# Patient Record
Sex: Female | Born: 1985 | Race: Black or African American | Hispanic: No | Marital: Single | Smoking: Never smoker
Health system: Southern US, Community
[De-identification: ages and names within clinical notes are randomized; demographics above are authoritative.]

## PROBLEM LIST (undated history)

## (undated) DIAGNOSIS — I10 Essential (primary) hypertension: Secondary | ICD-10-CM

---

## 2011-08-14 ENCOUNTER — Emergency Department (HOSPITAL_COMMUNITY): Payer: No Typology Code available for payment source

## 2011-08-14 ENCOUNTER — Other Ambulatory Visit: Payer: Self-pay

## 2011-08-14 ENCOUNTER — Encounter (HOSPITAL_COMMUNITY): Payer: Self-pay | Admitting: Emergency Medicine

## 2011-08-14 ENCOUNTER — Emergency Department (HOSPITAL_COMMUNITY)
Admission: EM | Admit: 2011-08-14 | Discharge: 2011-08-15 | Disposition: A | Discharge status: Home / Self Care | Payer: No Typology Code available for payment source | Attending: Emergency Medicine | Admitting: Emergency Medicine

## 2011-08-14 DIAGNOSIS — R4701 Aphasia: Secondary | ICD-10-CM | POA: Insufficient documentation

## 2011-08-14 DIAGNOSIS — G43109 Migraine with aura, not intractable, without status migrainosus: Secondary | ICD-10-CM | POA: Insufficient documentation

## 2011-08-14 DIAGNOSIS — R29898 Other symptoms and signs involving the musculoskeletal system: Secondary | ICD-10-CM | POA: Insufficient documentation

## 2011-08-14 MED ORDER — ONDANSETRON HCL 4 MG/2ML IJ SOLN
4.0000 mg | Freq: Once | INTRAMUSCULAR | Status: AC
  Administered 2011-08-14: 4 mg via INTRAVENOUS
  Filled 2011-08-14: qty 2

## 2011-08-14 MED ORDER — SODIUM CHLORIDE 0.9 % IV BOLUS (SEPSIS)
1000.0000 mL | INTRAVENOUS | Status: AC
  Administered 2011-08-14: 1000 mL via INTRAVENOUS

## 2011-08-14 MED ORDER — HYDROMORPHONE HCL PF 1 MG/ML IJ SOLN
1.0000 mg | Freq: Once | INTRAMUSCULAR | Status: AC
  Administered 2011-08-14: 1 mg via INTRAVENOUS
  Filled 2011-08-14: qty 1

## 2011-08-14 MED ORDER — SODIUM CHLORIDE 0.9 % IV SOLN: Freq: Once | INTRAVENOUS | Status: DC

## 2011-08-14 NOTE — ED Notes (Signed)
Patient complaining of slurred speech x 2 days. Also complaining of right lower extremity weakness x 2 days.

## 2011-08-14 NOTE — ED Provider Notes (Signed)
History  This chart was scribed for [employer]. Colon Branch, MD by Bennett Scrape. This patient was seen in room APA04/APA04 and the patient's care was started at 11:11PM.  CSN: [id number]  Arrival date & time 08/14/11  2253   First MD Initiated Contact with Patient 08/14/11 2311      Chief Complaint  Patient presents with  . Aphasia  . Extremity Weakness    The history is provided by the patient. No language interpreter was used.    Amanda Reyes is a 26 y.o. female with no h/o chronic medical conditions who presents to the Emergency Department complaining of sudden onset, gradually improving, intermittent body spasms in her right hand and right leg with slurred speech that occurred 2 days ago for approximately 3 hours. Pt states that she could feel her muscles pulling up into a bowed position. She states that she still feels weak. She c/o HA and states that the HA started right after the spasms begin on the left side with associated nausea and chills. She denies nausea and chills currently. She states that she doesn't get HAs on a regular basis and denies similar symptoms with prior HAs. She rates her HA currently a 5 out of 10 but states that the HA has become less severe since the onset. She reports taking ibuprofen. She states that her vision was blurred today. She denies vision change and slurred speech currently. She states that she came in because she is still feeling the pulling sensation and still feels like she is having occasional slurred speech.  She denies fever, emesis and diarrhea as associated symptoms. She denies smoking and alcohol use.  No PCP.  History reviewed. No pertinent past medical history.  History reviewed. No pertinent past surgical history.  History reviewed. No pertinent family history.  History  Substance Use Topics  . Smoking status: Never Smoker   . Smokeless tobacco: Not on file  . Alcohol Use: No     Review of Systems  A complete 10 system review of  systems was obtained and all systems are negative except as noted in the HPI and PMH.   Allergies  Penicillins  Home Medications  No current outpatient prescriptions on file.  Triage Vitals: BP 150/94  Pulse 94  Temp 98.2 F (36.8 C)  Resp 18  Ht 5\' 8"  (1.727 m)  Wt 320 lb (145.151 kg)  BMI 48.66 kg/m2  SpO2 100%  LMP 07/29/2011  Physical Exam  Nursing note and vitals reviewed. Constitutional: She is oriented to person, place, and time. She appears well-developed and well-nourished. No distress.  HENT:  Head: Normocephalic and atraumatic.  Eyes: Conjunctivae and EOM are normal. Pupils are equal, round, and reactive to light.  Neck: Normal range of motion. Neck supple. No tracheal deviation present.  Cardiovascular: Normal rate and regular rhythm.  Exam reveals no gallop and no friction rub.   No murmur heard. Pulmonary/Chest: Effort normal and breath sounds normal. No respiratory distress. She has no wheezes. She has no rales.  Abdominal: Soft. She exhibits no distension.  Musculoskeletal: Normal range of motion.       Strength is normal, slightly greater in the right than the left  Neurological: She is alert and oriented to person, place, and time. No cranial nerve deficit.       No facial asymmetry, no sensory loss, no slurred speech, no memory loss  Skin: Skin is warm and dry.  Psychiatric: She has a normal mood and affect. Her behavior is  normal.    ED Course  Procedures (including critical care time)  DIAGNOSTIC STUDIES: Oxygen Saturation is 100% on room air, normal by my interpretation.    COORDINATION OF CARE: 11:26PM-Discussed treatment plan which involves urinalysis with pt and pt agreed to plan. Results for orders placed during the hospital encounter of 08/14/11  URINALYSIS, ROUTINE W REFLEX MICROSCOPIC      Component Value Range   Color, Urine YELLOW  YELLOW    APPearance HAZY (*) CLEAR    Specific Gravity, Urine >1.030 (*) 1.005 - 1.030    pH 6.0  5.0 -  8.0    Glucose, UA NEGATIVE  NEGATIVE (mg/dL)   Hgb urine dipstick NEGATIVE  NEGATIVE    Bilirubin Urine NEGATIVE  NEGATIVE    Ketones, ur NEGATIVE  NEGATIVE (mg/dL)   Protein, ur NEGATIVE  NEGATIVE (mg/dL)   Urobilinogen, UA 0.2  0.0 - 1.0 (mg/dL)   Nitrite NEGATIVE  NEGATIVE    Leukocytes, UA NEGATIVE  NEGATIVE   PREGNANCY, URINE      Component Value Range   Preg Test, Ur NEGATIVE  NEGATIVE   CBC      Component Value Range   WBC 8.4  4.0 - 10.5 (K/uL)   RBC 4.49  3.87 - 5.11 (MIL/uL)   Hemoglobin 11.2 (*) 12.0 - 15.0 (g/dL)   HCT 16.1 (*) 09.6 - 46.0 (%)   MCV 78.0  78.0 - 100.0 (fL)   MCH 24.9 (*) 26.0 - 34.0 (pg)   MCHC 32.0  30.0 - 36.0 (g/dL)   RDW 04.5  40.9 - 81.1 (%)   Platelets 314  150 - 400 (K/uL)  DIFFERENTIAL      Component Value Range   Neutrophils Relative 57  43 - 77 (%)   Neutro Abs 4.8  1.7 - 7.7 (K/uL)   Lymphocytes Relative 38  12 - 46 (%)   Lymphs Abs 3.2  0.7 - 4.0 (K/uL)   Monocytes Relative 4  3 - 12 (%)   Monocytes Absolute 0.4  0.1 - 1.0 (K/uL)   Eosinophils Relative 1  0 - 5 (%)   Eosinophils Absolute 0.1  0.0 - 0.7 (K/uL)   Basophils Relative 0  0 - 1 (%)   Basophils Absolute 0.0  0.0 - 0.1 (K/uL)  BASIC METABOLIC PANEL      Component Value Range   Sodium 138  135 - 145 (mEq/L)   Potassium 3.7  3.5 - 5.1 (mEq/L)   Chloride 102  96 - 112 (mEq/L)   CO2 26  19 - 32 (mEq/L)   Glucose, Bld 103 (*) 70 - 99 (mg/dL)   BUN 12  6 - 23 (mg/dL)   Creatinine, Ser 9.14  0.50 - 1.10 (mg/dL)   Calcium 9.1  8.4 - 78.2 (mg/dL)   GFR calc non Af Amer >90  >90 (mL/min)   GFR calc Af Amer >90  >90 (mL/min)    Date: 08/15/2011   2316  Rate: 84  Rhythm: normal sinus rhythm  QRS Axis: normal  Intervals: normal  ST/T Wave abnormalities: normal  Conduction Disutrbances: none  Narrative Interpretation: unremarkable  Ct Head Wo Contrast  08/15/2011  *RADIOLOGY REPORT*  Clinical Data: Aphasia.  Extremity weakness.  CT HEAD WITHOUT CONTRAST  Technique:   Contiguous axial images were obtained from the base of the skull through the vertex without contrast.  Comparison: No priors.  Findings: No acute intracranial abnormalities.  Specifically, no acute intracranial hemorrhage, no definite acute/subacute cerebral ischemia, no focal  mass, mass effect, hydrocephalus or abnormal intra or extra-axial fluid collections.  No acute displaced skull fractures are identified.  Visualized paranasal sinuses and mastoids are well pneumatized.  IMPRESSION: 1.  No acute intracranial abnormalities.  The appearance of the brain is normal.  Original Report Authenticated By: Florencia Reasons, M.D.        MDM  Patient with headache and associated neurological symptoms c/w complicated migraine. CT negative for acute process. Labs unremarkable. Dx testing d/w pt and family.  Questions answered.  Verb understanding, agreeable to d/c home with outpt f/u. Pt feels improved after observation and/or treatment in ED.Pt stable in ED with no significant deterioration in condition.The patient appears reasonably screened and/or stabilized for discharge and I doubt any other medical condition or other [facility] requiring further screening, evaluation, or treatment in the ED at this time prior to discharge.  I personally performed the services described in this documentation, which was scribed in my presence. The recorded information has been reviewed and considered.   MDM Reviewed: nursing note and vitals Interpretation: CT scan and labs           [employer]. Colon Branch, MD 08/15/11 1610

## 2011-08-15 LAB — DIFFERENTIAL
Basophils Absolute: 0 10*3/uL (ref 0.0–0.1)
Basophils Relative: 0 % (ref 0–1)
Lymphocytes Relative: 38 % (ref 12–46)
Monocytes Absolute: 0.4 10*3/uL (ref 0.1–1.0)
Monocytes Relative: 4 % (ref 3–12)
Neutro Abs: 4.8 10*3/uL (ref 1.7–7.7)
Neutrophils Relative %: 57 % (ref 43–77)

## 2011-08-15 LAB — PREGNANCY, URINE: Preg Test, Ur: NEGATIVE

## 2011-08-15 LAB — CBC
HCT: 35 % — ABNORMAL LOW (ref 36.0–46.0)
Hemoglobin: 11.2 g/dL — ABNORMAL LOW (ref 12.0–15.0)
WBC: 8.4 10*3/uL (ref 4.0–10.5)

## 2011-08-15 LAB — URINALYSIS, ROUTINE W REFLEX MICROSCOPIC
Glucose, UA: NEGATIVE mg/dL
Hgb urine dipstick: NEGATIVE
Leukocytes, UA: NEGATIVE
Specific Gravity, Urine: >1.03 — ABNORMAL HIGH (ref 1.005–1.030)
pH: 6 (ref 5.0–8.0)

## 2011-08-15 LAB — BASIC METABOLIC PANEL
BUN: 12 mg/dL (ref 6–23)
CO2: 26 mEq/L (ref 19–32)
Chloride: 102 mEq/L (ref 96–112)
Creatinine, Ser: 0.85 mg/dL (ref 0.50–1.10)
GFR calc Af Amer: >90 mL/min (ref >90)
Potassium: 3.7 mEq/L (ref 3.5–5.1)

## 2011-08-15 MED ORDER — PROMETHAZINE HCL 25 MG PO TABS: 12.5000 mg | ORAL_TABLET | Freq: Four times a day (QID) | ORAL | Status: DC | PRN

## 2011-08-15 MED ORDER — HYDROCODONE-ACETAMINOPHEN 5-325 MG PO TABS: 1.0000 | ORAL_TABLET | ORAL | Status: AC | PRN

## 2011-08-15 NOTE — Discharge Instructions (Signed)
Drink lots of fluids. Use the pain  and nausea medicine as needed.   Migraine Headache A migraine headache is an intense, throbbing pain on one or both sides of your head. The exact cause of a migraine headache is not always known. A migraine may be caused when nerves in the brain become irritated and release chemicals that cause swelling within blood vessels, causing pain. Many migraine sufferers have a family history of migraines. Before you get a migraine you may or may not get an aura. An aura is a group of symptoms that can predict the beginning of a migraine. An aura may include:  Visual changes such as:   Flashing lights.   Bright spots or zig-zag lines.   Tunnel vision.   Feelings of numbness.   Trouble talking.   Muscle weakness.  SYMPTOMS  Pain on one or both sides of your head.   Pain that is pulsating or throbbing in nature.   Pain that is severe enough to prevent daily activities.   Pain that is aggravated by any daily physical activity.   Nausea (feeling sick to your stomach), vomiting, or both.   Pain with exposure to bright lights, loud noises, or activity.   General sensitivity to bright lights or loud noises.  MIGRAINE TRIGGERS Examples of triggers of migraine headaches include:   Alcohol.   Smoking.   Stress.   It may be related to menses (female menstruation).   Aged cheeses.   Foods or drinks that contain nitrates, glutamate, aspartame, or tyramine.   Lack of sleep.   Chocolate.   Caffeine.   Hunger.   Medications such as nitroglycerine (used to treat chest pain), birth control pills, estrogen, and some blood pressure medications.  DIAGNOSIS  A migraine headache is often diagnosed based on:  Symptoms.   Physical examination.   A computerized X-ray scan (computed tomography, CT) of your head.  TREATMENT  Medications can help prevent migraines if they are recurrent or should they become recurrent. Your caregiver can help you with  a medication or treatment program that will be helpful to you.   Lying down in a dark, quiet room may be helpful.   Keeping a headache diary may help you find a trend as to what may be triggering your headaches.  SEEK IMMEDIATE MEDICAL CARE IF:   You have confusion, personality changes or seizures.   You have headaches that wake you from sleep.   You have an increased frequency in your headaches.   You have a stiff neck.   You have a loss of vision.   You have muscle weakness.   You start losing your balance or have trouble walking.   You feel faint or pass out.  MAKE SURE YOU:   Understand these instructions.   Will watch your condition.   Will get help right away if you are not doing well or get worse.  Document Released: 02/26/2005 Document Revised: 02/15/2011 Document Reviewed: 10/12/2008 [facility] Patient Information 2012 [place], [place].

## 2012-08-01 ENCOUNTER — Inpatient Hospital Stay (HOSPITAL_COMMUNITY)
Admission: EM | Admit: 2012-08-01 | Discharge: 2012-08-03 | DRG: 445 | Disposition: A | Discharge status: Home / Self Care | Payer: No Typology Code available for payment source | Attending: Internal Medicine | Admitting: Internal Medicine

## 2012-08-01 ENCOUNTER — Emergency Department (HOSPITAL_COMMUNITY): Payer: No Typology Code available for payment source

## 2012-08-01 ENCOUNTER — Encounter (HOSPITAL_COMMUNITY): Payer: Self-pay | Admitting: *Deleted

## 2012-08-01 DIAGNOSIS — Z880 Allergy status to penicillin: Secondary | ICD-10-CM

## 2012-08-01 DIAGNOSIS — Z6841 Body Mass Index (BMI) 40.0 and over, adult: Secondary | ICD-10-CM

## 2012-08-01 DIAGNOSIS — E669 Obesity, unspecified: Secondary | ICD-10-CM

## 2012-08-01 DIAGNOSIS — K8020 Calculus of gallbladder without cholecystitis without obstruction: Principal | ICD-10-CM | POA: Diagnosis present

## 2012-08-01 DIAGNOSIS — K8050 Calculus of bile duct without cholangitis or cholecystitis without obstruction: Secondary | ICD-10-CM

## 2012-08-01 DIAGNOSIS — R7989 Other specified abnormal findings of blood chemistry: Secondary | ICD-10-CM | POA: Diagnosis present

## 2012-08-01 LAB — URINALYSIS, ROUTINE W REFLEX MICROSCOPIC
Nitrite: NEGATIVE
Protein, ur: 30 mg/dL — AB
Urobilinogen, UA: 4 mg/dL — ABNORMAL HIGH (ref 0.0–1.0)

## 2012-08-01 LAB — CBC
HCT: 36.6 % (ref 36.0–46.0)
Hemoglobin: 11.9 g/dL — ABNORMAL LOW (ref 12.0–15.0)
MCV: 78.9 fL (ref 78.0–100.0)
RDW: 15.5 % (ref 11.5–15.5)
WBC: 4.7 10*3/uL (ref 4.0–10.5)

## 2012-08-01 LAB — COMPREHENSIVE METABOLIC PANEL
Alkaline Phosphatase: 377 U/L — ABNORMAL HIGH (ref 39–117)
BUN: 9 mg/dL (ref 6–23)
Chloride: 100 mEq/L (ref 96–112)
Creatinine, Ser: 0.84 mg/dL (ref 0.50–1.10)
GFR calc Af Amer: >90 mL/min (ref >90)
GFR calc non Af Amer: >90 mL/min (ref >90)
Glucose, Bld: 100 mg/dL — ABNORMAL HIGH (ref 70–99)
Potassium: 3.9 mEq/L (ref 3.5–5.1)
Total Bilirubin: 2.8 mg/dL — ABNORMAL HIGH (ref 0.3–1.2)

## 2012-08-01 LAB — TROPONIN I: Troponin I: <0.3 ng/mL (ref <0.30)

## 2012-08-01 LAB — LIPASE, BLOOD: Lipase: 35 U/L (ref 11–59)

## 2012-08-01 LAB — URINE MICROSCOPIC-ADD ON

## 2012-08-01 MED ORDER — ONDANSETRON HCL 4 MG PO TABS
4.0000 mg | ORAL_TABLET | Freq: Four times a day (QID) | ORAL | Status: DC | PRN
  Filled 2012-08-01: qty 1

## 2012-08-01 MED ORDER — ONDANSETRON HCL 4 MG/2ML IJ SOLN
4.0000 mg | Freq: Four times a day (QID) | INTRAMUSCULAR | Status: DC | PRN
  Administered 2012-08-02 (×2): 4 mg via INTRAVENOUS
  Filled 2012-08-01 (×2): qty 2

## 2012-08-01 MED ORDER — SODIUM CHLORIDE 0.9 % IV BOLUS (SEPSIS)
1000.0000 mL | Freq: Once | INTRAVENOUS | Status: AC
  Administered 2012-08-01: 1000 mL via INTRAVENOUS

## 2012-08-01 MED ORDER — ONDANSETRON HCL 4 MG/2ML IJ SOLN
4.0000 mg | Freq: Once | INTRAMUSCULAR | Status: AC
  Administered 2012-08-01: 4 mg via INTRAVENOUS
  Filled 2012-08-01 (×2): qty 2

## 2012-08-01 MED ORDER — SODIUM CHLORIDE 0.9 % IV SOLN
INTRAVENOUS | Status: DC
  Administered 2012-08-02 (×3): via INTRAVENOUS

## 2012-08-01 MED ORDER — HYDROMORPHONE HCL PF 1 MG/ML IJ SOLN
1.0000 mg | Freq: Once | INTRAMUSCULAR | Status: AC
  Administered 2012-08-01: 1 mg via INTRAVENOUS
  Filled 2012-08-01 (×2): qty 1

## 2012-08-01 MED ORDER — MORPHINE SULFATE 2 MG/ML IJ SOLN
2.0000 mg | INTRAMUSCULAR | Status: DC | PRN
  Administered 2012-08-02 – 2012-08-03 (×3): 2 mg via INTRAVENOUS
  Filled 2012-08-01 (×3): qty 1

## 2012-08-01 MED ORDER — DEXTROSE 5 % IV SOLN
1.0000 g | INTRAVENOUS | Status: DC
  Administered 2012-08-01 – 2012-08-02 (×2): 1 g via INTRAVENOUS
  Filled 2012-08-01 (×3): qty 10

## 2012-08-01 MED ORDER — HEPARIN SODIUM (PORCINE) 5000 UNIT/ML IJ SOLN
5000.0000 [IU] | Freq: Three times a day (TID) | INTRAMUSCULAR | Status: DC
  Administered 2012-08-01: 5000 [IU] via SUBCUTANEOUS
  Filled 2012-08-01 (×2): qty 1

## 2012-08-01 NOTE — Consult Note (Signed)
Referring Provider: No ref. provider found Primary Care Physician:  No PCP Per Patient Primary Gastroenterologist:  Dr. Karilyn Cota  Reason for Consultation:  Possible choledocholithiasis.  HPI: Patient is 27 year old female who was admitted to Dr. Karilyn Cota service via emergency room where she presented earlier today with five-day history of subcostal pain radiating to the infrascapular region on the right side. Patient first experienced this pain in December 2013 when she was evaluated in emergency room at [facility] in [place]. Her LFTs are elevated. She says her ultrasound was negative for cholelithiasis. She did not followup with her primary care physician. She was fine until 07/27/2012 when she noted right supposed pain laterally radiating to right and for scapular region. Initially she thought it was gas. Since then pain has been vaccinated Kathrynn Running that she had severe pain last night. Yesterday she also noted her urine to be orange. She did not experience fever chills nausea vomiting or epigastric pain. She also denies melena or rectal bleeding or hematuria. On evaluation in emergency room she was noted to have elevated bilirubin and LFTs and ultrasound showed cholelithiasis mildly dilated bile duct measuring 9 mm. Dr. Lovell Sheehan of surgical service was consulted who requested GI consultation for ERCP prior to cholecystectomy. Patient does not take any medications on regular basis. She denies chronic heartburn or dysphagia. She is trying to lose weight. She says her peak weight last year was 350 pounds and now she's down to 330.     Prior to Admission medications   Not on File  Past medical history; Negative other than obesity.  Current Facility-Administered Medications  Medication Dose Route Frequency Provider Last Rate Last Dose  . 0.9 %  sodium chloride infusion   Intravenous Continuous Nimish C Gosrani, MD      . cefTRIAXone (ROCEPHIN) 1 g in dextrose 5 % 50 mL IVPB   1 g Intravenous Q24H Suzi Roots, MD 100 mL/hr at 08/01/12 1924 1 g at 08/01/12 1924  . morphine 2 MG/ML injection 2 mg  2 mg Intravenous Q4H PRN Nimish C Gosrani, MD      . ondansetron (ZOFRAN) tablet 4 mg  4 mg Oral Q6H PRN Nimish C Gosrani, MD       Or  . ondansetron (ZOFRAN) injection 4 mg  4 mg Intravenous Q6H PRN Wilson Singer, MD        Allergies as of 08/01/2012 - Review Complete 08/01/2012  Allergen Reaction Noted  . Penicillins Hives 08/14/2011   family history; Both parents are in good health and so is one brother.    History   Social History  . Marital Status: Single    Spouse Name: N/A    Number of Children: N/A  . Years of Education: N/A   Occupational History  . Not on file.   Social History Main Topics  . Smoking status: Never Smoker   . Smokeless tobacco: Not on file  . Alcohol Use: No  . Drug Use: No  . Sexually Active: No   Other Topics Concern  . Not on file   Social History Narrative  . No narrative on file    Review of Systems: See HPI, otherwise normal ROS  Physical Exam: Temp:  [97.4 F (36.3 C)-97.9 F (36.6 C)] 97.9 F (36.6 C) (05/23 2024) Pulse Rate:  [72-80] 72 (05/23 2024) Resp:  [16-20] 20 (05/23 2024) BP: (136-154)/(84-109) 136/84 mmHg (05/23 2024) SpO2:  [97 %-100 %] 97 % (05/23 2024) Weight:  [230 lb (104.327  kg)-349 lb 13.9 oz (158.7 kg)] 349 lb 13.9 oz (158.7 kg) (05/23 2024)   Pleasant well-developed obese African American female who is in no acute distress. Conjunctiva is pink sclerae does not appear to be icteric. Oropharyngeal mucosa is normal. No neck masses or thyromegaly are noted. Cardiac exam with regular rhythm normal S1 and S2. No murmur or gallop noted. Lungs are clear to auscultation. Abdomen is obese but soft and nontender without organomegaly or masses. Peripheral edema or clubbing noted.    Lab Results:  Recent Labs  08/01/12 1400  WBC 4.7  HGB 11.9*  HCT 36.6  PLT 298   BMET  Recent  Labs  08/01/12 1400  NA 138  K 3.9  CL 100  CO2 28  GLUCOSE 100*  BUN 9  CREATININE 0.84  CALCIUM 9.1   LFT  Recent Labs  08/01/12 1400  PROT 7.5  ALBUMIN 3.6  AST 395*  ALT 657*  ALKPHOS 377*  BILITOT 2.8*   PT/INR No results found for this basename: LABPROT, INR,  in the last 72 hours Hepatitis Panel No results found for this basename: HEPBSAG, HCVAB, HEPAIGM, HEPBIGM,  in the last 72 hours  Studies/Results: Dg Chest 2 View  08/01/2012   *RADIOLOGY REPORT*  Clinical Data: Chest pain through to her back for 2-day  CHEST - 2 VIEW  Comparison: It is none  Findings: Enlargement of cardiac silhouette. Mediastinal contours and pulmonary vascularity normal. Minimal subsegmental atelectasis at right base. Lungs otherwise clear. No pleural effusion or pneumothorax. Bones unremarkable.  IMPRESSION: Enlargement of cardiac silhouette. Minimal right basilar atelectasis.   Original Report Authenticated By: Ulyses Southward, M.D.   US Abdomen Limited Ruq  08/01/2012   *RADIOLOGY REPORT*  Clinical Data:  Gallstones, right upper quadrant pain  LIMITED ABDOMINAL ULTRASOUND - RIGHT UPPER QUADRANT  Comparison:  None.  Findings:  Gallbladder:  Multiple echogenic foci with posterior acoustic shadowing are noted within the gallbladder consistent with cholelithiasis.  No gallbladder wall thickening or pericholecystic fluid.  Per the sonographer the sonographic Murphy's sign was negative.  Common bile duct:  The common bile duct is mildly dilated at 9 mm in caliber.  Liver:  Unremarkable sonographic appearance of the liver.  IMPRESSION:  1.  Cholelithiasis without sonographic findings to suggest acute cholecystitis.  2.  Borderline enlarged common bile duct at 9 mm in diameter.  No choledocholithiasis seen within the imaged portions.  The distal common bile duct is not well seen secondary to obscuring bowel gas.                    Original Report Authenticated By: Malachy Moan, M.D.     Assessment; Patient is 27 year old female who presents with subcostal and infrascapular pain of 5 days' duration. She has elevated bilirubin and transaminases. Ultrasound reveals cholelithiasis and dilated bile duct measuring 9 mm. Patient's acute symptoms appear to be secondary to choledocholithiasis. She will need ERCP to clear her duct of stones prior to laparoscopic cholecystectomy.  Recommendations; ERCP with biliary sphincterotomy and stone extraction to be performed in a.m. Pancreatic stenting only if CBD cannulation is difficult. Will need to hold heparin and use mechanical VTE prophylaxis. She will not be able to go back on heparin or aspirin for 72 hours after procedure. I reviewed procedure and risks with the patient in detail and she is agreeable. We'll also obtain baseline INR and repeat lab in a.m.   LOS: 0 days   Kailene Steinhart U  08/01/2012, 9:56 PM

## 2012-08-01 NOTE — H&P (Signed)
[facility] History and Physical  Amanda Reyes WUJ:[id number] DOB: May 24, 1985 DOA: 08/01/2012  Referring physician: Dr Denton Lank PCP: No PCP Per Patient    Chief Complaint: Right-sided abdominal pain.  HPI: Amanda Reyes is a 27 y.o. female complains of right-sided abdominal pain which started approximately 4 days ago. The pain has been fairly intermittent, radiates to the right back. She has not had any significant nausea with it. There's been no fever. When she presented to the emergency room, she had elevated liver function tests and ultrasound showed the presence of gallstones with dilated common bile duct. She is now being referred for remission.   Review of Systems:  Apart from history of present illness, other systems negative.  History reviewed. No pertinent past medical history. History reviewed. No pertinent past surgical history. Social History:  She lives alone, she is single. She works at a [employer]. She does not smoke cigarettes. She does not drink alcohol. She is fully independent.  Allergies  Allergen Reactions  . Penicillins Hives    No family history on file. there is no family history of gallbladder disease.  Prior to Admission medications   Not on File   Physical Exam: Filed Vitals:   08/01/12 1233  BP: 154/109  Pulse: 80  Temp: 97.4 F (36.3 C)  TempSrc: Oral  Resp: 16  Height: 5\' 7"  (1.702 m)  Weight: 104.327 kg (230 lb)  SpO2: 100%     General:  She looks systemically well. She is not toxic or septic. She is obese.  Eyes: No pallor. No jaundice.  ENT: No abnormalities.  Neck: No lymphadenopathy.  Cardiovascular: Heart sounds are present and normal without murmurs or added sounds.  Respiratory: Lung fields are clear.  Abdomen: Soft, nontender, in particular she is nontender in the right upper quadrant.  Skin: No rash.  Musculoskeletal: No acute joint abnormalities.  Psychiatric: Appropriate affect.  Neurologic: Alert and  orientated without any focal neurological signs.  Labs on Admission:  Basic Metabolic Panel:  Recent Labs Lab 08/01/12 1400  NA 138  K 3.9  CL 100  CO2 28  GLUCOSE 100*  BUN 9  CREATININE 0.84  CALCIUM 9.1   Liver Function Tests:  Recent Labs Lab 08/01/12 1400  AST 395*  ALT 657*  ALKPHOS 377*  BILITOT 2.8*  PROT 7.5  ALBUMIN 3.6    Recent Labs Lab 08/01/12 1400  LIPASE 35    CBC:  Recent Labs Lab 08/01/12 1400  WBC 4.7  HGB 11.9*  HCT 36.6  MCV 78.9  PLT 298   Cardiac Enzymes:  Recent Labs Lab 08/01/12 1400  TROPONINI <0.30     Radiological Exams on Admission: Dg Chest 2 View  08/01/2012   *RADIOLOGY REPORT*  Clinical Data: Chest pain through to her back for 2-day  CHEST - 2 VIEW  Comparison: It is none  Findings: Enlargement of cardiac silhouette. Mediastinal contours and pulmonary vascularity normal. Minimal subsegmental atelectasis at right base. Lungs otherwise clear. No pleural effusion or pneumothorax. Bones unremarkable.  IMPRESSION: Enlargement of cardiac silhouette. Minimal right basilar atelectasis.   Original Report Authenticated By: Ulyses Southward, M.D.   US Abdomen Limited Ruq  08/01/2012   *RADIOLOGY REPORT*  Clinical Data:  Gallstones, right upper quadrant pain  LIMITED ABDOMINAL ULTRASOUND - RIGHT UPPER QUADRANT  Comparison:  None.  Findings:  Gallbladder:  Multiple echogenic foci with posterior acoustic shadowing are noted within the gallbladder consistent with cholelithiasis.  No gallbladder wall thickening or pericholecystic fluid.  Per  the sonographer the sonographic Murphy's sign was negative.  Common bile duct:  The common bile duct is mildly dilated at 9 mm in caliber.  Liver:  Unremarkable sonographic appearance of the liver.  IMPRESSION:  1.  Cholelithiasis without sonographic findings to suggest acute cholecystitis.  2.  Borderline enlarged common bile duct at 9 mm in diameter.  No choledocholithiasis seen within the imaged portions.   The distal common bile duct is not well seen secondary to obscuring bowel gas.                    Original Report Authenticated By: Malachy Moan, M.D.      Assessment/Plan   1. Cholelithiasis, acute attack. No clinical or radiological evidence of cholecystitis. 2. Obesity.  Plan: 1. Admit to medical floor. 2. Analgesia as required. 3. Clear liquid diet. Intravenous fluids. 4. Surgical and gastroenterological consultations. Further recommendations will depend on patient's hospital progress.  Code Status: Full code.   Family Communication: Discussed plan with patient at the bedside.   Disposition Plan: Home when medically stable.   Time spent: 30 minutes.  Wilson Singer [facility] Pager [phone].  If 7PM-7AM, please contact night-coverage [email or web address] Password [facility] 08/01/2012, 4:34 PM     \

## 2012-08-01 NOTE — ED Provider Notes (Addendum)
History     CSN: [id number]  Arrival date & time 08/01/12  1215   First MD Initiated Contact with Patient 08/01/12 1318      Chief Complaint  Patient presents with  . Flank Pain  . Chest Pain    (Consider location/radiation/quality/duration/timing/severity/associated sxs/prior treatment) HPI Pt c/oruq/costal margin area pain for the past 3-4 days. Constant. Dull. Occasionally radiates to back/mid back. Not pleuritic. At rest. No specific exacerbating or alleviating factors. No associate sob, unusual doe, fatigue, nv, or diaphoresis. No cough or uri c/o. No fever or chills.  Denies hx same pain. No hx gallstones, pud or pancreatitis. No recent immobility, trauma, surgery, travel, leg pain/swelling, cough or hemoptysis, sob, ocp use, tobacco, or hx dvt or pe. Pt denies trauma to area.     History reviewed. No pertinent past medical history.  History reviewed. No pertinent past surgical history.  No family history on file.  History  Substance Use Topics  . Smoking status: Never Smoker   . Smokeless tobacco: Not on file  . Alcohol Use: No    OB History   Grav Para Term Preterm Abortions TAB SAB Ect Mult Living                  Review of Systems  Constitutional: Negative for fever and chills.  HENT: Negative for neck pain.   Eyes: Negative for redness.  Respiratory: Negative for cough and shortness of breath.   Cardiovascular: Negative for chest pain and leg swelling.  Gastrointestinal: Positive for abdominal pain. Negative for vomiting, diarrhea and constipation.  Genitourinary: Negative for dysuria, hematuria and flank pain.  Musculoskeletal: Negative for back pain.  Skin: Negative for rash.  Neurological: Negative for headaches.  Hematological: Does not bruise/bleed easily.  Psychiatric/Behavioral: Negative for confusion.    Allergies  Penicillins  Home Medications  No current outpatient prescriptions on file.  BP 154/109  Pulse 80  Temp(Src) 97.4 F (36.3  C) (Oral)  Resp 16  Ht 5\' 7"  (1.702 m)  Wt 230 lb (104.327 kg)  BMI 36.01 kg/m2  SpO2 100%  LMP 08/01/2012  Physical Exam  Nursing note and vitals reviewed. Constitutional: She is oriented to person, place, and time. She appears well-developed and well-nourished. No distress.  HENT:  Mouth/Throat: Oropharynx is clear and moist.  Eyes: Conjunctivae are normal. No scleral icterus.  Neck: Neck supple. No tracheal deviation present.  Cardiovascular: Normal rate, regular rhythm, normal heart sounds and intact distal pulses.   Pulmonary/Chest: Effort normal and breath sounds normal. No respiratory distress. She exhibits tenderness.  Abdominal: Soft. Normal appearance and bowel sounds are normal. She exhibits no distension and no mass. There is no rebound and no guarding.  Morbidly obese. Mild ruq tenderness.   Genitourinary:  No cva tenderness  Musculoskeletal: She exhibits no edema and no tenderness.  Neurological: She is alert and oriented to person, place, and time.  Skin: Skin is warm and dry. No rash noted. She is not diaphoretic.  Psychiatric: She has a normal mood and affect.    ED Course  Procedures (including critical care time)   Results for orders placed during the hospital encounter of 08/01/12  CBC      Result Value Range   WBC 4.7  4.0 - 10.5 K/uL   RBC 4.64  3.87 - 5.11 MIL/uL   Hemoglobin 11.9 (*) 12.0 - 15.0 g/dL   HCT 40.9  81.1 - 91.4 %   MCV 78.9  78.0 - 100.0 fL  MCH 25.6 (*) 26.0 - 34.0 pg   MCHC 32.5  30.0 - 36.0 g/dL   RDW 16.1  09.6 - 04.5 %   Platelets 298  150 - 400 K/uL  COMPREHENSIVE METABOLIC PANEL      Result Value Range   Sodium 138  135 - 145 mEq/L   Potassium 3.9  3.5 - 5.1 mEq/L   Chloride 100  96 - 112 mEq/L   CO2 28  19 - 32 mEq/L   Glucose, Bld 100 (*) 70 - 99 mg/dL   BUN 9  6 - 23 mg/dL   Creatinine, Ser 4.09  0.50 - 1.10 mg/dL   Calcium 9.1  8.4 - 81.1 mg/dL   Total Protein 7.5  6.0 - 8.3 g/dL   Albumin 3.6  3.5 - 5.2 g/dL    AST 914 (*) 0 - 37 U/L   ALT 657 (*) 0 - 35 U/L   Alkaline Phosphatase 377 (*) 39 - 117 U/L   Total Bilirubin 2.8 (*) 0.3 - 1.2 mg/dL   GFR calc non Af Amer >90  >90 mL/min   GFR calc Af Amer >90  >90 mL/min  LIPASE, BLOOD      Result Value Range   Lipase 35  11 - 59 U/L  TROPONIN I      Result Value Range   Troponin I <0.30  <0.30 ng/mL  URINALYSIS, ROUTINE W REFLEX MICROSCOPIC      Result Value Range   Color, Urine ORANGE (*) YELLOW   APPearance CLEAR  CLEAR   Specific Gravity, Urine 1.020  1.005 - 1.030   pH 7.5  5.0 - 8.0   Glucose, UA 100 (*) NEGATIVE mg/dL   Hgb urine dipstick LARGE (*) NEGATIVE   Bilirubin Urine LARGE (*) NEGATIVE   Ketones, ur TRACE (*) NEGATIVE mg/dL   Protein, ur 30 (*) NEGATIVE mg/dL   Urobilinogen, UA 4.0 (*) 0.0 - 1.0 mg/dL   Nitrite NEGATIVE  NEGATIVE   Leukocytes, UA NEGATIVE  NEGATIVE  PREGNANCY, URINE      Result Value Range   Preg Test, Ur NEGATIVE  NEGATIVE  URINE MICROSCOPIC-ADD ON      Result Value Range   Squamous Epithelial / LPF RARE  RARE   RBC / HPF TOO NUMEROUS TO COUNT  <3 RBC/hpf   Dg Chest 2 View  08/01/2012   *RADIOLOGY REPORT*  Clinical Data: Chest pain through to her back for 2-day  CHEST - 2 VIEW  Comparison: It is none  Findings: Enlargement of cardiac silhouette. Mediastinal contours and pulmonary vascularity normal. Minimal subsegmental atelectasis at right base. Lungs otherwise clear. No pleural effusion or pneumothorax. Bones unremarkable.  IMPRESSION: Enlargement of cardiac silhouette. Minimal right basilar atelectasis.   Original Report Authenticated By: Ulyses Southward, M.D.   US Abdomen Limited Ruq  08/01/2012   *RADIOLOGY REPORT*  Clinical Data:  Gallstones, right upper quadrant pain  LIMITED ABDOMINAL ULTRASOUND - RIGHT UPPER QUADRANT  Comparison:  None.  Findings:  Gallbladder:  Multiple echogenic foci with posterior acoustic shadowing are noted within the gallbladder consistent with cholelithiasis.  No gallbladder wall  thickening or pericholecystic fluid.  Per the sonographer the sonographic Murphy's sign was negative.  Common bile duct:  The common bile duct is mildly dilated at 9 mm in caliber.  Liver:  Unremarkable sonographic appearance of the liver.  IMPRESSION:  1.  Cholelithiasis without sonographic findings to suggest acute cholecystitis.  2.  Borderline enlarged common bile duct at 9 mm in  diameter.  No choledocholithiasis seen within the imaged portions.  The distal common bile duct is not well seen secondary to obscuring bowel gas.                    Original Report Authenticated By: Malachy Moan, M.D.      MDM  Labs.   Date: 08/01/2012  Rate: 74  Rhythm: normal sinus rhythm  QRS Axis: normal  Intervals: normal  ST/T Wave abnormalities: normal  Conduction Disutrbances:none  Narrative Interpretation:   Old EKG Reviewed: unchanged  Pt drove self to ed, narcotic pain med held.    [place] w gallstones and dil duct, lfts elev c/w bil obstruction.  Discussed w gen surgery, Dr Lovell Sheehan, he will consult, but requests gi be called re possible ercp.  discused w GI, Dr Karilyn Cota, he will see, requests pt be started on ceph abx, tentatively plans to do ercp tomorrow morning.   Discussed w hospitalist, Dr Karilyn Cota, he will admit.   Dilaudid 1 mg iv.   Recheck pain improved. Mild ruq tenderness.    Suzi Roots, MD 08/01/12 1723  Suzi Roots, MD 08/01/12 [phone]

## 2012-08-01 NOTE — ED Notes (Signed)
Pt states right flank pain began Sunday. States chest tightness began yesterday. States when flank begins to hurt, tightness of chest occurs also. NAD.

## 2012-08-01 NOTE — ED Notes (Signed)
GI MD at bedside. Unable to obtain IV access. Hospitalist paged to inform him and was instructed to call on-call surgeon for possible central line placement.

## 2012-08-01 NOTE — ED Notes (Signed)
Was told to call ICU nurses to try. They stated they were unable due to only being two of them. Currently calling OR to see if PICC line nurse is still in the building.

## 2012-08-01 NOTE — ED Notes (Signed)
EMD currently at bedside attempting IV with [place]. Report given to Ashely, RN

## 2012-08-02 ENCOUNTER — Encounter (HOSPITAL_COMMUNITY)
Admission: EM | Disposition: A | Discharge status: Home / Self Care | Payer: Self-pay | Source: Home / Self Care | Attending: Internal Medicine

## 2012-08-02 ENCOUNTER — Inpatient Hospital Stay (HOSPITAL_COMMUNITY): Payer: No Typology Code available for payment source

## 2012-08-02 ENCOUNTER — Encounter (HOSPITAL_COMMUNITY): Payer: Self-pay | Admitting: Anesthesiology

## 2012-08-02 ENCOUNTER — Inpatient Hospital Stay (HOSPITAL_COMMUNITY): Payer: No Typology Code available for payment source | Admitting: Anesthesiology

## 2012-08-02 HISTORY — PX: STONE EXTRACTION WITH BASKET: SHX5318

## 2012-08-02 HISTORY — PX: ERCP: SHX5425

## 2012-08-02 HISTORY — PX: PANCREATIC STENT PLACEMENT: SHX5539

## 2012-08-02 HISTORY — PX: SPHINCTEROTOMY: SHX5544

## 2012-08-02 LAB — PROTIME-INR: INR: 1 (ref 0.00–1.49)

## 2012-08-02 LAB — COMPREHENSIVE METABOLIC PANEL
ALT: 530 U/L — ABNORMAL HIGH (ref 0–35)
Calcium: 8.8 mg/dL (ref 8.4–10.5)
Creatinine, Ser: 0.79 mg/dL (ref 0.50–1.10)
GFR calc Af Amer: >90 mL/min (ref >90)
Glucose, Bld: 96 mg/dL (ref 70–99)
Sodium: 137 mEq/L (ref 135–145)
Total Protein: 6.4 g/dL (ref 6.0–8.3)

## 2012-08-02 LAB — CBC
HCT: 33.8 % — ABNORMAL LOW (ref 36.0–46.0)
Hemoglobin: 11 g/dL — ABNORMAL LOW (ref 12.0–15.0)
MCHC: 32.5 g/dL (ref 30.0–36.0)
RBC: 4.28 MIL/uL (ref 3.87–5.11)
WBC: 4.9 10*3/uL (ref 4.0–10.5)

## 2012-08-02 LAB — HEMOGLOBIN A1C: Mean Plasma Glucose: 103 mg/dL (ref <117)

## 2012-08-02 SURGERY — ERCP, WITH INTERVENTION IF INDICATED
Anesthesia: General

## 2012-08-02 SURGERY — CANCELLED PROCEDURE

## 2012-08-02 MED ORDER — ACETAMINOPHEN 325 MG PO TABS: 650.0000 mg | ORAL_TABLET | Freq: Four times a day (QID) | ORAL | Status: DC | PRN

## 2012-08-02 MED ORDER — ROCURONIUM BROMIDE 100 MG/10ML IV SOLN
INTRAVENOUS | Status: DC | PRN
  Administered 2012-08-02: 25 mg via INTRAVENOUS
  Administered 2012-08-02: 5 mg via INTRAVENOUS

## 2012-08-02 MED ORDER — LACTATED RINGERS IV SOLN
INTRAVENOUS | Status: DC | PRN
  Administered 2012-08-02: 09:00:00 via INTRAVENOUS

## 2012-08-02 MED ORDER — SODIUM CHLORIDE 0.9 % IV SOLN
INTRAVENOUS | Status: DC | PRN
  Administered 2012-08-02: 10:00:00

## 2012-08-02 MED ORDER — GLYCOPYRROLATE 0.2 MG/ML IJ SOLN
INTRAMUSCULAR | Status: AC
  Filled 2012-08-02: qty 1

## 2012-08-02 MED ORDER — INDOMETHACIN 50 MG RE SUPP
100.0000 mg | Freq: Once | RECTAL | Status: AC
  Administered 2012-08-02: 100 mg via RECTAL

## 2012-08-02 MED ORDER — GLYCOPYRROLATE 0.2 MG/ML IJ SOLN
INTRAMUSCULAR | Status: AC
  Filled 2012-08-02: qty 3

## 2012-08-02 MED ORDER — PROPOFOL 10 MG/ML IV BOLUS
INTRAVENOUS | Status: DC | PRN
  Administered 2012-08-02: 200 mg via INTRAVENOUS

## 2012-08-02 MED ORDER — ROCURONIUM BROMIDE 50 MG/5ML IV SOLN
INTRAVENOUS | Status: AC
  Filled 2012-08-02: qty 1

## 2012-08-02 MED ORDER — LIDOCAINE HCL (CARDIAC) 20 MG/ML IV SOLN
INTRAVENOUS | Status: DC | PRN
  Administered 2012-08-02: 40 mg via INTRAVENOUS

## 2012-08-02 MED ORDER — ONDANSETRON HCL 4 MG/2ML IJ SOLN
INTRAMUSCULAR | Status: DC | PRN
  Administered 2012-08-02: 4 mg via INTRAVENOUS

## 2012-08-02 MED ORDER — LIDOCAINE HCL (PF) 1 % IJ SOLN
INTRAMUSCULAR | Status: AC
  Filled 2012-08-02: qty 5

## 2012-08-02 MED ORDER — ONDANSETRON HCL 4 MG/2ML IJ SOLN
INTRAMUSCULAR | Status: AC
  Filled 2012-08-02: qty 2

## 2012-08-02 MED ORDER — INDOMETHACIN 50 MG RE SUPP
RECTAL | Status: AC
  Filled 2012-08-02: qty 1

## 2012-08-02 MED ORDER — GLYCOPYRROLATE 0.2 MG/ML IJ SOLN
INTRAMUSCULAR | Status: DC | PRN
  Administered 2012-08-02: 0.2 mg via INTRAVENOUS
  Administered 2012-08-02: .4 mg via INTRAVENOUS

## 2012-08-02 MED ORDER — GLUCAGON HCL (RDNA) 1 MG IJ SOLR
INTRAMUSCULAR | Status: DC | PRN
  Administered 2012-08-02: 0.25 mg via INTRAVENOUS

## 2012-08-02 MED ORDER — FENTANYL CITRATE 0.05 MG/ML IJ SOLN
INTRAMUSCULAR | Status: AC
  Filled 2012-08-02: qty 5

## 2012-08-02 MED ORDER — STERILE WATER FOR IRRIGATION IR SOLN
Status: DC | PRN
  Administered 2012-08-02: 10:00:00

## 2012-08-02 MED ORDER — MIDAZOLAM HCL 2 MG/2ML IJ SOLN
INTRAMUSCULAR | Status: AC
  Filled 2012-08-02: qty 2

## 2012-08-02 MED ORDER — SUCCINYLCHOLINE CHLORIDE 20 MG/ML IJ SOLN
INTRAMUSCULAR | Status: DC | PRN
Start: 2012-08-02 — End: 2012-08-02
  Administered 2012-08-02: 150 mg via INTRAVENOUS

## 2012-08-02 MED ORDER — SUCCINYLCHOLINE CHLORIDE 20 MG/ML IJ SOLN
INTRAMUSCULAR | Status: AC
  Filled 2012-08-02: qty 1

## 2012-08-02 MED ORDER — PROPOFOL 10 MG/ML IV EMUL
INTRAVENOUS | Status: AC
  Filled 2012-08-02: qty 20

## 2012-08-02 MED ORDER — PHENOL 1.4 % MT LIQD
1.0000 | OROMUCOSAL | Status: DC | PRN
  Administered 2012-08-02: 1 via OROMUCOSAL
  Filled 2012-08-02: qty 177

## 2012-08-02 MED ORDER — NEOSTIGMINE METHYLSULFATE 1 MG/ML IJ SOLN
INTRAMUSCULAR | Status: DC | PRN
  Administered 2012-08-02: 2 mg via INTRAVENOUS

## 2012-08-02 MED ORDER — FENTANYL CITRATE 0.05 MG/ML IJ SOLN
INTRAMUSCULAR | Status: DC | PRN
  Administered 2012-08-02 (×2): 50 ug via INTRAVENOUS

## 2012-08-02 MED ORDER — MIDAZOLAM HCL 5 MG/5ML IJ SOLN
INTRAMUSCULAR | Status: DC | PRN
  Administered 2012-08-02: 2 mg via INTRAVENOUS

## 2012-08-02 SURGICAL SUPPLY — 25 items
BAG HAMPER (MISCELLANEOUS) ×2 IMPLANT
BALLN RETRIEVAL 12X15 (BALLOONS) ×2 IMPLANT
BASKET TRAPEZOID 3X6 (MISCELLANEOUS) IMPLANT
BASKET TRAPEZOID LITHO 2.5X5 (MISCELLANEOUS) ×2 IMPLANT
DEVICE INFLATION ENCORE 26 (MISCELLANEOUS) IMPLANT
DEVICE LOCKING W-BIOPSY CAP (MISCELLANEOUS) ×2 IMPLANT
GUIDEWIRE HYDRA JAGWIRE .35 (WIRE) ×2 IMPLANT
GUIDEWIRE JAG HINI 025X260CM (WIRE) IMPLANT
KIT ROOM TURNOVER APOR (KITS) ×2 IMPLANT
LUBRICANT JELLY 4.5OZ STERILE (MISCELLANEOUS) IMPLANT
NEEDLE HYPO 18GX1.5 BLUNT FILL (NEEDLE) IMPLANT
PAD ARMBOARD 7.5X6 YLW CONV (MISCELLANEOUS) ×2 IMPLANT
PATHFINDER 450CM 0.18 (STENTS) IMPLANT
POSITIONER HEAD 8X9X4 ADT (SOFTGOODS) IMPLANT
SNARE ROTATE MED OVAL 20MM (MISCELLANEOUS) IMPLANT
SPHINCTEROTOME AUTOTOME .25 (MISCELLANEOUS) ×2 IMPLANT
SPHINCTEROTOME HYDRATOME 44 (MISCELLANEOUS) ×2 IMPLANT
SPONGE GAUZE 4X4 12PLY (GAUZE/BANDAGES/DRESSINGS) ×2 IMPLANT
SYR 3ML LL SCALE MARK (SYRINGE) IMPLANT
SYR 50ML LL SCALE MARK (SYRINGE) ×4 IMPLANT
SYSTEM CONTINUOUS INJECTION (MISCELLANEOUS) ×2 IMPLANT
TUBING ENDO SMARTCAP PENTAX (MISCELLANEOUS) ×2 IMPLANT
WALLSTENT METAL COVERED 10X60 (STENTS) IMPLANT
WALLSTENT METAL COVERED 10X80 (STENTS) IMPLANT
WATER STERILE IRR 1000ML POUR (IV SOLUTION) ×4 IMPLANT

## 2012-08-02 SURGICAL SUPPLY — 24 items
BAG HAMPER (MISCELLANEOUS) IMPLANT
BALLN RETRIEVAL 12X15 (BALLOONS) IMPLANT
BASKET TRAPEZOID 3X6 (MISCELLANEOUS) IMPLANT
DEVICE INFLATION ENCORE 26 (MISCELLANEOUS) IMPLANT
DEVICE LOCKING W-BIOPSY CAP (MISCELLANEOUS) IMPLANT
GUIDEWIRE HYDRA JAGWIRE .35 (WIRE) IMPLANT
GUIDEWIRE JAG HINI 025X260CM (WIRE) IMPLANT
KIT ROOM TURNOVER APOR (KITS) IMPLANT
LUBRICANT JELLY 4.5OZ STERILE (MISCELLANEOUS) IMPLANT
NEEDLE HYPO 18GX1.5 BLUNT FILL (NEEDLE) IMPLANT
PAD ARMBOARD 7.5X6 YLW CONV (MISCELLANEOUS) IMPLANT
PATHFINDER 450CM 0.18 (STENTS) IMPLANT
POSITIONER HEAD 8X9X4 ADT (SOFTGOODS) IMPLANT
SNARE ROTATE MED OVAL 20MM (MISCELLANEOUS) IMPLANT
SPHINCTEROTOME AUTOTOME .25 (MISCELLANEOUS) IMPLANT
SPHINCTEROTOME HYDRATOME 44 (MISCELLANEOUS) IMPLANT
SPONGE GAUZE 4X4 12PLY (GAUZE/BANDAGES/DRESSINGS) IMPLANT
SYR 3ML LL SCALE MARK (SYRINGE) IMPLANT
SYR 50ML LL SCALE MARK (SYRINGE) IMPLANT
SYSTEM CONTINUOUS INJECTION (MISCELLANEOUS) IMPLANT
TUBING ENDO SMARTCAP PENTAX (MISCELLANEOUS) IMPLANT
WALLSTENT METAL COVERED 10X60 (STENTS) IMPLANT
WALLSTENT METAL COVERED 10X80 (STENTS) IMPLANT
WATER STERILE IRR 1000ML POUR (IV SOLUTION) IMPLANT

## 2012-08-02 NOTE — Anesthesia Preprocedure Evaluation (Addendum)
Anesthesia Evaluation  Patient identified by MRN, date of birth, ID band Patient awake    Reviewed: Allergy & Precautions, H&P , NPO status , Patient's Chart, lab work & pertinent test results  History of Anesthesia Complications Negative for: history of anesthetic complications  Airway       Dental   Pulmonary          Cardiovascular negative cardio ROS      Neuro/Psych negative neurological ROS     GI/Hepatic Nausea, but no vomiting with present problem   Endo/Other  Morbid obesity  Renal/GU negative Renal ROS     Musculoskeletal   Abdominal   Peds  Hematology   Anesthesia Other Findings   Reproductive/Obstetrics                          Anesthesia Physical Anesthesia Plan  ASA: II and emergent  Anesthesia Plan: General   Post-op Pain Management:    Induction: Intravenous, Rapid sequence and Cricoid pressure planned  Airway Management Planned: Oral ETT  Additional Equipment:   Intra-op Plan:   Post-operative Plan: Extubation in OR  Informed Consent: I have reviewed the patients History and Physical, chart, labs and discussed the procedure including the risks, benefits and alternatives for the proposed anesthesia with the patient or authorized representative who has indicated his/her understanding and acceptance.   Dental advisory given  Plan Discussed with: Anesthesiologist  Anesthesia Plan Comments: (Telephone consult with Dr. Jayme Cloud agreeable with plan.)        Anesthesia Quick Evaluation

## 2012-08-02 NOTE — Progress Notes (Signed)
Patient has no complaints. She does not have abdominal pain at this time. Sclera is mildly icteric. Bilirubin is up to 3.7, AB 361, AST 228 and ALT 530 INR is 1 Will proceed with ERCP. Patient is agreeable.

## 2012-08-02 NOTE — Progress Notes (Signed)
     Subjective: The patient's pain is somewhat improved today. She has been seen by gastroenterology and is due for ERCP today. She has also been seen by surgery and  will have her cholecystectomy in the next few days .           Physical Exam: Blood pressure 139/95, pulse 79, temperature 98 F (36.7 C), temperature source Oral, resp. rate 20, height 5\' 7"  (1.702 m), weight 158.7 kg (349 lb 13.9 oz), last menstrual period 08/01/2012, SpO2 98.00%. She looks systemically well. She is not toxic or septic. Abdomen is soft and nontender. There is no respiratory distress.   Investigations:  No results found for this or any previous visit (from the past 240 hour(s)).   Basic Metabolic Panel:  Recent Labs  40/98/11 1400 08/02/12 0639  NA 138 137  K 3.9 3.8  CL 100 101  CO2 28 26  GLUCOSE 100* 96  BUN 9 11  CREATININE 0.84 0.79  CALCIUM 9.1 8.8   Liver Function Tests:  Recent Labs  08/01/12 1400 08/02/12 0639  AST 395* 228*  ALT 657* 530*  ALKPHOS 377* 361*  BILITOT 2.8* 3.7*  PROT 7.5 6.4  ALBUMIN 3.6 3.0*     CBC:  Recent Labs  08/01/12 1400 08/02/12 0639  WBC 4.7 4.9  HGB 11.9* 11.0*  HCT 36.6 33.8*  MCV 78.9 79.0  PLT 298 290    Dg Chest 2 View  08/01/2012   *RADIOLOGY REPORT*  Clinical Data: Chest pain through to her back for 2-day  CHEST - 2 VIEW  Comparison: It is none  Findings: Enlargement of cardiac silhouette. Mediastinal contours and pulmonary vascularity normal. Minimal subsegmental atelectasis at right base. Lungs otherwise clear. No pleural effusion or pneumothorax. Bones unremarkable.  IMPRESSION: Enlargement of cardiac silhouette. Minimal right basilar atelectasis.   Original Report Authenticated By: Ulyses Southward, M.D.   US Abdomen Limited Ruq  08/01/2012   *RADIOLOGY REPORT*  Clinical Data:  Gallstones, right upper quadrant pain  LIMITED ABDOMINAL ULTRASOUND - RIGHT UPPER QUADRANT  Comparison:  None.  Findings:  Gallbladder:  Multiple  echogenic foci with posterior acoustic shadowing are noted within the gallbladder consistent with cholelithiasis.  No gallbladder wall thickening or pericholecystic fluid.  Per the sonographer the sonographic Murphy's sign was negative.  Common bile duct:  The common bile duct is mildly dilated at 9 mm in caliber.  Liver:  Unremarkable sonographic appearance of the liver.  IMPRESSION:  1.  Cholelithiasis without sonographic findings to suggest acute cholecystitis.  2.  Borderline enlarged common bile duct at 9 mm in diameter.  No choledocholithiasis seen within the imaged portions.  The distal common bile duct is not well seen secondary to obscuring bowel gas.                    Original Report Authenticated By: Malachy Moan, M.D.      Medications: I have reviewed the patient's current medications.  Impression: 1. Symptomatic cholelithiasis. 2. Obesity.     Plan: 1. Await ERCP today.     LOS: 1 day   Wilson Singer Pager [phone]  08/02/2012, 8:05 AM

## 2012-08-02 NOTE — Brief Op Note (Signed)
08/01/2012 - 08/02/2012  10:31 AM  PATIENT:  Amanda Reyes  27 y.o. female  PRE-OPERATIVE DIAGNOSIS:  CBD Stones  POST-OPERATIVE DIAGNOSIS:  * No post-op diagnosis entered *  PROCEDURE:  Procedure(s): ENDOSCOPIC RETROGRADE CHOLANGIOPANCREATOGRAPHY (ERCP) (N/A) SPHINCTEROTOMY (N/A) STONE EXTRACTION WITH BASKET (N/A) PANCREATIC STENT PLACEMENT (N/A)  SURGEON:  Surgeon(s) and Role:    * Malissa Hippo, MD - Primary  ERCP findings; food debris noted in the stomach. Patent pylorus. Normal ampulla of Vater. Pancreatic duct initially cannulated with  035 guidewire. Selective cannulation of CBD somewhat difficult. Therefore single pigtail 5 French 5 cm long pancreatic stent left in place. CBD and CHD 7-8 mm. Patent cystic duct. Biliary sphincterotomy performed with escape of 2 stones. Patient tolerated procedure well

## 2012-08-02 NOTE — Op Note (Signed)
NAMESHI, Amanda Reyes               ACCOUNT NO.:  [id number]  MEDICAL RECORD NO.:  [id number]  LOCATION:  [facility]                          FACILITY:  [facility]  PHYSICIAN:  Lionel December, M.D.    DATE OF BIRTH:  05-Jun-1985  DATE OF PROCEDURE:  08/02/2012 DATE OF DISCHARGE:                              OPERATIVE REPORT   PROCEDURE:  ERCP with biliary sphincterotomy and stone extraction and pancreatic stent placement.  INDICATION:  The patient is a 27 year old female who presents with subcostal pain, elevated bilirubin, transaminases, cholelithiasis, and mildly dilated bile duct.  She is suspected to have choledocholithiasis. Procedures were reviewed with the patient.  Informed consent was obtained.  MEDICATIONS FOR SEDATION/ANESTHESIA:  Please see anesthesia records for details.  FINDINGS:  Procedure was performed in the OR.  Once the patient was placed under general endotracheal anesthesia, she was turned into semiprone position.  Therapeutic Pentax video duodenoscope was passed through oropharynx without any difficulty into esophagus and stomach where some food debris was noted.  Pyloric channel was patent.  Scope was passed.  Second part of duodenum and the ampulla appeared to be normal.  Cannulation attempted with RX 44 Autotome and 0.035 Hydra Jagwire.  Pancreatic duct was initially cannulated with guidewire, but no contrast was injected.  Mucosa at the ampulla was somewhat friable, could not be easily cannulate the bile duct.  Therefore guidewire was left in the pancreatic duct to facilitate selective entry of the bile duct which was then accomplished.  CBD was 7 to 8 mm.  As sphincterotomy was performed, two stones came out spontaneously and subsequently passed Tonia basket and balloon, and no other stones or filling defects identified.  Single pigtail 5-French 5 cm long pancreatic stent was also placed for prophylactic purposes.  Endoscope was withdrawn.  The patient  tolerated the procedure well.  FINAL DIAGNOSES: 1. Choledocholithiasis. 2. Two stones removed following biliary sphincterotomy. 3. Pancreatic stent placed for prophylactic purposes.  RECOMMENDATIONS: 1. Indomethacin 100 mg suppository rectum x1 2. Clear liquids today.  We will advance diet in a.m. 3. We will repeat a CBC, comprehensive chemistry panel, serum amylase     in a.m. 4. She does well and cholecystectomy is not planned until later she     should be able to go home tomorrow.          ______________________________ Lionel December, M.D.     NR/MEDQ  D:  08/02/2012  T:  08/02/2012  Job:  [id number]

## 2012-08-02 NOTE — Transfer of Care (Signed)
Immediate Anesthesia Transfer of Care Note  Patient: Amanda Reyes  Procedure(s) Performed: Procedure(s): ENDOSCOPIC RETROGRADE CHOLANGIOPANCREATOGRAPHY (ERCP) with sphincterotomy and stone extraction; Possible pancreatic stenting.   (N/A)  Patient Location: PACU  Anesthesia Type:General  Level of Consciousness: awake, alert  and oriented  Airway & Oxygen Therapy: Patient Spontanous Breathing and Patient connected to face mask oxygen  Post-op Assessment: Report given to PACU RN  Post vital signs: Reviewed and stable  Complications: No apparent anesthesia complications

## 2012-08-02 NOTE — Consult Note (Signed)
Reason for Consult: Cholelithiasis, probable choledocholithiasis Referring Physician: [facility], Dr. Retta Diones is an 27 y.o. female.  HPI: Patient is a 27 year old morbidly obese black female who presented emergency room with worsening right upper quadrant abdominal pain and nausea. Ultrasound of gallbladder revealed cholelithiasis with a dilated common bile duct. She was also noted to have elevated liver enzyme test. She was bit by the hospitalist. She did undergo an ERCP by Dr. Karilyn Cota.  History reviewed. No pertinent past medical history.  History reviewed. No pertinent past surgical history.  No family history on file.  Social History:  reports that she has never smoked. She does not have any smokeless tobacco history on file. She reports that she does not drink alcohol or use illicit drugs.  Allergies:  Allergies  Allergen Reactions  . Penicillins Hives    Medications: I have reviewed the patient's current medications.  Results for orders placed during the hospital encounter of 08/01/12 (from the past 48 hour(s))  CBC     Status: Abnormal   Collection Time    08/01/12  2:00 PM      Result Value Range   WBC 4.7  4.0 - 10.5 K/uL   RBC 4.64  3.87 - 5.11 MIL/uL   Hemoglobin 11.9 (*) 12.0 - 15.0 g/dL   HCT 13.0  86.5 - 78.4 %   MCV 78.9  78.0 - 100.0 fL   MCH 25.6 (*) 26.0 - 34.0 pg   MCHC 32.5  30.0 - 36.0 g/dL   RDW 69.6  29.5 - 28.4 %   Platelets 298  150 - 400 K/uL  COMPREHENSIVE METABOLIC PANEL     Status: Abnormal   Collection Time    08/01/12  2:00 PM      Result Value Range   Sodium 138  135 - 145 mEq/L   Potassium 3.9  3.5 - 5.1 mEq/L   Chloride 100  96 - 112 mEq/L   CO2 28  19 - 32 mEq/L   Glucose, Bld 100 (*) 70 - 99 mg/dL   BUN 9  6 - 23 mg/dL   Creatinine, Ser 1.32  0.50 - 1.10 mg/dL   Calcium 9.1  8.4 - 44.0 mg/dL   Total Protein 7.5  6.0 - 8.3 g/dL   Albumin 3.6  3.5 - 5.2 g/dL   AST 102 (*) 0 - 37 U/L   ALT 657 (*) 0 - 35 U/L    Alkaline Phosphatase 377 (*) 39 - 117 U/L   Total Bilirubin 2.8 (*) 0.3 - 1.2 mg/dL   GFR calc non Af Amer >90  >90 mL/min   GFR calc Af Amer >90  >90 mL/min   Comment:            The eGFR has been calculated     using the CKD EPI equation.     This calculation has not been     validated in all clinical     situations.     eGFR's persistently     <90 mL/min signify     possible Chronic Kidney Disease.  LIPASE, BLOOD     Status: None   Collection Time    08/01/12  2:00 PM      Result Value Range   Lipase 35  11 - 59 U/L  TROPONIN I     Status: None   Collection Time    08/01/12  2:00 PM      Result Value Range   Troponin I <  0.30  <0.30 ng/mL   Comment:            Due to the release kinetics of cTnI,     a negative result within the first hours     of the onset of symptoms does not rule out     myocardial infarction with certainty.     If myocardial infarction is still suspected,     repeat the test at appropriate intervals.  TSH     Status: None   Collection Time    08/01/12  2:00 PM      Result Value Range   TSH 1.948  0.350 - 4.500 uIU/mL  URINALYSIS, ROUTINE W REFLEX MICROSCOPIC     Status: Abnormal   Collection Time    08/01/12  2:01 PM      Result Value Range   Color, Urine ORANGE (*) YELLOW   Comment: BIOCHEMICALS MAY BE AFFECTED BY COLOR   APPearance CLEAR  CLEAR   Specific Gravity, Urine 1.020  1.005 - 1.030   pH 7.5  5.0 - 8.0   Glucose, UA 100 (*) NEGATIVE mg/dL   Hgb urine dipstick LARGE (*) NEGATIVE   Bilirubin Urine LARGE (*) NEGATIVE   Ketones, ur TRACE (*) NEGATIVE mg/dL   Protein, ur 30 (*) NEGATIVE mg/dL   Urobilinogen, UA 4.0 (*) 0.0 - 1.0 mg/dL   Nitrite NEGATIVE  NEGATIVE   Leukocytes, UA NEGATIVE  NEGATIVE  PREGNANCY, URINE     Status: None   Collection Time    08/01/12  2:01 PM      Result Value Range   Preg Test, Ur NEGATIVE  NEGATIVE   Comment:            THE SENSITIVITY OF THIS     METHODOLOGY IS >20 mIU/mL.  URINE MICROSCOPIC-ADD ON      Status: None   Collection Time    08/01/12  2:01 PM      Result Value Range   Squamous Epithelial / LPF RARE  RARE   RBC / HPF TOO NUMEROUS TO COUNT  <3 RBC/hpf  PROTIME-INR     Status: None   Collection Time    08/02/12  6:32 AM      Result Value Range   Prothrombin Time 13.1  11.6 - 15.2 seconds   INR 1.00  0.00 - 1.49  COMPREHENSIVE METABOLIC PANEL     Status: Abnormal   Collection Time    08/02/12  6:39 AM      Result Value Range   Sodium 137  135 - 145 mEq/L   Potassium 3.8  3.5 - 5.1 mEq/L   Chloride 101  96 - 112 mEq/L   CO2 26  19 - 32 mEq/L   Glucose, Bld 96  70 - 99 mg/dL   BUN 11  6 - 23 mg/dL   Creatinine, Ser 1.61  0.50 - 1.10 mg/dL   Calcium 8.8  8.4 - 09.6 mg/dL   Total Protein 6.4  6.0 - 8.3 g/dL   Albumin 3.0 (*) 3.5 - 5.2 g/dL   AST 045 (*) 0 - 37 U/L   ALT 530 (*) 0 - 35 U/L   Alkaline Phosphatase 361 (*) 39 - 117 U/L   Total Bilirubin 3.7 (*) 0.3 - 1.2 mg/dL   GFR calc non Af Amer >90  >90 mL/min   GFR calc Af Amer >90  >90 mL/min   Comment:            The  eGFR has been calculated     using the CKD EPI equation.     This calculation has not been     validated in all clinical     situations.     eGFR's persistently     <90 mL/min signify     possible Chronic Kidney Disease.  CBC     Status: Abnormal   Collection Time    08/02/12  6:39 AM      Result Value Range   WBC 4.9  4.0 - 10.5 K/uL   RBC 4.28  3.87 - 5.11 MIL/uL   Hemoglobin 11.0 (*) 12.0 - 15.0 g/dL   HCT 21.3 (*) 08.6 - 57.8 %   MCV 79.0  78.0 - 100.0 fL   MCH 25.7 (*) 26.0 - 34.0 pg   MCHC 32.5  30.0 - 36.0 g/dL   RDW 46.9 (*) 62.9 - 52.8 %   Platelets 290  150 - 400 K/uL    Dg Chest 2 View  08/01/2012   *RADIOLOGY REPORT*  Clinical Data: Chest pain through to her back for 2-day  CHEST - 2 VIEW  Comparison: It is none  Findings: Enlargement of cardiac silhouette. Mediastinal contours and pulmonary vascularity normal. Minimal subsegmental atelectasis at right base. Lungs  otherwise clear. No pleural effusion or pneumothorax. Bones unremarkable.  IMPRESSION: Enlargement of cardiac silhouette. Minimal right basilar atelectasis.   Original Report Authenticated By: Ulyses Southward, M.D.   US Abdomen Limited Ruq  08/01/2012   *RADIOLOGY REPORT*  Clinical Data:  Gallstones, right upper quadrant pain  LIMITED ABDOMINAL ULTRASOUND - RIGHT UPPER QUADRANT  Comparison:  None.  Findings:  Gallbladder:  Multiple echogenic foci with posterior acoustic shadowing are noted within the gallbladder consistent with cholelithiasis.  No gallbladder wall thickening or pericholecystic fluid.  Per the sonographer the sonographic Murphy's sign was negative.  Common bile duct:  The common bile duct is mildly dilated at 9 mm in caliber.  Liver:  Unremarkable sonographic appearance of the liver.  IMPRESSION:  1.  Cholelithiasis without sonographic findings to suggest acute cholecystitis.  2.  Borderline enlarged common bile duct at 9 mm in diameter.  No choledocholithiasis seen within the imaged portions.  The distal common bile duct is not well seen secondary to obscuring bowel gas.                    Original Report Authenticated By: Malachy Moan, M.D.    ROS: See chart Blood pressure 139/95, pulse 79, temperature 98 F (36.7 C), temperature source Oral, resp. rate 20, height 5\' 7"  (1.702 m), weight 158.7 kg (349 lb 13.9 oz), last menstrual period 08/01/2012, SpO2 98.00%. Physical Exam: Pleasant black female in no acute distress. Abdomen soft with mild tenderness in right upper quadrant to deep palpation. No hepatosplenomegaly, masses, or hernias identified.  Assessment/Plan: Impression: Cholelithiasis, choledocholithiasis probable Plan: Agree with proceeding with ERCP. She will need a laparoscopic cholecystectomy once her liver enzyme tests have improved. Will follow with you.  Kenyetta Wimbish A 08/02/2012, 7:57 AM

## 2012-08-02 NOTE — Anesthesia Postprocedure Evaluation (Addendum)
  Anesthesia Post-op Note  Patient: Amanda Reyes  Procedure(s) Performed: Procedure(s): ENDOSCOPIC RETROGRADE CHOLANGIOPANCREATOGRAPHY (ERCP) with sphincterotomy and stone extraction; Possible pancreatic stenting.   (N/A)  Patient Location: PACU  Anesthesia Type:General  Level of Consciousness: awake, alert  and oriented  Airway and Oxygen Therapy: Patient Spontanous Breathing and Patient connected to face mask oxygen  Post-op Pain: none  Post-op Assessment: Post-op Vital signs reviewed, Patient's Cardiovascular Status Stable, Respiratory Function Stable, Patent Airway and No signs of Nausea or vomiting  Post-op Vital Signs: Reviewed and stable  Complications: No apparent anesthesia complications 08/02/12, 1900  VSS, patient going home tom. Surgical consult scheduled.  No aparent anesthesia complications.

## 2012-08-02 NOTE — Anesthesia Procedure Notes (Signed)
Procedure Name: Intubation Date/Time: 08/02/2012 9:24 AM Performed by: Glynn Octave E Pre-anesthesia Checklist: Patient identified, Patient being monitored, Timeout performed, Emergency Drugs available and Suction available Patient Re-evaluated:Patient Re-evaluated prior to inductionOxygen Delivery Method: Circle System Utilized Preoxygenation: Pre-oxygenation with 100% oxygen Intubation Type: IV induction Ventilation: Mask ventilation without difficulty Laryngoscope Size: Mac and 3 Grade View: Grade II Tube type: Oral Tube size: 7.0 mm Number of attempts: 1 Airway Equipment and Method: stylet Placement Confirmation: ETT inserted through vocal cords under direct vision,  positive ETCO2 and breath sounds checked- equal and bilateral Secured at: 21 cm Tube secured with: Tape Dental Injury: Teeth and Oropharynx as per pre-operative assessment

## 2012-08-03 LAB — COMPREHENSIVE METABOLIC PANEL
BUN: 7 mg/dL (ref 6–23)
CO2: 27 mEq/L (ref 19–32)
Calcium: 7.9 mg/dL — ABNORMAL LOW (ref 8.4–10.5)
Chloride: 108 mEq/L (ref 96–112)
Creatinine, Ser: 0.78 mg/dL (ref 0.50–1.10)
GFR calc Af Amer: >90 mL/min (ref >90)
GFR calc non Af Amer: >90 mL/min (ref >90)
Glucose, Bld: 99 mg/dL (ref 70–99)
Total Bilirubin: 0.9 mg/dL (ref 0.3–1.2)

## 2012-08-03 LAB — CBC
HCT: 32.7 % — ABNORMAL LOW (ref 36.0–46.0)
Hemoglobin: 10.5 g/dL — ABNORMAL LOW (ref 12.0–15.0)
MCH: 25.4 pg — ABNORMAL LOW (ref 26.0–34.0)
MCV: 79.2 fL (ref 78.0–100.0)
RBC: 4.13 MIL/uL (ref 3.87–5.11)
WBC: 8 10*3/uL (ref 4.0–10.5)

## 2012-08-03 LAB — AMYLASE: Amylase: 275 U/L — ABNORMAL HIGH (ref 0–105)

## 2012-08-03 MED ORDER — ONDANSETRON HCL 4 MG PO TABS: 4.0000 mg | ORAL_TABLET | Freq: Four times a day (QID) | ORAL | Status: DC | PRN

## 2012-08-03 MED ORDER — OXYCODONE HCL 5 MG PO TABS: 5.0000 mg | ORAL_TABLET | ORAL | Status: DC | PRN

## 2012-08-03 NOTE — Discharge Summary (Signed)
Physician Discharge Summary  Alex Mcmanigal XBJ:[id number] DOB: 1985/08/03 DOA: 08/01/2012  PCP: No PCP Per Patient  Admit date: 08/01/2012 Discharge date: 08/03/2012  Time spent: Greater than 30 minutes  Recommendations for Outpatient Follow-up:  1. Follow with Dr. Lovell Sheehan, surgery for cholecystectomy.   Discharge Diagnoses:  1. Acute cholelithiasis, status post ERCP and removal of 2 stones. 2. Obesity.   Discharge Condition: Stable and improved.  Diet recommendation: Low-fat diet.  Filed Weights   08/01/12 1233 08/01/12 2024  Weight: 104.327 kg (230 lb) 158.7 kg (349 lb 13.9 oz)    History of present illness:  This 27 year old lady presents to the hospital with symptoms of right-sided abdominal pain. Please see initial history as outlined below: HPI: Carlos Quackenbush is a 27 y.o. female complains of right-sided abdominal pain which started approximately 4 days ago. The pain has been fairly intermittent, radiates to the right back. She has not had any significant nausea with it. There's been no fever. When she presented to the emergency room, she had elevated liver function tests and ultrasound showed the presence of gallstones with dilated common bile duct. She is now being referred for admission.    ,   Hospital Course:  Patient was admitted and given intravenous fluids and analgesia as required. She was seen by gastroenterology and surgery. Dr Karilyn Cota, gastroenterology performed ERCP on her yesterday and was able to retrieve 2 stones. Her symptoms are resolved. She was also seen by Dr. Lovell Sheehan, surgery, who will arrange for her to have surgery in the next few days to remove her gallbladder. Today she is well, has tolerated a diet and is without pain. She is stable for discharge.  Procedures:  ERCP by Dr. Karilyn Cota, gastroenterology.   Consultations:  Dr Karilyn Cota, gastroenterology.  Dr. Lovell Sheehan, surgery.  Discharge Exam: Filed Vitals:   08/02/12 1206 08/02/12 1425 08/02/12 2106  08/03/12 0429  BP: 136/90 146/85 141/90 124/84  Pulse: 67 73 78 84  Temp: 97.3 F (36.3 C) 97.8 F (36.6 C) 98.4 F (36.9 C) 98.5 F (36.9 C)  TempSrc: Oral Oral Oral Oral  Resp: 18 19 20 20   Height:      Weight:      SpO2: 94% 93% 99% 97%    General: She looks systemically well. She is obese. She does not appear to be in pain now. Cardiovascular: Heart sounds are present and normal without murmurs or added sounds. Respiratory: Lung fields are clear. Abdomen is soft and nontender now. She is alert and orientated  Discharge Instructions  Discharge Orders   Future Orders Complete By Expires     Diet - low sodium heart healthy  As directed     Increase activity slowly  As directed         Medication List    TAKE these medications       ondansetron 4 MG tablet  Commonly known as:  ZOFRAN  Take 1 tablet (4 mg total) by mouth every 6 (six) hours as needed for nausea.     oxyCODONE 5 MG immediate release tablet  Commonly known as:  ROXICODONE  Take 1 tablet (5 mg total) by mouth every 4 (four) hours as needed for pain.       Allergies  Allergen Reactions  . Penicillins Hives       Follow-up Information   Follow up with Dalia Heading, MD. Call in 2 days.   Contact information:   [id number] [place] [address] [phone]  The results of significant diagnostics from this hospitalization (including imaging, microbiology, ancillary and laboratory) are listed below for reference.    Significant Diagnostic Studies: Dg Chest 2 View  08/01/2012   *RADIOLOGY REPORT*  Clinical Data: Chest pain through to her back for 2-day  CHEST - 2 VIEW  Comparison: It is none  Findings: Enlargement of cardiac silhouette. Mediastinal contours and pulmonary vascularity normal. Minimal subsegmental atelectasis at right base. Lungs otherwise clear. No pleural effusion or pneumothorax. Bones unremarkable.  IMPRESSION: Enlargement of cardiac silhouette. Minimal right  basilar atelectasis.   Original Report Authenticated By: Ulyses Southward, M.D.   Dg Ercp With Sphincterotomy  08/02/2012   *RADIOLOGY REPORT*  Clinical Data: Bile duct stones, basket removal of several stones, sphincterotomy, pancreatic duct stent placement.  Obesity.  ERCP  Comparison: Abdominal ultrasound - 08/01/2012  Fluoroscopy time:  3 minutes, 6 seconds  Findings:  Seven spot intraoperative fluoroscopic images of the right upper quadrant during ERCP are provided for review. Examination is degraded secondary to coned field of view and under penetration, presumably secondary to reported history of patient obesity.  Images demonstrate an ERCP probe overlying the right upper abdominal quadrant.  There is selective cannulation of the common bile duct as well as presumably the pancreatic duct (image labeled #8).  There is contrast opacification of the common bile duct which appears nondilated.  There is an apparent ill-defined filling defect within the distal aspect of the common bile duct.  Subsequent images demonstrate insufflation of a sphincterotomy balloon with the distal aspect of the common bile duct (image labeled #14).  IMPRESSION: ERCP with sphincterotomy as above.   Original Report Authenticated By: Tacey Ruiz, MD   US Abdomen Limited Ruq  08/01/2012   *RADIOLOGY REPORT*  Clinical Data:  Gallstones, right upper quadrant pain  LIMITED ABDOMINAL ULTRASOUND - RIGHT UPPER QUADRANT  Comparison:  None.  Findings:  Gallbladder:  Multiple echogenic foci with posterior acoustic shadowing are noted within the gallbladder consistent with cholelithiasis.  No gallbladder wall thickening or pericholecystic fluid.  Per the sonographer the sonographic Murphy's sign was negative.  Common bile duct:  The common bile duct is mildly dilated at 9 mm in caliber.  Liver:  Unremarkable sonographic appearance of the liver.  IMPRESSION:  1.  Cholelithiasis without sonographic findings to suggest acute cholecystitis.  2.   Borderline enlarged common bile duct at 9 mm in diameter.  No choledocholithiasis seen within the imaged portions.  The distal common bile duct is not well seen secondary to obscuring bowel gas.                    Original Report Authenticated By: Malachy Moan, M.D.        Labs: Basic Metabolic Panel:  Recent Labs Lab 08/01/12 1400 08/02/12 0639 08/03/12 0627  NA 138 137 140  K 3.9 3.8 3.4*  CL 100 101 108  CO2 28 26 27   GLUCOSE 100* 96 99  BUN 9 11 7   CREATININE 0.84 0.79 0.78  CALCIUM 9.1 8.8 7.9*   Liver Function Tests:  Recent Labs Lab 08/01/12 1400 08/02/12 0639 08/03/12 0627  AST 395* 228* 79*  ALT 657* 530* 327*  ALKPHOS 377* 361* 280*  BILITOT 2.8* 3.7* 0.9  PROT 7.5 6.4 6.2  ALBUMIN 3.6 3.0* 2.6*    Recent Labs Lab 08/01/12 1400 08/03/12 0627  LIPASE 35  --   AMYLASE  --  275*    CBC:  Recent Labs Lab 08/01/12 1400  08/02/12 0639 08/03/12 0627  WBC 4.7 4.9 8.0  HGB 11.9* 11.0* 10.5*  HCT 36.6 33.8* 32.7*  MCV 78.9 79.0 79.2  PLT 298 290 275   Cardiac Enzymes:  Recent Labs Lab 08/01/12 1400  TROPONINI <0.30        Signed:  Ozias Dicenzo C  [facility] 08/03/2012, 10:28 AM

## 2012-08-03 NOTE — Progress Notes (Signed)
Subjective; Patient feels much better. She has noted mild intermittent cramping and epigastric region. She had no nausea or pain with meal. Objective; BP 124/84  Pulse 84  Temp(Src) 98.5 F (36.9 C) (Oral)  Resp 20  Ht 5\' 7"  (1.702 m)  Wt 349 lb 13.9 oz (158.7 kg)  BMI 54.78 kg/m2  SpO2 97%  LMP 08/01/2012 Abdomen is obese but soft and nontender without organomegaly or masses. Lab data; WBC 8.0, H&H 10.5 and 32.7 platelet count 275K Bilirubin 0.9, AP 280, AST 79, ALT 327. Serum amylase 275. Assessment; Patient is doing well following ERCP with sphincterotomy and stone extraction. Bilirubin and transaminases are down. Mild elevation of serum amylase to procedure but no other features suggest pancreatitis. Patient has mild anemia and she will need to be on iron and she is recovered from her acute illness. Dr. Lovell Sheehan is planning cholecystectomy next week. Will do a KUB done to document that she has passed pancreatic stent. Agree with discharge planning.

## 2012-08-03 NOTE — H&P (Signed)
Amanda Reyes is an 28 y.o. female.   Chief Complaint: Cholecystitis, cholelithiasis HPI: Patient is a 27 year old morbidly obese black female who presented to [facility] with right upper quadrant abdominal pain and nausea. She was done an ultrasound the gallbladder to have cholelithiasis and possible choledocholithiasis. Her liver enzyme tests were elevated as well as her total bilirubin. Dr. Karilyn Cota of gastroenterology was consulted and she underwent an ERCP with multiple stone extraction from the common bile duct on 08/02/2012. She tolerated the procedure well. Her postoperative course had been unremarkable. She is now presenting tape and hospital for laparoscopic cholecystectomy.  History reviewed. No pertinent past medical history.  History reviewed. No pertinent past surgical history.  No family history on file. Social History:  reports that she has never smoked. She does not have any smokeless tobacco history on file. She reports that she does not drink alcohol or use illicit drugs.  Allergies:  Allergies  Allergen Reactions  . Penicillins Hives    No prescriptions prior to admission    Results for orders placed during the hospital encounter of 08/01/12 (from the past 48 hour(s))  CBC     Status: Abnormal   Collection Time    08/01/12  2:00 PM      Result Value Range   WBC 4.7  4.0 - 10.5 K/uL   RBC 4.64  3.87 - 5.11 MIL/uL   Hemoglobin 11.9 (*) 12.0 - 15.0 g/dL   HCT 62.1  30.8 - 65.7 %   MCV 78.9  78.0 - 100.0 fL   MCH 25.6 (*) 26.0 - 34.0 pg   MCHC 32.5  30.0 - 36.0 g/dL   RDW 84.6  96.2 - 95.2 %   Platelets 298  150 - 400 K/uL  COMPREHENSIVE METABOLIC PANEL     Status: Abnormal   Collection Time    08/01/12  2:00 PM      Result Value Range   Sodium 138  135 - 145 mEq/L   Potassium 3.9  3.5 - 5.1 mEq/L   Chloride 100  96 - 112 mEq/L   CO2 28  19 - 32 mEq/L   Glucose, Bld 100 (*) 70 - 99 mg/dL   BUN 9  6 - 23 mg/dL   Creatinine, Ser 8.41  0.50 - 1.10 mg/dL    Calcium 9.1  8.4 - 32.4 mg/dL   Total Protein 7.5  6.0 - 8.3 g/dL   Albumin 3.6  3.5 - 5.2 g/dL   AST 401 (*) 0 - 37 U/L   ALT 657 (*) 0 - 35 U/L   Alkaline Phosphatase 377 (*) 39 - 117 U/L   Total Bilirubin 2.8 (*) 0.3 - 1.2 mg/dL   GFR calc non Af Amer >90  >90 mL/min   GFR calc Af Amer >90  >90 mL/min   Comment:            The eGFR has been calculated     using the CKD EPI equation.     This calculation has not been     validated in all clinical     situations.     eGFR's persistently     <90 mL/min signify     possible Chronic Kidney Disease.  LIPASE, BLOOD     Status: None   Collection Time    08/01/12  2:00 PM      Result Value Range   Lipase 35  11 - 59 U/L  TROPONIN I     Status:  None   Collection Time    08/01/12  2:00 PM      Result Value Range   Troponin I <0.30  <0.30 ng/mL   Comment:            Due to the release kinetics of cTnI,     a negative result within the first hours     of the onset of symptoms does not rule out     myocardial infarction with certainty.     If myocardial infarction is still suspected,     repeat the test at appropriate intervals.  TSH     Status: None   Collection Time    08/01/12  2:00 PM      Result Value Range   TSH 1.948  0.350 - 4.500 uIU/mL  HEMOGLOBIN A1C     Status: None   Collection Time    08/01/12  2:00 PM      Result Value Range   Hemoglobin A1C 5.2  <5.7 %   Comment: (NOTE)                                                                               According to the ADA Clinical Practice Recommendations for 2011, when     HbA1c is used as a screening test:      >=6.5%   Diagnostic of Diabetes Mellitus               (if abnormal result is confirmed)     5.7-6.4%   Increased risk of developing Diabetes Mellitus     References:Diagnosis and Classification of Diabetes Mellitus,Diabetes     Care,2011,34(Suppl 1):S62-S69 and Standards of Medical Care in             Diabetes - 2011,Diabetes Care,2011,34 (Suppl  1):S11-S61.     CORRECTED ON 05/24 AT 2136: PREVIOUSLY REPORTED AS 5.2 Reference range: <5.7   Mean Plasma Glucose 103  <117 mg/dL  URINALYSIS, ROUTINE W REFLEX MICROSCOPIC     Status: Abnormal   Collection Time    08/01/12  2:01 PM      Result Value Range   Color, Urine ORANGE (*) YELLOW   Comment: BIOCHEMICALS MAY BE AFFECTED BY COLOR   APPearance CLEAR  CLEAR   Specific Gravity, Urine 1.020  1.005 - 1.030   pH 7.5  5.0 - 8.0   Glucose, UA 100 (*) NEGATIVE mg/dL   Hgb urine dipstick LARGE (*) NEGATIVE   Bilirubin Urine LARGE (*) NEGATIVE   Ketones, ur TRACE (*) NEGATIVE mg/dL   Protein, ur 30 (*) NEGATIVE mg/dL   Urobilinogen, UA 4.0 (*) 0.0 - 1.0 mg/dL   Nitrite NEGATIVE  NEGATIVE   Leukocytes, UA NEGATIVE  NEGATIVE  PREGNANCY, URINE     Status: None   Collection Time    08/01/12  2:01 PM      Result Value Range   Preg Test, Ur NEGATIVE  NEGATIVE   Comment:            THE SENSITIVITY OF THIS     METHODOLOGY IS >20 mIU/mL.  URINE MICROSCOPIC-ADD ON     Status: None   Collection Time    08/01/12  2:01 PM      Result Value Range   Squamous Epithelial / LPF RARE  RARE   RBC / HPF TOO NUMEROUS TO COUNT  <3 RBC/hpf  PROTIME-INR     Status: None   Collection Time    08/02/12  6:32 AM      Result Value Range   Prothrombin Time 13.1  11.6 - 15.2 seconds   INR 1.00  0.00 - 1.49  COMPREHENSIVE METABOLIC PANEL     Status: Abnormal   Collection Time    08/02/12  6:39 AM      Result Value Range   Sodium 137  135 - 145 mEq/L   Potassium 3.8  3.5 - 5.1 mEq/L   Chloride 101  96 - 112 mEq/L   CO2 26  19 - 32 mEq/L   Glucose, Bld 96  70 - 99 mg/dL   BUN 11  6 - 23 mg/dL   Creatinine, Ser 1.19  0.50 - 1.10 mg/dL   Calcium 8.8  8.4 - 14.7 mg/dL   Total Protein 6.4  6.0 - 8.3 g/dL   Albumin 3.0 (*) 3.5 - 5.2 g/dL   AST 829 (*) 0 - 37 U/L   ALT 530 (*) 0 - 35 U/L   Alkaline Phosphatase 361 (*) 39 - 117 U/L   Total Bilirubin 3.7 (*) 0.3 - 1.2 mg/dL   GFR calc non Af Amer >90  >90  mL/min   GFR calc Af Amer >90  >90 mL/min   Comment:            The eGFR has been calculated     using the CKD EPI equation.     This calculation has not been     validated in all clinical     situations.     eGFR's persistently     <90 mL/min signify     possible Chronic Kidney Disease.  CBC     Status: Abnormal   Collection Time    08/02/12  6:39 AM      Result Value Range   WBC 4.9  4.0 - 10.5 K/uL   RBC 4.28  3.87 - 5.11 MIL/uL   Hemoglobin 11.0 (*) 12.0 - 15.0 g/dL   HCT 56.2 (*) 13.0 - 86.5 %   MCV 79.0  78.0 - 100.0 fL   MCH 25.7 (*) 26.0 - 34.0 pg   MCHC 32.5  30.0 - 36.0 g/dL   RDW 78.4 (*) 69.6 - 29.5 %   Platelets 290  150 - 400 K/uL  CBC     Status: Abnormal   Collection Time    08/03/12  6:27 AM      Result Value Range   WBC 8.0  4.0 - 10.5 K/uL   RBC 4.13  3.87 - 5.11 MIL/uL   Hemoglobin 10.5 (*) 12.0 - 15.0 g/dL   HCT 28.4 (*) 13.2 - 44.0 %   MCV 79.2  78.0 - 100.0 fL   MCH 25.4 (*) 26.0 - 34.0 pg   MCHC 32.1  30.0 - 36.0 g/dL   RDW 10.2 (*) 72.5 - 36.6 %   Platelets 275  150 - 400 K/uL  COMPREHENSIVE METABOLIC PANEL     Status: Abnormal   Collection Time    08/03/12  6:27 AM      Result Value Range   Sodium 140  135 - 145 mEq/L   Potassium 3.4 (*) 3.5 - 5.1 mEq/L   Chloride 108  96 - 112  mEq/L   CO2 27  19 - 32 mEq/L   Glucose, Bld 99  70 - 99 mg/dL   BUN 7  6 - 23 mg/dL   Creatinine, Ser 1.61  0.50 - 1.10 mg/dL   Calcium 7.9 (*) 8.4 - 10.5 mg/dL   Total Protein 6.2  6.0 - 8.3 g/dL   Albumin 2.6 (*) 3.5 - 5.2 g/dL   AST 79 (*) 0 - 37 U/L   ALT 327 (*) 0 - 35 U/L   Alkaline Phosphatase 280 (*) 39 - 117 U/L   Total Bilirubin 0.9  0.3 - 1.2 mg/dL   GFR calc non Af Amer >90  >90 mL/min   GFR calc Af Amer >90  >90 mL/min   Comment:            The eGFR has been calculated     using the CKD EPI equation.     This calculation has not been     validated in all clinical     situations.     eGFR's persistently     <90 mL/min signify     possible  Chronic Kidney Disease.   Dg Chest 2 View  08/01/2012   *RADIOLOGY REPORT*  Clinical Data: Chest pain through to her back for 2-day  CHEST - 2 VIEW  Comparison: It is none  Findings: Enlargement of cardiac silhouette. Mediastinal contours and pulmonary vascularity normal. Minimal subsegmental atelectasis at right base. Lungs otherwise clear. No pleural effusion or pneumothorax. Bones unremarkable.  IMPRESSION: Enlargement of cardiac silhouette. Minimal right basilar atelectasis.   Original Report Authenticated By: Ulyses Southward, M.D.   Dg Ercp With Sphincterotomy  08/02/2012   *RADIOLOGY REPORT*  Clinical Data: Bile duct stones, basket removal of several stones, sphincterotomy, pancreatic duct stent placement.  Obesity.  ERCP  Comparison: Abdominal ultrasound - 08/01/2012  Fluoroscopy time:  3 minutes, 6 seconds  Findings:  Seven spot intraoperative fluoroscopic images of the right upper quadrant during ERCP are provided for review. Examination is degraded secondary to coned field of view and under penetration, presumably secondary to reported history of patient obesity.  Images demonstrate an ERCP probe overlying the right upper abdominal quadrant.  There is selective cannulation of the common bile duct as well as presumably the pancreatic duct (image labeled #8).  There is contrast opacification of the common bile duct which appears nondilated.  There is an apparent ill-defined filling defect within the distal aspect of the common bile duct.  Subsequent images demonstrate insufflation of a sphincterotomy balloon with the distal aspect of the common bile duct (image labeled #14).  IMPRESSION: ERCP with sphincterotomy as above.   Original Report Authenticated By: Tacey Ruiz, MD   US Abdomen Limited Ruq  08/01/2012   *RADIOLOGY REPORT*  Clinical Data:  Gallstones, right upper quadrant pain  LIMITED ABDOMINAL ULTRASOUND - RIGHT UPPER QUADRANT  Comparison:  None.  Findings:  Gallbladder:  Multiple echogenic  foci with posterior acoustic shadowing are noted within the gallbladder consistent with cholelithiasis.  No gallbladder wall thickening or pericholecystic fluid.  Per the sonographer the sonographic Murphy's sign was negative.  Common bile duct:  The common bile duct is mildly dilated at 9 mm in caliber.  Liver:  Unremarkable sonographic appearance of the liver.  IMPRESSION:  1.  Cholelithiasis without sonographic findings to suggest acute cholecystitis.  2.  Borderline enlarged common bile duct at 9 mm in diameter.  No choledocholithiasis seen within the imaged portions.  The distal common bile  duct is not well seen secondary to obscuring bowel gas.                    Original Report Authenticated By: Malachy Moan, M.D.    Review of Systems  Constitutional: Positive for malaise/fatigue.  HENT: Negative.   Eyes: Negative.   Respiratory: Negative.   Cardiovascular: Negative.   Gastrointestinal: Positive for abdominal pain.  Genitourinary: Negative.   Musculoskeletal: Negative.   Skin: Negative.   Neurological: Negative.   Endo/Heme/Allergies: Negative.   Psychiatric/Behavioral: Negative.     Blood pressure 124/84, pulse 84, temperature 98.5 F (36.9 C), temperature source Oral, resp. rate 20, height 5\' 7"  (1.702 m), weight 158.7 kg (349 lb 13.9 oz), last menstrual period 08/01/2012, SpO2 97.00%. Physical Exam  Constitutional: She is oriented to person, place, and time. She appears well-developed and well-nourished.  Morbidly obese.  HENT:  Head: Normocephalic and atraumatic.  Neck: Normal range of motion. Neck supple.  Cardiovascular: Normal rate, regular rhythm and normal heart sounds.   Respiratory: Effort normal and breath sounds normal.  GI: Soft. Bowel sounds are normal. She exhibits no distension. There is no rebound.  Mild tenderness in right upper quadrant to deep palpation.  Neurological: She is alert and oriented to person, place, and time.  Skin: Skin is warm and dry.      Assessment/Plan Impression: Cholecystitis, cholelithiasis Plan: Patient scheduled for laparoscopic cholecystectomy on 08/08/2012. The risks and benefits of the procedure including bleeding, infection, hepatobiliary injury, the possibly of an open procedure were fully explained to the patient, who gave informed consent.  Kateri Balch A 08/03/2012, 7:54 AM

## 2012-08-03 NOTE — Progress Notes (Signed)
[facility] SURGICAL UNIT [id number] [place] [address] Phone: [phone] Fax: [phone]  Aug 03, 2012  Patient: Amanda Reyes  Date of Birth: Jun 26, 1985  Date of Visit: 08/01/2012    To Whom It May Concern:  Cortne Amara was seen and treated in our hospital on 08/01/2012 and discharged on 08/03/2012. Germaine Ripp  can return to work on 08/05/2012 but will require repeat hospitalization towards the end of May for further surgery..  Sincerely,

## 2012-08-03 NOTE — Progress Notes (Signed)
08/03/12 1358 Patient discharged home this afternoon. Reviewed discharge instructions with patient. Given copy of instructions, medication list, prescriptions, f/u information. Reviewed education sheets regarding gallstones and laparascopic cholecystectomy she will have as outpatient per patient request for information. Notified surgical services to contact her regarding pre-op plans for surgery as scheduled per Dr Lovell Sheehan. Pt verbalized understanding of instructions and when to call MD. IV site d/c'd and within normal limits. No c/o pain or discomfort prior to discharge. Note for work provided per Dr Karilyn Cota. Pt left floor in stable condition via w/c accompanied by nurse tech this afternoon. Earnstine Regal, RN

## 2012-08-05 ENCOUNTER — Encounter (HOSPITAL_COMMUNITY): Payer: Self-pay | Admitting: Pharmacy Technician

## 2012-08-06 ENCOUNTER — Encounter (HOSPITAL_COMMUNITY): Payer: Self-pay | Admitting: Internal Medicine

## 2012-08-06 ENCOUNTER — Encounter (HOSPITAL_COMMUNITY)
Admission: RE | Admit: 2012-08-06 | Discharge: 2012-08-06 | Disposition: A | Discharge status: Home / Self Care | Payer: No Typology Code available for payment source | Source: Ambulatory Visit | Attending: General Surgery | Admitting: General Surgery

## 2012-08-08 ENCOUNTER — Encounter (HOSPITAL_COMMUNITY)
Admission: RE | Disposition: A | Discharge status: Home / Self Care | Payer: Self-pay | Source: Ambulatory Visit | Attending: General Surgery

## 2012-08-08 ENCOUNTER — Ambulatory Visit (HOSPITAL_COMMUNITY): Payer: No Typology Code available for payment source | Admitting: Anesthesiology

## 2012-08-08 ENCOUNTER — Encounter (HOSPITAL_COMMUNITY): Payer: Self-pay | Admitting: Anesthesiology

## 2012-08-08 ENCOUNTER — Ambulatory Visit (HOSPITAL_COMMUNITY)
Admission: RE | Admit: 2012-08-08 | Discharge: 2012-08-08 | Disposition: A | Discharge status: Home / Self Care | Payer: No Typology Code available for payment source | Source: Ambulatory Visit | Attending: General Surgery | Admitting: General Surgery

## 2012-08-08 ENCOUNTER — Encounter (HOSPITAL_COMMUNITY): Payer: Self-pay | Admitting: *Deleted

## 2012-08-08 ENCOUNTER — Ambulatory Visit (HOSPITAL_COMMUNITY): Payer: No Typology Code available for payment source

## 2012-08-08 DIAGNOSIS — K8010 Calculus of gallbladder with chronic cholecystitis without obstruction: Secondary | ICD-10-CM | POA: Insufficient documentation

## 2012-08-08 HISTORY — PX: CHOLECYSTECTOMY: SHX55

## 2012-08-08 SURGERY — LAPAROSCOPIC CHOLECYSTECTOMY
Anesthesia: General | Site: Abdomen | Wound class: Clean Contaminated

## 2012-08-08 MED ORDER — LIDOCAINE HCL (PF) 1 % IJ SOLN
INTRAMUSCULAR | Status: AC
  Filled 2012-08-08: qty 5

## 2012-08-08 MED ORDER — NEOSTIGMINE METHYLSULFATE 1 MG/ML IJ SOLN
INTRAMUSCULAR | Status: AC
  Filled 2012-08-08: qty 1

## 2012-08-08 MED ORDER — FENTANYL CITRATE 0.05 MG/ML IJ SOLN
INTRAMUSCULAR | Status: AC
  Filled 2012-08-08: qty 2

## 2012-08-08 MED ORDER — ONDANSETRON HCL 4 MG/2ML IJ SOLN
4.0000 mg | Freq: Once | INTRAMUSCULAR | Status: AC
  Administered 2012-08-08: 4 mg via INTRAVENOUS

## 2012-08-08 MED ORDER — MIDAZOLAM HCL 2 MG/2ML IJ SOLN
INTRAMUSCULAR | Status: AC
  Filled 2012-08-08: qty 2

## 2012-08-08 MED ORDER — SODIUM CHLORIDE 0.9 % IR SOLN
Status: DC | PRN
  Administered 2012-08-08: 1000 mL

## 2012-08-08 MED ORDER — FENTANYL CITRATE 0.05 MG/ML IJ SOLN
INTRAMUSCULAR | Status: DC | PRN
  Administered 2012-08-08 (×5): 50 ug via INTRAVENOUS
  Administered 2012-08-08: 150 ug via INTRAVENOUS
  Administered 2012-08-08: 50 ug via INTRAVENOUS

## 2012-08-08 MED ORDER — LIDOCAINE HCL (CARDIAC) 20 MG/ML IV SOLN
INTRAVENOUS | Status: DC | PRN
  Administered 2012-08-08: 50 mg via INTRAVENOUS

## 2012-08-08 MED ORDER — SUCCINYLCHOLINE CHLORIDE 20 MG/ML IJ SOLN
INTRAMUSCULAR | Status: DC | PRN
  Administered 2012-08-08: 120 mg via INTRAVENOUS

## 2012-08-08 MED ORDER — FENTANYL CITRATE 0.05 MG/ML IJ SOLN
25.0000 ug | INTRAMUSCULAR | Status: DC | PRN
  Administered 2012-08-08 (×3): 50 ug via INTRAVENOUS

## 2012-08-08 MED ORDER — GLYCOPYRROLATE 0.2 MG/ML IJ SOLN
INTRAMUSCULAR | Status: DC | PRN
  Administered 2012-08-08: 0.6 mg via INTRAVENOUS

## 2012-08-08 MED ORDER — ENOXAPARIN SODIUM 40 MG/0.4ML ~~LOC~~ SOLN
40.0000 mg | Freq: Once | SUBCUTANEOUS | Status: AC
  Administered 2012-08-08: 40 mg via SUBCUTANEOUS

## 2012-08-08 MED ORDER — SUCCINYLCHOLINE CHLORIDE 20 MG/ML IJ SOLN
INTRAMUSCULAR | Status: AC
  Filled 2012-08-08: qty 1

## 2012-08-08 MED ORDER — GLYCOPYRROLATE 0.2 MG/ML IJ SOLN
INTRAMUSCULAR | Status: AC
  Filled 2012-08-08: qty 1

## 2012-08-08 MED ORDER — HEMOSTATIC AGENTS (NO CHARGE) OPTIME
TOPICAL | Status: DC | PRN
  Administered 2012-08-08: 1 via TOPICAL

## 2012-08-08 MED ORDER — PROPOFOL 10 MG/ML IV BOLUS
INTRAVENOUS | Status: DC | PRN
  Administered 2012-08-08: 150 mg via INTRAVENOUS

## 2012-08-08 MED ORDER — GLYCOPYRROLATE 0.2 MG/ML IJ SOLN
0.2000 mg | Freq: Once | INTRAMUSCULAR | Status: AC
  Administered 2012-08-08: 0.2 mg via INTRAVENOUS

## 2012-08-08 MED ORDER — MUPIROCIN 2 % EX OINT
TOPICAL_OINTMENT | CUTANEOUS | Status: AC
  Filled 2012-08-08: qty 22

## 2012-08-08 MED ORDER — NEOSTIGMINE METHYLSULFATE 1 MG/ML IJ SOLN
INTRAMUSCULAR | Status: DC | PRN
  Administered 2012-08-08: 4 mg via INTRAVENOUS

## 2012-08-08 MED ORDER — CLINDAMYCIN PHOSPHATE 900 MG/50ML IV SOLN
900.0000 mg | Freq: Once | INTRAVENOUS | Status: AC
  Administered 2012-08-08: 900 mg via INTRAVENOUS

## 2012-08-08 MED ORDER — ONDANSETRON HCL 4 MG/2ML IJ SOLN
INTRAMUSCULAR | Status: AC
  Filled 2012-08-08: qty 2

## 2012-08-08 MED ORDER — CLINDAMYCIN PHOSPHATE 900 MG/50ML IV SOLN
INTRAVENOUS | Status: AC
  Filled 2012-08-08: qty 50

## 2012-08-08 MED ORDER — ENOXAPARIN SODIUM 40 MG/0.4ML ~~LOC~~ SOLN
SUBCUTANEOUS | Status: AC
  Filled 2012-08-08: qty 0.4

## 2012-08-08 MED ORDER — ROCURONIUM BROMIDE 50 MG/5ML IV SOLN
INTRAVENOUS | Status: AC
  Filled 2012-08-08: qty 1

## 2012-08-08 MED ORDER — MIDAZOLAM HCL 2 MG/2ML IJ SOLN
1.0000 mg | INTRAMUSCULAR | Status: DC | PRN
  Administered 2012-08-08: 2 mg via INTRAVENOUS

## 2012-08-08 MED ORDER — PROPOFOL 10 MG/ML IV EMUL
INTRAVENOUS | Status: AC
  Filled 2012-08-08: qty 20

## 2012-08-08 MED ORDER — GLYCOPYRROLATE 0.2 MG/ML IJ SOLN
INTRAMUSCULAR | Status: AC
  Filled 2012-08-08: qty 3

## 2012-08-08 MED ORDER — ROCURONIUM BROMIDE 100 MG/10ML IV SOLN
INTRAVENOUS | Status: DC | PRN
  Administered 2012-08-08: 30 mg via INTRAVENOUS

## 2012-08-08 MED ORDER — LACTATED RINGERS IV SOLN
INTRAVENOUS | Status: DC | PRN
  Administered 2012-08-08 (×2): via INTRAVENOUS

## 2012-08-08 MED ORDER — MUPIROCIN 2 % EX OINT
TOPICAL_OINTMENT | Freq: Two times a day (BID) | CUTANEOUS | Status: DC
  Administered 2012-08-08: 1 via NASAL

## 2012-08-08 MED ORDER — FENTANYL CITRATE 0.05 MG/ML IJ SOLN
INTRAMUSCULAR | Status: AC
  Filled 2012-08-08: qty 5

## 2012-08-08 MED ORDER — BUPIVACAINE HCL (PF) 0.5 % IJ SOLN
INTRAMUSCULAR | Status: AC
  Filled 2012-08-08: qty 30

## 2012-08-08 MED ORDER — KETOROLAC TROMETHAMINE 30 MG/ML IJ SOLN
30.0000 mg | Freq: Once | INTRAMUSCULAR | Status: AC
  Administered 2012-08-08: 30 mg via INTRAVENOUS

## 2012-08-08 MED ORDER — OXYCODONE-ACETAMINOPHEN 7.5-325 MG PO TABS: 1.0000 | ORAL_TABLET | ORAL | Status: AC | PRN

## 2012-08-08 MED ORDER — ONDANSETRON HCL 4 MG/2ML IJ SOLN: 4.0000 mg | Freq: Once | INTRAMUSCULAR | Status: DC | PRN

## 2012-08-08 MED ORDER — LACTATED RINGERS IV SOLN
INTRAVENOUS | Status: DC
  Administered 2012-08-08: 09:00:00 via INTRAVENOUS

## 2012-08-08 MED ORDER — BUPIVACAINE HCL (PF) 0.5 % IJ SOLN
INTRAMUSCULAR | Status: DC | PRN
  Administered 2012-08-08: 10 mL

## 2012-08-08 MED ORDER — KETOROLAC TROMETHAMINE 30 MG/ML IJ SOLN
INTRAMUSCULAR | Status: AC
  Filled 2012-08-08: qty 1

## 2012-08-08 SURGICAL SUPPLY — 29 items
APPLIER CLIP LAPSCP 10X32 DD (CLIP) ×2 IMPLANT
BAG HAMPER (MISCELLANEOUS) ×2 IMPLANT
CLOTH BEACON ORANGE TIMEOUT ST (SAFETY) ×2 IMPLANT
COVER LIGHT HANDLE STERIS (MISCELLANEOUS) ×4 IMPLANT
DECANTER SPIKE VIAL GLASS SM (MISCELLANEOUS) ×2 IMPLANT
DURAPREP 26ML APPLICATOR (WOUND CARE) ×2 IMPLANT
ELECT REM PT RETURN 9FT ADLT (ELECTROSURGICAL) ×2
ELECTRODE REM PT RTRN 9FT ADLT (ELECTROSURGICAL) ×1 IMPLANT
FILTER SMOKE EVAC LAPAROSHD (FILTER) ×2 IMPLANT
FORMALIN 10 PREFIL 120ML (MISCELLANEOUS) ×2 IMPLANT
GLOVE BIO SURGEON STRL SZ7.5 (GLOVE) ×2 IMPLANT
GOWN STRL REIN XL XLG (GOWN DISPOSABLE) ×6 IMPLANT
HEMOSTAT SNOW SURGICEL 2X4 (HEMOSTASIS) ×2 IMPLANT
INST SET LAPROSCOPIC AP (KITS) ×2 IMPLANT
IV NS IRRIG 3000ML ARTHROMATIC (IV SOLUTION) IMPLANT
KIT ROOM TURNOVER APOR (KITS) ×2 IMPLANT
KIT TROCAR LAP CHOLE (TROCAR) ×2 IMPLANT
MANIFOLD NEPTUNE II (INSTRUMENTS) ×2 IMPLANT
NS IRRIG 1000ML POUR BTL (IV SOLUTION) ×2 IMPLANT
PACK LAP CHOLE LZT030E (CUSTOM PROCEDURE TRAY) ×2 IMPLANT
PAD ARMBOARD 7.5X6 YLW CONV (MISCELLANEOUS) ×2 IMPLANT
POUCH SPECIMEN RETRIEVAL 10MM (ENDOMECHANICALS) ×2 IMPLANT
SET BASIN LINEN APH (SET/KITS/TRAYS/PACK) ×2 IMPLANT
SET TUBE IRRIG SUCTION NO TIP (IRRIGATION / IRRIGATOR) IMPLANT
SPONGE GAUZE 2X2 8PLY STRL LF (GAUZE/BANDAGES/DRESSINGS) ×8 IMPLANT
STAPLER VISISTAT (STAPLE) ×2 IMPLANT
SUT VICRYL 0 UR6 27IN ABS (SUTURE) ×2 IMPLANT
WARMER LAPAROSCOPE (MISCELLANEOUS) ×2 IMPLANT
YANKAUER SUCT 12FT TUBE ARGYLE (SUCTIONS) ×2 IMPLANT

## 2012-08-08 NOTE — Anesthesia Procedure Notes (Signed)
Procedure Name: Intubation Date/Time: 08/08/2012 9:31 AM Performed by: Carolyne Littles, Crickett Abbett L Pre-anesthesia Checklist: Patient identified, Patient being monitored, Timeout performed, Emergency Drugs available and Suction available Patient Re-evaluated:Patient Re-evaluated prior to inductionOxygen Delivery Method: Circle System Utilized Preoxygenation: Pre-oxygenation with 100% oxygen Intubation Type: IV induction, Rapid sequence and Cricoid Pressure applied Laryngoscope Size: 3 and Miller Grade View: Grade I Tube type: Oral Tube size: 7.0 mm Number of attempts: 1 Airway Equipment and Method: stylet Placement Confirmation: ETT inserted through vocal cords under direct vision,  positive ETCO2 and breath sounds checked- equal and bilateral Secured at: 21 cm Tube secured with: Tape Dental Injury: Teeth and Oropharynx as per pre-operative assessment

## 2012-08-08 NOTE — Anesthesia Postprocedure Evaluation (Signed)
  Anesthesia Post-op Note  Patient: Amanda Reyes  Procedure(s) Performed: Procedure(s): LAPAROSCOPIC CHOLECYSTECTOMY (N/A)  Patient Location: PACU  Anesthesia Type:General  Level of Consciousness: sedated and patient cooperative  Airway and Oxygen Therapy: Patient Spontanous Breathing and Patient connected to face mask oxygen  Post-op Pain: none  Post-op Assessment: Post-op Vital signs reviewed, Patient's Cardiovascular Status Stable, Respiratory Function Stable, Patent Airway and No signs of Nausea or vomiting  Post-op Vital Signs: Reviewed and stable  Complications: No apparent anesthesia complications

## 2012-08-08 NOTE — Transfer of Care (Signed)
Immediate Anesthesia Transfer of Care Note  Patient: Amanda Reyes  Procedure(s) Performed: Procedure(s): LAPAROSCOPIC CHOLECYSTECTOMY (N/A)  Patient Location: PACU  Anesthesia Type:General  Level of Consciousness: sedated and patient cooperative  Airway & Oxygen Therapy: Patient Spontanous Breathing and Patient connected to face mask oxygen  Post-op Assessment: Report given to PACU RN and Post -op Vital signs reviewed and stable  Post vital signs: Reviewed and stable  Complications: No apparent anesthesia complications

## 2012-08-08 NOTE — Preoperative (Signed)
Beta Blockers   Reason not to administer Beta Blockers:Not Applicable 

## 2012-08-08 NOTE — Anesthesia Preprocedure Evaluation (Signed)
Anesthesia Evaluation  Patient identified by MRN, date of birth, ID band Patient awake    Reviewed: Allergy & Precautions, H&P , NPO status , Patient's Chart, lab work & pertinent test results  History of Anesthesia Complications Negative for: history of anesthetic complications  Airway Mallampati: I TM Distance: >3 FB Neck ROM: Full  Mouth opening: Limited Mouth Opening  Dental  (+) Teeth Intact   Pulmonary  breath sounds clear to auscultation        Cardiovascular negative cardio ROS  Rhythm:Regular Rate:Normal     Neuro/Psych negative neurological ROS     GI/Hepatic Nausea, but no vomiting with present problem   Endo/Other  Morbid obesity  Renal/GU negative Renal ROS     Musculoskeletal   Abdominal (+) + obese,   Peds  Hematology   Anesthesia Other Findings   Reproductive/Obstetrics                           Anesthesia Physical Anesthesia Plan  ASA: II  Anesthesia Plan: General   Post-op Pain Management:    Induction: Intravenous, Rapid sequence and Cricoid pressure planned  Airway Management Planned: Oral ETT  Additional Equipment:   Intra-op Plan:   Post-operative Plan: Extubation in OR  Informed Consent: I have reviewed the patients History and Physical, chart, labs and discussed the procedure including the risks, benefits and alternatives for the proposed anesthesia with the patient or authorized representative who has indicated his/her understanding and acceptance.     Plan Discussed with:   Anesthesia Plan Comments:         Anesthesia Quick Evaluation

## 2012-08-08 NOTE — Op Note (Signed)
Patient:  Amanda Reyes  DOB:  03-22-1985  MRN:  [id number]   Preop Diagnosis:  Cholecystitis, cholelithiasis  Postop Diagnosis:  Same  Procedure:  Laparoscopic cholecystectomy  Surgeon:  Franky Macho, M.D.  Anes:  General endotracheal  Indications:  Patient is a 27 year old black female who presented recently with cholecystitis, cholelithiasis, and choledocholithiasis. She underwent ERCP by Dr. Karilyn Cota and now presents for laparoscopic cholecystectomy. Risks and benefits of the procedure including bleeding, infection, hepatobiliary, the possibility of an open procedure were fully explained to the patient, who gave informed consent.  Procedure note:  The patient was placed in the supine position. After induction of general endotracheal anesthesia, the abdomen was prepped and draped using usual sterile technique with DuraPrep. Surgical site confirmation was performed.  An infraumbilical incision was made down to the fascia. A Veress needle was introduced into the abdominal cavity and confirmation of placement was done using the saline drop test. The abdomen was then insufflated to 16 mm mercury pressure. An 11 mm trocar was introduced into the abdominal cavity under direct visualization without difficulty. The patient was placed in reverse Trendelenburg position and additional 11 mm trocar was placed the epigastric region and 5 mm trochars were placed in the right upper quadrant and right flank regions. Liver was inspected and noted to be within normal limits. The gallbladder was retracted in a dynamic fashion in order to expose the triangle of Calot.  Dissection was begun around the infundibulum of the gallbladder. The cystic duct was first identified. Its juncture to the infundibulum was fully identified. Endoclips were placed proximally and distally on the cystic duct, and the cystic duct was divided. This was likewise done to the cystic artery. The gallbladder was then freed away from the  gallbladder fossa using Bovie electrocautery. The gallbladder was delivered through the epigastric trocar site using an Endo Catch bag. The gallbladder fossa was inspected and no abnormal bleeding or bile leakage was noted. Surgicel was placed in the gallbladder fossa. All fluid and air were then evacuated from the abdominal cavity prior to removal of the trochars.  All wounds were irrigated with normal saline. All wounds were injected with 0.5% Sensorcaine. The infraumbilical fashion as well as epigastric fascia were reapproximated using 0 Vicryl interrupted sutures. All skin incisions were closed using staples. Betadine ointment and dressed a dressings were applied.  All tape and needle counts were correct at the end of the procedure. Patient was extubated in the operating room and transferred to PACU in stable condition.  Complications:  None  EBL:  Minimal  Specimen:  Gallbladder

## 2012-08-08 NOTE — Interval H&P Note (Signed)
History and Physical Interval Note:  08/08/2012 8:46 AM  Amanda Reyes  has presented today for surgery, with the diagnosis of Cholecystitis, cholelithiasis  The various methods of treatment have been discussed with the patient and family. After consideration of risks, benefits and other options for treatment, the patient has consented to  Procedure(s): LAPAROSCOPIC CHOLECYSTECTOMY (N/A) as a surgical intervention .  The patient's history has been reviewed, patient examined, no change in status, stable for surgery.  I have reviewed the patient's chart and labs.  Questions were answered to the patient's satisfaction.     Franky Macho A

## 2012-08-08 NOTE — H&P (View-Only) (Signed)
Amanda Reyes is an 27 y.o. female.   Chief Complaint: Cholecystitis, cholelithiasis HPI: Patient is a 27-year-old morbidly obese black female who presented to [facility] with right upper quadrant abdominal pain and nausea. She was done an ultrasound the gallbladder to have cholelithiasis and possible choledocholithiasis. Her liver enzyme tests were elevated as well as her total bilirubin. Dr. Rehman of gastroenterology was consulted and she underwent an ERCP with multiple stone extraction from the common bile duct on 08/02/2012. She tolerated the procedure well. Her postoperative course had been unremarkable. She is now presenting tape and hospital for laparoscopic cholecystectomy.  History reviewed. No pertinent past medical history.  History reviewed. No pertinent past surgical history.  No family history on file. Social History:  reports that she has never smoked. She does not have any smokeless tobacco history on file. She reports that she does not drink alcohol or use illicit drugs.  Allergies:  Allergies  Allergen Reactions  . Penicillins Hives    No prescriptions prior to admission    Results for orders placed during the hospital encounter of 08/01/12 (from the past 48 hour(s))  CBC     Status: Abnormal   Collection Time    08/01/12  2:00 PM      Result Value Range   WBC 4.7  4.0 - 10.5 K/uL   RBC 4.64  3.87 - 5.11 MIL/uL   Hemoglobin 11.9 (*) 12.0 - 15.0 g/dL   HCT 36.6  36.0 - 46.0 %   MCV 78.9  78.0 - 100.0 fL   MCH 25.6 (*) 26.0 - 34.0 pg   MCHC 32.5  30.0 - 36.0 g/dL   RDW 15.5  11.5 - 15.5 %   Platelets 298  150 - 400 K/uL  COMPREHENSIVE METABOLIC PANEL     Status: Abnormal   Collection Time    08/01/12  2:00 PM      Result Value Range   Sodium 138  135 - 145 mEq/L   Potassium 3.9  3.5 - 5.1 mEq/L   Chloride 100  96 - 112 mEq/L   CO2 28  19 - 32 mEq/L   Glucose, Bld 100 (*) 70 - 99 mg/dL   BUN 9  6 - 23 mg/dL   Creatinine, Ser 0.84  0.50 - 1.10 mg/dL    Calcium 9.1  8.4 - 10.5 mg/dL   Total Protein 7.5  6.0 - 8.3 g/dL   Albumin 3.6  3.5 - 5.2 g/dL   AST 395 (*) 0 - 37 U/L   ALT 657 (*) 0 - 35 U/L   Alkaline Phosphatase 377 (*) 39 - 117 U/L   Total Bilirubin 2.8 (*) 0.3 - 1.2 mg/dL   GFR calc non Af Amer >90  >90 mL/min   GFR calc Af Amer >90  >90 mL/min   Comment:            The eGFR has been calculated     using the CKD EPI equation.     This calculation has not been     validated in all clinical     situations.     eGFR's persistently     <90 mL/min signify     possible Chronic Kidney Disease.  LIPASE, BLOOD     Status: None   Collection Time    08/01/12  2:00 PM      Result Value Range   Lipase 35  11 - 59 U/L  TROPONIN I     Status:   None   Collection Time    08/01/12  2:00 PM      Result Value Range   Troponin I <0.30  <0.30 ng/mL   Comment:            Due to the release kinetics of cTnI,     a negative result within the first hours     of the onset of symptoms does not rule out     myocardial infarction with certainty.     If myocardial infarction is still suspected,     repeat the test at appropriate intervals.  TSH     Status: None   Collection Time    08/01/12  2:00 PM      Result Value Range   TSH 1.948  0.350 - 4.500 uIU/mL  HEMOGLOBIN A1C     Status: None   Collection Time    08/01/12  2:00 PM      Result Value Range   Hemoglobin A1C 5.2  <5.7 %   Comment: (NOTE)                                                                               According to the ADA Clinical Practice Recommendations for 2011, when     HbA1c is used as a screening test:      >=6.5%   Diagnostic of Diabetes Mellitus               (if abnormal result is confirmed)     5.7-6.4%   Increased risk of developing Diabetes Mellitus     References:Diagnosis and Classification of Diabetes Mellitus,Diabetes     Care,2011,34(Suppl 1):S62-S69 and Standards of Medical Care in             Diabetes - 2011,Diabetes Care,2011,34 (Suppl  1):S11-S61.     CORRECTED ON 05/24 AT 2136: PREVIOUSLY REPORTED AS 5.2 Reference range: <5.7   Mean Plasma Glucose 103  <117 mg/dL  URINALYSIS, ROUTINE W REFLEX MICROSCOPIC     Status: Abnormal   Collection Time    08/01/12  2:01 PM      Result Value Range   Color, Urine ORANGE (*) YELLOW   Comment: BIOCHEMICALS MAY BE AFFECTED BY COLOR   APPearance CLEAR  CLEAR   Specific Gravity, Urine 1.020  1.005 - 1.030   pH 7.5  5.0 - 8.0   Glucose, UA 100 (*) NEGATIVE mg/dL   Hgb urine dipstick LARGE (*) NEGATIVE   Bilirubin Urine LARGE (*) NEGATIVE   Ketones, ur TRACE (*) NEGATIVE mg/dL   Protein, ur 30 (*) NEGATIVE mg/dL   Urobilinogen, UA 4.0 (*) 0.0 - 1.0 mg/dL   Nitrite NEGATIVE  NEGATIVE   Leukocytes, UA NEGATIVE  NEGATIVE  PREGNANCY, URINE     Status: None   Collection Time    08/01/12  2:01 PM      Result Value Range   Preg Test, Ur NEGATIVE  NEGATIVE   Comment:            THE SENSITIVITY OF THIS     METHODOLOGY IS >20 mIU/mL.  URINE MICROSCOPIC-ADD ON     Status: None   Collection Time    08/01/12    2:01 PM      Result Value Range   Squamous Epithelial / LPF RARE  RARE   RBC / HPF TOO NUMEROUS TO COUNT  <3 RBC/hpf  PROTIME-INR     Status: None   Collection Time    08/02/12  6:32 AM      Result Value Range   Prothrombin Time 13.1  11.6 - 15.2 seconds   INR 1.00  0.00 - 1.49  COMPREHENSIVE METABOLIC PANEL     Status: Abnormal   Collection Time    08/02/12  6:39 AM      Result Value Range   Sodium 137  135 - 145 mEq/L   Potassium 3.8  3.5 - 5.1 mEq/L   Chloride 101  96 - 112 mEq/L   CO2 26  19 - 32 mEq/L   Glucose, Bld 96  70 - 99 mg/dL   BUN 11  6 - 23 mg/dL   Creatinine, Ser 0.79  0.50 - 1.10 mg/dL   Calcium 8.8  8.4 - 10.5 mg/dL   Total Protein 6.4  6.0 - 8.3 g/dL   Albumin 3.0 (*) 3.5 - 5.2 g/dL   AST 228 (*) 0 - 37 U/L   ALT 530 (*) 0 - 35 U/L   Alkaline Phosphatase 361 (*) 39 - 117 U/L   Total Bilirubin 3.7 (*) 0.3 - 1.2 mg/dL   GFR calc non Af Amer >90  >90  mL/min   GFR calc Af Amer >90  >90 mL/min   Comment:            The eGFR has been calculated     using the CKD EPI equation.     This calculation has not been     validated in all clinical     situations.     eGFR's persistently     <90 mL/min signify     possible Chronic Kidney Disease.  CBC     Status: Abnormal   Collection Time    08/02/12  6:39 AM      Result Value Range   WBC 4.9  4.0 - 10.5 K/uL   RBC 4.28  3.87 - 5.11 MIL/uL   Hemoglobin 11.0 (*) 12.0 - 15.0 g/dL   HCT 33.8 (*) 36.0 - 46.0 %   MCV 79.0  78.0 - 100.0 fL   MCH 25.7 (*) 26.0 - 34.0 pg   MCHC 32.5  30.0 - 36.0 g/dL   RDW 15.6 (*) 11.5 - 15.5 %   Platelets 290  150 - 400 K/uL  CBC     Status: Abnormal   Collection Time    08/03/12  6:27 AM      Result Value Range   WBC 8.0  4.0 - 10.5 K/uL   RBC 4.13  3.87 - 5.11 MIL/uL   Hemoglobin 10.5 (*) 12.0 - 15.0 g/dL   HCT 32.7 (*) 36.0 - 46.0 %   MCV 79.2  78.0 - 100.0 fL   MCH 25.4 (*) 26.0 - 34.0 pg   MCHC 32.1  30.0 - 36.0 g/dL   RDW 15.8 (*) 11.5 - 15.5 %   Platelets 275  150 - 400 K/uL  COMPREHENSIVE METABOLIC PANEL     Status: Abnormal   Collection Time    08/03/12  6:27 AM      Result Value Range   Sodium 140  135 - 145 mEq/L   Potassium 3.4 (*) 3.5 - 5.1 mEq/L   Chloride 108  96 - 112   mEq/L   CO2 27  19 - 32 mEq/L   Glucose, Bld 99  70 - 99 mg/dL   BUN 7  6 - 23 mg/dL   Creatinine, Ser 0.78  0.50 - 1.10 mg/dL   Calcium 7.9 (*) 8.4 - 10.5 mg/dL   Total Protein 6.2  6.0 - 8.3 g/dL   Albumin 2.6 (*) 3.5 - 5.2 g/dL   AST 79 (*) 0 - 37 U/L   ALT 327 (*) 0 - 35 U/L   Alkaline Phosphatase 280 (*) 39 - 117 U/L   Total Bilirubin 0.9  0.3 - 1.2 mg/dL   GFR calc non Af Amer >90  >90 mL/min   GFR calc Af Amer >90  >90 mL/min   Comment:            The eGFR has been calculated     using the CKD EPI equation.     This calculation has not been     validated in all clinical     situations.     eGFR's persistently     <90 mL/min signify     possible  Chronic Kidney Disease.   Dg Chest 2 View  08/01/2012   *RADIOLOGY REPORT*  Clinical Data: Chest pain through to her back for 2-day  CHEST - 2 VIEW  Comparison: It is none  Findings: Enlargement of cardiac silhouette. Mediastinal contours and pulmonary vascularity normal. Minimal subsegmental atelectasis at right base. Lungs otherwise clear. No pleural effusion or pneumothorax. Bones unremarkable.  IMPRESSION: Enlargement of cardiac silhouette. Minimal right basilar atelectasis.   Original Report Authenticated By: Henny Strauch Boles, M.D.   Dg Ercp With Sphincterotomy  08/02/2012   *RADIOLOGY REPORT*  Clinical Data: Bile duct stones, basket removal of several stones, sphincterotomy, pancreatic duct stent placement.  Obesity.  ERCP  Comparison: Abdominal ultrasound - 08/01/2012  Fluoroscopy time:  3 minutes, 6 seconds  Findings:  Seven spot intraoperative fluoroscopic images of the right upper quadrant during ERCP are provided for review. Examination is degraded secondary to coned field of view and under penetration, presumably secondary to reported history of patient obesity.  Images demonstrate an ERCP probe overlying the right upper abdominal quadrant.  There is selective cannulation of the common bile duct as well as presumably the pancreatic duct (image labeled #8).  There is contrast opacification of the common bile duct which appears nondilated.  There is an apparent ill-defined filling defect within the distal aspect of the common bile duct.  Subsequent images demonstrate insufflation of a sphincterotomy balloon with the distal aspect of the common bile duct (image labeled #14).  IMPRESSION: ERCP with sphincterotomy as above.   Original Report Authenticated By: John Watts V, MD   Us Abdomen Limited Ruq  08/01/2012   *RADIOLOGY REPORT*  Clinical Data:  Gallstones, right upper quadrant pain  LIMITED ABDOMINAL ULTRASOUND - RIGHT UPPER QUADRANT  Comparison:  None.  Findings:  Gallbladder:  Multiple echogenic  foci with posterior acoustic shadowing are noted within the gallbladder consistent with cholelithiasis.  No gallbladder wall thickening or pericholecystic fluid.  Per the sonographer the sonographic Murphy's sign was negative.  Common bile duct:  The common bile duct is mildly dilated at 9 mm in caliber.  Liver:  Unremarkable sonographic appearance of the liver.  IMPRESSION:  1.  Cholelithiasis without sonographic findings to suggest acute cholecystitis.  2.  Borderline enlarged common bile duct at 9 mm in diameter.  No choledocholithiasis seen within the imaged portions.  The distal common bile   duct is not well seen secondary to obscuring bowel gas.                    Original Report Authenticated By: Heath McCullough, M.D.    Review of Systems  Constitutional: Positive for malaise/fatigue.  HENT: Negative.   Eyes: Negative.   Respiratory: Negative.   Cardiovascular: Negative.   Gastrointestinal: Positive for abdominal pain.  Genitourinary: Negative.   Musculoskeletal: Negative.   Skin: Negative.   Neurological: Negative.   Endo/Heme/Allergies: Negative.   Psychiatric/Behavioral: Negative.     Blood pressure 124/84, pulse 84, temperature 98.5 F (36.9 C), temperature source Oral, resp. rate 20, height 5' 7" (1.702 m), weight 158.7 kg (349 lb 13.9 oz), last menstrual period 08/01/2012, SpO2 97.00%. Physical Exam  Constitutional: She is oriented to person, place, and time. She appears well-developed and well-nourished.  Morbidly obese.  HENT:  Head: Normocephalic and atraumatic.  Neck: Normal range of motion. Neck supple.  Cardiovascular: Normal rate, regular rhythm and normal heart sounds.   Respiratory: Effort normal and breath sounds normal.  GI: Soft. Bowel sounds are normal. She exhibits no distension. There is no rebound.  Mild tenderness in right upper quadrant to deep palpation.  Neurological: She is alert and oriented to person, place, and time.  Skin: Skin is warm and dry.      Assessment/Plan Impression: Cholecystitis, cholelithiasis Plan: Patient scheduled for laparoscopic cholecystectomy on 08/08/2012. The risks and benefits of the procedure including bleeding, infection, hepatobiliary injury, the possibly of an open procedure were fully explained to the patient, who gave informed consent.  Yanin Muhlestein A 08/03/2012, 7:54 AM    

## 2012-08-11 ENCOUNTER — Encounter (HOSPITAL_COMMUNITY): Payer: Self-pay | Admitting: General Surgery

## 2012-10-04 ENCOUNTER — Emergency Department (HOSPITAL_COMMUNITY)
Admission: EM | Admit: 2012-10-04 | Discharge: 2012-10-04 | Disposition: A | Discharge status: Home / Self Care | Payer: MEDICAID | Attending: Emergency Medicine | Admitting: Emergency Medicine

## 2012-10-04 ENCOUNTER — Encounter (HOSPITAL_COMMUNITY): Payer: Self-pay | Admitting: *Deleted

## 2012-10-04 DIAGNOSIS — G5711 Meralgia paresthetica, right lower limb: Secondary | ICD-10-CM

## 2012-10-04 DIAGNOSIS — G5710 Meralgia paresthetica, unspecified lower limb: Secondary | ICD-10-CM | POA: Insufficient documentation

## 2012-10-04 DIAGNOSIS — E669 Obesity, unspecified: Secondary | ICD-10-CM | POA: Insufficient documentation

## 2012-10-04 DIAGNOSIS — M79609 Pain in unspecified limb: Secondary | ICD-10-CM | POA: Insufficient documentation

## 2012-10-04 DIAGNOSIS — Z880 Allergy status to penicillin: Secondary | ICD-10-CM | POA: Insufficient documentation

## 2012-10-04 NOTE — ED Notes (Addendum)
Pt c/o numbness to right upper thigh area that pt began to notice after she had gallbladder surgery on 08/08/2012. Pt states that at first the area was "hot" with numbness now she has different patches of numbness. Has spoken with pcp about the numbness, thought that it was related to the medication she was taking after surgery but has stopped the medicine still continues to have problems with the numbness, pt ambulatory to tx room with NAD noted.

## 2012-10-04 NOTE — ED Provider Notes (Signed)
Medical screening examination/treatment/procedure(s) were performed by non-physician practitioner and as supervising physician I was immediately available for consultation/collaboration.   Carleene Cooper III, MD 10/04/12 [phone]

## 2012-10-04 NOTE — ED Provider Notes (Signed)
CSN: [id number]     Arrival date & time 10/04/12  4098 History     First MD Initiated Contact with Patient 10/04/12 1102     Chief Complaint  Patient presents with  . Numbness   (Consider location/radiation/quality/duration/timing/severity/associated sxs/prior Treatment) Patient is a 27 y.o. female presenting with leg pain. The history is provided by the patient.  Leg Pain Location:  Leg Time since incident:  2 months Injury: no   Leg location:  R leg (lateral aspect upper) Pain details:    Quality:  Burning and sharp   Radiates to:  Does not radiate   Pain severity now: 6/10.   Onset quality:  Gradual   Duration:  2 months   Timing:  Constant   Progression:  Unchanged Prior injury to area:  No Relieved by:  Nothing Associated symptoms: numbness   Associated symptoms: no back pain, no decreased ROM, no fever and no neck pain   Risk factors: obesity    Amanda Reyes is a 27 y.o. female who presents to the ED with shooting pain in her right upper leg that started after her gall bladder surgery 2 months ago. When she went back to Dr. Rexene Edison after her surgery she mentioned it to him and he said could have been some of the mediation. She has continued to have the pain. The pain has not changed, she just decided to come in today to have it checked. She does not have a PCP.    History reviewed. No pertinent past medical history. Past Surgical History  Procedure Laterality Date  . Ercp N/A 08/02/2012    Procedure: ENDOSCOPIC RETROGRADE CHOLANGIOPANCREATOGRAPHY (ERCP);  Surgeon: Malissa Hippo, MD;  Location: [facility];  Service: Gastroenterology;  Laterality: N/A;  . Sphincterotomy N/A 08/02/2012    Procedure: SPHINCTEROTOMY;  Surgeon: Malissa Hippo, MD;  Location: [facility];  Service: Gastroenterology;  Laterality: N/A;  . Stone extraction with basket N/A 08/02/2012    Procedure: STONE EXTRACTION WITH BASKET;  Surgeon: Malissa Hippo, MD;  Location: [facility];  Service:  Gastroenterology;  Laterality: N/A;  . Pancreatic stent placement N/A 08/02/2012    Procedure: PANCREATIC STENT PLACEMENT;  Surgeon: Malissa Hippo, MD;  Location: [facility];  Service: Gastroenterology;  Laterality: N/A;  . Cholecystectomy N/A 08/08/2012    Procedure: LAPAROSCOPIC CHOLECYSTECTOMY;  Surgeon: Dalia Heading, MD;  Location: [facility];  Service: General;  Laterality: N/A;   No family history on file. History  Substance Use Topics  . Smoking status: Never Smoker   . Smokeless tobacco: Not on file  . Alcohol Use: No   OB History   Grav Para Term Preterm Abortions TAB SAB Ect Mult Living                 Review of Systems  Constitutional: Negative for fever.  HENT: Negative for neck pain.   Musculoskeletal: Negative for back pain.    Allergies  Penicillins  Home Medications   Current Outpatient Rx  Name  Route  Sig  Dispense  Refill  . ondansetron (ZOFRAN) 4 MG tablet   Oral   Take 1 tablet (4 mg total) by mouth every 6 (six) hours as needed for nausea.   20 tablet   0   . oxyCODONE-acetaminophen (PERCOCET) 7.5-325 MG per tablet   Oral   Take 1-2 tablets by mouth every 4 (four) hours as needed for pain.   50 tablet   0    BP 143/92  Pulse  82  Temp(Src) 98.4 F (36.9 C)  Resp 20  Ht 5\' 7"  (1.702 m)  Wt 343 lb (155.584 kg)  BMI 53.71 kg/m2  SpO2 100%  LMP 09/04/2012 Physical Exam  Nursing note and vitals reviewed. Constitutional: She is oriented to person, place, and time. No distress.  Obese   HENT:  Head: Normocephalic.  Eyes: EOM are normal.  Neck: Neck supple.  Cardiovascular: Normal rate and regular rhythm.   Pulmonary/Chest: Effort normal and breath sounds normal.  Abdominal: Soft. There is no tenderness.  Musculoskeletal:       Right upper leg: She exhibits no tenderness, no swelling, no deformity and no laceration.       Legs: Patient complains of decreased sensation right lateral upper leg. Unable to reproduce the burning pain she has.    Neurological: She is alert and oriented to person, place, and time. She has normal strength and normal reflexes. No cranial nerve deficit. Gait normal.  Pedal pulses present and equal.  Skin: Skin is warm and dry.  Psychiatric: She has a normal mood and affect. Her behavior is normal.    ED Course   Procedures  MDM  27 y.o. morbidly obese female with meralgia paresthetica  Discussed with the patient verbally and in witting diagnosis and plan of care. Patient voices understanding.    [place], [place] 10/04/12 [phone]

## 2013-09-04 ENCOUNTER — Emergency Department: Payer: Self-pay | Admitting: Internal Medicine

## 2013-09-10 ENCOUNTER — Emergency Department: Payer: Self-pay | Admitting: Emergency Medicine

## 2014-02-19 IMAGING — CR DG ABDOMEN 1V
2 series · 2 of 2 positions shown · non-contrast
Comparison: No priors.

CLINICAL DATA: Preoperative evaluation.

ABDOMEN - 1 VIEW

[view not recorded (1 of 2)]
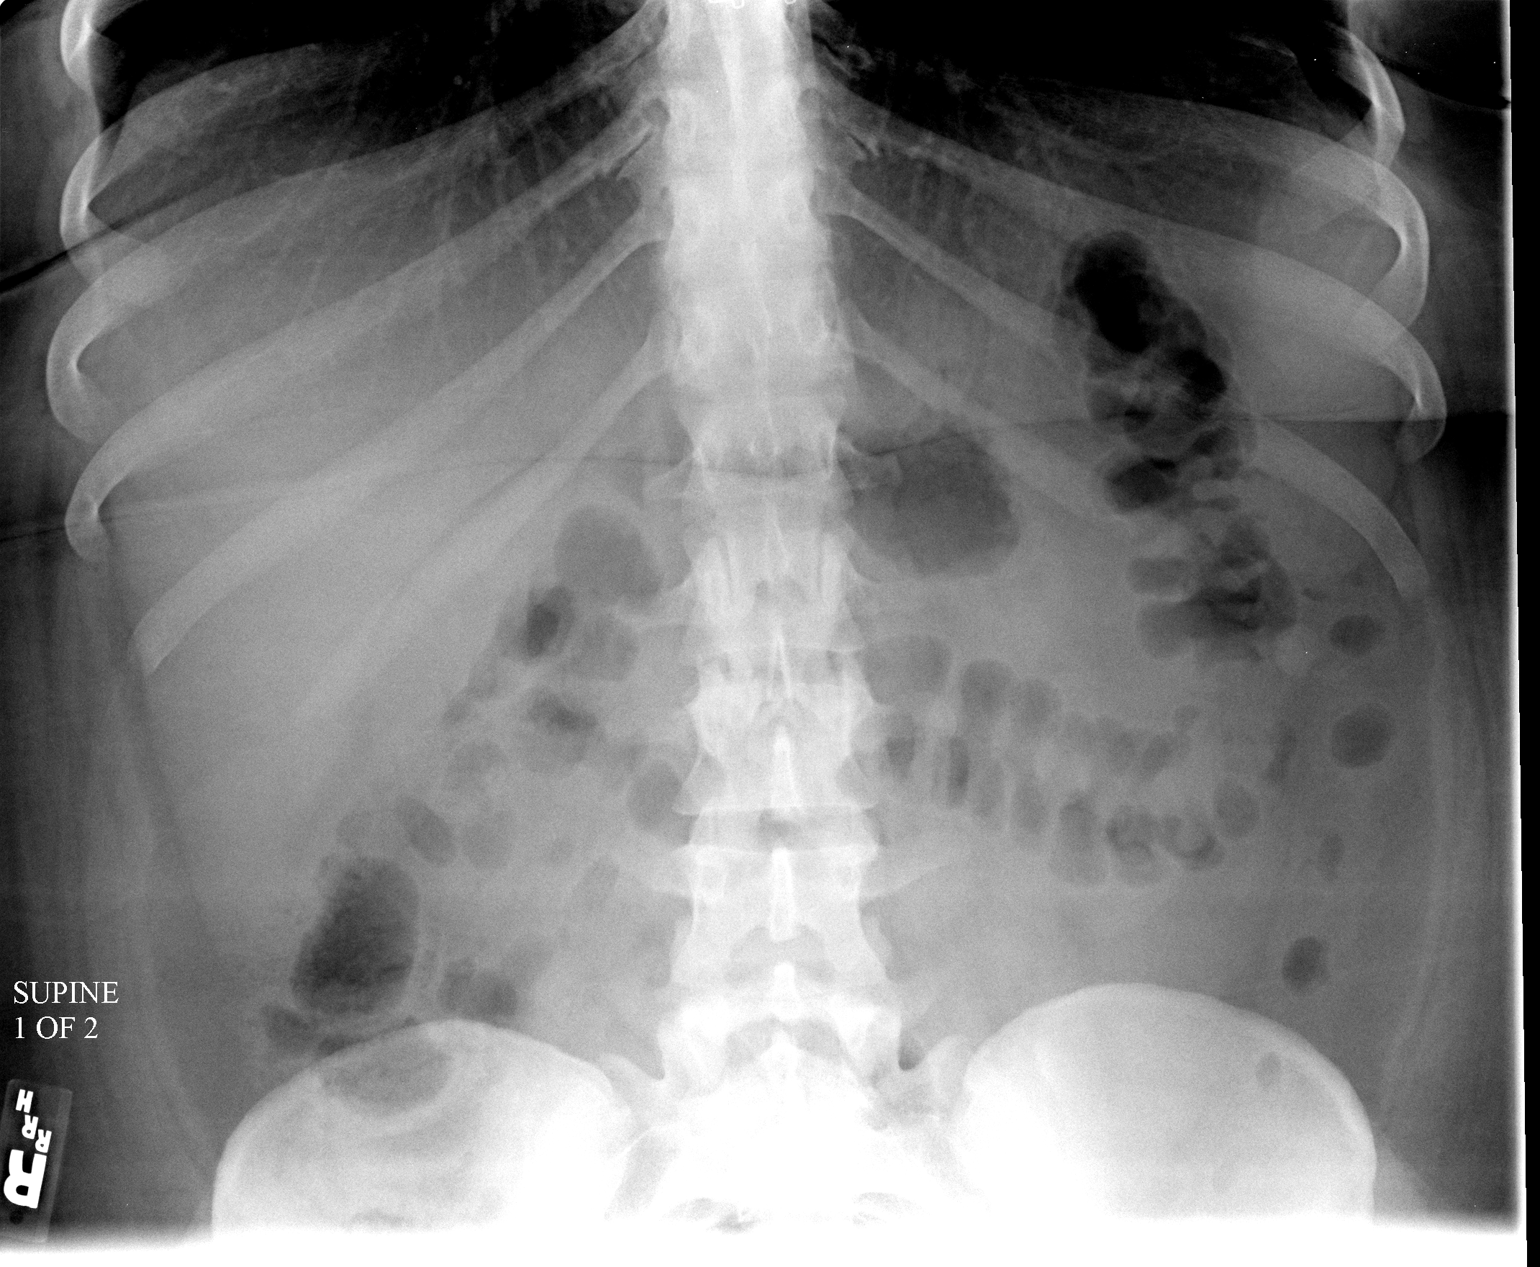

[view not recorded (2 of 2)]
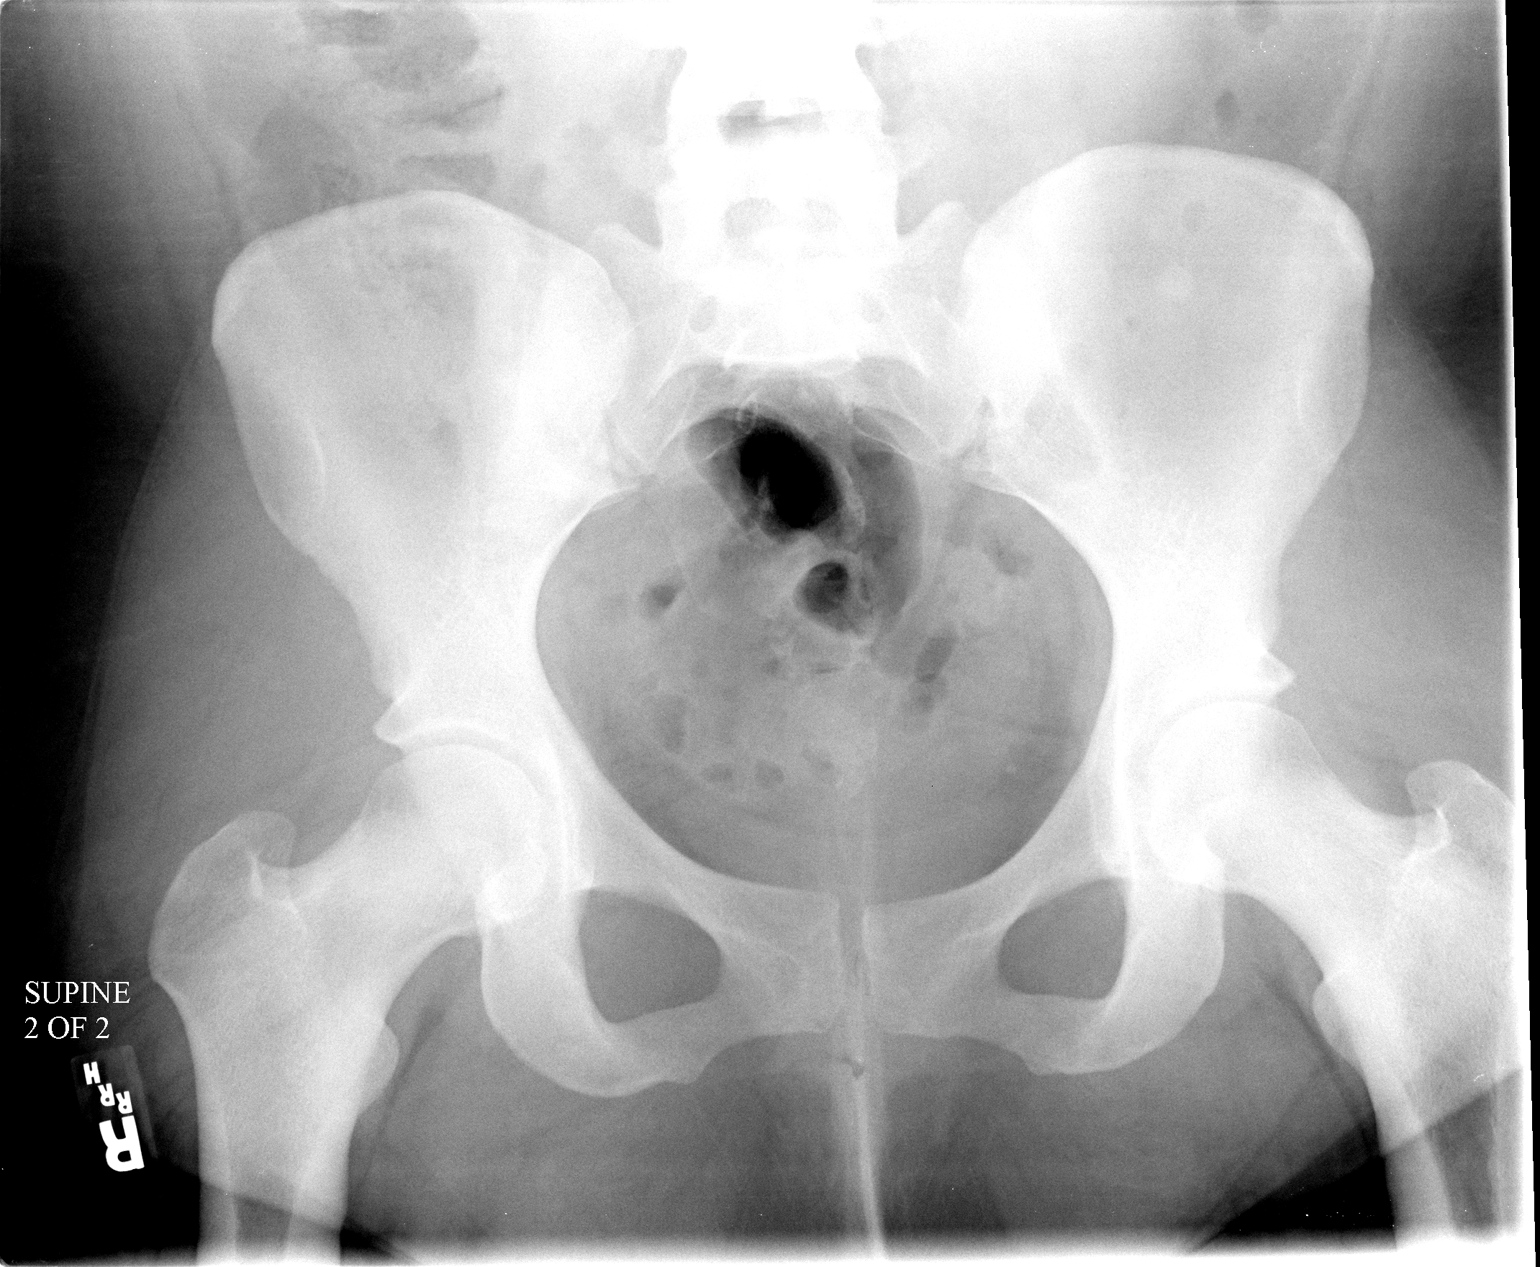

[2 of 2 positions shown; findings below may reference images not displayed]

FINDINGS: Gas and stool are seen scattered throughout the colon
extending to the level of the distal rectum.  No pathologic
distension of small bowel is noted.  No gross evidence of
pneumoperitoneum.
IMPRESSION: 1.  Nonobstructive bowel gas pattern.
2.  No pneumoperitoneum.

## 2014-03-25 ENCOUNTER — Encounter (HOSPITAL_COMMUNITY): Payer: Self-pay | Admitting: Internal Medicine

## 2017-10-28 ENCOUNTER — Other Ambulatory Visit: Payer: Self-pay

## 2017-10-28 ENCOUNTER — Emergency Department
Admission: EM | Admit: 2017-10-28 | Discharge: 2017-10-29 | Disposition: A | Discharge status: Home / Self Care | Payer: Self-pay | Attending: Emergency Medicine | Admitting: Emergency Medicine

## 2017-10-28 ENCOUNTER — Encounter: Payer: Self-pay | Admitting: Emergency Medicine

## 2017-10-28 DIAGNOSIS — I10 Essential (primary) hypertension: Secondary | ICD-10-CM

## 2017-10-28 DIAGNOSIS — R519 Headache, unspecified: Secondary | ICD-10-CM

## 2017-10-28 DIAGNOSIS — I160 Hypertensive urgency: Secondary | ICD-10-CM | POA: Insufficient documentation

## 2017-10-28 DIAGNOSIS — R51 Headache: Secondary | ICD-10-CM | POA: Insufficient documentation

## 2017-10-28 DIAGNOSIS — R42 Dizziness and giddiness: Secondary | ICD-10-CM | POA: Insufficient documentation

## 2017-10-28 HISTORY — DX: Essential (primary) hypertension: I10

## 2017-10-28 LAB — COMPREHENSIVE METABOLIC PANEL
ALT: 20 U/L (ref 0–44)
AST: 17 U/L (ref 15–41)
Albumin: 3.5 g/dL (ref 3.5–5.0)
Alkaline Phosphatase: 73 U/L (ref 38–126)
Anion gap: 4 — ABNORMAL LOW (ref 5–15)
BILIRUBIN TOTAL: 0.6 mg/dL (ref 0.3–1.2)
BUN: 14 mg/dL (ref 6–20)
CO2: 28 mmol/L (ref 22–32)
CREATININE: 0.77 mg/dL (ref 0.44–1.00)
Calcium: 8.6 mg/dL — ABNORMAL LOW (ref 8.9–10.3)
Chloride: 105 mmol/L (ref 98–111)
GFR calc Af Amer: >60 mL/min (ref >60)
GFR calc non Af Amer: >60 mL/min (ref >60)
GLUCOSE: 120 mg/dL — AB (ref 70–99)
Potassium: 3.7 mmol/L (ref 3.5–5.1)
Sodium: 137 mmol/L (ref 135–145)
TOTAL PROTEIN: 7.3 g/dL (ref 6.5–8.1)

## 2017-10-28 LAB — CBC
HEMATOCRIT: 37.4 % (ref 35.0–47.0)
HEMOGLOBIN: 12.3 g/dL (ref 12.0–16.0)
MCH: 24.9 pg — ABNORMAL LOW (ref 26.0–34.0)
MCHC: 32.8 g/dL (ref 32.0–36.0)
MCV: 75.9 fL — ABNORMAL LOW (ref 80.0–100.0)
Platelets: 358 10*3/uL (ref 150–440)
RBC: 4.93 MIL/uL (ref 3.80–5.20)
RDW: 16.8 % — ABNORMAL HIGH (ref 11.5–14.5)
WBC: 7.2 10*3/uL (ref 3.6–11.0)

## 2017-10-28 LAB — POCT PREGNANCY, URINE: PREG TEST UR: NEGATIVE

## 2017-10-28 NOTE — ED Triage Notes (Signed)
Pt arrived to the ED accompanied by family for complaints of high blood pressure and right sided tingling. Pt reports that she has bee having problems with her blood pressure and was prescribed medication that she is not taking. Today the Pt states that she" feels dizzy and is experiencing right sided tingling due to her high blood pressure." Pt is AOx4 in no apparent distress with no neurological deficiencies.

## 2017-10-29 ENCOUNTER — Emergency Department: Payer: Self-pay

## 2017-10-29 ENCOUNTER — Encounter: Payer: Self-pay | Admitting: Emergency Medicine

## 2017-10-29 LAB — URINALYSIS, COMPLETE (UACMP) WITH MICROSCOPIC
Bilirubin Urine: NEGATIVE
Glucose, UA: NEGATIVE mg/dL
HGB URINE DIPSTICK: NEGATIVE
Ketones, ur: NEGATIVE mg/dL
Nitrite: NEGATIVE
Protein, ur: NEGATIVE mg/dL
SPECIFIC GRAVITY, URINE: 1.021 (ref 1.005–1.030)
pH: 6 (ref 5.0–8.0)

## 2017-10-29 LAB — TROPONIN I

## 2017-10-29 MED ORDER — CLONIDINE HCL 0.1 MG/24HR TD PTWK
0.1000 mg | MEDICATED_PATCH | TRANSDERMAL | Status: DC
  Administered 2017-10-29: 0.1 mg via TRANSDERMAL
  Filled 2017-10-29: qty 1

## 2017-10-29 MED ORDER — CLONIDINE HCL 0.1 MG PO TABS
0.1000 mg | ORAL_TABLET | Freq: Once | ORAL | Status: AC
  Administered 2017-10-29: 0.1 mg via ORAL
  Filled 2017-10-29: qty 1

## 2017-10-29 MED ORDER — DIPHENHYDRAMINE HCL 50 MG/ML IJ SOLN
25.0000 mg | Freq: Once | INTRAMUSCULAR | Status: AC
  Administered 2017-10-29: 25 mg via INTRAVENOUS
  Filled 2017-10-29: qty 1

## 2017-10-29 MED ORDER — SODIUM CHLORIDE 0.9 % IV BOLUS
1000.0000 mL | Freq: Once | INTRAVENOUS | Status: AC
  Administered 2017-10-29: 1000 mL via INTRAVENOUS

## 2017-10-29 MED ORDER — METOCLOPRAMIDE HCL 5 MG/ML IJ SOLN
10.0000 mg | Freq: Once | INTRAMUSCULAR | Status: AC
  Administered 2017-10-29: 10 mg via INTRAVENOUS
  Filled 2017-10-29: qty 2

## 2017-10-29 MED ORDER — KETOROLAC TROMETHAMINE 30 MG/ML IJ SOLN
30.0000 mg | Freq: Once | INTRAMUSCULAR | Status: AC
  Administered 2017-10-29: 30 mg via INTRAVENOUS
  Filled 2017-10-29: qty 1

## 2017-10-29 NOTE — ED Provider Notes (Signed)
[facility] Emergency Department Provider Note   ____________________________________________   First MD Initiated Contact with Patient 10/29/17 [phone]     (approximate)  I have reviewed the triage vital signs and the nursing notes.   HISTORY  Chief Complaint Hypertension and Tingling    HPI Amanda Reyes is a 32 y.o. female who comes into the hospital today with some dizziness and lightheadedness as well as some chest tightness and numbness.  The patient states that she knows she has some blood pressure issues so she checked her pressure and it was 194/120.  The patient was laying in bed when it started.  It went on all day today as it started on Sunday.  The patient was concerned since it was persistent so she decided to come into the hospital.  The patient does not take her blood pressure medicines and has not taken it for months.  She states that it has been making her sick.  She tried to take some vinegar pills but reports that it did not help with the blood pressure and she still was feeling dizzy.  The patient has had a headache as well which she rates as an 8 out of 10 in intensity.  Her chest tightness is improved.  The patient states that she moved from [place] so she has not establish care with a primary care physician.  The patient's medicines include amlodipine and losartan/HCTZ.  She is here for evaluation.   Past Medical History:  Diagnosis Date  . Hypertension     Patient Active Problem List   Diagnosis Date Noted  . Cholelithiasis 08/01/2012  . Obesity 08/01/2012    Past Surgical History:  Procedure Laterality Date  . CHOLECYSTECTOMY N/A 08/08/2012   Procedure: LAPAROSCOPIC CHOLECYSTECTOMY;  Surgeon: Dalia HeadingMark A Jenkins, MD;  Location: [facility];  Service: General;  Laterality: N/A;  . ERCP N/A 08/02/2012   Procedure: ENDOSCOPIC RETROGRADE CHOLANGIOPANCREATOGRAPHY (ERCP);  Surgeon: Malissa HippoNajeeb U Rehman, MD;  Location: [facility];  Service:  Gastroenterology;  Laterality: N/A;  . PANCREATIC STENT PLACEMENT N/A 08/02/2012   Procedure: PANCREATIC STENT PLACEMENT;  Surgeon: Malissa HippoNajeeb U Rehman, MD;  Location: [facility];  Service: Gastroenterology;  Laterality: N/A;  . SPHINCTEROTOMY N/A 08/02/2012   Procedure: SPHINCTEROTOMY;  Surgeon: Malissa HippoNajeeb U Rehman, MD;  Location: [facility];  Service: Gastroenterology;  Laterality: N/A;  . STONE EXTRACTION WITH BASKET N/A 08/02/2012   Procedure: STONE EXTRACTION WITH BASKET;  Surgeon: Malissa HippoNajeeb U Rehman, MD;  Location: [facility];  Service: Gastroenterology;  Laterality: N/A;    Prior to Admission medications   Medication Sig Start Date End Date Taking? Authorizing Provider  ondansetron (ZOFRAN) 4 MG tablet Take 1 tablet (4 mg total) by mouth every 6 (six) hours as needed for nausea. 08/03/12   Wilson SingerGosrani, Nimish C, MD    Allergies Penicillins and Tomato  History reviewed. No pertinent family history.  Social History Social History   Tobacco Use  . Smoking status: Never Smoker  . Smokeless tobacco: Never Used  Substance Use Topics  . Alcohol use: No  . Drug use: No    Review of Systems  Constitutional: No fever/chills Eyes: No visual changes. ENT: No sore throat. Cardiovascular: Chest tightness. Respiratory: Denies shortness of breath. Gastrointestinal: No abdominal pain.  No nausea, no vomiting.   Genitourinary: Negative for dysuria. Musculoskeletal: Negative for back pain. Skin: Negative for rash. Neurological: Dizziness, lightheadedness and numbness   ____________________________________________   PHYSICAL EXAM:  VITAL SIGNS: ED Triage Vitals [10/28/17  2243]  Enc Vitals Group     BP (!) 169/120     Pulse Rate 94     Resp 16     Temp 98.8 F (37.1 C)     Temp Source Oral     SpO2 98 %     Weight (!) 346 lb (156.9 kg)     Height 5\' 8"  (1.727 m)     Head Circumference      Peak Flow      Pain Score 0     Pain Loc      Pain Edu?      Excl. in GC?     Constitutional: Alert and  oriented. Well appearing and in moderate distress. Eyes: Conjunctivae are normal. PERRL. EOMI. Head: Atraumatic. Nose: No congestion/rhinnorhea. Mouth/Throat: Mucous membranes are moist.  Oropharynx non-erythematous. Cardiovascular: Normal rate, regular rhythm. Grossly normal heart sounds.  Good peripheral circulation. Respiratory: Normal respiratory effort.  No retractions. Lungs CTAB. Gastrointestinal: Soft and nontender. No distention.  Positive bowel sounds Musculoskeletal: No lower extremity tenderness nor edema.   Neurologic:  Normal speech and language.  Skin:  Skin is warm, dry and intact.  Psychiatric: Mood and affect are normal.   ____________________________________________   LABS (all labs ordered are listed, but only abnormal results are displayed)  Labs Reviewed  CBC - Abnormal; Notable for the following components:      Result Value   MCV 75.9 (*)    MCH 24.9 (*)    RDW 16.8 (*)    All other components within normal limits  COMPREHENSIVE METABOLIC PANEL - Abnormal; Notable for the following components:   Glucose, Bld 120 (*)    Calcium 8.6 (*)    Anion gap 4 (*)    All other components within normal limits  URINALYSIS, COMPLETE (UACMP) WITH MICROSCOPIC - Abnormal; Notable for the following components:   Color, Urine YELLOW (*)    APPearance CLOUDY (*)    Leukocytes, UA TRACE (*)    Bacteria, UA RARE (*)    All other components within normal limits  TROPONIN I  POC URINE PREG, ED  POCT PREGNANCY, URINE   ____________________________________________  EKG  ED ECG REPORT I, Rebecka ApleyWebster,  Allison P, the attending physician, personally viewed and interpreted this ECG.   Date: 10/28/2017  EKG Time: 2252  Rate: 94  Rhythm: normal sinus rhythm  Axis: normal  Intervals:none  ST&T Change: flipped t waves in lead III, avF  ____________________________________________  RADIOLOGY  ED MD interpretation:  CT head: No CT evidence for acute intracranial  abnormality, mild cortical volume loss, somewhat prominent for age.  Official radiology report(s): Ct Head Wo Contrast  Result Date: 10/29/2017 CLINICAL DATA:  Headache EXAM: CT HEAD WITHOUT CONTRAST TECHNIQUE: Contiguous axial images were obtained from the base of the skull through the vertex without intravenous contrast. COMPARISON:  CT brain 08/15/2011 FINDINGS: Brain: No acute territorial infarction, hemorrhage, or intracranial mass is visualized. Mild cortical volume loss, prominent for age. Stable slightly prominent ventricles. Vascular: No hyperdense vessels.  No unexpected calcification Skull: Normal. Negative for fracture or focal lesion. Sinuses/Orbits: No acute finding. Other: None IMPRESSION: 1. No CT evidence for acute intracranial abnormality. 2. Mild cortical volume loss, somewhat prominent for age. Electronically Signed   By: Jasmine PangKim  Fujinaga M.D.   On: 10/29/2017 02:48    ____________________________________________   PROCEDURES  Procedure(s) performed: None  Procedures  Critical Care performed: No  ____________________________________________   INITIAL IMPRESSION / ASSESSMENT AND PLAN / ED COURSE  As part of my medical decision making, I reviewed the following data within the electronic MEDICAL RECORD NUMBER Notes from prior ED visits and [place] Controlled Substance Database   This is a 32 year old female who comes into the hospital today with some hypertension dizziness and lightheadedness.  The patient has not been taking her blood pressure medicines.  We did check some blood work to include a CBC, CMP and a troponin which were all negative.  I will give the patient a liter of normal saline some Reglan Benadryl and Toradol as well as a dose of clonidine.  Her blood pressure is 169/120 currently, but it seems like she may be having some hypertensive urgency.  The patient had no neurologic deficit or symptoms at this time.  She will be reassessed.  I will also do a CT scan of the  patient's head given her hypertension and symptoms to ensure that she does not have any hemorrhage or any other concern.  The patient CT scan is unremarkable.  The patient's symptoms are improved with some fluid and medication.  The patient will be discharged home.  I gave the patient a dose of clonidine and her blood pressure improved to the 120s over 90s.  I will place a clonidine patch on the patient and she will be discharged to follow-up with the acute care clinic.      ____________________________________________   FINAL CLINICAL IMPRESSION(S) / ED DIAGNOSES  Final diagnoses:  Hypertension, unspecified type  Dizziness  Hypertensive urgency  Acute nonintractable headache, unspecified headache type     ED Discharge Orders    None       Note:  This document was prepared using Dragon voice recognition software and may include unintentional dictation errors.    Rebecka Apley, MD 10/29/17 [id number]

## 2017-10-29 NOTE — Discharge Instructions (Signed)
Please follow-up with your primary care physician for further evaluation of your headache.  Please return with any worsening conditions or any other concerns.

## 2017-12-06 ENCOUNTER — Other Ambulatory Visit: Payer: Self-pay

## 2017-12-06 ENCOUNTER — Emergency Department
Admission: EM | Admit: 2017-12-06 | Discharge: 2017-12-06 | Disposition: A | Discharge status: Home / Self Care | Payer: Self-pay | Attending: Emergency Medicine | Admitting: Emergency Medicine

## 2017-12-06 ENCOUNTER — Encounter: Payer: Self-pay | Admitting: Emergency Medicine

## 2017-12-06 DIAGNOSIS — J020 Streptococcal pharyngitis: Secondary | ICD-10-CM | POA: Insufficient documentation

## 2017-12-06 DIAGNOSIS — I10 Essential (primary) hypertension: Secondary | ICD-10-CM | POA: Insufficient documentation

## 2017-12-06 LAB — GROUP A STREP BY PCR: GROUP A STREP BY PCR: DETECTED — AB

## 2017-12-06 MED ORDER — AZITHROMYCIN 250 MG PO TABS: ORAL_TABLET | ORAL | 0 refills | Status: DC

## 2017-12-06 NOTE — ED Triage Notes (Signed)
Patient complaining of right sided sore throat X 3 days.  Has used OTC meds without relief.  Denies known exposure to anyone with similar sx.  Alert and oriented, speech is clear.

## 2017-12-06 NOTE — ED Notes (Signed)
BP - unable to take with large cuff, patients upper arm is large.  Took on lower arm but unable to verify if correct.  169/122 right forearm.  Patient states she does have hypertension.

## 2017-12-06 NOTE — ED Notes (Signed)
Pt c/o sore throat, headache, generalized body pain x3 days - reports dry cough x2 days

## 2017-12-06 NOTE — Discharge Instructions (Signed)
Follow-up with your primary care provider or [facility] acute care if any continued problems.  Begin taking Zithromax for the next 6 days as directed.  Finish the entire pack.  You are contagious for 24 hours.  Take Tylenol or ibuprofen as needed for throat pain, fever, body aches. Increase fluids.

## 2017-12-06 NOTE — ED Provider Notes (Signed)
[facility] Emergency Department Provider Note   ____________________________________________   First MD Initiated Contact with Patient 12/06/17 0845     (approximate)  I have reviewed the triage vital signs and the nursing notes.   HISTORY  Chief Complaint Sore Throat   HPI Amanda Reyes is a 32 y.o. female presents to the ED with complaint of headache, sore throat, and general body ache for the last 3 days.  Patient states that 2 days ago she developed a dry cough.  Patient denies any known exposure to flu or strep throat.  She has not taken any over-the-counter medication.  She continues to eat and drink as normal.   Past Medical History:  Diagnosis Date  . Hypertension     Patient Active Problem List   Diagnosis Date Noted  . Cholelithiasis 08/01/2012  . Obesity 08/01/2012    Past Surgical History:  Procedure Laterality Date  . CHOLECYSTECTOMY N/A 08/08/2012   Procedure: LAPAROSCOPIC CHOLECYSTECTOMY;  Surgeon: Dalia Heading, MD;  Location: [facility];  Service: General;  Laterality: N/A;  . ERCP N/A 08/02/2012   Procedure: ENDOSCOPIC RETROGRADE CHOLANGIOPANCREATOGRAPHY (ERCP);  Surgeon: Malissa Hippo, MD;  Location: [facility];  Service: Gastroenterology;  Laterality: N/A;  . PANCREATIC STENT PLACEMENT N/A 08/02/2012   Procedure: PANCREATIC STENT PLACEMENT;  Surgeon: Malissa Hippo, MD;  Location: [facility];  Service: Gastroenterology;  Laterality: N/A;  . SPHINCTEROTOMY N/A 08/02/2012   Procedure: SPHINCTEROTOMY;  Surgeon: Malissa Hippo, MD;  Location: [facility];  Service: Gastroenterology;  Laterality: N/A;  . STONE EXTRACTION WITH BASKET N/A 08/02/2012   Procedure: STONE EXTRACTION WITH BASKET;  Surgeon: Malissa Hippo, MD;  Location: [facility];  Service: Gastroenterology;  Laterality: N/A;    Prior to Admission medications   Medication Sig Start Date End Date Taking? Authorizing Provider  azithromycin (ZITHROMAX Z-PAK) 250 MG tablet Take 2 tablets  (500 mg) on  Day 1,  followed by 1 tablet (250 mg) once daily on Days 2 through 5. 12/06/17   Tommi Rumps, PA-C    Allergies Penicillins and Tomato  No family history on file.  Social History Social History   Tobacco Use  . Smoking status: Never Smoker  . Smokeless tobacco: Never Used  Substance Use Topics  . Alcohol use: No  . Drug use: No    Review of Systems Constitutional: Subjective fever/chills Eyes: No visual changes. ENT: Positive sore throat.  Negative for ear pain. Cardiovascular: Denies chest pain. Respiratory: Denies shortness of breath. Gastrointestinal: No abdominal pain.  No nausea, no vomiting.  No diarrhea. Genitourinary: Negative for dysuria. Musculoskeletal: Positive for general body aches. Skin: Negative for rash. Neurological: Positive for headaches, negative for focal weakness or numbness. ___________________________________________   PHYSICAL EXAM:  VITAL SIGNS: ED Triage Vitals  Enc Vitals Group     BP --      Pulse Rate 12/06/17 0834 85     Resp 12/06/17 0834 20     Temp 12/06/17 0834 98.8 F (37.1 C)     Temp Source 12/06/17 0834 Oral     SpO2 12/06/17 0834 100 %     Weight 12/06/17 0837 (!) 340 lb (154.2 kg)     Height 12/06/17 0837 5\' 8"  (1.727 m)     Head Circumference --      Peak Flow --      Pain Score --      Pain Loc --      Pain Edu? --  Excl. in GC? --    Constitutional: Alert and oriented. Well appearing and in no acute distress.  Obese. Eyes: Conjunctivae are normal.  Head: Atraumatic. Nose: No congestion/rhinnorhea.  EACs and TMs are clear bilaterally. Mouth/Throat: Mucous membranes are moist.  Oropharynx erythematous with mild exudate noted.  Uvula is midline.  Patient is able to maintain saliva. Neck: No stridor.   Hematological/Lymphatic/Immunilogical: No cervical lymphadenopathy. Cardiovascular: Normal rate, regular rhythm. Grossly normal heart sounds.  Good peripheral circulation. Respiratory: Normal  respiratory effort.  No retractions. Lungs CTAB. Gastrointestinal: Soft and nontender. No distention.  Musculoskeletal: Moves upper and lower extremities without any difficulty.  Normal gait was noted. Neurologic:  Normal speech and language. No gross focal neurologic deficits are appreciated.  Skin:  Skin is warm, dry and intact. No rash noted. Psychiatric: Mood and affect are normal. Speech and behavior are normal.  ____________________________________________   LABS (all labs ordered are listed, but only abnormal results are displayed)  Labs Reviewed  GROUP A STREP BY PCR - Abnormal; Notable for the following components:      Result Value   Group A Strep by PCR DETECTED (*)    All other components within normal limits    PROCEDURES  Procedure(s) performed: None  Procedures  Critical Care performed: No  ____________________________________________   INITIAL IMPRESSION / ASSESSMENT AND PLAN / ED COURSE  As part of my medical decision making, I reviewed the following data within the electronic MEDICAL RECORD NUMBER   Patient presents to the ED with complaint of sore throat, headache, body aches for the last 3 days.  She is uncertain as to whether or not she has had a fever.  Physical exam is concerning for strep pharyngitis.  Tests confirms and is positive.  Patient states she is allergic to penicillin and was placed on Zithromax.  She is to follow-up with her PCP or [facility] acute care if any continued problems.  ____________________________________________   FINAL CLINICAL IMPRESSION(S) / ED DIAGNOSES  Final diagnoses:  Strep pharyngitis     ED Discharge Orders         Ordered    azithromycin (ZITHROMAX Z-PAK) 250 MG tablet     12/06/17 0948           Note:  This document was prepared using Dragon voice recognition software and may include unintentional dictation errors.    Tommi Rumps, PA-C 12/06/17 1209    Jene Every, MD 12/06/17  1224

## 2018-05-29 ENCOUNTER — Other Ambulatory Visit: Payer: Self-pay

## 2018-05-29 ENCOUNTER — Emergency Department: Payer: BLUE CROSS/BLUE SHIELD

## 2018-05-29 ENCOUNTER — Encounter: Payer: Self-pay | Admitting: Emergency Medicine

## 2018-05-29 ENCOUNTER — Emergency Department
Admission: EM | Admit: 2018-05-29 | Discharge: 2018-05-29 | Disposition: A | Discharge status: Home / Self Care | Payer: BLUE CROSS/BLUE SHIELD | Attending: Emergency Medicine | Admitting: Emergency Medicine

## 2018-05-29 DIAGNOSIS — Z9049 Acquired absence of other specified parts of digestive tract: Secondary | ICD-10-CM | POA: Diagnosis not present

## 2018-05-29 DIAGNOSIS — I10 Essential (primary) hypertension: Secondary | ICD-10-CM | POA: Insufficient documentation

## 2018-05-29 LAB — BASIC METABOLIC PANEL
Anion gap: 9 (ref 5–15)
BUN: 13 mg/dL (ref 6–20)
CO2: 26 mmol/L (ref 22–32)
Calcium: 8.4 mg/dL — ABNORMAL LOW (ref 8.9–10.3)
Chloride: 104 mmol/L (ref 98–111)
Creatinine, Ser: 1 mg/dL (ref 0.44–1.00)
GFR calc Af Amer: >60 mL/min (ref >60)
GLUCOSE: 121 mg/dL — AB (ref 70–99)
Potassium: 3.7 mmol/L (ref 3.5–5.1)
Sodium: 139 mmol/L (ref 135–145)

## 2018-05-29 LAB — CBC
HCT: 39.6 % (ref 36.0–46.0)
HEMOGLOBIN: 12.3 g/dL (ref 12.0–15.0)
MCH: 24.6 pg — ABNORMAL LOW (ref 26.0–34.0)
MCHC: 31.1 g/dL (ref 30.0–36.0)
MCV: 79 fL — ABNORMAL LOW (ref 80.0–100.0)
Platelets: 337 10*3/uL (ref 150–400)
RBC: 5.01 MIL/uL (ref 3.87–5.11)
RDW: 15.4 % (ref 11.5–15.5)
WBC: 6.4 10*3/uL (ref 4.0–10.5)
nRBC: 0 % (ref 0.0–0.2)

## 2018-05-29 LAB — TROPONIN I: Troponin I: <0.03 ng/mL (ref <0.03)

## 2018-05-29 MED ORDER — SODIUM CHLORIDE 0.9% FLUSH: 3.0000 mL | Freq: Once | INTRAVENOUS | Status: DC

## 2018-05-29 MED ORDER — LOSARTAN POTASSIUM-HCTZ 100-25 MG PO TABS: 1.0000 | ORAL_TABLET | Freq: Every day | ORAL | 2 refills | Status: DC

## 2018-05-29 MED ORDER — LOSARTAN POTASSIUM 50 MG PO TABS
100.0000 mg | ORAL_TABLET | Freq: Once | ORAL | Status: AC
  Administered 2018-05-29: 100 mg via ORAL
  Filled 2018-05-29: qty 2

## 2018-05-29 MED ORDER — HYDROCHLOROTHIAZIDE 25 MG PO TABS
25.0000 mg | ORAL_TABLET | Freq: Every day | ORAL | Status: DC
  Administered 2018-05-29: 25 mg via ORAL
  Filled 2018-05-29: qty 1

## 2018-05-29 NOTE — ED Notes (Signed)
MD at bedside. 

## 2018-05-29 NOTE — ED Provider Notes (Signed)
[facility] Emergency Department Provider Note  Time seen: 9:44 PM  I have reviewed the triage vital signs and the nursing notes.   HISTORY  Chief Complaint Hypertension   HPI Amanda Reyes is a 33 y.o. female with a past medical history of hypertension presents to the emergency department for hypertension, lightheadedness.  According to the patient for the past 1 year she has been off of her blood pressure medications.  States she has been trying to control blood pressure at home by eating better.  Patient states today while showering she became somewhat lightheaded so she went to urgent care.  At urgent care the patient had a blood pressure of 160/130 and was referred to the emergency department for further evaluation.  Patient did mention she was having some chest tightness however states this is been intermittent times several years when her blood pressure goes up.  Denies any chest discomfort or shortness of breath currently.  Largely negative review of systems at this time.  Blood pressure currently 144/111.   Past Medical History:  Diagnosis Date  . Hypertension     Patient Active Problem List   Diagnosis Date Noted  . Cholelithiasis 08/01/2012  . Obesity 08/01/2012    Past Surgical History:  Procedure Laterality Date  . CHOLECYSTECTOMY N/A 08/08/2012   Procedure: LAPAROSCOPIC CHOLECYSTECTOMY;  Surgeon: Dalia Heading, MD;  Location: [facility];  Service: General;  Laterality: N/A;  . ERCP N/A 08/02/2012   Procedure: ENDOSCOPIC RETROGRADE CHOLANGIOPANCREATOGRAPHY (ERCP);  Surgeon: Malissa Hippo, MD;  Location: [facility];  Service: Gastroenterology;  Laterality: N/A;  . PANCREATIC STENT PLACEMENT N/A 08/02/2012   Procedure: PANCREATIC STENT PLACEMENT;  Surgeon: Malissa Hippo, MD;  Location: [facility];  Service: Gastroenterology;  Laterality: N/A;  . SPHINCTEROTOMY N/A 08/02/2012   Procedure: SPHINCTEROTOMY;  Surgeon: Malissa Hippo, MD;  Location: [facility];   Service: Gastroenterology;  Laterality: N/A;  . STONE EXTRACTION WITH BASKET N/A 08/02/2012   Procedure: STONE EXTRACTION WITH BASKET;  Surgeon: Malissa Hippo, MD;  Location: [facility];  Service: Gastroenterology;  Laterality: N/A;    Prior to Admission medications   Medication Sig Start Date End Date Taking? Authorizing Provider  azithromycin (ZITHROMAX Z-PAK) 250 MG tablet Take 2 tablets (500 mg) on  Day 1,  followed by 1 tablet (250 mg) once daily on Days 2 through 5. 12/06/17   Tommi Rumps, PA-C    Allergies  Allergen Reactions  . Penicillins Hives  . Tomato Rash    No family history on file.  Social History Social History   Tobacco Use  . Smoking status: Never Smoker  . Smokeless tobacco: Never Used  Substance Use Topics  . Alcohol use: No  . Drug use: No    Review of Systems Constitutional: Negative for fever Cardiovascular: Negative for chest pain. Respiratory: Negative for shortness of breath. Gastrointestinal: Negative for abdominal pain, vomiting Musculoskeletal: Negative for musculoskeletal complaints Skin: Negative for skin complaints  Neurological: Negative for headache All other ROS negative  ____________________________________________   PHYSICAL EXAM:  VITAL SIGNS: ED Triage Vitals  Enc Vitals Group     BP 05/29/18 1750 (!) 164/107     Pulse Rate 05/29/18 1750 (!) 106     Resp 05/29/18 1750 16     Temp 05/29/18 1750 97.9 F (36.6 C)     Temp Source 05/29/18 1750 Oral     SpO2 05/29/18 2136 100 %     Weight --  Height --      Head Circumference --      Peak Flow --      Pain Score 05/29/18 1751 8     Pain Loc --      Pain Edu? --      Excl. in GC? --    Constitutional: Alert and oriented. Well appearing and in no distress. Eyes: Normal exam ENT   Head: Normocephalic and atraumatic.   Mouth/Throat: Mucous membranes are moist. Cardiovascular: Normal rate, regular rhythm. No murmur Respiratory: Normal respiratory effort  without tachypnea nor retractions. Breath sounds are clear Gastrointestinal: Soft and nontender. No distention. Musculoskeletal: Nontender with normal range of motion in all extremities.  Neurologic:  Normal speech and language. No gross focal neurologic deficits Skin:  Skin is warm, dry and intact.  Psychiatric: Mood and affect are normal.  ____________________________________________    EKG  EKG viewed and interpreted by myself shows sinus tachycardia 101 bpm with a narrow QRS, normal axis, normal intervals, nonspecific ST changes.  ____________________________________________    RADIOLOGY  Chest x-ray negative  ____________________________________________   INITIAL IMPRESSION / ASSESSMENT AND PLAN / ED COURSE  Pertinent labs & imaging results that were available during my care of the patient were reviewed by me and considered in my medical decision making (see chart for details).  Patient presents to the emergency department for hypertension found to have a blood pressure 160/130 at urgent care after being evaluated for lightheadedness.  I discussed this with the patient she has been off of her medications x1 year.  Patient states she was taking losartan 100 mg/hydrochlorothiazide 25 mg, amlodipine, hydralazine.  Patient's blood pressures currently 144/111.  We will check labs, EKG and chest x-ray given her intermittent chest tightness symptoms.  EKG is reassuring, chest x-ray is negative.  Labs are largely within normal limits including a negative troponin.  We will restart the patient's losartan/hydrochlorothiazide tablet.  Patient has a follow-up appointment next week with her PCP, we will defer further treatment and medications to them.  I discussed return precautions.  Patient agreeable to plan of care.  ____________________________________________   FINAL CLINICAL IMPRESSION(S) / ED DIAGNOSES  Hypertension   Minna Antis, MD 05/29/18 2147

## 2018-05-29 NOTE — ED Triage Notes (Addendum)
PT sent from UC for HTN of 161/138. PT states hx of HTN but hasn't taken meds x81yr. NAD noted. PT also states chest tightness today with dizziness and headache

## 2018-05-29 NOTE — ED Notes (Signed)
Patient transported to X-ray 

## 2018-05-29 NOTE — ED Notes (Signed)
Pt stated that she had not had her BP medication in a year and started feeling heart palpitations and dizzy today while in the shower. Pt states that she does not have any CP at this time.

## 2019-03-25 ENCOUNTER — Emergency Department: Payer: BC Managed Care – PPO

## 2019-03-25 ENCOUNTER — Other Ambulatory Visit: Payer: Self-pay

## 2019-03-25 DIAGNOSIS — I10 Essential (primary) hypertension: Secondary | ICD-10-CM | POA: Diagnosis present

## 2019-03-25 DIAGNOSIS — Z79899 Other long term (current) drug therapy: Secondary | ICD-10-CM | POA: Diagnosis not present

## 2019-03-25 LAB — BASIC METABOLIC PANEL
Anion gap: 9 (ref 5–15)
BUN: 12 mg/dL (ref 6–20)
CO2: 24 mmol/L (ref 22–32)
Calcium: 8.9 mg/dL (ref 8.9–10.3)
Chloride: 105 mmol/L (ref 98–111)
Creatinine, Ser: 1.07 mg/dL — ABNORMAL HIGH (ref 0.44–1.00)
GFR calc Af Amer: >60 mL/min (ref >60)
GFR calc non Af Amer: >60 mL/min (ref >60)
Glucose, Bld: 106 mg/dL — ABNORMAL HIGH (ref 70–99)
Potassium: 4.1 mmol/L (ref 3.5–5.1)
Sodium: 138 mmol/L (ref 135–145)

## 2019-03-25 LAB — TROPONIN I (HIGH SENSITIVITY): Troponin I (High Sensitivity): 6 ng/L (ref <18)

## 2019-03-25 NOTE — ED Triage Notes (Signed)
Pt presents via POV c/o HTN. Reports hx however incompliant with meds. Reports BP 180/119 PTA.

## 2019-03-26 ENCOUNTER — Emergency Department
Admission: EM | Admit: 2019-03-26 | Discharge: 2019-03-26 | Disposition: A | Discharge status: Home / Self Care | Payer: BC Managed Care – PPO | Attending: Student in an Organized Health Care Education/Training Program | Admitting: Student in an Organized Health Care Education/Training Program

## 2019-03-26 DIAGNOSIS — I10 Essential (primary) hypertension: Secondary | ICD-10-CM

## 2019-03-26 LAB — CBC
HCT: 40.8 % (ref 36.0–46.0)
Hemoglobin: 12.6 g/dL (ref 12.0–15.0)
MCH: 23.7 pg — ABNORMAL LOW (ref 26.0–34.0)
MCHC: 30.9 g/dL (ref 30.0–36.0)
MCV: 76.8 fL — ABNORMAL LOW (ref 80.0–100.0)
Platelets: 389 10*3/uL (ref 150–400)
RBC: 5.31 MIL/uL — ABNORMAL HIGH (ref 3.87–5.11)
RDW: 16 % — ABNORMAL HIGH (ref 11.5–15.5)
WBC: 7.3 10*3/uL (ref 4.0–10.5)
nRBC: 0 % (ref 0.0–0.2)

## 2019-03-26 LAB — TROPONIN I (HIGH SENSITIVITY): Troponin I (High Sensitivity): <2 ng/L (ref <18)

## 2019-03-26 MED ORDER — ONDANSETRON HCL 4 MG PO TABS: 4.0000 mg | ORAL_TABLET | Freq: Every day | ORAL | 0 refills | Status: AC | PRN

## 2019-03-26 MED ORDER — LOSARTAN POTASSIUM 50 MG PO TABS
50.0000 mg | ORAL_TABLET | Freq: Every day | ORAL | Status: DC
  Administered 2019-03-26: 50 mg via ORAL
  Filled 2019-03-26: qty 1

## 2019-03-26 MED ORDER — HYDROCHLOROTHIAZIDE 25 MG PO TABS
25.0000 mg | ORAL_TABLET | Freq: Every day | ORAL | Status: DC
  Administered 2019-03-26: 25 mg via ORAL
  Filled 2019-03-26: qty 1

## 2019-03-26 MED ORDER — LOSARTAN POTASSIUM-HCTZ 100-25 MG PO TABS: 1.0000 | ORAL_TABLET | Freq: Every day | ORAL | 2 refills | Status: AC

## 2019-03-26 NOTE — ED Notes (Signed)
BP was 197/117 at home. States she felt slight headache at home and nausea. Patient states these symptoms have subsided. Patient reports history of BP therapy at home but has no doctor at this time and has not taken BP meds in "a while".

## 2019-03-26 NOTE — ED Provider Notes (Signed)
[facility] Emergency Department Provider Note    First MD Initiated Contact with Patient 03/26/19 [phone]     (approximate)  I have reviewed the triage vital signs and the nursing notes.   HISTORY  Chief Complaint Hypertension    HPI Amanda Reyes is a 34 y.o. female is the ER for concern over elevated blood pressure.  Patient states that she has a long history of this but has run out of her antihypertensive medication.  Just recently moved to the area and is not been able to establish with PCP.  Said that she been having intermittent chest discomfort as well as lightheadedness and nausea which are symptoms consistent with her elevated blood pressure in the past.  Denies any active chest pain or pressure at this time.  No recent fevers.    Past Medical History:  Diagnosis Date  . Hypertension    History reviewed. No pertinent family history. Past Surgical History:  Procedure Laterality Date  . CHOLECYSTECTOMY N/A 08/08/2012   Procedure: LAPAROSCOPIC CHOLECYSTECTOMY;  Surgeon: Jamesetta So, MD;  Location: [facility];  Service: General;  Laterality: N/A;  . ERCP N/A 08/02/2012   Procedure: ENDOSCOPIC RETROGRADE CHOLANGIOPANCREATOGRAPHY (ERCP);  Surgeon: Rogene Houston, MD;  Location: [facility];  Service: Gastroenterology;  Laterality: N/A;  . PANCREATIC STENT PLACEMENT N/A 08/02/2012   Procedure: PANCREATIC STENT PLACEMENT;  Surgeon: Rogene Houston, MD;  Location: [facility];  Service: Gastroenterology;  Laterality: N/A;  . SPHINCTEROTOMY N/A 08/02/2012   Procedure: SPHINCTEROTOMY;  Surgeon: Rogene Houston, MD;  Location: [facility];  Service: Gastroenterology;  Laterality: N/A;  . STONE EXTRACTION WITH BASKET N/A 08/02/2012   Procedure: STONE EXTRACTION WITH BASKET;  Surgeon: Rogene Houston, MD;  Location: [facility];  Service: Gastroenterology;  Laterality: N/A;   Patient Active Problem List   Diagnosis Date Noted  . Cholelithiasis 08/01/2012  . Obesity 08/01/2012       Prior to Admission medications   Medication Sig Start Date End Date Taking? Authorizing Provider  azithromycin (ZITHROMAX Z-PAK) 250 MG tablet Take 2 tablets (500 mg) on  Day 1,  followed by 1 tablet (250 mg) once daily on Days 2 through 5. 12/06/17   Johnn Hai, PA-C  losartan-hydrochlorothiazide (HYZAAR) 100-25 MG tablet Take 1 tablet by mouth daily. 03/26/19 03/25/20  Merlyn Lot, MD    Allergies Penicillins and Tomato    Social History Social History   Tobacco Use  . Smoking status: Never Smoker  . Smokeless tobacco: Never Used  Substance Use Topics  . Alcohol use: No  . Drug use: No    Review of Systems Patient denies headaches, rhinorrhea, blurry vision, numbness, shortness of breath, chest pain, edema, cough, abdominal pain, nausea, vomiting, diarrhea, dysuria, fevers, rashes or hallucinations unless otherwise stated above in HPI. ____________________________________________   PHYSICAL EXAM:  VITAL SIGNS: Vitals:   03/25/19 2055 03/26/19 0213  BP: (!) 168/117 (!) 146/107  Pulse: 98 88  Resp: 14 20  Temp: 99.1 F (37.3 C) 98.8 F (37.1 C)  SpO2: 98% 97%    Constitutional: Alert and oriented.  Eyes: Conjunctivae are normal.  Head: Atraumatic. Nose: No congestion/rhinnorhea. Mouth/Throat: Mucous membranes are moist.   Neck: No stridor. Painless ROM.  Cardiovascular: Normal rate, regular rhythm. Grossly normal heart sounds.  Good peripheral circulation. Respiratory: Normal respiratory effort.  No retractions. Lungs CTAB. Gastrointestinal: Soft and nontender. No distention. No abdominal bruits. No CVA tenderness. Genitourinary:  Musculoskeletal: No lower extremity tenderness nor  edema.  No joint effusions. Neurologic:  Normal speech and language. No gross focal neurologic deficits are appreciated. No facial droop Skin:  Skin is warm, dry and intact. No rash noted. Psychiatric: Mood and affect are normal. Speech and behavior are  normal.  ____________________________________________   LABS (all labs ordered are listed, but only abnormal results are displayed)  Results for orders placed or performed during the hospital encounter of 03/26/19 (from the past 24 hour(s))  Basic metabolic panel     Status: Abnormal   Collection Time: 03/25/19  9:57 PM  Result Value Ref Range   Sodium 138 135 - 145 mmol/L   Potassium 4.1 3.5 - 5.1 mmol/L   Chloride 105 98 - 111 mmol/L   CO2 24 22 - 32 mmol/L   Glucose, Bld 106 (H) 70 - 99 mg/dL   BUN 12 6 - 20 mg/dL   Creatinine, Ser 9.48 (H) 0.44 - 1.00 mg/dL   Calcium 8.9 8.9 - 54.6 mg/dL   GFR calc non Af Amer >60 >60 mL/min   GFR calc Af Amer >60 >60 mL/min   Anion gap 9 5 - 15  Troponin I (High Sensitivity)     Status: None   Collection Time: 03/25/19  9:57 PM  Result Value Ref Range   Troponin I (High Sensitivity) 6 <18 ng/L  Troponin I (High Sensitivity)     Status: None   Collection Time: 03/26/19 12:39 AM  Result Value Ref Range   Troponin I (High Sensitivity) <2 <18 ng/L  CBC     Status: Abnormal   Collection Time: 03/26/19 12:39 AM  Result Value Ref Range   WBC 7.3 4.0 - 10.5 K/uL   RBC 5.31 (H) 3.87 - 5.11 MIL/uL   Hemoglobin 12.6 12.0 - 15.0 g/dL   HCT 27.0 35.0 - 09.3 %   MCV 76.8 (L) 80.0 - 100.0 fL   MCH 23.7 (L) 26.0 - 34.0 pg   MCHC 30.9 30.0 - 36.0 g/dL   RDW 81.8 (H) 29.9 - 37.1 %   Platelets 389 150 - 400 K/uL   nRBC 0.0 0.0 - 0.2 %   ____________________________________________  EKG My review and personal interpretation at Time: 21:33   Indication: htn  Rate: 90  Rhythm: sinus Axis: normal Other: normal intervals, no stemi ____________________________________________  RADIOLOGY  I personally reviewed all radiographic images ordered to evaluate for the above acute complaints and reviewed radiology reports and findings.  These findings were personally discussed with the patient.  Please see medical record for radiology  report.  ____________________________________________   PROCEDURES  Procedure(s) performed:  Procedures    Critical Care performed: no ____________________________________________   INITIAL IMPRESSION / ASSESSMENT AND PLAN / ED COURSE  Pertinent labs & imaging results that were available during my care of the patient were reviewed by me and considered in my medical decision making (see chart for details).   DDX: htnive urgency, essential htn, chf, aki, noncompliance  Shelitha L Hershberger is a 34 y.o. who presents to the ED with concern for elevated BP. Patient is AF,VSS with HTN in ED. Exam as above. Given current presentation have considered the above differential. no report of illicit drug use that could elevate BP.  Extensive evaluation of possible end organ damage pursued in ED. mp evidence of acute renal dysfunction. Neuro exam without focal deficits. EKG without evidence of ischemia. Trop neg. Renal function normal  . Not consistent with CHF, malignant htn, adrenergic crisis or hypertensive emergency.  Will write for referral for hyzaar and follow up with PCP for BP recheck. Discussed strict return parameters.      The patient was evaluated in Emergency Department today for the symptoms described in the history of present illness. He/she was evaluated in the context of the global COVID-19 pandemic, which necessitated consideration that the patient might be at risk for infection with the SARS-CoV-2 virus that causes COVID-19. Institutional protocols and algorithms that pertain to the evaluation of patients at risk for COVID-19 are in a state of rapid change based on information released by regulatory bodies including the CDC and federal and state organizations. These policies and algorithms were followed during the patient's care in the ED.  As part of my medical decision making, I reviewed the following data within the electronic MEDICAL RECORD NUMBER Nursing notes reviewed and  incorporated, Labs reviewed, notes from prior ED visits and [place] Controlled Substance Database   ____________________________________________   FINAL CLINICAL IMPRESSION(S) / ED DIAGNOSES  Final diagnoses:  Hypertension, unspecified type      NEW MEDICATIONS STARTED DURING THIS VISIT:  Current Discharge Medication List       Note:  This document was prepared using Dragon voice recognition software and may include unintentional dictation errors.    Willy Eddy, MD 03/26/19 [phone]

## 2019-11-09 ENCOUNTER — Other Ambulatory Visit: Payer: Self-pay

## 2019-11-09 ENCOUNTER — Emergency Department: Payer: BC Managed Care – PPO

## 2019-11-09 ENCOUNTER — Encounter: Payer: Self-pay | Admitting: Emergency Medicine

## 2019-11-09 ENCOUNTER — Inpatient Hospital Stay
Admission: EM | Admit: 2019-11-09 | Discharge: 2019-12-23 | DRG: 853 | Disposition: A | Discharge status: Home / Self Care | Payer: BC Managed Care – PPO | Attending: Surgery | Admitting: Surgery

## 2019-11-09 DIAGNOSIS — J9601 Acute respiratory failure with hypoxia: Secondary | ICD-10-CM | POA: Diagnosis not present

## 2019-11-09 DIAGNOSIS — R1084 Generalized abdominal pain: Secondary | ICD-10-CM | POA: Diagnosis present

## 2019-11-09 DIAGNOSIS — R6521 Severe sepsis with septic shock: Secondary | ICD-10-CM | POA: Diagnosis present

## 2019-11-09 DIAGNOSIS — D72829 Elevated white blood cell count, unspecified: Secondary | ICD-10-CM

## 2019-11-09 DIAGNOSIS — K5780 Diverticulitis of intestine, part unspecified, with perforation and abscess without bleeding: Secondary | ICD-10-CM

## 2019-11-09 DIAGNOSIS — K560 Paralytic ileus: Secondary | ICD-10-CM | POA: Diagnosis not present

## 2019-11-09 DIAGNOSIS — Z6841 Body Mass Index (BMI) 40.0 and over, adult: Secondary | ICD-10-CM | POA: Diagnosis not present

## 2019-11-09 DIAGNOSIS — R6520 Severe sepsis without septic shock: Secondary | ICD-10-CM

## 2019-11-09 DIAGNOSIS — E872 Acidosis, unspecified: Secondary | ICD-10-CM

## 2019-11-09 DIAGNOSIS — K9189 Other postprocedural complications and disorders of digestive system: Secondary | ICD-10-CM | POA: Diagnosis not present

## 2019-11-09 DIAGNOSIS — K651 Peritoneal abscess: Secondary | ICD-10-CM | POA: Diagnosis not present

## 2019-11-09 DIAGNOSIS — Z20822 Contact with and (suspected) exposure to covid-19: Secondary | ICD-10-CM | POA: Diagnosis present

## 2019-11-09 DIAGNOSIS — J9602 Acute respiratory failure with hypercapnia: Secondary | ICD-10-CM | POA: Diagnosis not present

## 2019-11-09 DIAGNOSIS — Z5329 Procedure and treatment not carried out because of patient's decision for other reasons: Secondary | ICD-10-CM | POA: Diagnosis not present

## 2019-11-09 DIAGNOSIS — I10 Essential (primary) hypertension: Secondary | ICD-10-CM | POA: Diagnosis not present

## 2019-11-09 DIAGNOSIS — E662 Morbid (severe) obesity with alveolar hypoventilation: Secondary | ICD-10-CM | POA: Diagnosis not present

## 2019-11-09 DIAGNOSIS — A419 Sepsis, unspecified organism: Secondary | ICD-10-CM | POA: Diagnosis present

## 2019-11-09 DIAGNOSIS — K750 Abscess of liver: Secondary | ICD-10-CM | POA: Diagnosis present

## 2019-11-09 DIAGNOSIS — R1110 Vomiting, unspecified: Secondary | ICD-10-CM | POA: Diagnosis not present

## 2019-11-09 DIAGNOSIS — R000 Tachycardia, unspecified: Secondary | ICD-10-CM | POA: Diagnosis not present

## 2019-11-09 DIAGNOSIS — E1165 Type 2 diabetes mellitus with hyperglycemia: Secondary | ICD-10-CM | POA: Diagnosis present

## 2019-11-09 DIAGNOSIS — N179 Acute kidney failure, unspecified: Secondary | ICD-10-CM | POA: Diagnosis not present

## 2019-11-09 DIAGNOSIS — R7303 Prediabetes: Secondary | ICD-10-CM | POA: Diagnosis not present

## 2019-11-09 DIAGNOSIS — K632 Fistula of intestine: Secondary | ICD-10-CM | POA: Diagnosis not present

## 2019-11-09 DIAGNOSIS — K660 Peritoneal adhesions (postprocedural) (postinfection): Secondary | ICD-10-CM | POA: Diagnosis present

## 2019-11-09 DIAGNOSIS — Z4659 Encounter for fitting and adjustment of other gastrointestinal appliance and device: Secondary | ICD-10-CM

## 2019-11-09 DIAGNOSIS — J95821 Acute postprocedural respiratory failure: Secondary | ICD-10-CM | POA: Diagnosis not present

## 2019-11-09 DIAGNOSIS — R601 Generalized edema: Secondary | ICD-10-CM | POA: Diagnosis present

## 2019-11-09 DIAGNOSIS — E871 Hypo-osmolality and hyponatremia: Secondary | ICD-10-CM | POA: Diagnosis not present

## 2019-11-09 DIAGNOSIS — K5720 Diverticulitis of large intestine with perforation and abscess without bleeding: Secondary | ICD-10-CM

## 2019-11-09 DIAGNOSIS — D649 Anemia, unspecified: Secondary | ICD-10-CM | POA: Diagnosis not present

## 2019-11-09 DIAGNOSIS — Z9049 Acquired absence of other specified parts of digestive tract: Secondary | ICD-10-CM

## 2019-11-09 DIAGNOSIS — J9600 Acute respiratory failure, unspecified whether with hypoxia or hypercapnia: Secondary | ICD-10-CM

## 2019-11-09 DIAGNOSIS — Z79899 Other long term (current) drug therapy: Secondary | ICD-10-CM

## 2019-11-09 DIAGNOSIS — R079 Chest pain, unspecified: Secondary | ICD-10-CM

## 2019-11-09 DIAGNOSIS — K5792 Diverticulitis of intestine, part unspecified, without perforation or abscess without bleeding: Secondary | ICD-10-CM | POA: Diagnosis not present

## 2019-11-09 DIAGNOSIS — R188 Other ascites: Secondary | ICD-10-CM

## 2019-11-09 DIAGNOSIS — E46 Unspecified protein-calorie malnutrition: Secondary | ICD-10-CM | POA: Diagnosis not present

## 2019-11-09 DIAGNOSIS — L8990 Pressure ulcer of unspecified site, unspecified stage: Secondary | ICD-10-CM | POA: Insufficient documentation

## 2019-11-09 DIAGNOSIS — R0682 Tachypnea, not elsewhere classified: Secondary | ICD-10-CM | POA: Diagnosis not present

## 2019-11-09 DIAGNOSIS — L89102 Pressure ulcer of unspecified part of back, stage 2: Secondary | ICD-10-CM | POA: Diagnosis not present

## 2019-11-09 DIAGNOSIS — Z880 Allergy status to penicillin: Secondary | ICD-10-CM

## 2019-11-09 DIAGNOSIS — Z91018 Allergy to other foods: Secondary | ICD-10-CM

## 2019-11-09 LAB — CBC
HCT: 39 % (ref 36.0–46.0)
Hemoglobin: 12 g/dL (ref 12.0–15.0)
MCH: 24.1 pg — ABNORMAL LOW (ref 26.0–34.0)
MCHC: 30.8 g/dL (ref 30.0–36.0)
MCV: 78.5 fL — ABNORMAL LOW (ref 80.0–100.0)
Platelets: 489 10*3/uL — ABNORMAL HIGH (ref 150–400)
RBC: 4.97 MIL/uL (ref 3.87–5.11)
RDW: 16.6 % — ABNORMAL HIGH (ref 11.5–15.5)
WBC: 13.5 10*3/uL — ABNORMAL HIGH (ref 4.0–10.5)
nRBC: 0 % (ref 0.0–0.2)

## 2019-11-09 LAB — PROTIME-INR
INR: 1.1 (ref 0.8–1.2)
Prothrombin Time: 13.8 seconds (ref 11.4–15.2)

## 2019-11-09 LAB — URINALYSIS, COMPLETE (UACMP) WITH MICROSCOPIC
Bacteria, UA: NONE SEEN
Bilirubin Urine: NEGATIVE
Glucose, UA: NEGATIVE mg/dL
Hgb urine dipstick: NEGATIVE
Ketones, ur: NEGATIVE mg/dL
Leukocytes,Ua: NEGATIVE
Nitrite: NEGATIVE
Protein, ur: 100 mg/dL — AB
Specific Gravity, Urine: 1.025 (ref 1.005–1.030)
pH: 5 (ref 5.0–8.0)

## 2019-11-09 LAB — COMPREHENSIVE METABOLIC PANEL
ALT: 30 U/L (ref 0–44)
AST: 21 U/L (ref 15–41)
Albumin: 3.5 g/dL (ref 3.5–5.0)
Alkaline Phosphatase: 105 U/L (ref 38–126)
Anion gap: 16 — ABNORMAL HIGH (ref 5–15)
BUN: 13 mg/dL (ref 6–20)
CO2: 25 mmol/L (ref 22–32)
Calcium: 8.6 mg/dL — ABNORMAL LOW (ref 8.9–10.3)
Chloride: 98 mmol/L (ref 98–111)
Creatinine, Ser: 1.75 mg/dL — ABNORMAL HIGH (ref 0.44–1.00)
GFR calc Af Amer: 44 mL/min — ABNORMAL LOW (ref >60)
GFR calc non Af Amer: 38 mL/min — ABNORMAL LOW (ref >60)
Glucose, Bld: 180 mg/dL — ABNORMAL HIGH (ref 70–99)
Potassium: 3.5 mmol/L (ref 3.5–5.1)
Sodium: 139 mmol/L (ref 135–145)
Total Bilirubin: 1 mg/dL (ref 0.3–1.2)
Total Protein: 8.6 g/dL — ABNORMAL HIGH (ref 6.5–8.1)

## 2019-11-09 LAB — SARS CORONAVIRUS 2 BY RT PCR (HOSPITAL ORDER, PERFORMED IN ~~LOC~~ HOSPITAL LAB): SARS Coronavirus 2: NEGATIVE

## 2019-11-09 LAB — GLUCOSE, CAPILLARY
Glucose-Capillary: 117 mg/dL — ABNORMAL HIGH (ref 70–99)
Glucose-Capillary: 156 mg/dL — ABNORMAL HIGH (ref 70–99)

## 2019-11-09 LAB — LACTIC ACID, PLASMA
Lactic Acid, Venous: 3.1 mmol/L (ref 0.5–1.9)
Lactic Acid, Venous: 2.7 mmol/L (ref 0.5–1.9)

## 2019-11-09 LAB — APTT: aPTT: 30 seconds (ref 24–36)

## 2019-11-09 LAB — LIPASE, BLOOD: Lipase: 22 U/L (ref 11–51)

## 2019-11-09 MED ORDER — METRONIDAZOLE IN NACL 5-0.79 MG/ML-% IV SOLN
500.0000 mg | Freq: Three times a day (TID) | INTRAVENOUS | Status: DC
  Administered 2019-11-10 – 2019-11-19 (×29): 500 mg via INTRAVENOUS
  Filled 2019-11-09 (×32): qty 100

## 2019-11-09 MED ORDER — HYDROMORPHONE HCL 1 MG/ML IJ SOLN
0.5000 mg | INTRAMUSCULAR | Status: DC | PRN
  Administered 2019-11-09 – 2019-11-10 (×6): 0.5 mg via INTRAVENOUS
  Filled 2019-11-09 (×2): qty 1
  Filled 2019-11-09: qty 0.5
  Filled 2019-11-09 (×2): qty 1
  Filled 2019-11-09: qty 0.5

## 2019-11-09 MED ORDER — SODIUM CHLORIDE 0.9 % IV SOLN: INTRAVENOUS | Status: AC

## 2019-11-09 MED ORDER — CIPROFLOXACIN IN D5W 400 MG/200ML IV SOLN
400.0000 mg | Freq: Two times a day (BID) | INTRAVENOUS | Status: DC
  Administered 2019-11-09 – 2019-11-19 (×20): 400 mg via INTRAVENOUS
  Filled 2019-11-09 (×22): qty 200

## 2019-11-09 MED ORDER — ONDANSETRON 4 MG PO TBDP
4.0000 mg | ORAL_TABLET | Freq: Four times a day (QID) | ORAL | Status: DC | PRN
  Administered 2019-11-15: 4 mg via ORAL
  Filled 2019-11-09 (×3): qty 1

## 2019-11-09 MED ORDER — METRONIDAZOLE IN NACL 5-0.79 MG/ML-% IV SOLN
500.0000 mg | Freq: Once | INTRAVENOUS | Status: AC
  Administered 2019-11-09: 500 mg via INTRAVENOUS
  Filled 2019-11-09: qty 100

## 2019-11-09 MED ORDER — HEPARIN SODIUM (PORCINE) 5000 UNIT/ML IJ SOLN
5000.0000 [IU] | Freq: Three times a day (TID) | INTRAMUSCULAR | Status: DC
  Administered 2019-11-09 – 2019-11-12 (×10): 5000 [IU] via SUBCUTANEOUS
  Filled 2019-11-09 (×10): qty 1

## 2019-11-09 MED ORDER — INSULIN ASPART 100 UNIT/ML ~~LOC~~ SOLN
0.0000 [IU] | SUBCUTANEOUS | Status: DC
  Administered 2019-11-09: 3 [IU] via SUBCUTANEOUS
  Administered 2019-11-10 – 2019-11-20 (×8): 2 [IU] via SUBCUTANEOUS
  Administered 2019-11-20 (×2): 3 [IU] via SUBCUTANEOUS
  Administered 2019-11-20: 2 [IU] via SUBCUTANEOUS
  Administered 2019-11-21 (×2): 3 [IU] via SUBCUTANEOUS
  Administered 2019-11-21: 5 [IU] via SUBCUTANEOUS
  Administered 2019-11-21 (×2): 2 [IU] via SUBCUTANEOUS
  Administered 2019-11-21 – 2019-11-22 (×3): 3 [IU] via SUBCUTANEOUS
  Administered 2019-11-22 (×3): 5 [IU] via SUBCUTANEOUS
  Administered 2019-11-23: 3 [IU] via SUBCUTANEOUS
  Administered 2019-11-23 (×3): 5 [IU] via SUBCUTANEOUS
  Administered 2019-11-23: 8 [IU] via SUBCUTANEOUS
  Administered 2019-11-23 – 2019-11-24 (×5): 5 [IU] via SUBCUTANEOUS
  Filled 2019-11-09 (×33): qty 1

## 2019-11-09 MED ORDER — CIPROFLOXACIN IN D5W 400 MG/200ML IV SOLN
400.0000 mg | Freq: Once | INTRAVENOUS | Status: AC
  Administered 2019-11-09: 400 mg via INTRAVENOUS
  Filled 2019-11-09: qty 200

## 2019-11-09 MED ORDER — IOHEXOL 300 MG/ML  SOLN
100.0000 mL | Freq: Once | INTRAMUSCULAR | Status: AC | PRN
  Administered 2019-11-09: 100 mL via INTRAVENOUS
  Filled 2019-11-09: qty 100

## 2019-11-09 MED ORDER — METHOCARBAMOL 500 MG PO TABS
500.0000 mg | ORAL_TABLET | Freq: Four times a day (QID) | ORAL | Status: DC | PRN
  Administered 2019-11-10 – 2019-12-15 (×15): 500 mg via ORAL
  Filled 2019-11-09 (×16): qty 1

## 2019-11-09 MED ORDER — SODIUM CHLORIDE 0.9 % IV BOLUS
1000.0000 mL | Freq: Once | INTRAVENOUS | Status: AC
  Administered 2019-11-09: 1000 mL via INTRAVENOUS

## 2019-11-09 MED ORDER — LACTATED RINGERS IV BOLUS
1000.0000 mL | Freq: Once | INTRAVENOUS | Status: AC
  Administered 2019-11-09: 1000 mL via INTRAVENOUS

## 2019-11-09 MED ORDER — ONDANSETRON HCL 4 MG/2ML IJ SOLN
4.0000 mg | Freq: Four times a day (QID) | INTRAMUSCULAR | Status: DC | PRN
  Administered 2019-11-09 – 2019-12-14 (×12): 4 mg via INTRAVENOUS
  Filled 2019-11-09 (×13): qty 2

## 2019-11-09 MED ORDER — SIMETHICONE 80 MG PO CHEW
40.0000 mg | CHEWABLE_TABLET | Freq: Four times a day (QID) | ORAL | Status: DC | PRN
  Filled 2019-11-09: qty 1

## 2019-11-09 MED ORDER — FENTANYL CITRATE (PF) 100 MCG/2ML IJ SOLN
50.0000 ug | INTRAMUSCULAR | Status: DC | PRN
  Administered 2019-11-09: 50 ug via INTRAVENOUS
  Filled 2019-11-09: qty 2

## 2019-11-09 NOTE — ED Triage Notes (Signed)
Pt in via EMS from home with c/o LLQ abd pain since 10:30 this am. Pt had gallbladder removed 5 years ago and no issues since. Pt reports pain is a stabbing pain.  Denies CP, SOB, HA or NV.FSBS 230, 138/75, 82 HR, 100% RA

## 2019-11-09 NOTE — ED Notes (Signed)
MD Katrinka Blazing to bedside to provide update to pt.

## 2019-11-09 NOTE — ED Triage Notes (Signed)
Arrived EMS for lower abdominal pain. Has had some urinary retention.  Appears in severe pain.  Unable to obtain urine even though feels urge to go.  No fever. No vomiting, + diarrhea.  Tachy in triage.

## 2019-11-09 NOTE — ED Provider Notes (Signed)
[facility] Emergency Department Provider Note  ____________________________________________   First MD Initiated Contact with Patient 11/09/19 1700     (approximate)  I have reviewed the triage vital signs and the nursing notes.   HISTORY  Chief Complaint Abdominal Pain   HPI Amanda Reyes is a 34 y.o. female with a past medical history of morbid obesity and hypertension who presents for assessment of 2 to 3 days of persistent worsening generalized abdominal pain that initially began 2 or 3 weeks ago but was initially only intermittent.  Patient also states she has not been able to urinate today.  She denies any headache, earache, sore throat, chest pain, cough, shortness of breath, fevers, vomiting, diarrhea, burning with urination, blood in her urine, rash, extremity pain, or other acute complaints.  No prior similar episodes.  Pain is exacerbated by any palpation or manipulation of her abdomen.  Denies EtOH or illicit drug use.  Does endorse prior cholecystectomy.         Past Medical History:  Diagnosis Date  . Hypertension     Patient Active Problem List   Diagnosis Date Noted  . Diverticulitis of colon with perforation 11/09/2019  . Cholelithiasis 08/01/2012  . Obesity 08/01/2012    Past Surgical History:  Procedure Laterality Date  . CHOLECYSTECTOMY N/A 08/08/2012   Procedure: LAPAROSCOPIC CHOLECYSTECTOMY;  Surgeon: Dalia Heading, MD;  Location: [facility];  Service: General;  Laterality: N/A;  . ERCP N/A 08/02/2012   Procedure: ENDOSCOPIC RETROGRADE CHOLANGIOPANCREATOGRAPHY (ERCP);  Surgeon: Malissa Hippo, MD;  Location: [facility];  Service: Gastroenterology;  Laterality: N/A;  . PANCREATIC STENT PLACEMENT N/A 08/02/2012   Procedure: PANCREATIC STENT PLACEMENT;  Surgeon: Malissa Hippo, MD;  Location: [facility];  Service: Gastroenterology;  Laterality: N/A;  . SPHINCTEROTOMY N/A 08/02/2012   Procedure: SPHINCTEROTOMY;  Surgeon: Malissa Hippo,  MD;  Location: [facility];  Service: Gastroenterology;  Laterality: N/A;  . STONE EXTRACTION WITH BASKET N/A 08/02/2012   Procedure: STONE EXTRACTION WITH BASKET;  Surgeon: Malissa Hippo, MD;  Location: [facility];  Service: Gastroenterology;  Laterality: N/A;    Prior to Admission medications   Medication Sig Start Date End Date Taking? Authorizing Provider  losartan-hydrochlorothiazide (HYZAAR) 100-25 MG tablet Take 1 tablet by mouth daily. 03/26/19 03/25/20 Yes Willy Eddy, MD  azithromycin (ZITHROMAX Z-PAK) 250 MG tablet Take 2 tablets (500 mg) on  Day 1,  followed by 1 tablet (250 mg) once daily on Days 2 through 5. Patient not taking: Reported on 11/09/2019 12/06/17   Bridget Hartshorn L, PA-C  ondansetron (ZOFRAN) 4 MG tablet Take 1 tablet (4 mg total) by mouth daily as needed. Patient not taking: Reported on 11/09/2019 03/26/19 03/25/20  Willy Eddy, MD    Allergies Penicillins and Tomato  History reviewed. No pertinent family history.  Social History Social History   Tobacco Use  . Smoking status: Never Smoker  . Smokeless tobacco: Never Used  Substance Use Topics  . Alcohol use: No  . Drug use: No    Review of Systems  Review of Systems  Constitutional: Negative for chills and fever.  HENT: Negative for sore throat.   Eyes: Negative for pain.  Respiratory: Negative for cough and stridor.   Cardiovascular: Negative for chest pain.  Gastrointestinal: Positive for abdominal pain. Negative for nausea and vomiting.  Genitourinary: Negative for dysuria.  Skin: Negative for rash.  Neurological: Negative for seizures, loss of consciousness and headaches.  Psychiatric/Behavioral: Negative for suicidal ideas.  All other systems reviewed and are negative.     ____________________________________________   PHYSICAL EXAM:  VITAL SIGNS: ED Triage Vitals  Enc Vitals Group     BP 11/09/19 1559 (!) 119/98     Pulse Rate 11/09/19 1558 (!) 130     Resp 11/09/19 1558 (!) 24      Temp 11/09/19 1558 98.6 F (37 C)     Temp Source 11/09/19 1558 Oral     SpO2 11/09/19 1558 99 %     Weight 11/09/19 1559 (!) 360 lb (163.3 kg)     Height 11/09/19 1559  (1.753 m)     Head Circumference --      Peak Flow --      Pain Score 11/09/19 1558 10     Pain Loc --      Pain Edu? --      Excl. in GC? --    Vitals:   11/09/19 1701 11/09/19 1830  BP: (!) 135/99 126/83  Pulse: (!) 132 (!) 139  Resp: (!) 24 (!) 22  Temp:    SpO2: 99% 100%   Physical Exam Vitals and nursing note reviewed.  Constitutional:      General: She is in acute distress.     Appearance: She is obese.  HENT:     Head: Normocephalic and atraumatic.     Right Ear: External ear normal.     Left Ear: External ear normal.     Nose: Nose normal.     Mouth/Throat:     Mouth: Mucous membranes are dry.  Eyes:     Conjunctiva/sclera: Conjunctivae normal.  Cardiovascular:     Rate and Rhythm: Normal rate and regular rhythm.     Heart sounds: No murmur heard.   Pulmonary:     Effort: Pulmonary effort is normal. No respiratory distress.     Breath sounds: Normal breath sounds.  Abdominal:     General: There is distension.     Palpations: Abdomen is soft.     Tenderness: There is generalized abdominal tenderness. There is guarding. There is no right CVA tenderness or left CVA tenderness.  Musculoskeletal:     Cervical back: Neck supple.  Skin:    General: Skin is warm and dry.     Capillary Refill: Capillary refill takes more than 3 seconds.  Neurological:     Mental Status: She is alert and oriented to person, place, and time.      ____________________________________________   LABS (all labs ordered are listed, but only abnormal results are displayed)  Labs Reviewed  COMPREHENSIVE METABOLIC PANEL - Abnormal; Notable for the following components:      Result Value   Glucose, Bld 180 (*)    Creatinine, Ser 1.75 (*)    Calcium 8.6 (*)    Total Protein 8.6 (*)    GFR calc non Af  Amer 38 (*)    GFR calc Af Amer 44 (*)    Anion gap 16 (*)    All other components within normal limits  CBC - Abnormal; Notable for the following components:   WBC 13.5 (*)    MCV 78.5 (*)    MCH 24.1 (*)    RDW 16.6 (*)    Platelets 489 (*)    All other components within normal limits  URINALYSIS, COMPLETE (UACMP) WITH MICROSCOPIC - Abnormal; Notable for the following components:   Color, Urine AMBER (*)    APPearance HAZY (*)    Protein, ur 100 (*)  All other components within normal limits  LACTIC ACID, PLASMA - Abnormal; Notable for the following components:   Lactic Acid, Venous 3.1 (*)    All other components within normal limits  GLUCOSE, CAPILLARY - Abnormal; Notable for the following components:   Glucose-Capillary 117 (*)    All other components within normal limits  SARS CORONAVIRUS 2 BY RT PCR (HOSPITAL ORDER, PERFORMED IN [facility])  CULTURE, BLOOD (ROUTINE X 2)  CULTURE, BLOOD (ROUTINE X 2)  URINE CULTURE  LIPASE, BLOOD  PROTIME-INR  APTT  LACTIC ACID, PLASMA  HIV ANTIBODY (ROUTINE TESTING W REFLEX)  CBC  BASIC METABOLIC PANEL  MAGNESIUM  PHOSPHORUS  HEMOGLOBIN A1C  POC URINE PREG, ED   ____________________________________________  EKG  ECG was sinus tachycardia with a ventricular rate of 135, normal axis, unremarkable intervals, no clear evidence of acute ischemia or other significant underlying arrhythmia. ____________________________________________  RADIOLOGY  ED MD interpretation: Chest x-ray shows no evidence of pneumonia, pneumothorax, edema, or other acute intrathoracic process.  CT with findings concerning for acute perforated diverticulitis.  Official radiology report(s): DG Chest 1 View  Result Date: 11/09/2019 CLINICAL DATA:  LEFT-sided pain. EXAM: CHEST  1 VIEW COMPARISON:  03/25/2019 chest radiograph FINDINGS: The cardiomediastinal silhouette is unremarkable. Mild peribronchial thickening again noted. There is no  evidence of focal airspace disease, pulmonary edema, suspicious pulmonary nodule/mass, pleural effusion, or pneumothorax. No acute bony abnormalities are identified. IMPRESSION: No active disease. Electronically Signed   By: Harmon PierJeffrey  Hu M.D.   On: 11/09/2019 18:18   CT ABDOMEN PELVIS W CONTRAST  Result Date: 11/09/2019 CLINICAL DATA:  Abdominal pain EXAM: CT ABDOMEN AND PELVIS WITH CONTRAST TECHNIQUE: Multidetector CT imaging of the abdomen and pelvis was performed using the standard protocol following bolus administration of intravenous contrast. CONTRAST:  100mL OMNIPAQUE IOHEXOL 300 MG/ML  SOLN COMPARISON:  None. FINDINGS: Lower chest: Lung bases demonstrate no acute consolidation or effusion. Normal cardiac size. Hepatobiliary: No focal liver abnormality is seen. Status post cholecystectomy. No biliary dilatation. Pancreas: Unremarkable. No pancreatic ductal dilatation or surrounding inflammatory changes. Spleen: Normal in size without focal abnormality. Adrenals/Urinary Tract: Adrenal glands are normal. Kidneys show no hydronephrosis. Small cyst in the upper pole of the right kidney. Foley catheter in the bladder which is decompressed. Stomach/Bowel: The stomach is nonenlarged. No dilated small bowel. Negative appendix. Prominent submucosal fat deposition at the colon consistent with chronic inflammatory process. Left colon diverticular disease. Focal wall thickening and inflammatory change at the sigmoid colon. More focal inflammatory process at the sigmoid colon with soft tissue stranding and small foci of gas, consistent with perforation. No well organized abscess at this time. Vascular/Lymphatic: Nonaneurysmal aorta.  No suspicious adenopathy. Reproductive: Uterus and bilateral adnexa are unremarkable. Other: Multiple foci of free air within the abdomen, for example series 2, image number 33, series 2, image number 37, series 2, image number 39/40, series 2, image number 50, and small focus of free air  near the umbilicus. Small amount of mildly complex fluid in the pelvis without strong rim enhancement to suggest organized abscess at this time. Musculoskeletal: No acute or significant osseous findings. IMPRESSION: 1. Focal wall thickening and inflammatory change at the sigmoid colon with extraluminal gas collection and considerable focal inflammatory change, consistent with acute perforated diverticulitis. There is a small focus of free air in the umbilical region. 2. Small pelvic fluid collection with slight increased density values though without strong rim enhancement at this time to suggest organized pelvic abscess. Critical  Value/emergent results were called by telephone at the time of interpretation on 11/09/2019 at 6:02 pm to provider [facility] , who verbally acknowledged these results. Electronically Signed   By: Jasmine Pang M.D.   On: 11/09/2019 18:02    ____________________________________________   PROCEDURES  Procedure(s) performed (including Critical Care):  .1-3 Lead EKG Interpretation Performed by: Gilles Chiquito, MD Authorized by: Gilles Chiquito, MD     Interpretation: normal     ECG rate assessment: tachycardic     Rhythm: sinus rhythm     Ectopy: none     Conduction: normal   .Critical Care Performed by: Gilles Chiquito, MD Authorized by: Gilles Chiquito, MD   Critical care provider statement:    Critical care time (minutes):  75   Critical care was necessary to treat or prevent imminent or life-threatening deterioration of the following conditions:  Sepsis   Critical care was time spent personally by me on the following activities:  Discussions with consultants, evaluation of patient's response to treatment, examination of patient, ordering and performing treatments and interventions, ordering and review of laboratory studies, ordering and review of radiographic studies, pulse oximetry, re-evaluation of patient's condition, obtaining history from patient or  surrogate and review of old charts     ____________________________________________   INITIAL IMPRESSION / ASSESSMENT AND PLAN / ED COURSE        Overall patient's history, exam, and ED work-up is concerning for sepsis secondary to perforated diverticulitis.  Patient is noted to be tachypneic, tachycardic with a distended tender abdomen and elevated white blood cell count associated with elevated lactic acid on arrival.  Code sepsis initiated with broad-spectrum antibiotics and IV fluid resuscitation.  General surgery service was immediately consulted after CT resulted.  CT abdomen pelvis does not show evidence of appendicitis, pyelonephritis, acute pancreatitis, or other acute intra-abdominal pathology.  No evidence on CT or CMP of acute cholestatic process.  Urine pregnancy test is negative.  UA is not consistent with cystitis.  We'll plan to admit to general surgery service for further evaluation management.  ____________________________________________   FINAL CLINICAL IMPRESSION(S) / ED DIAGNOSES  Final diagnoses:  Sepsis with acute organ dysfunction, due to unspecified organism, unspecified type, unspecified whether septic shock present (HCC)  Perforated diverticulum  Morbid obesity (HCC)  AKI (acute kidney injury) (HCC)  Lactic acid acidosis  Tachycardia  Leukocytosis, unspecified type    Medications  fentaNYL (SUBLIMAZE) injection 50 mcg (50 mcg Intravenous Given 11/09/19 1610)  metroNIDAZOLE (FLAGYL) IVPB 500 mg (500 mg Intravenous New Bag/Given 11/09/19 2008)  heparin injection 5,000 Units (has no administration in time range)  0.9 %  sodium chloride infusion (has no administration in time range)  ciprofloxacin (CIPRO) IVPB 400 mg (has no administration in time range)    And  metroNIDAZOLE (FLAGYL) IVPB 500 mg (has no administration in time range)  HYDROmorphone (DILAUDID) injection 0.5 mg (0.5 mg Intravenous Given 11/09/19 1837)  methocarbamol (ROBAXIN) tablet 500 mg  (has no administration in time range)  ondansetron (ZOFRAN-ODT) disintegrating tablet 4 mg ( Oral See Alternative 11/09/19 1837)    Or  ondansetron (ZOFRAN) injection 4 mg (4 mg Intravenous Given 11/09/19 1837)  simethicone (MYLICON) chewable tablet 40 mg (has no administration in time range)  insulin aspart (novoLOG) injection 0-15 Units (0 Units Subcutaneous Not Given 11/09/19 2004)  lactated ringers bolus 1,000 mL (0 mLs Intravenous Stopped 11/09/19 1946)  iohexol (OMNIPAQUE) 300 MG/ML solution 100 mL (100 mLs Intravenous Contrast Given 11/09/19 1717)  ciprofloxacin (CIPRO) IVPB 400 mg (0 mg Intravenous Stopped 11/09/19 1946)  lactated ringers bolus 1,000 mL (1,000 mLs Intravenous New Bag/Given 11/09/19 1947)  sodium chloride 0.9 % bolus 1,000 mL (0 mLs Intravenous Stopped 11/09/19 1954)     ED Discharge Orders    None       Note:  This document was prepared using Dragon voice recognition software and may include unintentional dictation errors.   Gilles Chiquito, MD 11/09/19 2017

## 2019-11-10 ENCOUNTER — Inpatient Hospital Stay: Payer: BC Managed Care – PPO

## 2019-11-10 LAB — BASIC METABOLIC PANEL
Anion gap: 9 (ref 5–15)
Anion gap: 15 (ref 5–15)
BUN: 21 mg/dL — ABNORMAL HIGH (ref 6–20)
BUN: 21 mg/dL — ABNORMAL HIGH (ref 6–20)
CO2: 23 mmol/L (ref 22–32)
CO2: 18 mmol/L — ABNORMAL LOW (ref 22–32)
Calcium: 7.6 mg/dL — ABNORMAL LOW (ref 8.9–10.3)
Calcium: 7.4 mg/dL — ABNORMAL LOW (ref 8.9–10.3)
Chloride: 105 mmol/L (ref 98–111)
Chloride: 104 mmol/L (ref 98–111)
Creatinine, Ser: 1.81 mg/dL — ABNORMAL HIGH (ref 0.44–1.00)
Creatinine, Ser: 1.65 mg/dL — ABNORMAL HIGH (ref 0.44–1.00)
GFR calc Af Amer: 42 mL/min — ABNORMAL LOW (ref >60)
GFR calc Af Amer: 47 mL/min — ABNORMAL LOW (ref >60)
GFR calc non Af Amer: 36 mL/min — ABNORMAL LOW (ref >60)
GFR calc non Af Amer: 40 mL/min — ABNORMAL LOW (ref >60)
Glucose, Bld: 129 mg/dL — ABNORMAL HIGH (ref 70–99)
Glucose, Bld: 118 mg/dL — ABNORMAL HIGH (ref 70–99)
Potassium: 3.8 mmol/L (ref 3.5–5.1)
Potassium: 4.6 mmol/L (ref 3.5–5.1)
Sodium: 137 mmol/L (ref 135–145)
Sodium: 137 mmol/L (ref 135–145)

## 2019-11-10 LAB — GLUCOSE, CAPILLARY
Glucose-Capillary: 107 mg/dL — ABNORMAL HIGH (ref 70–99)
Glucose-Capillary: 114 mg/dL — ABNORMAL HIGH (ref 70–99)
Glucose-Capillary: 120 mg/dL — ABNORMAL HIGH (ref 70–99)
Glucose-Capillary: 138 mg/dL — ABNORMAL HIGH (ref 70–99)
Glucose-Capillary: 84 mg/dL (ref 70–99)
Glucose-Capillary: 87 mg/dL (ref 70–99)

## 2019-11-10 LAB — MAGNESIUM: Magnesium: 1.8 mg/dL (ref 1.7–2.4)

## 2019-11-10 LAB — LACTIC ACID, PLASMA
Lactic Acid, Venous: 1.2 mmol/L (ref 0.5–1.9)
Lactic Acid, Venous: 1.5 mmol/L (ref 0.5–1.9)
Lactic Acid, Venous: 1.7 mmol/L (ref 0.5–1.9)

## 2019-11-10 LAB — CBC
HCT: 33.7 % — ABNORMAL LOW (ref 36.0–46.0)
Hemoglobin: 10.8 g/dL — ABNORMAL LOW (ref 12.0–15.0)
MCH: 24.3 pg — ABNORMAL LOW (ref 26.0–34.0)
MCHC: 32 g/dL (ref 30.0–36.0)
MCV: 75.9 fL — ABNORMAL LOW (ref 80.0–100.0)
Platelets: 389 10*3/uL (ref 150–400)
RBC: 4.44 MIL/uL (ref 3.87–5.11)
RDW: 16.9 % — ABNORMAL HIGH (ref 11.5–15.5)
WBC: 12.1 10*3/uL — ABNORMAL HIGH (ref 4.0–10.5)
nRBC: 0 % (ref 0.0–0.2)

## 2019-11-10 LAB — URINE CULTURE: Culture: NO GROWTH

## 2019-11-10 LAB — HIV ANTIBODY (ROUTINE TESTING W REFLEX): HIV Screen 4th Generation wRfx: NONREACTIVE

## 2019-11-10 LAB — HEMOGLOBIN A1C
Hgb A1c MFr Bld: 5.7 % — ABNORMAL HIGH (ref 4.8–5.6)
Mean Plasma Glucose: 116.89 mg/dL

## 2019-11-10 LAB — PHOSPHORUS: Phosphorus: 4.5 mg/dL (ref 2.5–4.6)

## 2019-11-10 MED ORDER — ACETAMINOPHEN 500 MG PO TABS
1000.0000 mg | ORAL_TABLET | Freq: Four times a day (QID) | ORAL | Status: DC | PRN
  Administered 2019-11-10 – 2019-11-19 (×9): 1000 mg via ORAL
  Filled 2019-11-10 (×11): qty 2

## 2019-11-10 MED ORDER — SODIUM CHLORIDE 0.9 % IV BOLUS
1000.0000 mL | Freq: Once | INTRAVENOUS | Status: AC
  Administered 2019-11-10: 1000 mL via INTRAVENOUS

## 2019-11-10 MED ORDER — LACTATED RINGERS IV BOLUS
1000.0000 mL | Freq: Once | INTRAVENOUS | Status: AC
  Administered 2019-11-10: 1000 mL via INTRAVENOUS

## 2019-11-10 MED ORDER — ACETAMINOPHEN 500 MG PO TABS
1000.0000 mg | ORAL_TABLET | Freq: Once | ORAL | Status: AC
  Administered 2019-11-10: 1000 mg via ORAL
  Filled 2019-11-10: qty 2

## 2019-11-10 MED ORDER — HYDROMORPHONE HCL 1 MG/ML IJ SOLN
1.0000 mg | Freq: Once | INTRAMUSCULAR | Status: AC
  Administered 2019-11-10: 1 mg via INTRAVENOUS
  Filled 2019-11-10: qty 1

## 2019-11-10 MED ORDER — CHLORHEXIDINE GLUCONATE CLOTH 2 % EX PADS
6.0000 | MEDICATED_PAD | Freq: Every day | CUTANEOUS | Status: DC
  Administered 2019-11-10 – 2019-11-18 (×9): 6 via TOPICAL

## 2019-11-10 MED ORDER — HYDROMORPHONE HCL 1 MG/ML IJ SOLN
0.5000 mg | INTRAMUSCULAR | Status: DC | PRN
  Administered 2019-11-10 – 2019-11-20 (×52): 1 mg via INTRAVENOUS
  Administered 2019-11-20: 0.5 mg via INTRAVENOUS
  Administered 2019-11-21 – 2019-11-23 (×22): 1 mg via INTRAVENOUS
  Filled 2019-11-10 (×77): qty 1

## 2019-11-10 NOTE — Progress Notes (Signed)
Mobility Specialist - Progress Note   11/10/19 1438  Mobility  Activity Refused mobility (Cancelled)  Mobility performed by Mobility specialist    Per chart review, pt is running abnormal vitals this date. Not appropriate for mobility at this time. Will attempt session at another date.    Filiberto Pinks Mobility Specialist 11/10/19, 2:39 PM

## 2019-11-10 NOTE — Progress Notes (Addendum)
   11/10/19 1801  Vitals  Temp 99.5 F (37.5 C)  BP (!) 95/54  MAP (mmHg) 68  BP Location Right Arm  BP Method Automatic  Pulse Rate (!) 131  Pulse Rate Source Monitor  Resp (!) 24  MEWS COLOR  MEWS Score Color Red  Oxygen Therapy  SpO2 93 %  O2 Device Room Air  MEWS Score  MEWS Temp 0  MEWS Systolic 1  MEWS Pulse 3  MEWS RR 1  MEWS LOC 0  MEWS Score 5  MD notified of above and telemetry report of SVT, new orders given. UOP 38ml last 4 hours,  LR Bolus initiated.  Labs to be drawn at 2100.  Will continue to monitor.

## 2019-11-10 NOTE — Progress Notes (Signed)
MEWS continue to fluctuate from yellow to red due to HR , RR, and temp.  Tylenol given for increasing temperature and pain. Dilaudid given for pain as needed.  Patient states pain better controlled except when moves. UOP for shift.  LR bolus infusing currently and continued IVF at 200. Family at bedside

## 2019-11-10 NOTE — Progress Notes (Signed)
Pt Mew score is 4. Temp, HR, and respirations elevated. Primary nurse paged and spoke to Dr. Lady Gary. Orders received for 1 gram Tylenol PO once; 1 Liter Bolus NS at 999 ml/hr; Dilaudid 1 mg once IV. Primary nurse to continue to monitor.

## 2019-11-10 NOTE — Progress Notes (Signed)
   11/10/19 1138  Assess: MEWS Score  Temp (!) 102.4 F (39.1 C)  BP 105/78  Pulse Rate (!) 122  Resp (!) 32  Level of Consciousness Alert  SpO2 99 %  O2 Device Room Air  Assess: MEWS Score  MEWS Temp 2  MEWS Systolic 0  MEWS Pulse 2  MEWS RR 2  MEWS LOC 0  MEWS Score 6  MEWS Score Color Red  Assess: if the MEWS score is Yellow or Red  Were vital signs taken at a resting state? Yes  Focused Assessment Change from prior assessment (see assessment flowsheet)  Early Detection of Sepsis Score *See Row Information* High  MEWS guidelines implemented *See Row Information* No, previously red, continue vital signs every 4 hours  Treat  Pain Scale 0-10  Pain Score 5  Pain Type Acute pain  Take Vital Signs  Increase Vital Sign Frequency  Red: Q 1hr X 4 then Q 4hr X 4, if remains red, continue Q 4hrs  Escalate  MEWS: Escalate Red: discuss with charge nurse/RN and provider, consider discussing with RRT  Notify: Charge Nurse/RN  Name of Charge Nurse/RN Notified Olivia  Date Charge Nurse/RN Notified 11/10/19  Time Charge Nurse/RN Notified 1140  Notify: Provider  Provider Name/Title Lincoln Brigham  Date Provider Notified 11/10/19  Time Provider Notified 1140  Notification Type Page  Notification Reason Change in status  Response Other (Comment) (coming to evaluate)

## 2019-11-10 NOTE — Progress Notes (Signed)
Pt Mew score still a 4. Primary nurse notified Dr. Lady Gary of pt vitals and output in foley. Orders received for LR Bolus 1000 ml, along with increase maintence fluids to 200 ml an hour. Primary nurse to continue to monitor.

## 2019-11-10 NOTE — Progress Notes (Signed)
   11/10/19 1530 11/10/19 1545  Vitals  Temp 98.9 F (37.2 C) 98.9 F (37.2 C)  Temp Source Oral  --   BP 102/65  --   MAP (mmHg) 78  --   BP Location Left Arm  --   BP Method Automatic  --   Patient Position (if appropriate) Lying  --   Pulse Rate (!) 131 (!) 126  Pulse Rate Source Monitor Monitor  Resp (!) 32 (!) 28  Level of Consciousness  Level of Consciousness Alert Alert  MEWS COLOR  MEWS Score Color Red Red  Oxygen Therapy  SpO2 95 %  --   O2 Device Room Air  --   Pain Assessment  Pain Scale 0-10  --   Pain Score 3  --   Pain Intervention(s) Refused;Repositioned  --   Complaints & Interventions  Patients response to intervention Decreased  --   MEWS Score  MEWS Temp 0 0  MEWS Systolic 0 0  MEWS Pulse 3 2  MEWS RR 2 2  MEWS LOC 0 0  MEWS Score 5 4  continued increased respirations and pulse.  MD aware and states continue to monitor. Patient resting in no distress and states feels better, pain better controlled per patient.

## 2019-11-10 NOTE — H&P (Addendum)
[facility] SURGICAL HISTORY & PHYSICAL  HISTORY OF PRESENT ILLNESS (HPI):  34 y.o. female presented to [facility] ED yesterday for abdominal pain. Patient reports she initially noticed some lower abdominal pain around 2-3 weeks ago however this was not severe and only intermittent in nature. In the last 2-3 days, her pain has become more severe and constant. Nothing seems to make this better and movement/palpation exacerbate her pain. She has been febrile here with T-Max 101.2 and nauseous. She denied any associated  chills, emesis, cough, congestion, SOB, CP, or bowel changes. She denied any history of similar presentations in the past. No previous colonoscopy. Previous abdominal surgeries include cholecystectomy for choledocholithiasis in 2014. Work up in the ED was concerning for leukocytosis to 13.5K, AKI with sCr - 1.75, lactic acidosis (3.1 --> 2.7), and CT Abdomen/Pelvis was concerning for sigmoid diverticulitis with foci of extraluminal air concerning for perforation.   General surgery is consulted by emergency medicine physician Dr Antoine Primas, MD for evaluation and management of diverticulitis.   PAST MEDICAL HISTORY (PMH):  Past Medical History:  Diagnosis Date   Hypertension     Reviewed. Otherwise negative.   PAST SURGICAL HISTORY (PSH):  Past Surgical History:  Procedure Laterality Date   CHOLECYSTECTOMY N/A 08/08/2012   Procedure: LAPAROSCOPIC CHOLECYSTECTOMY;  Surgeon: Dalia Heading, MD;  Location: [facility];  Service: General;  Laterality: N/A;   ERCP N/A 08/02/2012   Procedure: ENDOSCOPIC RETROGRADE CHOLANGIOPANCREATOGRAPHY (ERCP);  Surgeon: Malissa Hippo, MD;  Location: [facility];  Service: Gastroenterology;  Laterality: N/A;   PANCREATIC STENT PLACEMENT N/A 08/02/2012   Procedure: PANCREATIC STENT PLACEMENT;  Surgeon: Malissa Hippo, MD;  Location: [facility];  Service: Gastroenterology;  Laterality: N/A;   SPHINCTEROTOMY N/A 08/02/2012   Procedure: SPHINCTEROTOMY;   Surgeon: Malissa Hippo, MD;  Location: [facility];  Service: Gastroenterology;  Laterality: N/A;   STONE EXTRACTION WITH BASKET N/A 08/02/2012   Procedure: STONE EXTRACTION WITH BASKET;  Surgeon: Malissa Hippo, MD;  Location: [facility];  Service: Gastroenterology;  Laterality: N/A;    Reviewed. Otherwise negative.   MEDICATIONS:  Prior to Admission medications   Medication Sig Start Date End Date Taking? Authorizing Provider  losartan-hydrochlorothiazide (HYZAAR) 100-25 MG tablet Take 1 tablet by mouth daily. 03/26/19 03/25/20 Yes Willy Eddy, MD  azithromycin (ZITHROMAX Z-PAK) 250 MG tablet Take 2 tablets (500 mg) on  Day 1,  followed by 1 tablet (250 mg) once daily on Days 2 through 5. Patient not taking: Reported on 11/09/2019 12/06/17   Bridget Hartshorn L, PA-C  ondansetron (ZOFRAN) 4 MG tablet Take 1 tablet (4 mg total) by mouth daily as needed. Patient not taking: Reported on 11/09/2019 03/26/19 03/25/20  Willy Eddy, MD     ALLERGIES:  Allergies  Allergen Reactions   Penicillins Hives    Did it involve swelling of the face/tongue/throat, SOB, or low BP? No Did it involve sudden or severe rash/hives, skin peeling, or any reaction on the inside of your mouth or nose? Yes Did you need to seek medical attention at a hospital or doctor's office? No When did it last happen?       If all above answers are "NO", may proceed with cephalosporin use..3   Tomato Rash     SOCIAL HISTORY:  Social History   Socioeconomic History   Marital status: Single    Spouse name: Not on file   Number of children: Not on file   Years of education: Not on file  Highest education level: Not on file  Occupational History   Not on file  Tobacco Use   Smoking status: Never Smoker   Smokeless tobacco: Never Used  Substance and Sexual Activity   Alcohol use: No   Drug use: No   Sexual activity: Never    Birth control/protection: Abstinence  Other Topics Concern   Not on file  Social History  Narrative   Not on file   Social Determinants of Health   Financial Resource Strain:    Difficulty of Paying Living Expenses: Not on file  Food Insecurity:    Worried About Running Out of Food in the Last Year: Not on file   Ran Out of Food in the Last Year: Not on file  Transportation Needs:    Lack of Transportation (Medical): Not on file   Lack of Transportation (Non-Medical): Not on file  Physical Activity:    Days of Exercise per Week: Not on file   Minutes of Exercise per Session: Not on file  Stress:    Feeling of Stress : Not on file  Social Connections:    Frequency of Communication with Friends and Family: Not on file   Frequency of Social Gatherings with Friends and Family: Not on file   Attends Religious Services: Not on file   Active Member of Clubs or Organizations: Not on file   Attends Banker Meetings: Not on file   Marital Status: Not on file  Intimate Partner Violence:    Fear of Current or Ex-Partner: Not on file   Emotionally Abused: Not on file   Physically Abused: Not on file   Sexually Abused: Not on file     FAMILY HISTORY:  History reviewed. No pertinent family history.  Otherwise negative.   REVIEW OF SYSTEMS:  Review of Systems  Constitutional: Positive for fever. Negative for chills.  HENT: Negative for congestion and sore throat.   Respiratory: Negative for cough and shortness of breath.   Cardiovascular: Negative for chest pain and palpitations.  Gastrointestinal: Positive for abdominal pain and nausea. Negative for diarrhea and vomiting.  Genitourinary: Negative for dysuria and urgency.  Neurological: Negative for dizziness and headaches.  All other systems reviewed and are negative.   VITAL SIGNS:  Temp:  [98.6 F (37 C)-101.2 F (38.4 C)] 100 F (37.8 C) (08/31 0636) Pulse Rate:  [120-139] 129 (08/31 0636) Resp:  [20-33] 27 (08/31 0636) BP: (105-135)/(58-100) 113/62 (08/31 0636) SpO2:  [91 %-100 %] 93 % (08/31  0636) Weight:  [163.3 kg] 163.3 kg (08/30 1559)     Height: 5\' 9"  (175.3 cm) Weight: (!) 163.3 kg BMI (Calculated): 53.14   PHYSICAL EXAM:  Physical Exam Vitals and nursing note reviewed. Exam conducted with a chaperone present.  Constitutional:      General: She is not in acute distress.    Appearance: She is well-developed. She is obese. She is ill-appearing.     Comments: Appears very uncomfortable, no moving in bed, wincing with coughing  HENT:     Head: Normocephalic and atraumatic.  Eyes:     General: No scleral icterus.    Extraocular Movements: Extraocular movements intact.  Cardiovascular:     Rate and Rhythm: Regular rhythm. Tachycardia present.     Heart sounds: Normal heart sounds. No murmur heard.  No friction rub. No gallop.   Pulmonary:     Effort: Pulmonary effort is normal. No respiratory distress.     Breath sounds: Normal breath sounds.  Abdominal:  General: There is distension.     Palpations: Abdomen is soft.     Tenderness: There is generalized abdominal tenderness. There is guarding. There is no rebound.     Comments: Abdomen is morbidly obese, she appears tender diffusely but worse in lower abdomen, she does appear distended in upper abdomen but this is challenging given body habitus, guarded but no rebound  Genitourinary:    Comments: Deferred, foley in place Skin:    General: Skin is warm and dry.     Coloration: Skin is not jaundiced or pale.  Neurological:     General: No focal deficit present.     Mental Status: She is alert and oriented to person, place, and time.  Psychiatric:        Mood and Affect: Mood normal.        Behavior: Behavior normal.     INTAKE/OUTPUT:  This shift: No intake/output data recorded.  Last 2 shifts: @IOLAST2SHIFTS @  Labs:  CBC Latest Ref Rng & Units 11/09/2019 03/26/2019 05/29/2018  WBC 4.0 - 10.5 K/uL 13.5(H) 7.3 6.4  Hemoglobin 12.0 - 15.0 g/dL 05/31/2018 83.4 19.6  Hematocrit 36 - 46 % 39.0 40.8 39.6  Platelets  150 - 400 K/uL 489(H) 389 337   CMP Latest Ref Rng & Units 11/09/2019 03/25/2019 05/29/2018  Glucose 70 - 99 mg/dL 05/31/2018) 979(G) 921(J)  BUN 6 - 20 mg/dL 13 12 13   Creatinine 0.44 - 1.00 mg/dL 941(D) ) 4.08(X  Sodium 135 - 145 mmol/L 139 138 139  Potassium 3.5 - 5.1 mmol/L 3.5 4.1 3.7  Chloride 98 - 111 mmol/L 98 105 104  CO2 22 - 32 mmol/L 25 24 26   Calcium 8.9 - 10.3 mg/dL 4.48(J) 8.9 8.56)  Total Protein 6.5 - 8.1 g/dL ) - -  Total Bilirubin 0.3 - 1.2 mg/dL 1.0 - -  Alkaline Phos 38 - 126 U/L 105 - -  AST 15 - 41 U/L 21 - -  ALT 0 - 44 U/L 30 - -    Imaging studies:   CT Abdomen/Pelvis (11/09/2019) personally reviewed showing inflammatory changes surrounding the sigmoid colon with foci of extraluminal air in the mesentery concerning for diverticulitis with perforation, no gross abscess at this time, and radiologist report reviewed:  IMPRESSION: 1. Focal wall thickening and inflammatory change at the sigmoid colon with extraluminal gas collection and considerable focal inflammatory change, consistent with acute perforated diverticulitis. There is a small focus of free air in the umbilical region. 2. Small pelvic fluid collection with slight increased density values though without strong rim enhancement at this time to suggest organized pelvic abscess.    Assessment/Plan: (ICD-10's: K53.20) 34 y.o. female with leukocytosis, AKI, mild lactic acidosis, and abdominal pain concerning for complicated sigmoid diverticulitis with likely micro-perforation without abscess, complicated by pertinent comorbidities including morbid obesity.    - Admit to general surgery   - We will attempt to manage her condition conservatively if possible. She understands that if she were to clinically deteriorate or fail to improve then she would require more urgent intervention and temporizing colostomy. I am concerned regarding her abdominal examination this morning/    - NPO + IVF  resuscitation  - IV ABx (Cipro + Flagyl)  - Monitor abdominal examination; on-going bowel function  - She maybenefit from repeat imaging in ~72 hours, pending clinical condition, to reassess intra-abdominal process and identify any abscess that may form   - Pain control prn; antiemetics prn  - Monitor lactic acidosis, leukocytosis, renal function\  -  Continue foley for now for UP monitoring   - Home meds  - mobilize as tolerated  - DVT prophylaxis  All of the above findings and recommendations were discussed with the patient, and all of her questions were answered to her expressed satisfaction.  -- Lynden OxfordZachary Schulz, PA-C [facility] 11/10/2019, 7:18 AM [phone] M-F: 7am - 4pm  I saw and evaluated the patient.  At the time of my exam, lactic acidosis had resolved and pain was much better controlled.  I believe she was (and still is, to an extent), under-resuscitated. No emergent need for surgery at this time. Dr. Claudine Moutonodenberg will be following her starting Wednesday. I agree with the above documentation, exam, and plan, which I have edited where appropriate. Duanne GuessJennifer Bindu Docter  1:06 PM

## 2019-11-11 LAB — CBC
HCT: 33.1 % — ABNORMAL LOW (ref 36.0–46.0)
Hemoglobin: 10.8 g/dL — ABNORMAL LOW (ref 12.0–15.0)
MCH: 24.7 pg — ABNORMAL LOW (ref 26.0–34.0)
MCHC: 32.6 g/dL (ref 30.0–36.0)
MCV: 75.6 fL — ABNORMAL LOW (ref 80.0–100.0)
Platelets: 337 10*3/uL (ref 150–400)
RBC: 4.38 MIL/uL (ref 3.87–5.11)
RDW: 17.2 % — ABNORMAL HIGH (ref 11.5–15.5)
WBC: 16.2 10*3/uL — ABNORMAL HIGH (ref 4.0–10.5)
nRBC: 0 % (ref 0.0–0.2)

## 2019-11-11 LAB — BASIC METABOLIC PANEL
Anion gap: 10 (ref 5–15)
BUN: 26 mg/dL — ABNORMAL HIGH (ref 6–20)
CO2: 19 mmol/L — ABNORMAL LOW (ref 22–32)
Calcium: 7.1 mg/dL — ABNORMAL LOW (ref 8.9–10.3)
Chloride: 105 mmol/L (ref 98–111)
Creatinine, Ser: 1.81 mg/dL — ABNORMAL HIGH (ref 0.44–1.00)
GFR calc Af Amer: 42 mL/min — ABNORMAL LOW (ref >60)
GFR calc non Af Amer: 36 mL/min — ABNORMAL LOW (ref >60)
Glucose, Bld: 111 mg/dL — ABNORMAL HIGH (ref 70–99)
Potassium: 4 mmol/L (ref 3.5–5.1)
Sodium: 134 mmol/L — ABNORMAL LOW (ref 135–145)

## 2019-11-11 LAB — GLUCOSE, CAPILLARY
Glucose-Capillary: 94 mg/dL (ref 70–99)
Glucose-Capillary: 106 mg/dL — ABNORMAL HIGH (ref 70–99)
Glucose-Capillary: 92 mg/dL (ref 70–99)
Glucose-Capillary: 88 mg/dL (ref 70–99)
Glucose-Capillary: 80 mg/dL (ref 70–99)

## 2019-11-11 LAB — LACTIC ACID, PLASMA: Lactic Acid, Venous: 1.2 mmol/L (ref 0.5–1.9)

## 2019-11-11 LAB — MAGNESIUM: Magnesium: 1.4 mg/dL — ABNORMAL LOW (ref 1.7–2.4)

## 2019-11-11 LAB — PHOSPHORUS: Phosphorus: 2.8 mg/dL (ref 2.5–4.6)

## 2019-11-11 MED ORDER — IBUPROFEN 400 MG PO TABS
800.0000 mg | ORAL_TABLET | Freq: Three times a day (TID) | ORAL | Status: DC | PRN
  Administered 2019-11-11 – 2019-11-20 (×6): 800 mg via ORAL
  Filled 2019-11-11 (×6): qty 2

## 2019-11-11 MED ORDER — MAGNESIUM SULFATE 2 GM/50ML IV SOLN
2.0000 g | Freq: Once | INTRAVENOUS | Status: AC
  Administered 2019-11-11: 2 g via INTRAVENOUS
  Filled 2019-11-11: qty 50

## 2019-11-11 MED ORDER — SODIUM CHLORIDE 0.9 % IV SOLN
INTRAVENOUS | Status: DC | PRN
  Administered 2019-11-11 – 2019-11-18 (×2): 250 mL via INTRAVENOUS
  Administered 2019-11-25: 100 mL via INTRAVENOUS
  Administered 2019-11-27: 250 mL via INTRAVENOUS
  Administered 2019-12-01: 500 mL via INTRAVENOUS

## 2019-11-11 NOTE — Progress Notes (Signed)
Mobility Specialist - Progress Note   11/11/19 1516  Mobility  Activity Dangled on edge of bed (Bed exercises)  Level of Assistance Moderate assist, patient does 50-74% (max. assist sit to supine)  Assistive Device Other (Comment) (Bed rails)  Mobility Response Tolerated well  Mobility performed by Mobility specialist  $Mobility charge 1 Mobility    Pre-mobility: 120 HR, 95/67 BP, 94% SpO2 Post-mobility: 127 HR, 104/64 BP, 93% SpO2   Pt laying in bed w/ nurse and mom present in room upon arrival. Pt agreed to mobility. Pt performed bed exercises: ankle pumps x 5/leg, heel slides x 5/leg, slr x 5/leg, hip ab/ad x 5/leg. Pt needed mod.assist to help increase ROM. After talking to pt about how they were mobilizing prior to admission, pt motivated to sit up at EOB. Pt able to supine-sit EOB w/ mod.assist. Pt had c/o abdominal prior to session, rating it 5/10. Sitting EOB, pt states "the pain went down to 3/10". Pt requested to change into a different gown while sitting EOB. NT notified to bring in a new gown. Mobility specialist and NT assisted pt w/ changing into a different gown. Pt able to dangle EOB ~5 minutes w/o any complaints. However, pt needed max assist + 2 transitioning from sit to supine. Pt states abdominal pain limits her ability to mobilize back to supine. Overall, pt tolerated session well. Pt left laying in bed w/ call bell and phone placed in reach. Nurse was notified.     [place] Mobility Specialist  11/11/19, 3:30 PM

## 2019-11-11 NOTE — Progress Notes (Signed)
Patient's temp down to 101.4 after Ibuprofen and back to MEWS of 4. Administered next dose of Tylenol. Continue to monitor.

## 2019-11-11 NOTE — Progress Notes (Signed)
Spoke with Dr. Claudine Mouton re: patient's fever increased back to 103.1 after Tylenol administration. Received orders to administer Ibuprofen and for a lactic acid. Will Continue to monitor.

## 2019-11-11 NOTE — Progress Notes (Signed)
MEWS fluctuating from 4 to 6 due to HR and temp. Tylenol administered for fever and dilaudid for pain. IVF running at 200 ml/hr. BP, resps and O2 sat both WNL. Will continue to monitor and check VS q4h.

## 2019-11-11 NOTE — Progress Notes (Signed)
[facility] SURGICAL PROGRESS NOTE (cpt [phone])  Hospital Day(s): 2.   Interval History: Patient seen and examined. Overnight she continued to have intermittent fevers with a T-Max of 103.1 at 0400. This morning, patient reports she subjectively feels better but is still endorsing LLQ pain and feels like her abdomen is more firm and distended than baseline. She denied any chills, nausea, or emesis. Leukocytosis is slightly worse this morning to 16.2K. Renal function remains elevated despite aggressive IVF resuscitation with sCr - 1.81. UO - 635 in last 24 hours. She does have mild hypomagnesemia to 1.4, but otherwise no significant derangements in electrolytes. Lactic acid levels have remained in normal limits, most recently 1.2. She has remained NPO.   Review of Systems:  Constitutional: + fever, denied chills  HEENT: denies cough or congestion  Respiratory: denies any shortness of breath  Cardiovascular: denies chest pain or palpitations  Gastrointestinal: + abdominal pain, + subjective distension, denied N/V/D Genitourinary: denies burning with urination or urinary frequency Musculoskeletal: denies pain, decreased motor or sensation   Vital signs in last 24 hours: [min-max] current  Temp:  [98.9 F (37.2 C)-103.1 F (39.5 C)] 101.4 F (38.6 C) (09/01 0518) Pulse Rate:  [122-141] 138 (09/01 0417) Resp:  [20-32] 20 (09/01 0417) BP: (95-124)/(54-97) 111/72 (09/01 0417) SpO2:  [91 %-99 %] 92 % (09/01 0417)     Height: 5\' 9"  (175.3 cm) Weight: (!) 163.3 kg BMI (Calculated): 53.14   Intake/Output last 2 shifts:  08/31 0701 - 09/01 0700 In: 3924.1 [I.V.:2260.5; IV Piggyback:1663.6] Out: 635 [Urine:635]   Physical Exam:  Constitutional: alert, cooperative and no distress  HENT: normocephalic without obvious abnormality  Eyes: PERRL, EOM's grossly intact and symmetric  Respiratory: breathing non-labored at rest  Cardiovascular: Tachycardic and sinus rhythm   Gastrointestinal: Abdomen is obese which makes reliable examination challenging, she does appear most tender in LLQ, she appears distended but again this is difficult to assess, no rebound/guarding Genitourinary: Foley in place   Labs:  CBC Latest Ref Rng & Units 11/11/2019 11/10/2019 11/09/2019  WBC 4.0 - 10.5 K/uL 16.2(H) 12.1(H) 13.5(H)  Hemoglobin 12.0 - 15.0 g/dL 10.8(L) 10.8(L) 12.0  Hematocrit 36 - 46 % 33.1(L) 33.7(L) 39.0  Platelets 150 - 400 K/uL 337 389 489(H)   CMP Latest Ref Rng & Units 11/11/2019 11/10/2019 11/10/2019  Glucose 70 - 99 mg/dL 11/12/2019) 017(P) 102(H)  BUN 6 - 20 mg/dL 852(D) 78(E) 42(P)  Creatinine 0.44 - 1.00 mg/dL 53(I) 1.44(R) 1.54(M)  Sodium 135 - 145 mmol/L 134(L) 137 137  Potassium 3.5 - 5.1 mmol/L 4.0 4.6 3.8  Chloride 98 - 111 mmol/L 105 104 105  CO2 22 - 32 mmol/L 19(L) 18(L) 23  Calcium 8.9 - 10.3 mg/dL 7.1(L) 7.4(L) 7.6(L)  Total Protein 6.5 - 8.1 g/dL - - -  Total Bilirubin 0.3 - 1.2 mg/dL - - -  Alkaline Phos 38 - 126 U/L - - -  AST 15 - 41 U/L - - -  ALT 0 - 44 U/L - - -     Imaging studies: No new pertinent imaging studies   Assessment/Plan: (ICD-10's: K43.20) 34 y.o. female with continued intermittent fevers, worsening leukocytosis, and persistent AKI despite aggressive resuscitative efforts, admitted with complicated sigmoid diverticulitis with likely micro-perforation without abscess, complicated by pertinent comorbidities including morbid obesity   - It will remain in her best interest if we can try to manage this conservatively without operative intervention. She continues to have intermittent fevers and an increase in leukocytosis  which is concerning but her abdominal examination is improved some compared to my initial evaluation. I think we are just now starting to catch up with resuscitative measure. I do not think we are "out of the woods" yet, and we will continue to do serial examinations. She understands the potential need for  temporizing colostomy should we need to intervene.     - NPO + aggressive IVF resuscitation             - IV ABx (Cipro + Flagyl); could consider broadening to Meropenem should fevers not resolve.              - Monitor abdominal examination; on-going bowel function             - She will benefit from repeat imaging in ~72 hours, pending clinical condition, to reassess intra-abdominal process and identify any abscess that may form. This will be tomorrow at the earliest.   - Antipyretics prn  - Pain control prn; antiemetics prn             - Monitor lactic acidosis, leukocytosis, renal function\             - Continue foley for now for UP monitoring              - Home meds             - mobilize as tolerated             - DVT prophylaxis  All of the above findings and recommendations were discussed with the patient, and all of her questions were answered to her expressed satisfaction.   -- Lynden Oxford, PA-C [facility] 11/11/2019, 7:16 AM [phone] M-F: 7am - 4pm

## 2019-11-12 ENCOUNTER — Encounter: Payer: Self-pay | Admitting: General Surgery

## 2019-11-12 ENCOUNTER — Inpatient Hospital Stay: Payer: BC Managed Care – PPO

## 2019-11-12 LAB — GLUCOSE, CAPILLARY
Glucose-Capillary: 100 mg/dL — ABNORMAL HIGH (ref 70–99)
Glucose-Capillary: 107 mg/dL — ABNORMAL HIGH (ref 70–99)
Glucose-Capillary: 76 mg/dL (ref 70–99)
Glucose-Capillary: 80 mg/dL (ref 70–99)
Glucose-Capillary: 83 mg/dL (ref 70–99)
Glucose-Capillary: 84 mg/dL (ref 70–99)
Glucose-Capillary: 92 mg/dL (ref 70–99)

## 2019-11-12 LAB — MAGNESIUM: Magnesium: 1.9 mg/dL (ref 1.7–2.4)

## 2019-11-12 MED ORDER — IOHEXOL 300 MG/ML  SOLN
125.0000 mL | Freq: Once | INTRAMUSCULAR | Status: AC | PRN
  Administered 2019-11-12: 125 mL via INTRAVENOUS

## 2019-11-12 MED ORDER — OXYCODONE HCL 5 MG PO TABS
5.0000 mg | ORAL_TABLET | ORAL | Status: DC | PRN
  Administered 2019-11-12 – 2019-11-16 (×8): 10 mg via ORAL
  Administered 2019-11-20: 5 mg via ORAL
  Administered 2019-11-23 – 2019-12-06 (×12): 10 mg via ORAL
  Administered 2019-12-07: 5 mg via ORAL
  Administered 2019-12-07: 10 mg via ORAL
  Administered 2019-12-07: 5 mg via ORAL
  Administered 2019-12-08 – 2019-12-18 (×21): 10 mg via ORAL
  Filled 2019-11-12 (×6): qty 2
  Filled 2019-11-12: qty 1
  Filled 2019-11-12: qty 2
  Filled 2019-11-12: qty 1
  Filled 2019-11-12 (×35): qty 2
  Filled 2019-11-12: qty 1
  Filled 2019-11-12 (×3): qty 2

## 2019-11-12 MED ORDER — IOHEXOL 9 MG/ML PO SOLN
500.0000 mL | ORAL | Status: AC
  Administered 2019-11-12 (×2): 500 mL via ORAL

## 2019-11-12 NOTE — Progress Notes (Signed)
Mobility Specialist - Progress Note   11/12/19 1309  Mobility  Activity  (Bed Exercises)  Level of Assistance Standby assist, set-up cues, supervision of patient - no hands on  Assistive Device None  Mobility Response Tolerated well  Mobility performed by Mobility specialist  $Mobility charge 1 Mobility    Pre-mobility: 104 HR, 108/77 BP, 96% SpO2 Post-mobility: 108 HR, 116/80 BP, 100% SpO2   Pt was laying in bed upon arrival. Pt agreed to session. Pt able to complete bed exercises w/ SBA. Pt completed 10 reps per leg of each: ankle circles, ankle pumps, heel slides, slr, hip ad/ab, and glute sets. Pt tolerated session well. Pt encouraged to practice these exercises t/o their day. Pt left in bed w/ nurse, NT, and mom present in room.    Diannia Hogenson Mobility Specialist  11/12/19, 1:12 PM

## 2019-11-12 NOTE — Progress Notes (Signed)
[facility] SURGICAL PROGRESS NOTE (cpt [phone])  Hospital Day(s): 3.   Interval History: Patient seen and examined. This morning, patient reports she feels better but is still notes LLQ pain and feels like her abdomen is better, she reports an appetite, and would like to begin CLD. She denied any chills, nausea, or emesis. Leukocytosis last noted at 16.2K. Renal function improving with. UO - 850 in last 24 hours.   Review of Systems:  Constitutional: + fever, denied chills  HEENT: denies cough or congestion  Respiratory: denies any shortness of breath  Cardiovascular: denies chest pain or palpitations  Gastrointestinal: + abdominal pain, + subjective distension, denied N/V/D Genitourinary: denies burning with urination or urinary frequency Musculoskeletal: denies pain, decreased motor or sensation   Vital signs in last 24 hours: [min-max] current  Temp:  [98.8 F (37.1 C)-99.4 F (37.4 C)] 98.8 F (37.1 C) (09/02 0407) Pulse Rate:  [114-124] 114 (09/02 0407) Resp:  [20-21] 20 (09/02 0407) BP: (93-122)/(67-80) 111/80 (09/02 0407) SpO2:  [93 %-95 %] 93 % (09/02 0407)     Height: 5\' 9"  (175.3 cm) Weight: (!) 163.3 kg BMI (Calculated): 53.14   Intake/Output last 2 shifts:  09/01 0701 - 09/02 0700 In: 1913.8 [I.V.:1563.8; IV Piggyback:350] Out: 850 [Urine:850]   Physical Exam:  Constitutional: alert, cooperative and no distress  HENT: normocephalic without obvious abnormality  Eyes: PERRL, EOM's grossly intact and symmetric  Respiratory: breathing non-labored at rest  Cardiovascular: Tachycardic and sinus rhythm  Gastrointestinal: Abdomen is obese which makes reliable examination challenging, she does appear most tender in LLQ, she feels less firm and distended, no rebound/guarding Genitourinary: Foley in place   Labs:  CBC Latest Ref Rng & Units 11/11/2019 11/10/2019 11/09/2019  WBC 4.0 - 10.5 K/uL 16.2(H) 12.1(H) 13.5(H)  Hemoglobin 12.0 - 15.0 g/dL 10.8(L)  10.8(L) 12.0  Hematocrit 36 - 46 % 33.1(L) 33.7(L) 39.0  Platelets 150 - 400 K/uL 337 389 489(H)   CMP Latest Ref Rng & Units 11/11/2019 11/10/2019 11/10/2019  Glucose 70 - 99 mg/dL 11/12/2019) 809(X) 833(A)  BUN 6 - 20 mg/dL 250(N) 39(J) 67(H)  Creatinine 0.44 - 1.00 mg/dL 41(P) 3.79(K) 2.40(X)  Sodium 135 - 145 mmol/L 134(L) 137 137  Potassium 3.5 - 5.1 mmol/L 4.0 4.6 3.8  Chloride 98 - 111 mmol/L 105 104 105  CO2 22 - 32 mmol/L 19(L) 18(L) 23  Calcium 8.9 - 10.3 mg/dL 7.1(L) 7.4(L) 7.6(L)  Total Protein 6.5 - 8.1 g/dL - - -  Total Bilirubin 0.3 - 1.2 mg/dL - - -  Alkaline Phos 38 - 126 U/L - - -  AST 15 - 41 U/L - - -  ALT 0 - 44 U/L - - -     Imaging studies: No new pertinent imaging studies   Assessment/Plan: (ICD-10's: K39.20) 34 y.o. female with intermittent fevers, leukocytosis, and persistent AKI despite aggressive resuscitative efforts, admitted with complicated sigmoid diverticulitis with likely micro-perforation without abscess, complicated by pertinent comorbidities including morbid obesity   - It will remain in her best interest if we can try to manage this conservatively without operative intervention.  I do not think we are "out of the woods" yet, and we will continue to do serial examinations. She understands the potential need for temporizing colostomy should we need to intervene.     - Will initiate CLD, and see how she progresses.              - IV ABx (Cipro + Flagyl); could consider  broadening to Meropenem should fevers not resolve.              - Monitor abdominal examination; on-going bowel function        - CT imaging today.   - Antipyretics prn  - Pain control prn; antiemetics prn             - Monitor leukocytosis, renal function\             - Continue foley for now for monitoring              - Home meds             - mobilize as tolerated             - DVT prophylaxis  All of the above findings and recommendations were discussed with the patient, and  all of her questions were answered to her expressed satisfaction.   Campbell Lerner, MD [facility] 11/12/2019, 1:16 PM

## 2019-11-13 ENCOUNTER — Inpatient Hospital Stay: Payer: BC Managed Care – PPO

## 2019-11-13 ENCOUNTER — Encounter: Payer: Self-pay | Admitting: General Surgery

## 2019-11-13 LAB — CBC
HCT: 27.9 % — ABNORMAL LOW (ref 36.0–46.0)
Hemoglobin: 8.8 g/dL — ABNORMAL LOW (ref 12.0–15.0)
MCH: 24.2 pg — ABNORMAL LOW (ref 26.0–34.0)
MCHC: 31.5 g/dL (ref 30.0–36.0)
MCV: 76.6 fL — ABNORMAL LOW (ref 80.0–100.0)
Platelets: 328 10*3/uL (ref 150–400)
RBC: 3.64 MIL/uL — ABNORMAL LOW (ref 3.87–5.11)
RDW: 17.6 % — ABNORMAL HIGH (ref 11.5–15.5)
WBC: 16.1 10*3/uL — ABNORMAL HIGH (ref 4.0–10.5)
nRBC: 0.4 % — ABNORMAL HIGH (ref 0.0–0.2)

## 2019-11-13 LAB — BASIC METABOLIC PANEL
Anion gap: 8 (ref 5–15)
BUN: 17 mg/dL (ref 6–20)
CO2: 21 mmol/L — ABNORMAL LOW (ref 22–32)
Calcium: 7.2 mg/dL — ABNORMAL LOW (ref 8.9–10.3)
Chloride: 107 mmol/L (ref 98–111)
Creatinine, Ser: 0.99 mg/dL (ref 0.44–1.00)
GFR calc Af Amer: >60 mL/min (ref >60)
GFR calc non Af Amer: >60 mL/min (ref >60)
Glucose, Bld: 107 mg/dL — ABNORMAL HIGH (ref 70–99)
Potassium: 4 mmol/L (ref 3.5–5.1)
Sodium: 136 mmol/L (ref 135–145)

## 2019-11-13 LAB — GLUCOSE, CAPILLARY
Glucose-Capillary: 97 mg/dL (ref 70–99)
Glucose-Capillary: 86 mg/dL (ref 70–99)
Glucose-Capillary: 88 mg/dL (ref 70–99)
Glucose-Capillary: 73 mg/dL (ref 70–99)
Glucose-Capillary: 104 mg/dL — ABNORMAL HIGH (ref 70–99)
Glucose-Capillary: 98 mg/dL (ref 70–99)

## 2019-11-13 MED ORDER — FENTANYL CITRATE (PF) 100 MCG/2ML IJ SOLN
INTRAMUSCULAR | Status: AC | PRN
  Administered 2019-11-13 (×2): 50 ug via INTRAVENOUS

## 2019-11-13 MED ORDER — MAGNESIUM HYDROXIDE 400 MG/5ML PO SUSP
30.0000 mL | Freq: Every day | ORAL | Status: DC
  Administered 2019-11-14 – 2019-11-24 (×5): 30 mL via ORAL
  Filled 2019-11-13 (×7): qty 30

## 2019-11-13 MED ORDER — MIDAZOLAM HCL 2 MG/2ML IJ SOLN
INTRAMUSCULAR | Status: AC | PRN
  Administered 2019-11-13 (×2): 1 mg via INTRAVENOUS

## 2019-11-13 MED ORDER — SODIUM CHLORIDE 0.9% FLUSH
5.0000 mL | Freq: Three times a day (TID) | INTRAVENOUS | Status: DC
  Administered 2019-11-14 – 2019-12-03 (×51): 5 mL

## 2019-11-13 MED ORDER — PROMETHAZINE HCL 25 MG/ML IJ SOLN
12.5000 mg | Freq: Four times a day (QID) | INTRAMUSCULAR | Status: DC | PRN
  Administered 2019-11-15 – 2019-11-22 (×4): 12.5 mg via INTRAVENOUS
  Filled 2019-11-13 (×4): qty 1

## 2019-11-13 MED ORDER — POLYETHYLENE GLYCOL 3350 17 G PO PACK
17.0000 g | PACK | Freq: Every day | ORAL | Status: DC
  Administered 2019-11-14 – 2019-11-24 (×5): 17 g via ORAL
  Filled 2019-11-13 (×8): qty 1

## 2019-11-13 MED ORDER — SODIUM CHLORIDE 0.9% FLUSH: 10.0000 mL | INTRAVENOUS | Status: DC | PRN

## 2019-11-13 MED ORDER — FENTANYL CITRATE (PF) 100 MCG/2ML IJ SOLN
INTRAMUSCULAR | Status: AC
  Filled 2019-11-13: qty 4

## 2019-11-13 MED ORDER — MIDAZOLAM HCL 2 MG/2ML IJ SOLN
INTRAMUSCULAR | Status: AC
  Filled 2019-11-13: qty 4

## 2019-11-13 NOTE — Progress Notes (Addendum)
Mobility Specialist - Progress Note   11/13/19 1136  Mobility  Activity  (bed exercises)  Level of Assistance Modified independent, requires aide device or extra time  Mobility Response Tolerated well  Mobility performed by Mobility specialist  $Mobility charge 1 Mobility    Pre-mobility: 100 HR, 118/67 BP, 99% SpO2 Post-mobility: 102 HR, 114/80 BP, 99% SpO2   Pt laying in bed w/ mom present in room upon arrival. Pt agreed to session. Pt performed 20 reps of each bed exercise mod. Independent: ankle pumps, ankle circles, slr, heel slides, hip ad, and glute sets. Pt tolerated session well. Pt remained in bed w/ mom present in room. Call bell and phone placed in reach.    Kendan Cornforth Mobility Specialist  11/13/19, 11:39 AM

## 2019-11-13 NOTE — Progress Notes (Signed)
[facility] SURGICAL PROGRESS NOTE (cpt [phone])  Hospital Day(s): 4.   Interval History: Patient seen and examined. This morning, patient reports she feels about the same still notes LLQ pain, she reports an nausea/vomiting. She spiked fever 101.8. Leukocytosis last noted at 16.1K. Renal function improving with. UO - 1150 in last 24 hours.  CT scan reviewed showing abscesses.  Advised the patient regarding the necessity of getting this infection out via drain.  Review of Systems:  Constitutional: + fever, denied chills  HEENT: denies cough or congestion  Respiratory: denies any shortness of breath  Cardiovascular: denies chest pain or palpitations  Gastrointestinal: + abdominal pain, + subjective distension, denied N/V/D Genitourinary: denies burning with urination or urinary frequency Musculoskeletal: denies pain, decreased motor or sensation   Vital signs in last 24 hours: [min-max] current  Temp:  [98.8 F (37.1 C)-101.8 F (38.8 C)] 99.1 F (37.3 C) (09/03 1144) Pulse Rate:  [99-124] 101 (09/03 1620) Resp:  [20-24] 20 (09/03 1620) BP: (115-143)/(77-95) 131/88 (09/03 1620) SpO2:  [99 %] 99 % (09/03 1144)     Height: 5\' 9"  (175.3 cm) Weight: (!) 163.3 kg BMI (Calculated): 53.14   Intake/Output last 2 shifts:  09/02 0701 - 09/03 0700 In: 0  Out: 1150 [Urine:1150]   Physical Exam:  Constitutional: alert, cooperative and no distress  HENT: normocephalic without obvious abnormality  Eyes: PERRL, EOM's grossly intact and symmetric  Respiratory: breathing non-labored at rest  Cardiovascular: Tachycardic and sinus rhythm  Gastrointestinal: Abdomen is obese which makes reliable examination challenging, she does appear most tender in LLQ, she feels less firm and distended, no rebound/guarding Genitourinary: Foley in place   Labs:  CBC Latest Ref Rng & Units 11/13/2019 11/11/2019 11/10/2019  WBC 4.0 - 10.5 K/uL 16.1(H) 16.2(H) 12.1(H)  Hemoglobin 12.0 - 15.0 g/dL  11/12/2019) 10.8(L) 10.8(L)  Hematocrit 36 - 46 % 27.9(L) 33.1(L) 33.7(L)  Platelets 150 - 400 K/uL 328 337 389   CMP Latest Ref Rng & Units 11/13/2019 11/11/2019 11/10/2019  Glucose 70 - 99 mg/dL 11/12/2019) 938(H) 829(H)  BUN 6 - 20 mg/dL 17 371(I) 96(V)  Creatinine 0.44 - 1.00 mg/dL 89(F 8.10) 1.75(Z)  Sodium 135 - 145 mmol/L 136 134(L) 137  Potassium 3.5 - 5.1 mmol/L 4.0 4.0 4.6  Chloride 98 - 111 mmol/L 107 105 104  CO2 22 - 32 mmol/L 21(L) 19(L) 18(L)  Calcium 8.9 - 10.3 mg/dL 7.2(L) 7.1(L) 7.4(L)  Total Protein 6.5 - 8.1 g/dL - - -  Total Bilirubin 0.3 - 1.2 mg/dL - - -  Alkaline Phos 38 - 126 U/L - - -  AST 15 - 41 U/L - - -  ALT 0 - 44 U/L - - -     Imaging studies:  CLINICAL DATA:  Left lower quadrant abdominal pain, perforated diverticulitis  EXAM: CT ABDOMEN AND PELVIS WITH CONTRAST  TECHNIQUE: Multidetector CT imaging of the abdomen and pelvis was performed using the standard protocol following bolus administration of intravenous contrast.  CONTRAST:  0.25(E OMNIPAQUE IOHEXOL 300 MG/ML  SOLN  COMPARISON:  11/09/2019  FINDINGS: Lower chest: Small right pleural effusion has developed with minimal right basilar compressive atelectasis. Left lung bases clear. Visualized heart and pericardium are unremarkable.  Hepatobiliary: Cholecystectomy has been performed. Mild pneumobilia is present in keeping with probable prior sphincterotomy. Mild hepatomegaly is stable. No focal intrahepatic lesion identified.  Pancreas: Unremarkable  Spleen: Unremarkable  Adrenals/Urinary Tract: Stable probable cortical cyst within the right upper pole and punctate 2 mm nonobstructing  calculus within the right lower pole. The kidneys and adrenal glands are otherwise unremarkable. Foley catheter balloon is seen within a decompressed bladder lumen.  Stomach/Bowel: There is progressive distension of multiple proximal loops of small bowel with gradual transition to more normal  caliber small bowel within the area of inflammation within the pelvis suggesting changes of a a partial small bowel obstruction and/or developing ileus secondary to the inflammatory pelvic process. The mid and distal small bowel as well as the colon is decompressed. There is increasing mesenteric infiltration within the pelvis, peritoneal enhancement within the pelvis, and shotty mesenteric and retroperitoneal adenopathy all in keeping with progressive inflammatory change. A loculated pericolonic gas and fluid collection has developed within the cul-de-sac in keeping with a focal abscess measuring 6.8 x 8.7 cm at axial image # 83/2. There is increasing, more amorphous extraluminal gas and fluid within the left hemipelvis and anterior peritoneum within the hypogastric region best seen at axial image # 66/2 and 76/2, however, these do not demonstrate a well-defined brim at this time. A similar collection is noted within the deep pelvis within the leaves of the mesentery measuring roughly 3.5 cm at axial image # 72/2. The appendix is unremarkable.  Vascular/Lymphatic: No frankly pathologic adenopathy within the abdomen and pelvis. The abdominal vasculature is unremarkable.  Reproductive: Uterus and bilateral adnexa are unremarkable.  Other: Increasing subcutaneous edema is noted within pannus and flanks bilaterally.  Musculoskeletal: No acute or significant osseous findings.  IMPRESSION: 1. Interval development of a 6.8 x 8.7 cm loculated pericolonic gas and fluid collection within the cul-de-sac in keeping with a focal abscess. There is increasing, more amorphous extraluminal gas and fluid within the left hemipelvis, deep pelvis and anterior peritoneum within the hypogastric region, however, these do not demonstrate a well-defined brim at this time. 2. Increasing distension of multiple proximal loops of small bowel with gradual transition to more normal caliber small bowel  within the area of inflammation within the pelvis suggesting changes of a partial small bowel obstruction and/or developing ileus secondary to the inflammatory pelvic process. 3. Progressive inflammatory change within the pelvis. 4. Increasing subcutaneous edema within pannus and flanks bilaterally in keeping with developing anasarca   Electronically Signed   By: Helyn Numbers MD   On: 11/12/2019 23:02  Assessment/Plan: (ICD-10's: K13.20) 34 y.o. female with intermittent fevers, leukocytosis, and persistent AKI despite aggressive resuscitative efforts, admitted with complicated sigmoid diverticulitis with likely micro-perforation without abscess, complicated by pertinent comorbidities including morbid obesity   -Discussed with Dr. Fredia Sorrow are desired to have this abscess drained today, and reported it may be challenging secondary to the distance a needle would have to go.  Anticipating attempt today.  - It will remain in her best interest if we can try to manage this conservatively without operative intervention.  I do not think we are "out of the woods" yet, and we will continue to do serial examinations. She understands the potential need for temporizing colostomy should we need to intervene.              - IV ABx (Cipro + Flagyl); could switch to Meropenem pending potential abscess drainage, and ongoing fevers..              - Monitor abdominal examination; on-going bowel function        - CT IR drainage today.   - Antipyretics prn  - Pain control prn; antiemetics prn             -  Monitor leukocytosis, renal function             - Continue foley for now for monitoring, and for poor mobilization.             - Home meds             - mobilize as tolerated             - DVT prophylaxis, held for percutaneous abscess drainage today.  All of the above findings and recommendations were discussed with the patient, and all of her questions were answered to her expressed satisfaction.    Campbell Lerner, MD [facility] 11/13/2019, 5:13 PM

## 2019-11-13 NOTE — Consult Note (Signed)
Chief Complaint: Patient was seen in consultation today for intra-abdominal fluid collection  Referring Physician(s): Dr. Toula Moos  Supervising Physician: Irish Lack  Patient Status: [facility] - In-pt  History of Present Illness: Amanda Reyes is a 34 y.o. female with past medical history of ERCP s/p pancreatic stent placement cholecystectomy admitted with fevers, abdominal pain.  Patient found to have complicated sigmoid diverticulitis with possible early abscess formation.  She was placed on antibiotics for conservative management, however has had persistent fevers with leukocytosis, WBC now 16.1.  Repeat CT Abdomen Pelvis 9/2 showed: 1. Interval development of a 6.8 x 8.7 cm loculated pericolonic gas and fluid collection within the cul-de-sac in keeping with a focal abscess. There is increasing, more amorphous extraluminal gas and fluid within the left hemipelvis, deep pelvis and anterior peritoneum within the hypogastric region, however, these do not demonstrate a well-defined brim at this time. 2. Increasing distension of multiple proximal loops of small bowel with gradual transition to more normal caliber small bowel within the area of inflammation within the pelvis suggesting changes of a partial small bowel obstruction and/or developing ileus secondary to the inflammatory pelvic process. 3. Progressive inflammatory change within the pelvis. 4. Increasing subcutaneous edema within pannus and flanks bilaterally in keeping with developing anasarca  IR consulted for aspiration and drainage of intra-abdominal fluid collection.  Case reviewed and approved by Dr. Fredia Sorrow.   Patient assessed at bedside.  Surgeon, Dr. Toula Moos at bedside.  Amanda Reyes complains of LLQ/midline pain today. Febrile overnight.  She is agreeable to proceed.  Past Medical History:  Diagnosis Date  . Hypertension     Past Surgical History:  Procedure Laterality Date  . CHOLECYSTECTOMY N/A  08/08/2012   Procedure: LAPAROSCOPIC CHOLECYSTECTOMY;  Surgeon: Dalia Heading, MD;  Location: [facility];  Service: General;  Laterality: N/A;  . ERCP N/A 08/02/2012   Procedure: ENDOSCOPIC RETROGRADE CHOLANGIOPANCREATOGRAPHY (ERCP);  Surgeon: Malissa Hippo, MD;  Location: [facility];  Service: Gastroenterology;  Laterality: N/A;  . PANCREATIC STENT PLACEMENT N/A 08/02/2012   Procedure: PANCREATIC STENT PLACEMENT;  Surgeon: Malissa Hippo, MD;  Location: [facility];  Service: Gastroenterology;  Laterality: N/A;  . SPHINCTEROTOMY N/A 08/02/2012   Procedure: SPHINCTEROTOMY;  Surgeon: Malissa Hippo, MD;  Location: [facility];  Service: Gastroenterology;  Laterality: N/A;  . STONE EXTRACTION WITH BASKET N/A 08/02/2012   Procedure: STONE EXTRACTION WITH BASKET;  Surgeon: Malissa Hippo, MD;  Location: [facility];  Service: Gastroenterology;  Laterality: N/A;    Allergies: Penicillins and Tomato  Medications: Prior to Admission medications   Medication Sig Start Date End Date Taking? Authorizing Provider  losartan-hydrochlorothiazide (HYZAAR) 100-25 MG tablet Take 1 tablet by mouth daily. 03/26/19 03/25/20 Yes Willy Eddy, MD  azithromycin (ZITHROMAX Z-PAK) 250 MG tablet Take 2 tablets (500 mg) on  Day 1,  followed by 1 tablet (250 mg) once daily on Days 2 through 5. Patient not taking: Reported on 11/09/2019 12/06/17   Bridget Hartshorn L, PA-C  ondansetron (ZOFRAN) 4 MG tablet Take 1 tablet (4 mg total) by mouth daily as needed. Patient not taking: Reported on 11/09/2019 03/26/19 03/25/20  Willy Eddy, MD     History reviewed. No pertinent family history.  Social History   Socioeconomic History  . Marital status: Single    Spouse name: Not on file  . Number of children: Not on file  . Years of education: Not on file  . Highest education level: Not on file  Occupational History  .  Not on file  Tobacco Use  . Smoking status: Never Smoker  . Smokeless tobacco: Never Used  Substance and Sexual  Activity  . Alcohol use: No  . Drug use: No  . Sexual activity: Never    Birth control/protection: Abstinence  Other Topics Concern  . Not on file  Social History Narrative  . Not on file   Social Determinants of Health   Financial Resource Strain:   . Difficulty of Paying Living Expenses: Not on file  Food Insecurity:   . Worried About Programme researcher, broadcasting/film/video in the Last Year: Not on file  . Ran Out of Food in the Last Year: Not on file  Transportation Needs:   . Lack of Transportation (Medical): Not on file  . Lack of Transportation (Non-Medical): Not on file  Physical Activity:   . Days of Exercise per Week: Not on file  . Minutes of Exercise per Session: Not on file  Stress:   . Feeling of Stress : Not on file  Social Connections:   . Frequency of Communication with Friends and Family: Not on file  . Frequency of Social Gatherings with Friends and Family: Not on file  . Attends Religious Services: Not on file  . Active Member of Clubs or Organizations: Not on file  . Attends Banker Meetings: Not on file  . Marital Status: Not on file     Review of Systems: A 12 point ROS discussed and pertinent positives are indicated in the HPI above.  All other systems are negative.  Review of Systems  Constitutional: Negative for fatigue and fever.  Respiratory: Negative for cough and shortness of breath.   Cardiovascular: Negative for chest pain.  Gastrointestinal: Positive for abdominal pain, constipation and nausea.  Genitourinary: Negative for dysuria.  Musculoskeletal: Negative for back pain.  Psychiatric/Behavioral: Negative for behavioral problems and confusion.    Vital Signs: BP 115/77   Pulse 99   Temp 99.1 F (37.3 C)   Resp 20   Ht  (1.753 m)   Wt (!) 360 lb (163.3 kg)   LMP  (LMP Unknown)   SpO2 99%   BMI 53.16 kg/m   Physical Exam Vitals and nursing note reviewed.  Constitutional:      General: She is not in acute distress.     Appearance: She is well-developed. She is not ill-appearing.  HENT:     Mouth/Throat:     Mouth: Mucous membranes are moist.     Pharynx: Oropharynx is clear.  Cardiovascular:     Rate and Rhythm: Normal rate and regular rhythm.  Pulmonary:     Effort: Pulmonary effort is normal. No respiratory distress.     Breath sounds: Normal breath sounds.  Abdominal:     Tenderness: There is generalized abdominal tenderness and tenderness in the epigastric area.  Skin:    General: Skin is warm and dry.  Neurological:     General: No focal deficit present.     Mental Status: She is alert and oriented to person, place, and time.  Psychiatric:        Mood and Affect: Mood normal.        Behavior: Behavior normal.      MD Evaluation Airway: WNL Heart: WNL Abdomen: WNL Chest/ Lungs: WNL ASA  Classification: 3 Mallampati/Airway Score: Two   Imaging: DG Chest 1 View  Result Date: 11/09/2019 CLINICAL DATA:  LEFT-sided pain. EXAM: CHEST  1 VIEW COMPARISON:  03/25/2019 chest radiograph FINDINGS:  The cardiomediastinal silhouette is unremarkable. Mild peribronchial thickening again noted. There is no evidence of focal airspace disease, pulmonary edema, suspicious pulmonary nodule/mass, pleural effusion, or pneumothorax. No acute bony abnormalities are identified. IMPRESSION: No active disease. Electronically Signed   By: Harmon Pier M.D.   On: 11/09/2019 18:18   DG Abd 1 View  Result Date: 11/10/2019 CLINICAL DATA:  Left colonic diverticulitis with evidence for perforation on CT scan yesterday. EXAM: ABDOMEN - 1 VIEW COMPARISON:  CT scan from yesterday. FINDINGS: Right-sided cubitus views of the abdomen are limited by patient body habitus. No gross intraperitoneal free air evident. No gaseous bowel dilatation to suggest obstruction. IMPRESSION: Limited study without evidence of gross intraperitoneal free air under the non dependent abdominal wall. Patient was noted to have multiple sites of  extraluminal gas in the pelvis on yesterday's CT. Electronically Signed   By: Kennith Center M.D.   On: 11/10/2019 10:52   CT ABDOMEN PELVIS W CONTRAST  Result Date: 11/12/2019 CLINICAL DATA:  Left lower quadrant abdominal pain, perforated diverticulitis EXAM: CT ABDOMEN AND PELVIS WITH CONTRAST TECHNIQUE: Multidetector CT imaging of the abdomen and pelvis was performed using the standard protocol following bolus administration of intravenous contrast. CONTRAST:  OMNIPAQUE IOHEXOL 300 MG/ML  SOLN COMPARISON:  11/09/2019 FINDINGS: Lower chest: Small right pleural effusion has developed with minimal right basilar compressive atelectasis. Left lung bases clear. Visualized heart and pericardium are unremarkable. Hepatobiliary: Cholecystectomy has been performed. Mild pneumobilia is present in keeping with probable prior sphincterotomy. Mild hepatomegaly is stable. No focal intrahepatic lesion identified. Pancreas: Unremarkable Spleen: Unremarkable Adrenals/Urinary Tract: Stable probable cortical cyst within the right upper pole and punctate 2 mm nonobstructing calculus within the right lower pole. The kidneys and adrenal glands are otherwise unremarkable. Foley catheter balloon is seen within a decompressed bladder lumen. Stomach/Bowel: There is progressive distension of multiple proximal loops of small bowel with gradual transition to more normal caliber small bowel within the area of inflammation within the pelvis suggesting changes of a a partial small bowel obstruction and/or developing ileus secondary to the inflammatory pelvic process. The mid and distal small bowel as well as the colon is decompressed. There is increasing mesenteric infiltration within the pelvis, peritoneal enhancement within the pelvis, and shotty mesenteric and retroperitoneal adenopathy all in keeping with progressive inflammatory change. A loculated pericolonic gas and fluid collection has developed within the cul-de-sac in keeping  with a focal abscess measuring 6.8 x 8.7 cm at axial image # 83/2. There is increasing, more amorphous extraluminal gas and fluid within the left hemipelvis and anterior peritoneum within the hypogastric region best seen at axial image # 66/2 and 76/2, however, these do not demonstrate a well-defined brim at this time. A similar collection is noted within the deep pelvis within the leaves of the mesentery measuring roughly 3.5 cm at axial image # 72/2. The appendix is unremarkable. Vascular/Lymphatic: No frankly pathologic adenopathy within the abdomen and pelvis. The abdominal vasculature is unremarkable. Reproductive: Uterus and bilateral adnexa are unremarkable. Other: Increasing subcutaneous edema is noted within pannus and flanks bilaterally. Musculoskeletal: No acute or significant osseous findings. IMPRESSION: 1. Interval development of a 6.8 x 8.7 cm loculated pericolonic gas and fluid collection within the cul-de-sac in keeping with a focal abscess. There is increasing, more amorphous extraluminal gas and fluid within the left hemipelvis, deep pelvis and anterior peritoneum within the hypogastric region, however, these do not demonstrate a well-defined brim at this time. 2. Increasing distension of multiple proximal  loops of small bowel with gradual transition to more normal caliber small bowel within the area of inflammation within the pelvis suggesting changes of a partial small bowel obstruction and/or developing ileus secondary to the inflammatory pelvic process. 3. Progressive inflammatory change within the pelvis. 4. Increasing subcutaneous edema within pannus and flanks bilaterally in keeping with developing anasarca Electronically Signed   By: Helyn NumbersAshesh  Parikh MD   On: 11/12/2019 23:02   CT ABDOMEN PELVIS W CONTRAST  Result Date: 11/09/2019 CLINICAL DATA:  Abdominal pain EXAM: CT ABDOMEN AND PELVIS WITH CONTRAST TECHNIQUE: Multidetector CT imaging of the abdomen and pelvis was performed using the  standard protocol following bolus administration of intravenous contrast. CONTRAST:  100mL OMNIPAQUE IOHEXOL 300 MG/ML  SOLN COMPARISON:  None. FINDINGS: Lower chest: Lung bases demonstrate no acute consolidation or effusion. Normal cardiac size. Hepatobiliary: No focal liver abnormality is seen. Status post cholecystectomy. No biliary dilatation. Pancreas: Unremarkable. No pancreatic ductal dilatation or surrounding inflammatory changes. Spleen: Normal in size without focal abnormality. Adrenals/Urinary Tract: Adrenal glands are normal. Kidneys show no hydronephrosis. Small cyst in the upper pole of the right kidney. Foley catheter in the bladder which is decompressed. Stomach/Bowel: The stomach is nonenlarged. No dilated small bowel. Negative appendix. Prominent submucosal fat deposition at the colon consistent with chronic inflammatory process. Left colon diverticular disease. Focal wall thickening and inflammatory change at the sigmoid colon. More focal inflammatory process at the sigmoid colon with soft tissue stranding and small foci of gas, consistent with perforation. No well organized abscess at this time. Vascular/Lymphatic: Nonaneurysmal aorta.  No suspicious adenopathy. Reproductive: Uterus and bilateral adnexa are unremarkable. Other: Multiple foci of free air within the abdomen, for example series 2, image number 33, series 2, image number 37, series 2, image number 39/40, series 2, image number 50, and small focus of free air near the umbilicus. Small amount of mildly complex fluid in the pelvis without strong rim enhancement to suggest organized abscess at this time. Musculoskeletal: No acute or significant osseous findings. IMPRESSION: 1. Focal wall thickening and inflammatory change at the sigmoid colon with extraluminal gas collection and considerable focal inflammatory change, consistent with acute perforated diverticulitis. There is a small focus of free air in the umbilical region. 2. Small  pelvic fluid collection with slight increased density values though without strong rim enhancement at this time to suggest organized pelvic abscess. Critical Value/emergent results were called by telephone at the time of interpretation on 11/09/2019 at 6:02 pm to provider [facility] SMITH , who verbally acknowledged these results. Electronically Signed   By: Jasmine PangKim  Fujinaga M.D.   On: 11/09/2019 18:02    Labs:  CBC: Recent Labs    11/09/19 1601 11/10/19 0638 11/11/19 0456 11/13/19 0507  WBC 13.5* 12.1* 16.2* 16.1*  HGB 12.0 10.8* 10.8* 8.8*  HCT 39.0 33.7* 33.1* 27.9*  PLT 489* 389 337 328    COAGS: Recent Labs    11/09/19 1758  INR 1.1  APTT 30    BMP: Recent Labs    11/10/19 0638 11/10/19 1249 11/11/19 0456 11/13/19 0507  NA 137 137 134* 136  K 3.8 4.6 4.0 4.0  CL 105 104 105 107  CO2 23 18* 19* 21*  GLUCOSE 129* 118* 111* 107*  BUN 21* 21* 26* 17  CALCIUM 7.6* 7.4* 7.1* 7.2*  CREATININE 1.81* 1.65* 1.81* 0.99  GFRNONAA 36* 40* 36* >60  GFRAA 42* 47* 42* >60    LIVER FUNCTION TESTS: Recent Labs    11/09/19 1601  BILITOT 1.0  AST 21  ALT 30  ALKPHOS 105  PROT 8.6*  ALBUMIN 3.5    TUMOR MARKERS: No results for input(s): AFPTM, CEA, CA199, CHROMGRNA in the last 8760 hours.  Assessment and Plan: Complicated sigmoid diverticulitis Patient with persistent abdominal pain, elevated WBC, and fevers. Repeat imaging show interval growth and organization of abscess within the lower abdomen.  IR consulted for aspiration and drainage of fluid collection.  Imaging reviewed by Dr. Fredia Sorrow.  Although multiple collections are presents, only 1 is likely amenable to drainage.  Plan for aspiration/drainage via transgluteal approach.  This was discussed with the patient and her mother at bedside. They are agreeable to proceed. She understands only one collection is accessible at this time, future drains may be needed, and that it is possible she will keep these drains at  d/c unless surgery is ultimately needed.  She continues cipro and flagyl for now.   Risks and benefits discussed with the patient including bleeding, infection, damage to adjacent structures, bowel perforation/fistula connection, and sepsis.  All of the patient's questions were answered, patient is agreeable to proceed. Consent signed and in chart.    Thank you for this interesting consult.  I greatly enjoyed meeting Amanda Reyes and look forward to participating in their care.  A copy of this report was sent to the requesting provider on this date.  Electronically Signed: Hoyt Koch, PA 11/13/2019, 2:12 PM   I spent a total of 40 Minutes    in face to face in clinical consultation, greater than 50% of which was counseling/coordinating care for sigmoid diverticulitis.

## 2019-11-13 NOTE — Procedures (Signed)
Interventional Radiology Procedure Note  Procedure: CT Guided Drainage of diverticular abscess  Complications: None  Estimated Blood Loss: < 10 mL  Findings: 10 Fr drain placed in cul-de-sac abscess via right transgluteal approach with return of purulent fluid. Fluid sample sent for culture analysis. Drain attached to suction bulb drainage.  Will follow.  Jodi Marble. Fredia Sorrow, M.D Pager:  [phone]

## 2019-11-13 NOTE — Progress Notes (Signed)
Patient's MEWS score turned to Red at 1943 vital sign check.Patient in NAD.  She has been going back and forth from Red to Yellow. Her temp was up at this check which made her MEWS turn Red. Tylenol was given and her temp came down and her MEWS returned to yellow. Patient's HR and resp are increased which keeps her MEWS yellow. Will continue to monitor patient.

## 2019-11-14 DIAGNOSIS — K651 Peritoneal abscess: Secondary | ICD-10-CM

## 2019-11-14 LAB — GLUCOSE, CAPILLARY
Glucose-Capillary: 128 mg/dL — ABNORMAL HIGH (ref 70–99)
Glucose-Capillary: 97 mg/dL (ref 70–99)
Glucose-Capillary: 103 mg/dL — ABNORMAL HIGH (ref 70–99)
Glucose-Capillary: 102 mg/dL — ABNORMAL HIGH (ref 70–99)
Glucose-Capillary: 112 mg/dL — ABNORMAL HIGH (ref 70–99)

## 2019-11-14 LAB — CULTURE, BLOOD (ROUTINE X 2)
Culture: NO GROWTH
Culture: NO GROWTH
Special Requests: ADEQUATE

## 2019-11-14 NOTE — Progress Notes (Signed)
   11/14/19 2042  Assess: MEWS Score  Temp (!) 100.9 F (38.3 C)  BP (!) 150/90  Pulse Rate (!) 120  Resp 20  SpO2 100 %  O2 Device Room Air  Assess: MEWS Score  MEWS Temp 1  MEWS Systolic 0  MEWS Pulse 2  MEWS RR 0  MEWS LOC 0  MEWS Score 3  MEWS Score Color Yellow  Assess: if the MEWS score is Yellow or Red  Were vital signs taken at a resting state? Yes  Focused Assessment No change from prior assessment  Early Detection of Sepsis Score *See Row Information* Low  MEWS guidelines implemented *See Row Information* No, previously yellow, continue vital signs every 4 hours    Previously YELLOW. No additional interventions needed for now. Will give PRN antipyretic and analgesia. Will continue Q4 VS overnight.

## 2019-11-14 NOTE — Progress Notes (Signed)
[facility] SURGICAL PROGRESS NOTE (cpt [phone])  Hospital Day(s): 5.  Post percutaneous drainage of pelvic abscess, day 1.  Interval History: Patient seen and examined. This morning, patient reports she feels some better.  She denies any nausea/vomiting.  No new fevers post drainage. Leukocytosis last noted at 16.1K. Renal function improving with. UO -1700 in last 24 hours.  Her drain has put out 238 mL of purulent fluid. She appears to be tolerating the drain pain better today.  She appears to be cooperating well with physical therapy.  Review of Systems:  Constitutional: + fever, denied chills  HEENT: denies cough or congestion  Respiratory: denies any shortness of breath  Cardiovascular: denies chest pain or palpitations  Gastrointestinal: + abdominal pain, + subjective distension, denied N/V/D Genitourinary: denies burning with urination or urinary frequency Musculoskeletal: denies pain, decreased motor or sensation   Vital signs in last 24 hours: [min-max] current  Temp:  [98.4 F (36.9 C)-99.8 F (37.7 C)] 99.2 F (37.3 C) (09/04 1135) Pulse Rate:  [101-127] 108 (09/04 1136) Resp:  [12-38] 24 (09/04 1135) BP: (90-144)/(63-109) 141/93 (09/04 1136) SpO2:  [95 %-100 %] 100 % (09/04 1135)     Height: 5\' 9"  (175.3 cm) Weight: (!) 163.3 kg BMI (Calculated): 53.14   Intake/Output last 2 shifts:  09/03 0701 - 09/04 0700 In: 10  Out: 1938 [Urine:1700; Drains:238]   Physical Exam:  Constitutional: alert, cooperative, no appreciable distress. HENT: normocephalic without obvious abnormality  Eyes: PERRL, EOM's grossly intact and symmetric  Respiratory: breathing non-labored at rest  Cardiovascular: Tachycardic and sinus rhythm  Gastrointestinal: Abdomen is obese which makes reliable examination challenging, she does appear most tender in LLQ, she feels less firm and distended, no rebound/guarding.   Genitourinary: Foley in place.  Transgluteal pelvic drain in  place.   Labs:  CBC Latest Ref Rng & Units 11/13/2019 11/11/2019 11/10/2019  WBC 4.0 - 10.5 K/uL 16.1(H) 16.2(H) 12.1(H)  Hemoglobin 12.0 - 15.0 g/dL 11/12/2019) 10.8(L) 10.8(L)  Hematocrit 36 - 46 % 27.9(L) 33.1(L) 33.7(L)  Platelets 150 - 400 K/uL 328 337 389   CMP Latest Ref Rng & Units 11/13/2019 11/11/2019 11/10/2019  Glucose 70 - 99 mg/dL 11/12/2019) 144(Y) 185(U)  BUN 6 - 20 mg/dL 17 314(H) 70(Y)  Creatinine 0.44 - 1.00 mg/dL 63(Z 8.58) 8.50(Y)  Sodium 135 - 145 mmol/L 136 134(L) 137  Potassium 3.5 - 5.1 mmol/L 4.0 4.0 4.6  Chloride 98 - 111 mmol/L 107 105 104  CO2 22 - 32 mmol/L 21(L) 19(L) 18(L)  Calcium 8.9 - 10.3 mg/dL 7.2(L) 7.1(L) 7.4(L)  Total Protein 6.5 - 8.1 g/dL - - -  Total Bilirubin 0.3 - 1.2 mg/dL - - -  Alkaline Phos 38 - 126 U/L - - -  AST 15 - 41 U/L - - -  ALT 0 - 44 U/L - - -     Imaging studies:  CLINICAL DATA:  Left lower quadrant abdominal pain, perforated diverticulitis  EXAM: CT ABDOMEN AND PELVIS WITH CONTRAST  TECHNIQUE: Multidetector CT imaging of the abdomen and pelvis was performed using the standard protocol following bolus administration of intravenous contrast.  CONTRAST:  7.74(J OMNIPAQUE IOHEXOL 300 MG/ML  SOLN  COMPARISON:  11/09/2019  FINDINGS: Lower chest: Small right pleural effusion has developed with minimal right basilar compressive atelectasis. Left lung bases clear. Visualized heart and pericardium are unremarkable.  Hepatobiliary: Cholecystectomy has been performed. Mild pneumobilia is present in keeping with probable prior sphincterotomy. Mild hepatomegaly is stable. No focal intrahepatic  lesion identified.  Pancreas: Unremarkable  Spleen: Unremarkable  Adrenals/Urinary Tract: Stable probable cortical cyst within the right upper pole and punctate 2 mm nonobstructing calculus within the right lower pole. The kidneys and adrenal glands are otherwise unremarkable. Foley catheter balloon is seen within a  decompressed bladder lumen.  Stomach/Bowel: There is progressive distension of multiple proximal loops of small bowel with gradual transition to more normal caliber small bowel within the area of inflammation within the pelvis suggesting changes of a a partial small bowel obstruction and/or developing ileus secondary to the inflammatory pelvic process. The mid and distal small bowel as well as the colon is decompressed. There is increasing mesenteric infiltration within the pelvis, peritoneal enhancement within the pelvis, and shotty mesenteric and retroperitoneal adenopathy all in keeping with progressive inflammatory change. A loculated pericolonic gas and fluid collection has developed within the cul-de-sac in keeping with a focal abscess measuring 6.8 x 8.7 cm at axial image # 83/2. There is increasing, more amorphous extraluminal gas and fluid within the left hemipelvis and anterior peritoneum within the hypogastric region best seen at axial image # 66/2 and 76/2, however, these do not demonstrate a well-defined brim at this time. A similar collection is noted within the deep pelvis within the leaves of the mesentery measuring roughly 3.5 cm at axial image # 72/2. The appendix is unremarkable.  Vascular/Lymphatic: No frankly pathologic adenopathy within the abdomen and pelvis. The abdominal vasculature is unremarkable.  Reproductive: Uterus and bilateral adnexa are unremarkable.  Other: Increasing subcutaneous edema is noted within pannus and flanks bilaterally.  Musculoskeletal: No acute or significant osseous findings.  IMPRESSION: 1. Interval development of a 6.8 x 8.7 cm loculated pericolonic gas and fluid collection within the cul-de-sac in keeping with a focal abscess. There is increasing, more amorphous extraluminal gas and fluid within the left hemipelvis, deep pelvis and anterior peritoneum within the hypogastric region, however, these do not demonstrate a  well-defined brim at this time. 2. Increasing distension of multiple proximal loops of small bowel with gradual transition to more normal caliber small bowel within the area of inflammation within the pelvis suggesting changes of a partial small bowel obstruction and/or developing ileus secondary to the inflammatory pelvic process. 3. Progressive inflammatory change within the pelvis. 4. Increasing subcutaneous edema within pannus and flanks bilaterally in keeping with developing anasarca   Electronically Signed   By: Helyn Numbers MD   On: 11/12/2019 23:02    Assessment/Plan: (ICD-10's: K57.20) 34 y.o. female with intermittent fevers, leukocytosis, and persistent AKI despite aggressive resuscitative efforts, admitted with complicated sigmoid diverticulitis with likely micro-perforation without abscess, complicated by pertinent comorbidities including morbid obesity   -Appreciate Dr. Antonietta Jewel successful abscess drainage yesterday,  - It will remain in her best interest if we can continue to manage this conservatively without operative intervention.  I do not think we are "out of the woods" yet, and we will continue to do serial examinations.   She understands there may be need for further drain placement.  She understands the potential need for temporizing colostomy should we need to intervene.              - IV ABx (Cipro + Flagyl); could switch to Meropenem pending potential abscess drainage, and ongoing fevers..              - Monitor abdominal examination; on-going bowel function        -We will determine timing for repeat CT imaging while monitoring her response to  her current drain.   - Antipyretics prn  - Pain control prn; antiemetics prn             - Monitor leukocytosis, renal function             - Continue foley for now for monitoring, and for poor mobilization.             - Home meds             - mobilize as tolerated             - DVT prophylaxis, held for  percutaneous abscess drainage today.  All of the above findings and recommendations were discussed with the patient, and all of her questions were answered to her expressed satisfaction.   Campbell Lerner, MD [facility] 11/14/2019, 4:15 PM

## 2019-11-14 NOTE — Progress Notes (Signed)
Mobility Specialist - Progress Note   11/14/19 1406  Mobility  Activity  (Bed exercises)  Range of Motion/Exercises Right leg;Left leg  Level of Assistance Modified independent, requires aide device or extra time  Assistive Device None  Distance Ambulated (ft) 0 ft  Mobility Response Tolerated well  Mobility performed by Mobility specialist  $Mobility charge 1 Mobility    Pre-mobility: 117 HR, 162/97 BP, 97% SpO2 Post-mobility: 121 HR, 168/97 BP, 93% SpO2   Pt was lying in bed upon arrival. Pt agreed to session. Pt c/o nausea "7/10". Pt appeared very lethargic this session, but was able to acknowledge mobility tech and answer short yes/no questions. Pt performed bed exercises (ankle pumps, ankle circles, hip isometrics, quad sets). O2 desat to 93%, pt denied SOB. Overall, pt tolerated session well. Pt remains in bed with phone/call bell in reach. Nurse was notified of performance.    Filiberto Pinks Mobility Specialist 11/14/19, 2:12 PM

## 2019-11-15 LAB — CBC
HCT: 25.8 % — ABNORMAL LOW (ref 36.0–46.0)
Hemoglobin: 8.4 g/dL — ABNORMAL LOW (ref 12.0–15.0)
MCH: 24.1 pg — ABNORMAL LOW (ref 26.0–34.0)
MCHC: 32.6 g/dL (ref 30.0–36.0)
MCV: 74.1 fL — ABNORMAL LOW (ref 80.0–100.0)
Platelets: 320 10*3/uL (ref 150–400)
RBC: 3.48 MIL/uL — ABNORMAL LOW (ref 3.87–5.11)
RDW: 18 % — ABNORMAL HIGH (ref 11.5–15.5)
WBC: 16.4 10*3/uL — ABNORMAL HIGH (ref 4.0–10.5)
nRBC: 0.3 % — ABNORMAL HIGH (ref 0.0–0.2)

## 2019-11-15 LAB — BASIC METABOLIC PANEL
Anion gap: 5 (ref 5–15)
BUN: 12 mg/dL (ref 6–20)
CO2: 25 mmol/L (ref 22–32)
Calcium: 7.7 mg/dL — ABNORMAL LOW (ref 8.9–10.3)
Chloride: 110 mmol/L (ref 98–111)
Creatinine, Ser: 0.85 mg/dL (ref 0.44–1.00)
GFR calc Af Amer: >60 mL/min (ref >60)
GFR calc non Af Amer: >60 mL/min (ref >60)
Glucose, Bld: 107 mg/dL — ABNORMAL HIGH (ref 70–99)
Potassium: 3.9 mmol/L (ref 3.5–5.1)
Sodium: 140 mmol/L (ref 135–145)

## 2019-11-15 LAB — GLUCOSE, CAPILLARY
Glucose-Capillary: 100 mg/dL — ABNORMAL HIGH (ref 70–99)
Glucose-Capillary: 110 mg/dL — ABNORMAL HIGH (ref 70–99)
Glucose-Capillary: 114 mg/dL — ABNORMAL HIGH (ref 70–99)
Glucose-Capillary: 124 mg/dL — ABNORMAL HIGH (ref 70–99)
Glucose-Capillary: 143 mg/dL — ABNORMAL HIGH (ref 70–99)

## 2019-11-15 MED ORDER — ALUM & MAG HYDROXIDE-SIMETH 200-200-20 MG/5ML PO SUSP
30.0000 mL | ORAL | Status: DC | PRN
  Administered 2019-11-15: 30 mL via ORAL
  Filled 2019-11-15 (×2): qty 30

## 2019-11-15 MED ORDER — PANTOPRAZOLE SODIUM 40 MG IV SOLR
40.0000 mg | Freq: Two times a day (BID) | INTRAVENOUS | Status: DC
  Administered 2019-11-15 – 2019-12-23 (×77): 40 mg via INTRAVENOUS
  Filled 2019-11-15 (×78): qty 40

## 2019-11-15 NOTE — Progress Notes (Signed)
   11/15/19 2049  Assess: MEWS Score  Temp 99.5 F (37.5 C)  BP 138/84  Pulse Rate (!) 110  Resp (!) 22  SpO2 99 %  O2 Device Room Air  Assess: MEWS Score  MEWS Temp 0  MEWS Systolic 0  MEWS Pulse 1  MEWS RR 1  MEWS LOC 0  MEWS Score 2  MEWS Score Color Yellow  Assess: if the MEWS score is Yellow or Red  Were vital signs taken at a resting state? Yes  Focused Assessment No change from prior assessment  Early Detection of Sepsis Score *See Row Information* Low  MEWS guidelines implemented *See Row Information* No, previously yellow, continue vital signs every 4 hours  Previously YELLOW. No additional interventions needed. Will give pain meds. Will continue Q4 VS overnight.

## 2019-11-15 NOTE — Progress Notes (Signed)
[facility] SURGICAL PROGRESS NOTE (cpt [phone])  Hospital Day(s): 6.  Post percutaneous drainage of pelvic abscess, day 2.  Interval History: Patient seen and examined. This morning, patient reports she feels pressure, complaining of some reflux.  She denies any nausea/vomiting. Ate a small bit of her [place] toast and eggs this morning. Fevers to 100.89F post drainage. Leukocytosis unchanged at 16K. Renal function UO recorded diminished.  Her drain has put out 45 mL of purulent fluid.   Review of Systems:  Constitutional: + fever, denied chills  HEENT: denies cough or congestion  Respiratory: denies any shortness of breath  Cardiovascular: denies chest pain or palpitations  Gastrointestinal: + abdominal pain, + subjective distension, denied V/D, had a bowel movement response to laxatives Genitourinary: denies burning with urination or urinary frequency Musculoskeletal: denies pain, decreased motor or sensation   Vital signs in last 24 hours: [min-max] current  Temp:  [98 F (36.7 C)-100.9 F (38.3 C)] 99.5 F (37.5 C) (09/05 1209) Pulse Rate:  [103-120] 112 (09/05 1209) Resp:  [20-24] 21 (09/05 1224) BP: (133-159)/(70-95) 159/95 (09/05 1209) SpO2:  [95 %-100 %] 96 % (09/05 1209)     Height: 5\' 9"  (175.3 cm) Weight: (!) 163.3 kg BMI (Calculated): 53.14   Intake/Output last 2 shifts:  09/04 0701 - 09/05 0700 In: 380 [P.O.:360; I.V.:5] Out: 420 [Urine:375; Drains:45]   Physical Exam:  Constitutional: alert, cooperative, uncomfortable from active reflux/distress. HENT: normocephalic without obvious abnormality  Eyes: PERRL, EOM's grossly intact and symmetric  Respiratory: breathing non-labored at rest  Cardiovascular: Tachycardic and sinus rhythm  Gastrointestinal: Abdomen is obese which makes reliable examination challenging, she does appear most tender in LLQ, no rebound/guarding.   Genitourinary: Foley in place.  Transgluteal pelvic drain in place.   Labs:   CBC Latest Ref Rng & Units 11/15/2019 11/13/2019 11/11/2019  WBC 4.0 - 10.5 K/uL 16.4(H) 16.1(H) 16.2(H)  Hemoglobin 12.0 - 15.0 g/dL 01/11/2020) 5.0(P) 10.8(L)  Hematocrit 36 - 46 % 25.8(L) 27.9(L) 33.1(L)  Platelets 150 - 400 K/uL 320 328 337   CMP Latest Ref Rng & Units 11/15/2019 11/13/2019 11/11/2019  Glucose 70 - 99 mg/dL 01/11/2020) 568(L) 275(T)  BUN 6 - 20 mg/dL 12 17 700(F)  Creatinine 0.44 - 1.00 mg/dL 74(B 4.49 6.75)  Sodium 135 - 145 mmol/L 140 136 134(L)  Potassium 3.5 - 5.1 mmol/L 3.9 4.0 4.0  Chloride 98 - 111 mmol/L 110 107 105  CO2 22 - 32 mmol/L 25 21(L) 19(L)  Calcium 8.9 - 10.3 mg/dL 7.7(L) 7.2(L) 7.1(L)  Total Protein 6.5 - 8.1 g/dL - - -  Total Bilirubin 0.3 - 1.2 mg/dL - - -  Alkaline Phos 38 - 126 U/L - - -  AST 15 - 41 U/L - - -  ALT 0 - 44 U/L - - -     Imaging studies:  CLINICAL DATA:  Left lower quadrant abdominal pain, perforated diverticulitis  EXAM: CT ABDOMEN AND PELVIS WITH CONTRAST  TECHNIQUE: Multidetector CT imaging of the abdomen and pelvis was performed using the standard protocol following bolus administration of intravenous contrast.  CONTRAST:  9.16(B OMNIPAQUE IOHEXOL 300 MG/ML  SOLN  COMPARISON:  11/09/2019  FINDINGS: Lower chest: Small right pleural effusion has developed with minimal right basilar compressive atelectasis. Left lung bases clear. Visualized heart and pericardium are unremarkable.  Hepatobiliary: Cholecystectomy has been performed. Mild pneumobilia is present in keeping with probable prior sphincterotomy. Mild hepatomegaly is stable. No focal intrahepatic lesion identified.  Pancreas: Unremarkable  Spleen: Unremarkable  Adrenals/Urinary Tract: Stable probable cortical cyst within the right upper pole and punctate 2 mm nonobstructing calculus within the right lower pole. The kidneys and adrenal glands are otherwise unremarkable. Foley catheter balloon is seen within a decompressed bladder lumen.  Stomach/Bowel:  There is progressive distension of multiple proximal loops of small bowel with gradual transition to more normal caliber small bowel within the area of inflammation within the pelvis suggesting changes of a a partial small bowel obstruction and/or developing ileus secondary to the inflammatory pelvic process. The mid and distal small bowel as well as the colon is decompressed. There is increasing mesenteric infiltration within the pelvis, peritoneal enhancement within the pelvis, and shotty mesenteric and retroperitoneal adenopathy all in keeping with progressive inflammatory change. A loculated pericolonic gas and fluid collection has developed within the cul-de-sac in keeping with a focal abscess measuring 6.8 x 8.7 cm at axial image # 83/2. There is increasing, more amorphous extraluminal gas and fluid within the left hemipelvis and anterior peritoneum within the hypogastric region best seen at axial image # 66/2 and 76/2, however, these do not demonstrate a well-defined brim at this time. A similar collection is noted within the deep pelvis within the leaves of the mesentery measuring roughly 3.5 cm at axial image # 72/2. The appendix is unremarkable.  Vascular/Lymphatic: No frankly pathologic adenopathy within the abdomen and pelvis. The abdominal vasculature is unremarkable.  Reproductive: Uterus and bilateral adnexa are unremarkable.  Other: Increasing subcutaneous edema is noted within pannus and flanks bilaterally.  Musculoskeletal: No acute or significant osseous findings.  IMPRESSION: 1. Interval development of a 6.8 x 8.7 cm loculated pericolonic gas and fluid collection within the cul-de-sac in keeping with a focal abscess. There is increasing, more amorphous extraluminal gas and fluid within the left hemipelvis, deep pelvis and anterior peritoneum within the hypogastric region, however, these do not demonstrate a well-defined brim at this time. 2. Increasing  distension of multiple proximal loops of small bowel with gradual transition to more normal caliber small bowel within the area of inflammation within the pelvis suggesting changes of a partial small bowel obstruction and/or developing ileus secondary to the inflammatory pelvic process. 3. Progressive inflammatory change within the pelvis. 4. Increasing subcutaneous edema within pannus and flanks bilaterally in keeping with developing anasarca   Electronically Signed   By: Helyn Numbers MD   On: 11/12/2019 23:02    Assessment/Plan: (ICD-10's: K1.20) 34 y.o. female with intermittent fevers, leukocytosis, and persistent AKI despite aggressive resuscitative efforts, admitted with complicated sigmoid diverticulitis with likely micro-perforation without abscess, complicated by pertinent comorbidities including morbid obesity   -Appreciate Dr. Antonietta Jewel successful abscess drainage, more may be necessary.  - It will remain in her best interest if we can continue to manage this conservatively without operative intervention.  She understands there may be need for further drain placement.  She understands the potential need for temporizing colostomy should we need to intervene.              - IV ABx (Cipro + Flagyl); anticipating likely switch to Meropenem pending abscess culture, and ongoing fevers.             - Monitor abdominal examination; on-going bowel function, will start IV Protonix twice daily for now.        -We will determine timing for repeat CT imaging while monitoring her response to her current drain.   - Antipyretics prn  - Pain control prn; antiemetics prn             -  Monitor leukocytosis, renal function             - Continue foley for now for monitoring, and for poor mobilization.             - Home meds             - mobilize as tolerated             - DVT prophylaxis  All of the above findings and recommendations were discussed with the patient, and all of her  questions were answered to her expressed satisfaction.   Campbell Lerner, MD [facility] 11/15/2019, 12:40 PM

## 2019-11-15 NOTE — Progress Notes (Signed)
Mobility Specialist - Progress Note   11/15/19 1312  Mobility  Activity Refused mobility (Cancelled)  Mobility performed by Mobility specialist     Mobility attempted session. After checking vitals, pt experienced vomiting. Session concluded. Will attempt at a more appropriate time.    Filiberto Pinks Mobility Specialist 11/15/19, 1:19 PM

## 2019-11-16 LAB — CBC
HCT: 26.2 % — ABNORMAL LOW (ref 36.0–46.0)
Hemoglobin: 8.5 g/dL — ABNORMAL LOW (ref 12.0–15.0)
MCH: 24.4 pg — ABNORMAL LOW (ref 26.0–34.0)
MCHC: 32.4 g/dL (ref 30.0–36.0)
MCV: 75.1 fL — ABNORMAL LOW (ref 80.0–100.0)
Platelets: 338 10*3/uL (ref 150–400)
RBC: 3.49 MIL/uL — ABNORMAL LOW (ref 3.87–5.11)
RDW: 18.1 % — ABNORMAL HIGH (ref 11.5–15.5)
WBC: 13.8 10*3/uL — ABNORMAL HIGH (ref 4.0–10.5)
nRBC: 0.4 % — ABNORMAL HIGH (ref 0.0–0.2)

## 2019-11-16 LAB — COMPREHENSIVE METABOLIC PANEL
ALT: 12 U/L (ref 0–44)
AST: 13 U/L — ABNORMAL LOW (ref 15–41)
Albumin: 2 g/dL — ABNORMAL LOW (ref 3.5–5.0)
Alkaline Phosphatase: 71 U/L (ref 38–126)
Anion gap: 7 (ref 5–15)
BUN: 8 mg/dL (ref 6–20)
CO2: 24 mmol/L (ref 22–32)
Calcium: 7.5 mg/dL — ABNORMAL LOW (ref 8.9–10.3)
Chloride: 108 mmol/L (ref 98–111)
Creatinine, Ser: 0.76 mg/dL (ref 0.44–1.00)
GFR calc Af Amer: >60 mL/min (ref >60)
GFR calc non Af Amer: >60 mL/min (ref >60)
Glucose, Bld: 114 mg/dL — ABNORMAL HIGH (ref 70–99)
Potassium: 3.5 mmol/L (ref 3.5–5.1)
Sodium: 139 mmol/L (ref 135–145)
Total Bilirubin: 0.5 mg/dL (ref 0.3–1.2)
Total Protein: 5.9 g/dL — ABNORMAL LOW (ref 6.5–8.1)

## 2019-11-16 LAB — GLUCOSE, CAPILLARY
Glucose-Capillary: 104 mg/dL — ABNORMAL HIGH (ref 70–99)
Glucose-Capillary: 105 mg/dL — ABNORMAL HIGH (ref 70–99)
Glucose-Capillary: 106 mg/dL — ABNORMAL HIGH (ref 70–99)
Glucose-Capillary: 110 mg/dL — ABNORMAL HIGH (ref 70–99)
Glucose-Capillary: 116 mg/dL — ABNORMAL HIGH (ref 70–99)
Glucose-Capillary: 95 mg/dL (ref 70–99)

## 2019-11-16 NOTE — Progress Notes (Signed)
Mobility Specialist - Progress Note   11/16/19 1053  Mobility  Activity  (bed exercises)  Level of Assistance Modified independent, requires aide device or extra time  Assistive Device None  Mobility Response Tolerated well  Mobility performed by Mobility specialist  $Mobility charge 1 Mobility    Pre-mobility: 112 HR, 140/96 BP, 99% SpO2 During mobility: 122 HR, 98% SpO2 Post-mobility: 117 HR, 133/103 BP, 99% SpO2    Pt laying in bed upon arrival. Pt agreed to session. Pt willing to complete bed exercises instead of getting OOB at this time d/t feeling "tired". Pt performed 20 reps/leg for each exercise: ankle pumps, slr, heel slides, hip ad/ab, and glute sets. In conclusion of session, pt states she feels "worked out". Overall, pt tolerated session well. Pt remained in bed w/ call bell and phone placed in reach. Nurse was notified.    Tangy Drozdowski Mobility Specialist  11/16/19, 10:59 AM

## 2019-11-16 NOTE — Progress Notes (Signed)
SURGICAL PROGRESS NOTE   Hospital Day(s): 7.   Post op day(s):  Marland Kitchen   Interval History: Patient seen and examined, no acute events or new complaints overnight. Patient reports feeling okay.  She was found sitting down comfortably in the commode.  She denies any worsening pain.  She denies any nausea or vomiting.  There is no pain radiation.  There is no alleviating factor.  There is no aggravating factor.  There was no fever in the last 24 hours.  Vital signs in last 24 hours: [min-max] current  Temp:  [98.9 F (37.2 C)-99.8 F (37.7 C)] 99.8 F (37.7 C) (09/06 0759) Pulse Rate:  [110-122] 114 (09/06 0759) Resp:  [20-24] 20 (09/06 0759) BP: (133-159)/(84-97) 133/93 (09/06 0759) SpO2:  [96 %-99 %] 99 % (09/06 0759)     Height: 5\' 9"  (175.3 cm) Weight: (!) 163.3 kg BMI (Calculated): 53.14   Physical Exam:  Constitutional: alert, cooperative and no distress  Respiratory: breathing non-labored at rest  Cardiovascular: regular rate and sinus rhythm  Gastrointestinal: soft, non-tender, and non-distended.  Right-sided drain with more serous fluid.  Labs:  CBC Latest Ref Rng & Units 11/16/2019 11/15/2019 11/13/2019  WBC 4.0 - 10.5 K/uL 13.8(H) 16.4(H) 16.1(H)  Hemoglobin 12.0 - 15.0 g/dL 01/13/2020) 9.5(K) 9.3(O)  Hematocrit 36 - 46 % 26.2(L) 25.8(L) 27.9(L)  Platelets 150 - 400 K/uL 338 320 328   CMP Latest Ref Rng & Units 11/16/2019 11/15/2019 11/13/2019  Glucose 70 - 99 mg/dL 01/13/2020) 245(Y) 099(I)  BUN 6 - 20 mg/dL 8 12 17   Creatinine 0.44 - 1.00 mg/dL 338(S 5.05  Sodium 135 - 145 mmol/L 139 140 136  Potassium 3.5 - 5.1 mmol/L 3.5 3.9 4.0  Chloride 98 - 111 mmol/L 108 110 107  CO2 22 - 32 mmol/L 24 25 21(L)  Calcium 8.9 - 10.3 mg/dL 7.5(L) 7.7(L) 7.2(L)  Total Protein 6.5 - 8.1 g/dL 5.9(L) - -  Total Bilirubin 0.3 - 1.2 mg/dL 0.5 - -  Alkaline Phos 38 - 126 U/L 71 - -  AST 15 - 41 U/L 13(L) - -  ALT 0 - 44 U/L 12 - -    Imaging studies: No new pertinent imaging  studies   Assessment/Plan:  This is a 34 year old female with complicated diverticulitis with abscess s/p percutaneous drainage, complicated with morbid obesity.  Patient today feeling a little bit more comfortable.  No worsening physical exam or symptoms.  She was afebrile in last 24 hours.  The leukocytosis continuing decreasing trend.  The BUN and creatinine within normal limits.  Patient will continue with the plan of IV antibiotic therapy and percutaneous drainage with possible need of repeating CT scan for reevaluation of drainage abscess versus new abscess formation if she continue doing fevers and tachycardia.  Patient will continue with DVT prophylaxis.  I encouraged the patient to ambulate.  6.73, MD

## 2019-11-17 ENCOUNTER — Inpatient Hospital Stay: Payer: BC Managed Care – PPO

## 2019-11-17 ENCOUNTER — Inpatient Hospital Stay: Payer: Self-pay

## 2019-11-17 ENCOUNTER — Encounter: Payer: Self-pay | Admitting: General Surgery

## 2019-11-17 LAB — BASIC METABOLIC PANEL
Anion gap: 10 (ref 5–15)
BUN: 6 mg/dL (ref 6–20)
CO2: 22 mmol/L (ref 22–32)
Calcium: 7.3 mg/dL — ABNORMAL LOW (ref 8.9–10.3)
Chloride: 106 mmol/L (ref 98–111)
Creatinine, Ser: 0.8 mg/dL (ref 0.44–1.00)
GFR calc Af Amer: >60 mL/min (ref >60)
GFR calc non Af Amer: >60 mL/min (ref >60)
Glucose, Bld: 108 mg/dL — ABNORMAL HIGH (ref 70–99)
Potassium: 3.5 mmol/L (ref 3.5–5.1)
Sodium: 138 mmol/L (ref 135–145)

## 2019-11-17 LAB — GLUCOSE, CAPILLARY
Glucose-Capillary: 109 mg/dL — ABNORMAL HIGH (ref 70–99)
Glucose-Capillary: 113 mg/dL — ABNORMAL HIGH (ref 70–99)
Glucose-Capillary: 101 mg/dL — ABNORMAL HIGH (ref 70–99)
Glucose-Capillary: 102 mg/dL — ABNORMAL HIGH (ref 70–99)
Glucose-Capillary: 90 mg/dL (ref 70–99)
Glucose-Capillary: 84 mg/dL (ref 70–99)

## 2019-11-17 LAB — CBC
HCT: 26.8 % — ABNORMAL LOW (ref 36.0–46.0)
Hemoglobin: 8.4 g/dL — ABNORMAL LOW (ref 12.0–15.0)
MCH: 23.9 pg — ABNORMAL LOW (ref 26.0–34.0)
MCHC: 31.3 g/dL (ref 30.0–36.0)
MCV: 76.4 fL — ABNORMAL LOW (ref 80.0–100.0)
Platelets: 380 10*3/uL (ref 150–400)
RBC: 3.51 MIL/uL — ABNORMAL LOW (ref 3.87–5.11)
RDW: 18.2 % — ABNORMAL HIGH (ref 11.5–15.5)
WBC: 16.1 10*3/uL — ABNORMAL HIGH (ref 4.0–10.5)
nRBC: 0.6 % — ABNORMAL HIGH (ref 0.0–0.2)

## 2019-11-17 MED ORDER — CHLORHEXIDINE GLUCONATE CLOTH 2 % EX PADS
6.0000 | MEDICATED_PAD | Freq: Once | CUTANEOUS | Status: AC
  Administered 2019-11-17: 6 via TOPICAL

## 2019-11-17 MED ORDER — IOHEXOL 300 MG/ML  SOLN
125.0000 mL | Freq: Once | INTRAMUSCULAR | Status: AC | PRN
  Administered 2019-11-17: 125 mL via INTRAVENOUS

## 2019-11-17 MED ORDER — IOHEXOL 9 MG/ML PO SOLN
500.0000 mL | ORAL | Status: AC
  Administered 2019-11-17: 500 mL via ORAL

## 2019-11-17 MED ORDER — ENOXAPARIN SODIUM 40 MG/0.4ML ~~LOC~~ SOLN: 40.0000 mg | Freq: Two times a day (BID) | SUBCUTANEOUS | Status: DC

## 2019-11-17 NOTE — Progress Notes (Signed)
   11/17/19 1018  Assess: MEWS Score  Temp (!) 100.5 F (38.1 C)  BP 109/70  Pulse Rate (!) 120  Resp (!) 28  SpO2 98 %  O2 Device Room Air  Assess: MEWS Score  MEWS Temp 1  MEWS Systolic 0  MEWS Pulse 2  MEWS RR 2  MEWS LOC 0  MEWS Score 5  MEWS Score Color Red  Assess: if the MEWS score is Yellow or Red  Were vital signs taken at a resting state? Yes  Focused Assessment No change from prior assessment  Early Detection of Sepsis Score *See Row Information* High  MEWS guidelines implemented *See Row Information* Yes  Treat  MEWS Interventions Administered prn meds/treatments  Pain Scale 0-10  Pain Score 8  Pain Type Acute pain  Take Vital Signs  Increase Vital Sign Frequency  Red: Q 1hr X 4 then Q 4hr X 4, if remains red, continue Q 4hrs  Notify: Charge Nurse/RN  Name of Charge Nurse/RN Notified Anabella RN  Date Charge Nurse/RN Notified 11/17/19  Time Charge Nurse/RN Notified 1120  Notify: Provider  Provider Name/Title Lincoln Brigham  Date Provider Notified 11/17/19  Time Provider Notified 1128  Response See new orders   Patient going to OR tomorrow. Tylenol given for fever. Will continue to monitor

## 2019-11-17 NOTE — Progress Notes (Signed)
Mobility Specialist - Progress Note   11/17/19 1405  Mobility  Activity Refused mobility (Cancellation)  Mobility performed by Mobility specialist     Per discussion w/ nurse, pt not appropriate to see for mobility session this afternoon d/t pt having a fever, elevated HR (120 bpm) and elevated RR (28). Will re-attempt session at a later date/time when appropriate.      Deleon Passe Mobility Specialist  11/17/19, 2:07 PM

## 2019-11-17 NOTE — Progress Notes (Signed)
Initial Nutrition Assessment  DOCUMENTATION CODES:   Morbid obesity  INTERVENTION:  Plan is for initiation of TPN per pharmacy tomorrow.  Recommend measuring daily weights on TPN.  Monitor magnesium, potassium, and phosphorus daily for at least 3 days, MD to replete as needed, as pt is at risk for refeeding syndrome.  NUTRITION DIAGNOSIS:   Inadequate oral intake related to inability to eat as evidenced by NPO status.  GOAL:   Patient will meet greater than or equal to 90% of their needs  MONITOR:   Labs, Weight trends, I & O's  REASON FOR ASSESSMENT:   Consult New TPN/TNA  ASSESSMENT:   34 year old female with PMHx of HTN admitted with perforated diverticulitis with contained abscesses.   -Per surgery note today there is evidence of worsening diverticulitis with 3 separate collections. Plan is for robotic exploration and possibly Hartman's depending on operative findings. Patient is being made NPO today and PICC is being placed for TPN.  Met with patient and her mother at bedside. Patient reports she has not been able to eat well since 8/24. She began developing abdominal pain and emesis. She reports she has been trying to eat (noted diet was advanced to regular on 9/5) but has not been able to tolerate. She reports only trying small amounts (sips/bites). She had a large episode of emesis last night. Patient and her mother reports they were not aware plan was for NPO and PICC placement for TPN. They are requesting RD message surgery team and ask them to come back to room. RD discussed with RN and she has already let surgery team know and plan is for them to return to talk with patient and mother.  Patient denies any weight loss and reports she is weight-stable. Current weight in chart is 163.3 kg (360 lbs).  Medications reviewed and include: Novolog 0-15 units Q4hrs, Protonix, Cipro, Flagyl.  Labs reviewed: CBG 101-102.  I/O: 525 mL UOP yesterday + 2 occurrences  unmeasured UOP; 30 mL output from right JP drain yesterday  IV Access: order for PICC placement today  Discussed with RN and Pharmacy. Plan is for TPN to initiate tomorrow as it is too late in the day to start today.  NUTRITION - FOCUSED PHYSICAL EXAM:    Most Recent Value  Orbital Region No depletion  Upper Arm Region No depletion  Thoracic and Lumbar Region No depletion  Buccal Region No depletion  Temple Region No depletion  Clavicle Bone Region No depletion  Clavicle and Acromion Bone Region No depletion  Scapular Bone Region Unable to assess  Dorsal Hand No depletion  Patellar Region No depletion  Anterior Thigh Region No depletion  Posterior Calf Region No depletion  Edema (RD Assessment) --  [non-pitting]  Hair Reviewed  Eyes Reviewed  Mouth Reviewed  Skin Reviewed  Nails Reviewed     Diet Order:   Diet Order            Diet NPO time specified Except for: Sips with Meds  Diet effective now                EDUCATION NEEDS:   No education needs have been identified at this time  Skin:  Skin Assessment: Reviewed RN Assessment  Last BM:  11/16/2019 - medium type 7  Height:   Ht Readings from Last 1 Encounters:  11/09/19 '5\' 9"'  (1.753 m)   Weight:   Wt Readings from Last 1 Encounters:  11/09/19 (!) 163.3 kg   BMI:  Body mass index is 53.16 kg/m.  Estimated Nutritional Needs:   Kcal:  2600-2800  Protein:  130-140 grams  Fluid:  >/= 2 L/day  Jacklynn Barnacle, MS, RD, LDN Pager number available on Amion

## 2019-11-17 NOTE — Progress Notes (Signed)
PICC order received.  Spoke with [place], RN made aware that PICC will be placed 9-8.

## 2019-11-17 NOTE — Progress Notes (Signed)
Arrived to discuss PICC placement with patient. Patient in pain, not wanting to discuss at present. Primary RN Mia notified.

## 2019-11-17 NOTE — Progress Notes (Signed)
[facility] SURGICAL PROGRESS NOTE (cpt [phone])  Hospital Day(s): 8.   Interval History: Patient seen and examined, no acute events or new complaints overnight. Patient reports she continues to have intermittent sharp pain in her abdomen, primarily in the central region. She also had a large episode of emesis overnight but was unsure why. She denies fever, chills, nausea, urinary changes, bowel changes this morning. She did have worsening of her leukocytosis this morning with WBC of 16.1K. BMP is reassuring this morning, renal function remains normal with sCr - 0.80, good UO. Percutaneous drain with 80 ccs out in last 24 hours, serous. Cultures from drain placement being re-incubated for growth. She was on regular diet but apparently had large episode of emesis overnight. Plan for repeat imaging this morning.   Review of Systems:  Constitutional: denies fever, chills  HEENT: denies cough or congestion  Respiratory: denies any shortness of breath  Cardiovascular: denies chest pain or palpitations  Gastrointestinal: + abdominal pain, + Vomitting, denied nausea or diarrhea/and bowel function as per interval history Genitourinary: denies burning with urination or urinary frequency Musculoskeletal: denies pain, decreased motor or sensation  Vital signs in last 24 hours: [min-max] current  Temp:  [99.2 F (37.3 C)-100.3 F (37.9 C)] 99.3 F (37.4 C) (09/07 0421) Pulse Rate:  [110-136] 129 (09/07 0421) Resp:  [20] 20 (09/07 0421) BP: (133-175)/(88-117) 151/99 (09/07 0421) SpO2:  [97 %-99 %] 98 % (09/07 0421)     Height: 5\' 9"  (175.3 cm) Weight: (!) 163.3 kg BMI (Calculated): 53.14   Intake/Output last 2 shifts:  09/06 0701 - 09/07 0700 In: -  Out: 555 [Urine:525; Drains:30]   Physical Exam:  Constitutional: alert, cooperative and no distress  HENT: normocephalic without obvious abnormality  Eyes: PERRL, EOM's grossly intact and symmetric  Respiratory: breathing non-labored  at rest  Cardiovascular: Tachycardic and sinus rhythm  Gastrointestinal: Morbidly obese, Soft, she is tender centrally and in her suprapubic region, distension is difficult to assess secondary to habitus, I do not appreciate any rebound. Percutaneous drain in right back with more serous appearing output  Musculoskeletal: no edema or wounds, motor and sensation grossly intact, NT    Labs:  CBC Latest Ref Rng & Units 11/17/2019 11/16/2019 11/15/2019  WBC 4.0 - 10.5 K/uL 16.1(H) 13.8(H) 16.4(H)  Hemoglobin 12.0 - 15.0 g/dL 01/15/2020) 6.9(S) 8.5(I)  Hematocrit 36 - 46 % 26.8(L) 26.2(L) 25.8(L)  Platelets 150 - 400 K/uL 380 338 320   CMP Latest Ref Rng & Units 11/17/2019 11/16/2019 11/15/2019  Glucose 70 - 99 mg/dL 01/15/2020) 035(K) 093(G)  BUN 6 - 20 mg/dL 6 8 12   Creatinine 0.44 - 1.00 mg/dL 182(X 9.37  Sodium 135 - 145 mmol/L 138 139 140  Potassium 3.5 - 5.1 mmol/L 3.5 3.5 3.9  Chloride 98 - 111 mmol/L 106 108 110  CO2 22 - 32 mmol/L 22 24 25   Calcium 8.9 - 10.3 mg/dL 7.3(L) 7.5(L) 7.7(L)  Total Protein 6.5 - 8.1 g/dL - 5.9(L) -  Total Bilirubin 0.3 - 1.2 mg/dL - 0.5 -  Alkaline Phos 38 - 126 U/L - 71 -  AST 15 - 41 U/L - 13(L) -  ALT 0 - 44 U/L - 12 -     Imaging studies: No new pertinent imaging studies, CT Abdomen/Pelvis pending   Assessment/Plan: (ICD-10's: K86.20) 34 y.o. female with worsening leukocytosis and tachycardia this morning and persistent abdominal pain admitted with complicated sigmoid diverticulitis with likely micro-perforation and eventual development of abscess s/p percutaneous  drainage on 09/03, complicated by pertinent comorbidities includingmorbid obesity   - We will repeat CT Abdomen/Pelvis this morning given tachycardia and leukocytosis to ensure no worsening of her intra-abdominal process.       - NPO for imaging; re-address diet following completion   - IVF Resuscitation   - IV ABx (Cipro + Flagyl); Cx from drainage still pending final growth  - Monitor  abdominal examination; on-going bowel function             - Pain control prn; antiemetics prn - Monitor leukocytosis, renal function\ - Home medications - Mobilization encouraged as tolerated - DVT prophylaxis; ? Being held  All of the above findings and recommendations were discussed with the patient, and the medical team, and all of patient's questions were answered to her expressed satisfaction.  -- Lynden Oxford, PA-C [facility] 11/17/2019, 7:29 AM [phone] M-F: 7am - 4pm

## 2019-11-17 NOTE — Anesthesia Preprocedure Evaluation (Addendum)
Anesthesia Evaluation  Patient identified by MRN, date of birth, ID band Patient awake    Reviewed: Allergy & Precautions, H&P , NPO status , Patient's Chart, lab work & pertinent test results  History of Anesthesia Complications Negative for: history of anesthetic complications  Airway Mallampati: IV  TM Distance: >3 FB Neck ROM: Full    Dental no notable dental hx.    Pulmonary neg pulmonary ROS, neg sleep apnea, neg COPD,    breath sounds clear to auscultation- rhonchi (-) wheezing      Cardiovascular hypertension, Pt. on medications (-) angina(-) Past MI, (-) Cardiac Stents and (-) CABG (-) dysrhythmias  Rhythm:Regular Rate:Normal - Systolic murmurs and - Diastolic murmurs    Neuro/Psych neg Seizures negative neurological ROS  negative psych ROS   GI/Hepatic negative GI ROS, Neg liver ROS, Findings c/w hepatic steatosis on CT Diverticulitis with colonic perforation with development of multiple pelvic abscesses, s/p percutaneous drain placement 9/3. Failed medical management, has persistent fevers, tachycardia, worsening leukocytosis despite antibiotic therapy and perc drain.  Presenting for robotic colectomy   Endo/Other  neg diabetesMorbid obesity (super morbid obesity)  Renal/GU negative Renal ROS     Musculoskeletal negative musculoskeletal ROS (+)   Abdominal (+) + obese,   Peds  Hematology  (+) Blood dyscrasia, anemia , Hgb 8.4   Anesthesia Other Findings Past Medical History: No date: Hypertension   Reproductive/Obstetrics negative OB ROS                          Lab Results  Component Value Date   WBC 20.0 (H) 11/18/2019   HGB 8.4 (L) 11/18/2019   HCT 26.5 (L) 11/18/2019   MCV 74.4 (L) 11/18/2019   PLT 397 11/18/2019    Anesthesia Physical Anesthesia Plan  ASA: III  Anesthesia Plan: General ETT   Post-op Pain Management:    Induction: Intravenous  PONV Risk Score  and Plan: 2 and Ondansetron, Dexamethasone, Midazolam and Treatment may vary due to age or medical condition  Airway Management Planned: Oral ETT and Video Laryngoscope Planned  Additional Equipment: Arterial line  Intra-op Plan:   Post-operative Plan: Extubation in OR and Possible Post-op intubation/ventilation  Informed Consent: I have reviewed the patients History and Physical, chart, labs and discussed the procedure including the risks, benefits and alternatives for the proposed anesthesia with the patient or authorized representative who has indicated his/her understanding and acceptance.     Dental Advisory Given  Plan Discussed with: Anesthesiologist, CRNA and Surgeon  Anesthesia Plan Comments:       Anesthesia Quick Evaluation

## 2019-11-17 NOTE — Progress Notes (Signed)
Returned to discuss PICC placement. Patient requested waiting until 9-8 to discuss placement. Mia Primary RN notified.

## 2019-11-18 ENCOUNTER — Inpatient Hospital Stay: Payer: BC Managed Care – PPO | Admitting: Anesthesiology

## 2019-11-18 ENCOUNTER — Encounter
Admission: EM | Disposition: A | Discharge status: Home / Self Care | Payer: Self-pay | Source: Home / Self Care | Attending: Surgery

## 2019-11-18 ENCOUNTER — Inpatient Hospital Stay: Payer: BC Managed Care – PPO

## 2019-11-18 DIAGNOSIS — K5720 Diverticulitis of large intestine with perforation and abscess without bleeding: Secondary | ICD-10-CM

## 2019-11-18 DIAGNOSIS — J9601 Acute respiratory failure with hypoxia: Secondary | ICD-10-CM

## 2019-11-18 HISTORY — PX: IRRIGATION AND DEBRIDEMENT ABDOMEN: SHX6600

## 2019-11-18 HISTORY — PX: CYSTOSCOPY: SHX5120

## 2019-11-18 HISTORY — PX: ROBOTIC ASSISTED LAPAROSCOPIC LYSIS OF ADHESION: SHX6080

## 2019-11-18 LAB — ABO/RH: ABO/RH(D): A POS

## 2019-11-18 LAB — CBC
HCT: 26.5 % — ABNORMAL LOW (ref 36.0–46.0)
Hemoglobin: 8.4 g/dL — ABNORMAL LOW (ref 12.0–15.0)
MCH: 23.6 pg — ABNORMAL LOW (ref 26.0–34.0)
MCHC: 31.7 g/dL (ref 30.0–36.0)
MCV: 74.4 fL — ABNORMAL LOW (ref 80.0–100.0)
Platelets: 397 10*3/uL (ref 150–400)
RBC: 3.56 MIL/uL — ABNORMAL LOW (ref 3.87–5.11)
RDW: 18.6 % — ABNORMAL HIGH (ref 11.5–15.5)
WBC: 20 10*3/uL — ABNORMAL HIGH (ref 4.0–10.5)
nRBC: 0.5 % — ABNORMAL HIGH (ref 0.0–0.2)

## 2019-11-18 LAB — BLOOD GAS, ARTERIAL
Acid-base deficit: 5.9 mmol/L — ABNORMAL HIGH (ref 0.0–2.0)
Acid-base deficit: 9.1 mmol/L — ABNORMAL HIGH (ref 0.0–2.0)
Bicarbonate: 20.7 mmol/L (ref 20.0–28.0)
Bicarbonate: 19.3 mmol/L — ABNORMAL LOW (ref 20.0–28.0)
FIO2: 100
MECHVT: 500 mL
O2 Saturation: 93.9 %
O2 Saturation: 99.6 %
PEEP: 5 cmH2O
Patient temperature: 37
Patient temperature: 37
RATE: 14 resp/min
pCO2 arterial: 45 mmHg (ref 32.0–48.0)
pCO2 arterial: 53 mmHg — ABNORMAL HIGH (ref 32.0–48.0)
pH, Arterial: 7.27 — ABNORMAL LOW (ref 7.350–7.450)
pH, Arterial: 7.17 — CL (ref 7.350–7.450)
pO2, Arterial: 80 mmHg — ABNORMAL LOW (ref 83.0–108.0)
pO2, Arterial: 207 mmHg — ABNORMAL HIGH (ref 83.0–108.0)

## 2019-11-18 LAB — BASIC METABOLIC PANEL
Anion gap: 4 — ABNORMAL LOW (ref 5–15)
BUN: 5 mg/dL — ABNORMAL LOW (ref 6–20)
CO2: 24 mmol/L (ref 22–32)
Calcium: 7.4 mg/dL — ABNORMAL LOW (ref 8.9–10.3)
Chloride: 109 mmol/L (ref 98–111)
Creatinine, Ser: 0.93 mg/dL (ref 0.44–1.00)
GFR calc Af Amer: >60 mL/min (ref >60)
GFR calc non Af Amer: >60 mL/min (ref >60)
Glucose, Bld: 103 mg/dL — ABNORMAL HIGH (ref 70–99)
Potassium: 3.5 mmol/L (ref 3.5–5.1)
Sodium: 137 mmol/L (ref 135–145)

## 2019-11-18 LAB — GLUCOSE, CAPILLARY
Glucose-Capillary: 112 mg/dL — ABNORMAL HIGH (ref 70–99)
Glucose-Capillary: 112 mg/dL — ABNORMAL HIGH (ref 70–99)
Glucose-Capillary: 120 mg/dL — ABNORMAL HIGH (ref 70–99)
Glucose-Capillary: 92 mg/dL (ref 70–99)
Glucose-Capillary: 95 mg/dL (ref 70–99)

## 2019-11-18 LAB — TRIGLYCERIDES: Triglycerides: 69 mg/dL (ref <150)

## 2019-11-18 LAB — MAGNESIUM: Magnesium: 1.8 mg/dL (ref 1.7–2.4)

## 2019-11-18 LAB — PREGNANCY, URINE: Preg Test, Ur: NEGATIVE

## 2019-11-18 LAB — MRSA PCR SCREENING: MRSA by PCR: NEGATIVE

## 2019-11-18 LAB — PHOSPHORUS: Phosphorus: 2.8 mg/dL (ref 2.5–4.6)

## 2019-11-18 LAB — HEMOGLOBIN AND HEMATOCRIT, BLOOD
HCT: 26.5 % — ABNORMAL LOW (ref 36.0–46.0)
Hemoglobin: 8.7 g/dL — ABNORMAL LOW (ref 12.0–15.0)

## 2019-11-18 SURGERY — CYSTOSCOPY
Anesthesia: General | Site: Ureter

## 2019-11-18 MED ORDER — PHENYLEPHRINE HCL (PRESSORS) 10 MG/ML IV SOLN
INTRAVENOUS | Status: DC | PRN
  Administered 2019-11-18: 250 ug via INTRAVENOUS
  Administered 2019-11-18 (×2): 200 ug via INTRAVENOUS
  Administered 2019-11-18 (×2): 250 ug via INTRAVENOUS

## 2019-11-18 MED ORDER — ROCURONIUM BROMIDE 10 MG/ML (PF) SYRINGE
PREFILLED_SYRINGE | INTRAVENOUS | Status: AC
  Filled 2019-11-18: qty 10

## 2019-11-18 MED ORDER — ALBUMIN HUMAN 5 % IV SOLN: INTRAVENOUS | Status: DC | PRN

## 2019-11-18 MED ORDER — DEXAMETHASONE SODIUM PHOSPHATE 10 MG/ML IJ SOLN
INTRAMUSCULAR | Status: AC
  Filled 2019-11-18: qty 1

## 2019-11-18 MED ORDER — CHLORHEXIDINE GLUCONATE 0.12% ORAL RINSE (MEDLINE KIT)
15.0000 mL | Freq: Two times a day (BID) | OROMUCOSAL | Status: DC
  Administered 2019-11-18 – 2019-11-19 (×3): 15 mL via OROMUCOSAL

## 2019-11-18 MED ORDER — PROPOFOL 1000 MG/100ML IV EMUL
5.0000 ug/kg/min | INTRAVENOUS | Status: DC
  Administered 2019-11-18: 60 ug/kg/min via INTRAVENOUS
  Administered 2019-11-18: 40 ug/kg/min via INTRAVENOUS
  Administered 2019-11-19: 60 ug/kg/min via INTRAVENOUS
  Filled 2019-11-18 (×3): qty 100

## 2019-11-18 MED ORDER — LIDOCAINE HCL (PF) 2 % IJ SOLN
INTRAMUSCULAR | Status: AC
  Filled 2019-11-18: qty 5

## 2019-11-18 MED ORDER — FENTANYL CITRATE (PF) 100 MCG/2ML IJ SOLN
INTRAMUSCULAR | Status: AC
  Filled 2019-11-18: qty 2

## 2019-11-18 MED ORDER — PROPOFOL 500 MG/50ML IV EMUL
INTRAVENOUS | Status: AC
  Filled 2019-11-18: qty 50

## 2019-11-18 MED ORDER — TRAVASOL 10 % IV SOLN
INTRAVENOUS | Status: DC
  Filled 2019-11-18: qty 699.6

## 2019-11-18 MED ORDER — ONDANSETRON HCL 4 MG/2ML IJ SOLN
INTRAMUSCULAR | Status: AC
  Filled 2019-11-18: qty 2

## 2019-11-18 MED ORDER — MIDAZOLAM HCL 2 MG/2ML IJ SOLN
INTRAMUSCULAR | Status: AC
  Filled 2019-11-18: qty 4

## 2019-11-18 MED ORDER — VASOPRESSIN 20 UNIT/ML IV SOLN
INTRAVENOUS | Status: AC
  Filled 2019-11-18: qty 1

## 2019-11-18 MED ORDER — CHLORHEXIDINE GLUCONATE 0.12% ORAL RINSE (MEDLINE KIT)
15.0000 mL | Freq: Two times a day (BID) | OROMUCOSAL | Status: DC
  Administered 2019-11-18 – 2019-11-19 (×2): 15 mL via OROMUCOSAL

## 2019-11-18 MED ORDER — PHENYLEPHRINE HCL (PRESSORS) 10 MG/ML IV SOLN
INTRAVENOUS | Status: AC
  Filled 2019-11-18: qty 1

## 2019-11-18 MED ORDER — ARTIFICIAL TEARS OPHTHALMIC OINT
TOPICAL_OINTMENT | OPHTHALMIC | Status: AC
  Filled 2019-11-18: qty 3.5

## 2019-11-18 MED ORDER — PROPOFOL 10 MG/ML IV BOLUS
INTRAVENOUS | Status: DC | PRN
  Administered 2019-11-18: 200 mg via INTRAVENOUS

## 2019-11-18 MED ORDER — SUGAMMADEX SODIUM 500 MG/5ML IV SOLN
INTRAVENOUS | Status: AC
  Filled 2019-11-18: qty 5

## 2019-11-18 MED ORDER — FENTANYL CITRATE (PF) 100 MCG/2ML IJ SOLN
INTRAMUSCULAR | Status: DC | PRN
Start: 2019-11-18 — End: 2019-11-18
  Administered 2019-11-18: 100 ug via INTRAVENOUS

## 2019-11-18 MED ORDER — LIDOCAINE HCL (CARDIAC) PF 100 MG/5ML IV SOSY
PREFILLED_SYRINGE | INTRAVENOUS | Status: DC | PRN
  Administered 2019-11-18: 100 mg via INTRAVENOUS

## 2019-11-18 MED ORDER — LACTATED RINGERS IV SOLN: INTRAVENOUS | Status: DC | PRN

## 2019-11-18 MED ORDER — SUCCINYLCHOLINE CHLORIDE 20 MG/ML IJ SOLN
INTRAMUSCULAR | Status: DC | PRN
  Administered 2019-11-18: 200 mg via INTRAVENOUS

## 2019-11-18 MED ORDER — MIDAZOLAM HCL 2 MG/2ML IJ SOLN
INTRAMUSCULAR | Status: AC
  Filled 2019-11-18: qty 2

## 2019-11-18 MED ORDER — FENTANYL 2500MCG IN NS 250ML (10MCG/ML) PREMIX INFUSION
0.0000 ug/h | INTRAVENOUS | Status: DC
  Administered 2019-11-18: 5 ug/h via INTRAVENOUS
  Administered 2019-11-19: 150 ug/h via INTRAVENOUS
  Administered 2019-11-19 (×2): 400 ug/h via INTRAVENOUS
  Administered 2019-11-20: 300 ug/h via INTRAVENOUS
  Filled 2019-11-18 (×4): qty 250

## 2019-11-18 MED ORDER — SODIUM CHLORIDE 0.9 % IV SOLN: INTRAVENOUS | Status: AC

## 2019-11-18 MED ORDER — SODIUM CHLORIDE 0.9 % IV BOLUS
1000.0000 mL | Freq: Once | INTRAVENOUS | Status: AC
  Administered 2019-11-18: 1000 mL via INTRAVENOUS

## 2019-11-18 MED ORDER — ALBUMIN HUMAN 5 % IV SOLN
INTRAVENOUS | Status: AC
  Filled 2019-11-18: qty 250

## 2019-11-18 MED ORDER — ORAL CARE MOUTH RINSE
15.0000 mL | OROMUCOSAL | Status: DC
  Administered 2019-11-18 – 2019-11-19 (×6): 15 mL via OROMUCOSAL

## 2019-11-18 MED ORDER — SODIUM CHLORIDE 0.9% IV SOLUTION: Freq: Once | INTRAVENOUS | Status: DC

## 2019-11-18 MED ORDER — ORAL CARE MOUTH RINSE
15.0000 mL | OROMUCOSAL | Status: DC
  Administered 2019-11-18 – 2019-11-20 (×17): 15 mL via OROMUCOSAL

## 2019-11-18 MED ORDER — SODIUM CHLORIDE 0.9 % IV SOLN
INTRAVENOUS | Status: DC | PRN
  Administered 2019-11-18: 40 ug/min via INTRAVENOUS

## 2019-11-18 MED ORDER — CHLORHEXIDINE GLUCONATE CLOTH 2 % EX PADS
6.0000 | MEDICATED_PAD | Freq: Every day | CUTANEOUS | Status: DC
  Administered 2019-11-19 – 2019-12-11 (×18): 6 via TOPICAL

## 2019-11-18 MED ORDER — ROCURONIUM BROMIDE 100 MG/10ML IV SOLN
INTRAVENOUS | Status: DC | PRN
  Administered 2019-11-18: 100 mg via INTRAVENOUS
  Administered 2019-11-18: 30 mg via INTRAVENOUS
  Administered 2019-11-18: 100 mg via INTRAVENOUS
  Administered 2019-11-18: 50 mg via INTRAVENOUS

## 2019-11-18 MED ORDER — MIDAZOLAM HCL 2 MG/2ML IJ SOLN
INTRAMUSCULAR | Status: DC | PRN
  Administered 2019-11-18: 2 mg via INTRAVENOUS
  Administered 2019-11-18: 4 mg via INTRAVENOUS

## 2019-11-18 MED ORDER — PROPOFOL 10 MG/ML IV BOLUS
INTRAVENOUS | Status: AC
  Filled 2019-11-18: qty 20

## 2019-11-18 MED ORDER — VASOPRESSIN 20 UNIT/ML IV SOLN
INTRAVENOUS | Status: DC | PRN
  Administered 2019-11-18: 2.5 [IU] via INTRAVENOUS
  Administered 2019-11-18: 4 [IU] via INTRAVENOUS
  Administered 2019-11-18: 2.5 [IU] via INTRAVENOUS
  Administered 2019-11-18: 2 [IU] via INTRAVENOUS
  Administered 2019-11-18: 1 [IU] via INTRAVENOUS
  Administered 2019-11-18 (×2): 2 [IU] via INTRAVENOUS
  Administered 2019-11-18: 1 [IU] via INTRAVENOUS
  Administered 2019-11-18: 5 [IU] via INTRAVENOUS
  Administered 2019-11-18: 1 [IU] via INTRAVENOUS
  Administered 2019-11-18: 5 [IU] via INTRAVENOUS
  Administered 2019-11-18: 2 [IU] via INTRAVENOUS
  Administered 2019-11-18: 1 [IU] via INTRAVENOUS
  Administered 2019-11-18: 5 [IU] via INTRAVENOUS
  Administered 2019-11-18: 2.5 [IU] via INTRAVENOUS
  Administered 2019-11-18: 2 [IU] via INTRAVENOUS
  Administered 2019-11-18: 5 [IU] via INTRAVENOUS
  Administered 2019-11-18 (×2): 2 [IU] via INTRAVENOUS

## 2019-11-18 MED ORDER — PROPOFOL 1000 MG/100ML IV EMUL
INTRAVENOUS | Status: AC
  Administered 2019-11-18: 40 ug
  Filled 2019-11-18: qty 100

## 2019-11-18 MED ORDER — ONDANSETRON HCL 4 MG/2ML IJ SOLN
INTRAMUSCULAR | Status: DC | PRN
  Administered 2019-11-18: 4 mg via INTRAVENOUS

## 2019-11-18 MED ORDER — STERILE WATER FOR INJECTION IV SOLN
INTRAVENOUS | Status: AC
  Filled 2019-11-18: qty 850
  Filled 2019-11-18: qty 150
  Filled 2019-11-18 (×6): qty 850
  Filled 2019-11-18: qty 150
  Filled 2019-11-18: qty 850
  Filled 2019-11-18: qty 150
  Filled 2019-11-18: qty 850

## 2019-11-18 MED ORDER — DEXAMETHASONE SODIUM PHOSPHATE 10 MG/ML IJ SOLN
INTRAMUSCULAR | Status: DC | PRN
  Administered 2019-11-18: 10 mg via INTRAVENOUS

## 2019-11-18 SURGICAL SUPPLY — 101 items
ANCHOR TIS RET SYS 1550ML (BAG) IMPLANT
BLADE SURG SZ10 CARB STEEL (BLADE) ×5 IMPLANT
BULB RESERV EVAC DRAIN JP 100C (MISCELLANEOUS) ×5 IMPLANT
CANISTER SUCT 1200ML W/VALVE (MISCELLANEOUS) ×5 IMPLANT
CANISTER SUCT 3000ML PPV (MISCELLANEOUS) ×10 IMPLANT
CANNULA REDUC XI 12-8 STAPL (CANNULA) ×1
CANNULA REDUC XI 12-8MM STAPL (CANNULA) ×1
CANNULA REDUCER 12-8 DVNC XI (CANNULA) ×3 IMPLANT
CATH FOLEY 2WAY  5CC 16FR (CATHETERS) ×2
CATH URETL 5X70 OPEN END (CATHETERS) ×5 IMPLANT
CATH URTH 16FR FL 2W BLN LF (CATHETERS) ×3 IMPLANT
CHLORAPREP W/TINT 26 (MISCELLANEOUS) ×5 IMPLANT
COVER TIP SHEARS 8 DVNC (MISCELLANEOUS) ×3 IMPLANT
COVER TIP SHEARS 8MM DA VINCI (MISCELLANEOUS) ×2
COVER WAND RF STERILE (DRAPES) ×5 IMPLANT
DECANTER SPIKE VIAL GLASS SM (MISCELLANEOUS) ×5 IMPLANT
DEFOGGER SCOPE WARMER CLEARIFY (MISCELLANEOUS) ×5 IMPLANT
DERMABOND ADVANCED (GAUZE/BANDAGES/DRESSINGS) ×2
DERMABOND ADVANCED .7 DNX12 (GAUZE/BANDAGES/DRESSINGS) ×3 IMPLANT
DRAIN CHANNEL JP 19F (MISCELLANEOUS) ×10 IMPLANT
DRAPE ARM DVNC X/XI (DISPOSABLE) ×12 IMPLANT
DRAPE COLUMN DVNC XI (DISPOSABLE) ×3 IMPLANT
DRAPE DA VINCI XI ARM (DISPOSABLE) ×8
DRAPE DA VINCI XI COLUMN (DISPOSABLE) ×2
DRAPE LEGGINS SURG 28X43 STRL (DRAPES) ×5 IMPLANT
DRAPE UNDER BUTTOCK W/FLU (DRAPES) ×10 IMPLANT
ELECT REM PT RETURN 9FT ADLT (ELECTROSURGICAL) ×5
ELECTRODE REM PT RTRN 9FT ADLT (ELECTROSURGICAL) ×3 IMPLANT
GELPOINT ADV PLATFORM (ENDOMECHANICALS) ×5
GLOVE ORTHO TXT STRL SZ7.5 (GLOVE) ×20 IMPLANT
GOWN STRL REUS W/ TWL LRG LVL3 (GOWN DISPOSABLE) ×18 IMPLANT
GOWN STRL REUS W/TWL LRG LVL3 (GOWN DISPOSABLE) ×12
GRASPER SUT TROCAR 14GX15 (MISCELLANEOUS) ×5 IMPLANT
HANDLE YANKAUER SUCT BULB TIP (MISCELLANEOUS) ×5 IMPLANT
INFUSOR MANOMETER BAG 3000ML (MISCELLANEOUS) ×5 IMPLANT
IRRIGATION STRYKERFLOW (MISCELLANEOUS) IMPLANT
IRRIGATOR STRYKERFLOW (MISCELLANEOUS)
IRRIGATOR SUCT 8 DISP DVNC XI (IRRIGATION / IRRIGATOR) ×3 IMPLANT
IRRIGATOR SUCTION 8MM XI DISP (IRRIGATION / IRRIGATOR) ×2
IV NS IRRIG 3000ML ARTHROMATIC (IV SOLUTION) ×10 IMPLANT
KIT IMAGING PINPOINTPAQ (MISCELLANEOUS) ×10 IMPLANT
KIT PINK PAD W/HEAD ARE REST (MISCELLANEOUS) ×5
KIT PINK PAD W/HEAD ARM REST (MISCELLANEOUS) ×3 IMPLANT
KIT TURNOVER KIT A (KITS) ×5 IMPLANT
NDL INSUFF 14G 150MM VS150000 (NEEDLE) ×5 IMPLANT
NEEDLE HYPO 22GX1.5 SAFETY (NEEDLE) ×5 IMPLANT
NEEDLE INSUFFLATION 14GA 120MM (NEEDLE) ×5 IMPLANT
NS IRRIG 500ML POUR BTL (IV SOLUTION) ×5 IMPLANT
PACK COLON CLEAN CLOSURE (MISCELLANEOUS) ×5 IMPLANT
PACK CYSTO AR (MISCELLANEOUS) ×5 IMPLANT
PACK LAP CHOLECYSTECTOMY (MISCELLANEOUS) ×5 IMPLANT
PAD PREP 24X41 OB/GYN DISP (PERSONAL CARE ITEMS) ×5 IMPLANT
PENCIL ELECTRO HAND CTR (MISCELLANEOUS) ×5 IMPLANT
PLATFORM STD W/COL CELL SVR (ENDOMECHANICALS) ×3 IMPLANT
PORT ACCESS TROCAR AIRSEAL 12 (TROCAR) ×3 IMPLANT
PORT ACCESS TROCAR AIRSEAL 5M (TROCAR) ×2
RELOAD STAPLER 3.5X45 BLU DVNC (STAPLE) IMPLANT
RELOAD STAPLER 3.5X60 BLU DVNC (STAPLE) IMPLANT
RETRACT II ENDO 10MM 32CML (ENDOMECHANICALS) ×5
RETRACTOR II ENDO 10MM 32CML (ENDOMECHANICALS) ×3 IMPLANT
RETRACTOR WOUND ALXS 18CM MED (MISCELLANEOUS) IMPLANT
RTRCTR WOUND ALEXIS O 18CM MED (MISCELLANEOUS)
SCISSORS METZENBAUM CVD 33 (INSTRUMENTS) IMPLANT
SEAL CANN UNIV 5-8 DVNC XI (MISCELLANEOUS) ×12 IMPLANT
SEAL XI 5MM-8MM UNIVERSAL (MISCELLANEOUS) ×8
SEALER VESSEL DA VINCI XI (MISCELLANEOUS)
SEALER VESSEL EXT DVNC XI (MISCELLANEOUS) IMPLANT
SET CYSTO W/LG BORE CLAMP LF (SET/KITS/TRAYS/PACK) ×5 IMPLANT
SOL PREP PVP 2OZ (MISCELLANEOUS) ×5
SOLUTION ELECTROLUBE (MISCELLANEOUS) ×5 IMPLANT
SOLUTION PREP PVP 2OZ (MISCELLANEOUS) ×3 IMPLANT
SPONGE LAP 18X18 RF (DISPOSABLE) ×5 IMPLANT
STAPLER 45 DA VINCI SURE FORM (STAPLE)
STAPLER 45 SUREFORM DVNC (STAPLE) IMPLANT
STAPLER 60 DA VINCI SURE FORM (STAPLE)
STAPLER 60 SUREFORM DVNC (STAPLE) IMPLANT
STAPLER CANNULA SEAL DVNC XI (STAPLE) ×3 IMPLANT
STAPLER CANNULA SEAL XI (STAPLE) ×2
STAPLER RELOAD 3.5X45 BLU DVNC (STAPLE)
STAPLER RELOAD 3.5X45 BLUE (STAPLE)
STAPLER RELOAD 3.5X60 BLU DVNC (STAPLE)
STAPLER RELOAD 3.5X60 BLUE (STAPLE)
STAPLER SKIN PROX 35W (STAPLE) IMPLANT
SURGILUBE 2OZ TUBE FLIPTOP (MISCELLANEOUS) ×5 IMPLANT
SUT DVC VLOC 90 3-0 CV23 VLT (SUTURE) ×5
SUT ETHILON 3-0 FS-10 30 BLK (SUTURE) ×5
SUT MNCRL 4-0 (SUTURE) ×4
SUT MNCRL 4-0 27XMFL (SUTURE) ×6
SUT V-LOC 90 ABS 3-0 VLT  V-20 (SUTURE)
SUT V-LOC 90 ABS 3-0 VLT V-20 (SUTURE) IMPLANT
SUT VIC AB 0 CT1 36 (SUTURE) ×5 IMPLANT
SUT VIC AB 3-0 SH 27 (SUTURE) ×4
SUT VIC AB 3-0 SH 27X BRD (SUTURE) ×6 IMPLANT
SUTURE DVC VLC 90 3-0 CV23 VLT (SUTURE) ×3 IMPLANT
SUTURE EHLN 3-0 FS-10 30 BLK (SUTURE) ×3 IMPLANT
SUTURE MNCRL 4-0 27XMF (SUTURE) ×6 IMPLANT
TRAY FOLEY MTR SLVR 16FR STAT (SET/KITS/TRAYS/PACK) ×5 IMPLANT
TROCAR 5M 150ML BLDLS (TROCAR) ×5 IMPLANT
TROCAR Z-THREAD FIOS 11X100 BL (TROCAR) ×5 IMPLANT
TROCAR Z-THREAD OPTICAL 5X100M (TROCAR) IMPLANT
TUBING EVAC SMOKE HEATED PNEUM (TUBING) ×5 IMPLANT

## 2019-11-18 NOTE — Consult Note (Signed)
Name: Amanda Reyes MRN: [id number] DOB: 12-19-1985     CONSULTATION DATE: 11/09/2019  REFERRING MD :  Luther Bradley  CHIEF COMPLAINT:post op resp failure  HISTORY OF PRESENT ILLNESS:   34 yo morbidly obese AAF with complicated case of acute diverticulitis Patient had per drains placed last week, symptoms of fevers and abd pain persisted  Patient underwent robotic surgery to day with pus pockets and lysis of adhesions, there was NO overt perforation of fecal matter intraperitoneal  UNBALE TO WEAN FROM VENT DUE TO MORBID OBESITY, SEVERE METABOLIC ACIDOSIS, HYPOXIC RESP FAILURE  PCCM CONTACTED FOR CONSULTATION for post op resp failure and acidosis   PAST MEDICAL HISTORY :   has a past medical history of Hypertension.  has a past surgical history that includes ERCP (N/A, 08/02/2012); sphincterotomy (N/A, 08/02/2012); Stone extraction with basket (N/A, 08/02/2012); pancreatic stent placement (N/A, 08/02/2012); and Cholecystectomy (N/A, 08/08/2012). Prior to Admission medications   Medication Sig Start Date End Date Taking? Authorizing Provider  losartan-hydrochlorothiazide (HYZAAR) 100-25 MG tablet Take 1 tablet by mouth daily. 03/26/19 03/25/20 Yes Merlyn Lot, MD  azithromycin (ZITHROMAX Z-PAK) 250 MG tablet Take 2 tablets (500 mg) on  Day 1,  followed by 1 tablet (250 mg) once daily on Days 2 through 5. Patient not taking: Reported on 11/09/2019 12/06/17   Letitia Neri L, PA-C  ondansetron (ZOFRAN) 4 MG tablet Take 1 tablet (4 mg total) by mouth daily as needed. Patient not taking: Reported on 11/09/2019 03/26/19 03/25/20  Merlyn Lot, MD   Allergies  Allergen Reactions   Penicillins Hives    Did it involve swelling of the face/tongue/throat, SOB, or low BP? No Did it involve sudden or severe rash/hives, skin peeling, or any reaction on the inside of your mouth or nose? Yes Did you need to seek medical attention at a hospital or doctor's office? No When did it last  happen? If all above answers are NO, may proceed with cephalosporin use..3   Tomato Rash    FAMILY HISTORY:  family history is not on file. SOCIAL HISTORY:  reports that she has never smoked. She has never used smokeless tobacco. She reports that she does not drink alcohol and does not use drugs.  REVIEW OF SYSTEMS:   Unable to obtain due to critical illness      Estimated body mass index is 53.16 kg/m as calculated from the following:   Height as of this encounter: '5\' 9"'  (1.753 m).   Weight as of this encounter: 163.3 kg.    VITAL SIGNS: Temp:  [97.8 F (36.6 C)-103.2 F (39.6 C)] 97.8 F (36.6 C) (09/08 1110) Pulse Rate:  [109-139] 110 (09/08 1110) Resp:  [20-22] 22 (09/08 1110) BP: (92-161)/(47-98) 106/48 (09/08 1110) SpO2:  [93 %-100 %] 100 % (09/08 1110)   I/O last 3 completed shifts: In: 2625.9 [P.O.:240; IV Piggyback:2385.9] Out: 555 [Urine:525; Drains:30] Total I/O In: 6295 [I.V.:5000; Blood:400; IV Piggyback:250] Out: 2841 [Urine:670; Drains:20; Other:700]   SpO2: 100 % O2 Flow Rate (L/min): 2 L/min   Physical Examination:  GENERAL:critically ill appearing, +resp distress HEAD: Normocephalic, atraumatic.  EYES: Pupils equal, round, reactive to light.  No scleral icterus.  MOUTH: Moist mucosal membrane. NECK: Supple. No JVD.  PULMONARY: +rhonchi, +wheezing CARDIOVASCULAR: S1 and S2. Regular rate and rhythm. No murmurs, rubs, or gallops.  GASTROINTESTINAL: Soft, nontender, +distended.  Positive bowel sounds.  MUSCULOSKELETAL: No swelling, clubbing, or edema.  NEUROLOGIC: obtunded SKIN:intact,warm,dry   MEDICATIONS: I have reviewed all medications and confirmed regimen  as documented   CULTURE RESULTS   Recent Results (from the past 240 hour(s))  Urine culture     Status: None   Collection Time: 11/09/19  4:59 PM   Specimen: Urine, Random  Result Value Ref Range Status   Specimen Description   Final    URINE, RANDOM Performed at  [facility], [address] [place], [place] [address]    Special Requests   Final    NONE Performed at [facility], [address] [place], [place] [address]    Culture   Final    NO GROWTH Performed at [facility], [place] [address], [place], [place] [address]    Report Status 11/10/2019 FINAL  Final  Blood culture (routine x 2)     Status: None   Collection Time: 11/09/19  5:58 PM   Specimen: BLOOD  Result Value Ref Range Status   Specimen Description BLOOD BLOOD RIGHT HAND  Final   Special Requests   Final    BOTTLES DRAWN AEROBIC AND ANAEROBIC Blood Culture results may not be optimal due to an inadequate volume of blood received in culture bottles   Culture   Final    NO GROWTH 5 DAYS Performed at [facility], [address] [place], [place] [address]    Report Status 11/14/2019 FINAL  Final  Blood culture (routine x 2)     Status: None   Collection Time: 11/09/19  5:58 PM   Specimen: BLOOD  Result Value Ref Range Status   Specimen Description BLOOD RIGHT ANTECUBITAL  Final   Special Requests   Final    BOTTLES DRAWN AEROBIC AND ANAEROBIC Blood Culture adequate volume   Culture   Final    NO GROWTH 5 DAYS Performed at [facility], [address] [place], [place] [address]    Report Status 11/14/2019 FINAL  Final  SARS Coronavirus 2 by RT PCR (hospital order, performed in [facility] hospital lab) Nasopharyngeal Nasopharyngeal Swab     Status: None   Collection Time: 11/09/19  5:58 PM   Specimen: Nasopharyngeal Swab  Result Value Ref Range Status   SARS Coronavirus 2 NEGATIVE NEGATIVE Final    Comment: (NOTE) SARS-CoV-2 target nucleic acids are NOT DETECTED.  The SARS-CoV-2 RNA is generally detectable in upper and lower respiratory specimens during the acute phase of infection. The lowest concentration of SARS-CoV-2 viral copies this assay can detect is 250 copies / mL. A negative result does not preclude SARS-CoV-2  infection and should not be used as the sole basis for treatment or other patient management decisions.  A negative result may occur with improper specimen collection / handling, submission of specimen other than nasopharyngeal swab, presence of viral mutation(s) within the areas targeted by this assay, and inadequate number of viral copies (<250 copies / mL). A negative result must be combined with clinical observations, patient history, and epidemiological information.  Fact Sheet for Patients:   StrictlyIdeas.no  Fact Sheet for Healthcare Providers: BankingDealers.co.za  This test is not yet approved or  cleared by the [place] FDA and has been authorized for detection and/or diagnosis of SARS-CoV-2 by FDA under an Emergency Use Authorization (EUA).  This EUA will remain in effect (meaning this test can be used) for the duration of the COVID-19 declaration under Section 564(b)(1) of the Act, 21 U.S.C. section 360bbb-3(b)(1), unless the authorization is terminated or revoked sooner.  Performed at [facility], [place], [place]  [address]   Aerobic/Anaerobic Culture (surgical/deep wound)     Status: None (Preliminary result)   Collection Time: 11/13/19  6:52 PM   Specimen: Abscess  Result Value Ref Range Status   Specimen Description   Final    ABSCESS Performed at [facility], [address], [place], [place] [address]    Special Requests   Final    Normal Performed at [facility], [address] [place], [place] [address]    Gram Stain   Final    MODERATE WBC PRESENT, PREDOMINANTLY PMN ABUNDANT GRAM NEGATIVE RODS ABUNDANT GRAM POSITIVE COCCI IN PAIRS IN CHAINS FEW GRAM POSITIVE RODS Performed at [facility], [place] [address], [place], [place] [address]    Culture   Final    ABUNDANT ESCHERICHIA COLI STREPTOCOCCUS GROUP C Beta hemolytic streptococci are predictably  susceptible to penicillin and other beta lactams. Susceptibility testing not routinely performed. NO ANAEROBES ISOLATED; CULTURE IN PROGRESS FOR 5 DAYS    Report Status PENDING  Incomplete   Organism ID, Bacteria ESCHERICHIA COLI  Final      Susceptibility   Escherichia coli - MIC*    AMPICILLIN <=2 SENSITIVE Sensitive     CEFAZOLIN <=4 SENSITIVE Sensitive     CEFEPIME <=0.12 SENSITIVE Sensitive     CEFTAZIDIME <=1 SENSITIVE Sensitive     CEFTRIAXONE <=0.25 SENSITIVE Sensitive     CIPROFLOXACIN <=0.25 SENSITIVE Sensitive     GENTAMICIN <=1 SENSITIVE Sensitive     IMIPENEM <=0.25 SENSITIVE Sensitive     TRIMETH/SULFA <=20 SENSITIVE Sensitive     AMPICILLIN/SULBACTAM <=2 SENSITIVE Sensitive     PIP/TAZO <=4 SENSITIVE Sensitive     * ABUNDANT ESCHERICHIA COLI        CBC    Component Value Date/Time   WBC 20.0 (H) 11/18/2019 0433   RBC 3.56 (L) 11/18/2019 0433   HGB 8.7 (L) 11/18/2019 1405   HCT 26.5 (L) 11/18/2019 1405   PLT 397 11/18/2019 0433   MCV 74.4 (L) 11/18/2019 0433   MCH 23.6 (L) 11/18/2019 0433   MCHC 31.7 11/18/2019 0433   RDW 18.6 (H) 11/18/2019 0433   LYMPHSABS 3.2 08/14/2011 2329   MONOABS 0.4 08/14/2011 2329   EOSABS 0.1 08/14/2011 2329   BASOSABS 0.0 08/14/2011 2329   BMP Latest Ref Rng & Units 11/18/2019 11/17/2019 11/16/2019  Glucose 70 - 99 mg/dL 103(H) 108(H) 114(H)  BUN 6 - 20 mg/dL 5(L) 6 8  Creatinine 0.44 - 1.00 mg/dL 0.93 0.80 0.76  Sodium 135 - 145 mmol/L 137 138 139  Potassium 3.5 - 5.1 mmol/L 3.5 3.5 3.5  Chloride 98 - 111 mmol/L 109 106 108  CO2 22 - 32 mmol/L '24 22 24  ' Calcium 8.9 - 10.3 mg/dL 7.4(L) 7.3(L) 7.5(L)      IMAGING    No results found.   Nutrition Status: Nutrition Problem: Inadequate oral intake Etiology: inability to eat Signs/Symptoms: NPO status Interventions: TPN     Indwelling Urinary Catheter continued, requirement due to   Reason to continue Indwelling Urinary Catheter strict Intake/Output monitoring for  hemodynamic instability         Ventilator continued, requirement due to severe respiratory failure   Ventilator Sedation RASS 0 to -2      ASSESSMENT AND PLAN SYNOPSIS   Severe ACUTE Hypoxic and Hypercapnic Respiratory Failure with post op resp failure due to acute metabolic acidosis due to acute diverticulitis with sepsis(elevated WBC and elevated HR 110)  -continue Full MV support -continue Bronchodilator  Therapy -Wean Fio2 and PEEP as tolerated -will perform SAT/SBT when respiratory parameters are met -VAP/VENT bundle implementation  Morbid obesity, possible OSA.   Will certainly impact respiratory mechanics, ventilator weaning Suspect will need to consider additional PEEP, possible extubation to BiPAP when appropriate to consider     NEUROLOGY - intubated and sedated - minimal sedation to achieve a RASS goal: -1 Wake up assessment pending  SHOCK-SEPSIS -use vasopressors to keep MAP>65 if needed -follow ABG and LA -follow up cultures -emperic ABX -aggressive IV fluid resuscitation  CARDIAC ICU monitoring  ID -continue IV abx as prescibed -follow up cultures  GI S/p acute diverticulitis s/p robotics surgery for lysis of adhesiones and pus pockets  GI PROPHYLAXIS as indicated  NUTRITIONAL STATUS DIET-->as per surgery Constipation protocol as indicated   ENDO - will use ICU hypoglycemic\Hyperglycemia protocol if needed    ELECTROLYTES -follow labs as needed -replace as needed -pharmacy consultation and following    DVT/GI PRX ordered and assessed TRANSFUSIONS AS NEEDED MONITOR FSBS I Assessed the need for Labs I Assessed the need for Foley I Assessed the need for Central Venous Line Family Discussion when available I Assessed the need for Mobilization I made an Assessment of medications to be adjusted accordingly Safety Risk assessment Completed   Critical Care Time devoted to patient care services described in this note is 65  minutes.   Overall, patient is critically ill, prognosis is guarded.     Corrin Parker, M.D.  [facility]  Medical Director [place] Director [facility] Cardio-Pulmonary Department

## 2019-11-18 NOTE — Progress Notes (Signed)
Spoke to primary RN to assess if patient was stable enough to place PICC tonight. Patient just received to floor from PACU. Staff currently stabilizing patient and getting patient comfortable at shift change. IV Team will follow up later tonight or on 9-9 AM.

## 2019-11-18 NOTE — Progress Notes (Signed)
[facility] SURGICAL PROGRESS NOTE  Hospital Day(s): 9.   Interval History:  Patient seen and examined She continues to have intermittent fevers (T-Max 103.2) and tachycardia overnight, fever improved with tylenol, otherwise no changes in examination/condition   Patient reports she continues to have lower abdominal pain, relatively unchanged from prior examinations Leukocytosis is again worsening, now 20.0K Renal function normal, sCr - 0.93, UO is mostly unrecorded No electrolyte derangements  Plan for surgical intervention this afternoon with Dr Claudine Mouton given her lack of clinical improvement.    Vital signs in last 24 hours: [min-max] current  Temp:  [98.2 F (36.8 C)-103.2 F (39.6 C)] 98.8 F (37.1 C) (09/08 0614) Pulse Rate:  [110-139] 110 (09/08 0614) Resp:  [20-28] 22 (09/08 0432) BP: (92-161)/(47-98) 109/48 (09/08 0614) SpO2:  [93 %-100 %] 97 % (09/08 0614)     Height: 5\' 9"  (175.3 cm) Weight: (!) 163.3 kg BMI (Calculated): 53.14   Intake/Output last 2 shifts:  09/07 0701 - 09/08 0700 In: 2625.9 [P.O.:240; IV Piggyback:2385.9] Out: 300 [Urine:300]   Physical Exam:  Constitutional: alert, cooperative and no distress  HENT: normocephalic without obvious abnormality  Eyes: PERRL, EOM's grossly intact and symmetric  Respiratory: breathing non-labored at rest  Cardiovascular: Tachycardic and sinus rhythm Gastrointestinal: Morbidly obese, Soft, she is tender centrally and in her suprapubic region, distension is difficult to assess secondary to habitus, I do not appreciate any rebound/peritonitis. Percutaneous drain in right back with more serous appearing output  Musculoskeletal: no edema or wounds, motor and sensation grossly intact, NT    Labs:  CBC Latest Ref Rng & Units 11/17/2019 11/16/2019 11/15/2019  WBC 4.0 - 10.5 K/uL 16.1(H) 13.8(H) 16.4(H)  Hemoglobin 12.0 - 15.0 g/dL 01/15/2020) 6.2(I) 9.4(W)  Hematocrit 36 - 46 % 26.8(L) 26.2(L) 25.8(L)  Platelets  150 - 400 K/uL 380 338 320   CMP Latest Ref Rng & Units 11/18/2019 11/17/2019 11/16/2019  Glucose 70 - 99 mg/dL 01/16/2020) 270(J) 500(X)  BUN 6 - 20 mg/dL 5(L) 6 8  Creatinine 381(W - 1.00 mg/dL 2.99 3.71 6.96  Sodium 135 - 145 mmol/L 137 138 139  Potassium 3.5 - 5.1 mmol/L 3.5 3.5 3.5  Chloride 98 - 111 mmol/L 109 106 108  CO2 22 - 32 mmol/L 24 22 24   Calcium 8.9 - 10.3 mg/dL 7.4(L) 7.3(L) 7.5(L)  Total Protein 6.5 - 8.1 g/dL - - 5.9(L)  Total Bilirubin 0.3 - 1.2 mg/dL - - 0.5  Alkaline Phos 38 - 126 U/L - - 71  AST 15 - 41 U/L - - 13(L)  ALT 0 - 44 U/L - - 12    Imaging studies: No new pertinent imaging studies   Assessment/Plan: 34 y.o. female with lack of clinical improvement, persistent tachycardia, and fevers despite medical management admitted withcomplicated sigmoid diverticulitis with likely micro-perforation and eventual development of abscess s/p percutaneous drainage on 09/03, complicated by pertinent comorbidities includingmorbid obesity   - We will plan for surgical intervention (Possible robotic assisted sigmoid colectomy, possible exploratory laparotomy, possible colostomy/Hartman's, possible ileostomy) this afternoon with Dr 32, MD pending OR/Anesthesia availability.   - All risks, benefits, and alternatives to above procedure(s) were discussed with the patient, all of her questions were answered to her expressed satisfaction, patient expresses she wishes to proceed, and informed consent was obtained.    - NPO for surgery; TPN initiated yesterday; advance to goal             - IVF Resuscitation              -  IV ABx (Cipro + Flagyl); Cx from drainage still pending final growth  - Monitor abdominal examination; on-going bowel function - Pain control prn; antiemetics prn - Monitor leukocytosis, renal function - Home medications - Mobilization encouraged as tolerated - DVT prophylaxis; held for  surgery  All of the above findings and recommendations were discussed with the patient, and the medical team, and all of patient's questions were answered to her expressed satisfaction.  -- Lynden Oxford, PA-C [facility] 11/18/2019, 7:46 AM [phone] M-F: 7am - 4pm

## 2019-11-18 NOTE — Progress Notes (Signed)
Report received from OR RN at 1830 hrs. Report given to Edilia Bo., RN at 1900 hrs.

## 2019-11-18 NOTE — Progress Notes (Signed)
Subjective:  CC: Amanda Reyes is a 34 y.o. female  Hospital stay day 9,   diverticulitis  HPI: Called to bedside by RN for interval increase in HR from baseline 120s to episodes of 150s with recorded temp of 103.2.  Tylenol given but seems to not break fever.  Pt states she feels the same as she has been past few days.  Persistent complaint of pressure like sensation in abdomen, but otherwise denies chest pain, SOB.  ROS:  General: Denies weight loss, weight gain, fatigue, fevers, chills, and night sweats. Heart: Denies chest pain, palpitations, racing heart, irregular heartbeat, leg pain or swelling, and decreased activity tolerance. Respiratory: Denies breathing difficulty, shortness of breath, wheezing, cough, and sputum. GI: Denies change in appetite, heartburn, nausea, vomiting, constipation, diarrhea, and blood in stool. GU: Denies difficulty urinating, pain with urinating, urgency, frequency, blood in urine.   Objective:   Temp:  [98.2 F (36.8 C)-103.2 F (39.6 C)] 103.2 F (39.6 C) (09/08 0136) Pulse Rate:  [113-139] 132 (09/08 0136) Resp:  [20-28] 22 (09/08 0136) BP: (109-161)/(66-99) 123/66 (09/08 0136) SpO2:  [93 %-100 %] 98 % (09/08 0136)     Height: 5\' 9"  (175.3 cm) Weight: (!) 163.3 kg BMI (Calculated): 53.14   Intake/Output this shift:   Intake/Output Summary (Last 24 hours) at 11/18/2019 0140 Last data filed at 11/17/2019 0720 Gross per 24 hour  Intake --  Output 300 ml  Net -300 ml    Constitutional :  alert, cooperative, appears stated age and no distress  Respiratory:  clear to auscultation bilaterally  Cardiovascular:  tachy rate  Gastrointestinal: soft, TTP noted in all quadrants.  impossible to assess for guarding. Drain with serous discharge.  Skin: Cool and moist.   Psychiatric: Normal affect, non-agitated, not confused       LABS:  CMP Latest Ref Rng & Units 11/17/2019 11/16/2019 11/15/2019  Glucose 70 - 99 mg/dL 01/15/2020) 166(A) 630(Z)  BUN 6 - 20  mg/dL 6 8 12   Creatinine 0.44 - 1.00 mg/dL 601(U 9.32  Sodium 135 - 145 mmol/L 138 139 140  Potassium 3.5 - 5.1 mmol/L 3.5 3.5 3.9  Chloride 98 - 111 mmol/L 106 108 110  CO2 22 - 32 mmol/L 22 24 25   Calcium 8.9 - 10.3 mg/dL 7.3(L) 7.5(L) 7.7(L)  Total Protein 6.5 - 8.1 g/dL - 5.9(L) -  Total Bilirubin 0.3 - 1.2 mg/dL - 0.5 -  Alkaline Phos 38 - 126 U/L - 71 -  AST 15 - 41 U/L - 13(L) -  ALT 0 - 44 U/L - 12 -   CBC Latest Ref Rng & Units 11/17/2019 11/16/2019 11/15/2019  WBC 4.0 - 10.5 K/uL 16.1(H) 13.8(H) 16.4(H)  Hemoglobin 12.0 - 15.0 g/dL 01/17/2020) 01/16/2020) 01/15/2020)  Hematocrit 36 - 46 % 26.8(L) 26.2(L) 25.8(L)  Platelets 150 - 400 K/uL 380 338 320    RADS: n/a Assessment:   Diverticulitis, planned for ex-lap, possible hartman's in am.  Notified of increasing HR and fever.  Pt reports feeling no different, confirmed on chart check her baseline vitals beening persistently tachcardic in 120s along with increased RR in low 20s as well.  EKG showing sinus tach  Will just continue to monitor and see if we can break fever with motrin to bring HR down back to baseline.  If remains in higher than 150s, may need to consider beta-blocker.  No indication for emergent overnight surgical intervention at this time.

## 2019-11-18 NOTE — Progress Notes (Signed)
   11/18/19 0041  Assess: if the MEWS score is Yellow or Red  Were vital signs taken at a resting state? Yes  Focused Assessment No change from prior assessment  Early Detection of Sepsis Score *See Row Information* High  MEWS guidelines implemented *See Row Information* Yes  Treat  MEWS Interventions Administered prn meds/treatments (Tylenol PRN)  Take Vital Signs  Increase Vital Sign Frequency  Red: Q 1hr X 4 then Q 4hr X 4, if remains red, continue Q 4hrs  Escalate  MEWS: Escalate Red: discuss with charge nurse/RN and provider, consider discussing with RRT  Notify: Charge Nurse/RN  Name of Charge Nurse/RN Notified Melissa, RN  Date Charge Nurse/RN Notified 11/18/19  Time Charge Nurse/RN Notified 0040  Notify: Provider  Provider Name/Title Dr. Tonna Boehringer  Date Provider Notified 11/18/19  Time Provider Notified 0111  Notification Type  (Secure chat then called)  Notification Reason Change in status  Response See new orders  Date of Provider Response 11/18/19  Time of Provider Response 0111   MD notified of HR, Temp, & RR. EKG ordered. Will continue to monitor.

## 2019-11-18 NOTE — Op Note (Signed)
Robotic lysis of adhesions with irrigation and debridement of multiple intra-abdominal/peritoneal/pelvic abscesses with drain placement.  Pre-operative Diagnosis: Complicated diverticulitis with multiple peritoneal and pelvic abscesses.  Post-operative Diagnosis: same.    Surgeon: Campbell Lerner, M.D., FACS  Anesthesia: General  Findings: As expected, multiple well loculated abscess cavities with luminal coating of fibrinous exudates and biofilm.  No evidence of fistulous process, no feculent spillage present.  Estimated Blood Loss: 300 mL         Specimens: None          Complications: none              Procedure Details  The patient was seen again in the Holding Room. The benefits, complications, treatment options, and expected outcomes were discussed with the patient. The risks of bleeding, infection, recurrence of symptoms, failure to resolve symptoms, unanticipated injury, prosthetic placement, prosthetic infection, any of which could require further surgery were reviewed with the patient. The likelihood of improving the patient's symptoms with return to their baseline status is guarded.  The patient and/or family concurred with the proposed plan, giving informed consent.  The patient was taken to Operating Room, identified and the procedure verified.    Prior to the induction of general anesthesia, antibiotic prophylaxis was administered. VTE prophylaxis was in place.  General anesthesia was then administered and tolerated well. After the induction, the patient was positioned in the lithotomy position and the urological procedure by Dr. Apolinar Junes at my request was completed.  I asked her to instill ICG into both ureters for better identification should we have questions of their location during this pelvic procedure.  She was subsequently repositioned in a supine position her abdomen was prepped with Chloraprep and draped in the sterile fashion.  A Time Out was held and the above  information confirmed. I attempted to place a Veress needle in the left upper quadrant, but was unable to get saline drop test to confirm its location.  I then proceeded with creating an incision in the left upper quadrant, and then utilizing direct visualization I passed an optical 28mm trocar into the peritoneal cavity.  I then proceeded with insufflation utilizing the air seal. Under direct visualization three 8.5 mm robotic trochars were placed across the upper abdomen and right upper quadrant.  This was all somewhat challenging considering the very small space in which we had to work and visualize this process.  In so doing we lysed adhesions to the right abdominal wall, draining one of her many abscesses. With 3 robotic ports in we proceeded with docking the robot anticipating pelvic surgery.  Utilizing the robotic suction irrigator in the right hand and force bipolar in the left.  Proceeded with lysing anterior abdominal wall adhesions.  We aspirated the abscess in the right side previously noted.  Every abscess we encountered we proceeded with debridement of the fibrinous exudate lining the cavity.  We then proceeded to the next set of adhesions and bluntly taking these down with minimal effort lysed multiple cavities and suction aspirated then debriding each. This process was continued from cephalad to caudad, from left to right, progressing until we reached pelvic structures.  Identified the fundus of the uterus, and the fallopian tubes.  Again multiple cavities were drained, debrided. Overall this process in sequential manner took probably 4 hours.  When I finally felt that every potential recess had been evaluated and every potential abscess imaginable could be drained, I found no evidence of enteric or colonic fistula. Considering the  patient's morbidity with any type of stoma placement I elected to not create a stoma either colonic or a loop ileostomy.  I felt this would only add potential  morbidity to her procedure and obviously require her to have this addressed at some future date, and I do not believe not performing this would pose any additional risk at not resolving her sepsis. Considering this I subsequently placed a 19 Blake drain through a right lower abdominal wall 5 mm trocar site placed just for the sake of passing the drain into the lower abdomen.  Was secured at the skin with 3-0 nylon.  At full length this drain looped from the right pelvis to the left pelvis. I irrigated nearly 6 L of normal saline solution in sequential manner to fully irrigate and dilute the contamination present. We subsequently withdrew our robotic trochars and the air seal port.  The air seal port site fascia was then closed with a figure-of-eight of 0 Vicryl under direct visualization. Then proceeded with closing the skin with 3-0 Vicryl and interrupted subcuticulars of 4-0 Monocryl.  Skin was sealed with Dermabond.  Her ABG at completion of the procedure shows a significant metabolic acidosis for which she will remain intubated.  She has an anticipated postoperative ileus and her NG tube remains functional and appears to be draining the stomach well.  This patient was reported to Dr. Belia Heman of ICU.     Campbell Lerner M.D., [facility] 11/18/2019 5:15 PM

## 2019-11-18 NOTE — Progress Notes (Signed)
   11/18/19 0432 11/18/19 0450  Assess: MEWS Score  Temp 99.1 F (37.3 C)  --   BP (!) 92/47  --   Pulse Rate (!) 121  --   Resp (!) 22  --   SpO2 95 %  --   O2 Device Room Air  --   Assess: MEWS Score  MEWS Temp 0  --   MEWS Systolic 1  --   MEWS Pulse 2  --   MEWS RR 1  --   MEWS LOC 0  --   MEWS Score 4  --   MEWS Score Color Red  --   Assess: if the MEWS score is Yellow or Red  Were vital signs taken at a resting state?  --  Yes  Focused Assessment  --  No change from prior assessment  Early Detection of Sepsis Score *See Row Information*  --  High  MEWS guidelines implemented *See Row Information*  --  No, previously red, continue vital signs every 4 hours  Treat  MEWS Interventions  --  Administered scheduled meds/treatments (1000 ns bolus)  Escalate  MEWS: Escalate  --  Red: discuss with charge nurse/RN and provider, consider discussing with RRT  Notify: Provider  Provider Name/Title  --  Dr. Tonna Boehringer  Date Provider Notified  --  11/18/19  Time Provider Notified  --  [phone]  Notification Type  --  Call  Notification Reason  --  Change in status (Hypotensive)  Response  --  See new orders  Date of Provider Response  --  11/18/19  Time of Provider Response  --  [phone]  Document  Patient Outcome  --  Other (Comment) (Continue monitoring)   MD notified of patient being hypotensive. NS bolus ordered. Will recheck BP when bolus is completed.

## 2019-11-18 NOTE — Consult Note (Signed)
PHARMACY - TOTAL PARENTERAL NUTRITION CONSULT NOTE   Indication: Prolonged ileus  Patient Measurements: Height: 5\' 9"  (175.3 cm) Weight: (!) 163.3 kg (360 lb) IBW/kg (Calculated) : 66.2 TPN AdjBW (KG): 90.5 Body mass index is 53.16 kg/m.  Assessment: 34 year old female with PMHx of HTN admitted with perforated diverticulitis with contained abscesses.   -Per surgery note 9/7 there was evidence of worsening diverticulitis with 3 separate collections. Plan is for robotic exploration and possibly Hartman's depending on operative findings. Patient was made NPO and PICC was placed for TPN.  Glucose / Insulin: BG range 84 - 112: no SSI required  Electrolytes: WNL Renal: Scr 0.76-->0.93 LFTs / TGs: LFTs wnl at baseline, TG 69 at baseline Prealbumin / albumin: pending Intake / Output; MIVF:  GI Imaging: Surgeries / Procedures:   Central access: 11/17/19 TPN start date: 11/18/19  Nutritional Goals (per RD recommendation on 11/17/19): kCal: 2600 - 2800/day, Protein: 130 - 140g/day, Fluid: > 2000 mL Goal TPN rate is 100 mL/hr (provides 140 g of protein and 2800 kcals per day)  Current Nutrition:  NPO  Plan:   Start TPN at 46mL/hr at 1800  Nutritional components: 15.6 % dextrose, 53 g/L amino acids, 31 g/L lipids  Electrolytes in TPN: 93mEq/L of Na, 13mEq/L of K, 42mEq/L of Ca, 39mEq/L of Mg, and 51mmol/L of Phos. Cl:Ac ratio 1:1  Add standard MVI and trace elements to TPN  continue Moderate q6h SSI and adjust as needed   Reduce MIVF to 95 mL/hr at 1800  Monitor TPN labs daily until stable then on Mon/Thurs  12m 11/18/2019,6:56 AM

## 2019-11-18 NOTE — Transfer of Care (Signed)
Immediate Anesthesia Transfer of Care Note  Patient: Amanda Reyes  Procedure(s) Performed: CYSTOSCOPY INDOCYANINE GREEN FLUORESCENCE IMAGING (ICG) (Bilateral Ureter) XI ROBOTIC ASSISTED LAPAROSCOPIC LYSIS OF ADHESION; IRRIGATION AND DEBRIDEMENT ABDOMEN; INTRA-ABDOMINAL/PELVIC ABCESSES AND DRAIN PLACEMENT.  Patient Location: PACU and ICU  Anesthesia Type:General  Level of Consciousness: sedated and Patient remains intubated per anesthesia plan  Airway & Oxygen Therapy: Patient placed on Ventilator (see vital sign flow sheet for setting)  Post-op Assessment: Report given to RN and Post -op Vital signs reviewed and stable  Post vital signs: Reviewed and stable  Last Vitals:  Vitals Value Taken Time  BP 175/88 11/18/19 1845  Temp 36.8 C 11/18/19 1834  Pulse 127 11/18/19 1846  Resp 16 11/18/19 1846  SpO2 92 % 11/18/19 1846  Vitals shown include unvalidated device data.  Last Pain:  Vitals:   11/18/19 1834  TempSrc: Axillary  PainSc:       Patients Stated Pain Goal: 0 (11/17/19 1936)  Complications: No complications documented.

## 2019-11-18 NOTE — Progress Notes (Signed)
Mobility Specialist - Progress Note   11/18/19 1159  Mobility  Activity Refused mobility (Cancelled)  Mobility performed by Mobility specialist    Pt currently off of floor undergoing procedure. Will attempt session when pt is readily available/appropriate.    Filiberto Pinks Mobility Specialist 11/18/19, 12:01 PM

## 2019-11-18 NOTE — Anesthesia Procedure Notes (Signed)
Arterial Line Insertion Start/End9/10/2019 12:30 PM, 11/18/2019 12:36 PM Performed by: Alver Fisher, MD, anesthesiologist  Preanesthetic checklist: patient identified, IV checked, site marked, risks and benefits discussed, surgical consent, monitors and equipment checked, pre-op evaluation, timeout performed and anesthesia consent radial was placed Catheter size: 20 G Hand hygiene performed  and Seldinger technique used Allen's test indicative of satisfactory collateral circulation Attempts: 1 Procedure performed using ultrasound guided technique. Ultrasound Notes:anatomy identified, needle tip was noted to be adjacent to the nerve/plexus identified and no ultrasound evidence of intravascular and/or intraneural injection Following insertion, Biopatch and dressing applied. Post procedure assessment: normal  Patient tolerated the procedure well with no immediate complications.

## 2019-11-18 NOTE — Anesthesia Procedure Notes (Signed)
Procedure Name: Intubation Performed by: Darliss Cheney, CRNA Pre-anesthesia Checklist: Patient identified, Emergency Drugs available, Suction available and Patient being monitored Patient Re-evaluated:Patient Re-evaluated prior to induction Oxygen Delivery Method: Circle system utilized Preoxygenation: Pre-oxygenation with 100% oxygen Induction Type: IV induction Laryngoscope Size: Glidescope and 3 Grade View: Grade I Tube type: Oral Tube size: 7.5 mm Number of attempts: 1 Airway Equipment and Method: Stylet Placement Confirmation: ETT inserted through vocal cords under direct vision,  positive ETCO2 and breath sounds checked- equal and bilateral Secured at: 22 cm Tube secured with: Tape Dental Injury: Teeth and Oropharynx as per pre-operative assessment  Difficulty Due To: Difficulty was anticipated

## 2019-11-18 NOTE — Progress Notes (Signed)
Patient in OR/PACU. Not available for PICC placement at this time. Will place tomorrow 11-19-19.

## 2019-11-18 NOTE — Op Note (Signed)
Date of procedure: 11/18/19  Preoperative diagnosis:  1. Diverticulitis  Postoperative diagnosis:  1. Same as above  Procedure: 1. Cystoscopy 2. Bilateral retrograde pyelogram of ICG for the purpose of ureteral identification  Surgeon: Vanna Scotland, MD  Anesthesia: General  Complications: None  Intraoperative findings: Normal bladder.  ICG instilled bilaterally into both UOs.  EBL: Minimal  Specimens: None  Drains: 16 French Foley catheter  Indication: MUMTAZ LOVINS is a 34 y.o. patient with perforated diverticulitis undergoing exploratory laparotomy with Dr. Claudine Mouton.  He has requested instillation of ICG for the purpose of ureteral identification.  After reviewing the management options for treatment, he elected to proceed with the above surgical procedure(s). We have discussed the potential benefits and risks of the procedure, side effects of the proposed treatment, the likelihood of the patient achieving the goals of the procedure, and any potential problems that might occur during the procedure or recuperation. Informed consent has been obtained.  Description of procedure:  The patient was taken to the operating room and general anesthesia was induced.  The patient was placed in the dorsal lithotomy position, prepped and draped in the usual sterile fashion, and preoperative antibiotics were administered. A preoperative time-out was performed.   A 21 French scope was advanced per urethra into the bladder.  Initially, identification of the urethra was somewhat difficult due to her habitus but ultimately was located.  Attention was turned to the left ureteral orifice.  This was intubated using a 5 [place] open-ended ureteral catheter and a gentle retrograde pyelogram was performed injecting 10 cc of ICG which went easily.  Attention was then turned to the right ureteral orifice the same exact procedure was performed.  Green efflux was seen from each UO.  The scope was then  removed.  A 16 French Foley catheter was placed the balloon was filled with 10 cc of sterile water.  Remainder of the procedure was then completed by Dr. Claudine Mouton.  Please see his operative report.  Vanna Scotland, M.D.

## 2019-11-19 ENCOUNTER — Encounter: Payer: Self-pay | Admitting: Surgery

## 2019-11-19 DIAGNOSIS — D72829 Elevated white blood cell count, unspecified: Secondary | ICD-10-CM

## 2019-11-19 DIAGNOSIS — K5792 Diverticulitis of intestine, part unspecified, without perforation or abscess without bleeding: Secondary | ICD-10-CM | POA: Diagnosis not present

## 2019-11-19 DIAGNOSIS — K651 Peritoneal abscess: Secondary | ICD-10-CM | POA: Diagnosis not present

## 2019-11-19 LAB — CBC WITH DIFFERENTIAL/PLATELET
Abs Immature Granulocytes: 0.35 10*3/uL — ABNORMAL HIGH (ref 0.00–0.07)
Basophils Absolute: 0.1 10*3/uL (ref 0.0–0.1)
Basophils Relative: 1 %
Eosinophils Absolute: 0 10*3/uL (ref 0.0–0.5)
Eosinophils Relative: 0 %
HCT: 26 % — ABNORMAL LOW (ref 36.0–46.0)
Hemoglobin: 8.7 g/dL — ABNORMAL LOW (ref 12.0–15.0)
Immature Granulocytes: 2 %
Lymphocytes Relative: 6 %
Lymphs Abs: 1.1 10*3/uL (ref 0.7–4.0)
MCH: 24.7 pg — ABNORMAL LOW (ref 26.0–34.0)
MCHC: 33.5 g/dL (ref 30.0–36.0)
MCV: 73.9 fL — ABNORMAL LOW (ref 80.0–100.0)
Monocytes Absolute: 0.6 10*3/uL (ref 0.1–1.0)
Monocytes Relative: 3 %
Neutro Abs: 15.3 10*3/uL — ABNORMAL HIGH (ref 1.7–7.7)
Neutrophils Relative %: 88 %
Platelets: 347 10*3/uL (ref 150–400)
RBC: 3.52 MIL/uL — ABNORMAL LOW (ref 3.87–5.11)
RDW: 18.7 % — ABNORMAL HIGH (ref 11.5–15.5)
Smear Review: NORMAL
WBC: 17.4 10*3/uL — ABNORMAL HIGH (ref 4.0–10.5)
nRBC: 0.7 % — ABNORMAL HIGH (ref 0.0–0.2)

## 2019-11-19 LAB — BLOOD GAS, ARTERIAL
Acid-base deficit: 4 mmol/L — ABNORMAL HIGH (ref 0.0–2.0)
Bicarbonate: 20.8 mmol/L (ref 20.0–28.0)
FIO2: 0.4
MECHVT: 500 mL
Mechanical Rate: 16
O2 Saturation: 98.9 %
PEEP: 5 cmH2O
Patient temperature: 37
pCO2 arterial: 36 mmHg (ref 32.0–48.0)
pH, Arterial: 7.37 (ref 7.350–7.450)
pO2, Arterial: 133 mmHg — ABNORMAL HIGH (ref 83.0–108.0)

## 2019-11-19 LAB — AEROBIC/ANAEROBIC CULTURE W GRAM STAIN (SURGICAL/DEEP WOUND): Special Requests: NORMAL

## 2019-11-19 LAB — COMPREHENSIVE METABOLIC PANEL
ALT: 9 U/L (ref 0–44)
AST: 11 U/L — ABNORMAL LOW (ref 15–41)
Albumin: 1.7 g/dL — ABNORMAL LOW (ref 3.5–5.0)
Alkaline Phosphatase: 43 U/L (ref 38–126)
Anion gap: 10 (ref 5–15)
BUN: 9 mg/dL (ref 6–20)
CO2: 20 mmol/L — ABNORMAL LOW (ref 22–32)
Calcium: 6.6 mg/dL — ABNORMAL LOW (ref 8.9–10.3)
Chloride: 110 mmol/L (ref 98–111)
Creatinine, Ser: 0.97 mg/dL (ref 0.44–1.00)
GFR calc Af Amer: >60 mL/min (ref >60)
GFR calc non Af Amer: >60 mL/min (ref >60)
Glucose, Bld: 125 mg/dL — ABNORMAL HIGH (ref 70–99)
Potassium: 4.2 mmol/L (ref 3.5–5.1)
Sodium: 140 mmol/L (ref 135–145)
Total Bilirubin: 0.8 mg/dL (ref 0.3–1.2)
Total Protein: 5 g/dL — ABNORMAL LOW (ref 6.5–8.1)

## 2019-11-19 LAB — PREALBUMIN: Prealbumin: <5 mg/dL — ABNORMAL LOW (ref 18–38)

## 2019-11-19 LAB — GLUCOSE, CAPILLARY
Glucose-Capillary: 103 mg/dL — ABNORMAL HIGH (ref 70–99)
Glucose-Capillary: 118 mg/dL — ABNORMAL HIGH (ref 70–99)
Glucose-Capillary: 120 mg/dL — ABNORMAL HIGH (ref 70–99)
Glucose-Capillary: 121 mg/dL — ABNORMAL HIGH (ref 70–99)
Glucose-Capillary: 152 mg/dL — ABNORMAL HIGH (ref 70–99)
Glucose-Capillary: 86 mg/dL (ref 70–99)
Glucose-Capillary: 94 mg/dL (ref 70–99)

## 2019-11-19 LAB — MAGNESIUM: Magnesium: 1.5 mg/dL — ABNORMAL LOW (ref 1.7–2.4)

## 2019-11-19 LAB — PHOSPHORUS: Phosphorus: 3.3 mg/dL (ref 2.5–4.6)

## 2019-11-19 MED ORDER — ACETAMINOPHEN 500 MG PO TABS
1000.0000 mg | ORAL_TABLET | Freq: Four times a day (QID) | ORAL | Status: DC | PRN
  Administered 2019-11-20: 1000 mg
  Filled 2019-11-19: qty 2

## 2019-11-19 MED ORDER — MAGNESIUM SULFATE 4 GM/100ML IV SOLN
4.0000 g | Freq: Once | INTRAVENOUS | Status: AC
  Administered 2019-11-19: 4 g via INTRAVENOUS
  Filled 2019-11-19: qty 100

## 2019-11-19 MED ORDER — ENOXAPARIN SODIUM 40 MG/0.4ML ~~LOC~~ SOLN
40.0000 mg | Freq: Two times a day (BID) | SUBCUTANEOUS | Status: DC
  Administered 2019-11-19 – 2019-11-25 (×12): 40 mg via SUBCUTANEOUS
  Filled 2019-11-19 (×12): qty 0.4

## 2019-11-19 MED ORDER — SODIUM CHLORIDE 0.9% FLUSH
10.0000 mL | Freq: Two times a day (BID) | INTRAVENOUS | Status: DC
  Administered 2019-11-19 – 2019-11-27 (×16): 10 mL
  Administered 2019-11-27: 20 mL
  Administered 2019-11-28 – 2019-11-30 (×7): 10 mL
  Administered 2019-12-01: 40 mL
  Administered 2019-12-01 – 2019-12-11 (×22): 10 mL
  Administered 2019-12-12: 20 mL
  Administered 2019-12-12 – 2019-12-23 (×22): 10 mL

## 2019-11-19 MED ORDER — DEXMEDETOMIDINE HCL IN NACL 400 MCG/100ML IV SOLN
0.4000 ug/kg/h | INTRAVENOUS | Status: DC
  Administered 2019-11-19: 0.4 ug/kg/h via INTRAVENOUS
  Administered 2019-11-19: 0.8 ug/kg/h via INTRAVENOUS
  Administered 2019-11-19: 1 ug/kg/h via INTRAVENOUS
  Administered 2019-11-19: 0.6 ug/kg/h via INTRAVENOUS
  Administered 2019-11-20: 1 ug/kg/h via INTRAVENOUS
  Administered 2019-11-20 (×2): 1.2 ug/kg/h via INTRAVENOUS
  Administered 2019-11-20 (×2): 1 ug/kg/h via INTRAVENOUS
  Administered 2019-11-20: 1.2 ug/kg/h via INTRAVENOUS
  Filled 2019-11-19 (×9): qty 100

## 2019-11-19 MED ORDER — NOREPINEPHRINE 4 MG/250ML-% IV SOLN
2.0000 ug/min | INTRAVENOUS | Status: DC
  Administered 2019-11-19: 2 ug/min via INTRAVENOUS
  Filled 2019-11-19: qty 250

## 2019-11-19 MED ORDER — SODIUM CHLORIDE 0.9 % IV SOLN
200.0000 mg | Freq: Once | INTRAVENOUS | Status: AC
  Administered 2019-11-19: 200 mg via INTRAVENOUS
  Filled 2019-11-19: qty 200

## 2019-11-19 MED ORDER — SODIUM CHLORIDE 0.9% FLUSH
10.0000 mL | INTRAVENOUS | Status: DC | PRN
  Administered 2019-11-26: 10 mL

## 2019-11-19 MED ORDER — LACTATED RINGERS IV BOLUS
1000.0000 mL | Freq: Once | INTRAVENOUS | Status: AC
  Administered 2019-11-19: 1000 mL via INTRAVENOUS

## 2019-11-19 MED ORDER — SODIUM CHLORIDE 0.9 % IV SOLN: INTRAVENOUS | Status: AC

## 2019-11-19 MED ORDER — TRAVASOL 10 % IV SOLN
INTRAVENOUS | Status: AC
  Filled 2019-11-19: qty 699.6

## 2019-11-19 MED ORDER — SODIUM CHLORIDE 0.9 % IV SOLN
1.0000 g | Freq: Three times a day (TID) | INTRAVENOUS | Status: DC
  Administered 2019-11-19 – 2019-11-30 (×33): 1 g via INTRAVENOUS
  Filled 2019-11-19 (×38): qty 1

## 2019-11-19 MED ORDER — SODIUM CHLORIDE 0.9 % IV SOLN: 250.0000 mL | INTRAVENOUS | Status: DC

## 2019-11-19 MED ORDER — NOREPINEPHRINE 4 MG/250ML-% IV SOLN
0.0000 ug/min | INTRAVENOUS | Status: DC
  Administered 2019-11-19 (×3): 20 ug/min via INTRAVENOUS
  Administered 2019-11-20: 10 ug/min via INTRAVENOUS
  Filled 2019-11-19 (×3): qty 250

## 2019-11-19 NOTE — Progress Notes (Signed)
CRITICAL CARE NOTE 34 yo morbidly obese AAF with complicated case of acute diverticulitis Patient had per drains placed last week, symptoms of fevers and abd pain persisted  Patient underwent robotic surgery to day with pus pockets and lysis of adhesions, there was NO overt perforation of fecal matter intraperitoneal  UNBALE TO WEAN FROM VENT DUE TO MORBID OBESITY, SEVERE METABOLIC ACIDOSIS, HYPOXIC RESP FAILURE  9/8 PCCM CONTACTED FOR CONSULTATION for post op resp failure and acidosis  9/9 failed weaning trials due to increased WOB PICC line to be placed  CC  follow up respiratory failure  SUBJECTIVE Patient remains critically ill Prognosis is guarded Morbidly obese  BP 115/74   Pulse (!) 128   Temp (!) 102.1 F (38.9 C) (Axillary) Comment: blankets removed  Resp (!) 30   Ht 5\' 9"  (1.753 m)   Wt (!) 211 kg   LMP 11/11/2019   SpO2 97%   BMI 68.69 kg/m    I/O last 3 completed shifts: In: 8861.8 [P.O.:240; I.V.:5055.7; Blood:400; IV Piggyback:3166.1] Out: 3050 [Urine:1570; Emesis/NG output:300; Drains:180; Other:700; Blood:300] Total I/O In: 2703.6 [I.V.:2302.5; IV Piggyback:401.1] Out: 50 [Drains:50]  SpO2: 97 % O2 Flow Rate (L/min): 2 L/min FiO2 (%): 40 %  Estimated body mass index is 68.69 kg/m as calculated from the following:   Height as of this encounter: 5\' 9"  (1.753 m).   Weight as of this encounter: 211 kg.  SIGNIFICANT EVENTS   REVIEW OF SYSTEMS  PATIENT IS UNABLE TO PROVIDE COMPLETE REVIEW OF SYSTEMS DUE TO SEVERE CRITICAL ILLNESS        PHYSICAL EXAMINATION:  GENERAL:critically ill appearing, +resp distress HEAD: Normocephalic, atraumatic.  EYES: Pupils equal, round, reactive to light.  No scleral icterus.  MOUTH: Moist mucosal membrane. NECK: Supple.  PULMONARY: +rhonchi, +wheezing CARDIOVASCULAR: S1 and S2. Regular rate and rhythm. No murmurs, rubs, or gallops.  GASTROINTESTINAL: Soft, nontender, +distended.  Positive bowel sounds.    MUSCULOSKELETAL: No swelling, clubbing, or edema.  NEUROLOGIC: obtunded, GCS<8 SKIN:intact,warm,dry  MEDICATIONS: I have reviewed all medications and confirmed regimen as documented   CULTURE RESULTS   Recent Results (from the past 240 hour(s))  Urine culture     Status: None   Collection Time: 11/09/19  4:59 PM   Specimen: Urine, Random  Result Value Ref Range Status   Specimen Description   Final    URINE, RANDOM Performed at [facility], [address], [place], [address] [place]    Special Requests   Final    NONE Performed at [facility], [address], [place], [address] [place]    Culture   Final    NO GROWTH Performed at [facility], [address] [place], [address] [place]    Report Status 11/10/2019 FINAL  Final  Blood culture (routine x 2)     Status: None   Collection Time: 11/09/19  5:58 PM   Specimen: BLOOD  Result Value Ref Range Status   Specimen Description BLOOD BLOOD RIGHT HAND  Final   Special Requests   Final    BOTTLES DRAWN AEROBIC AND ANAEROBIC Blood Culture results may not be optimal due to an inadequate volume of blood received in culture bottles   Culture   Final    NO GROWTH 5 DAYS Performed at [facility], [address], [place], [address] [place]    Report Status 11/14/2019 FINAL  Final  Blood culture (routine x 2)     Status: None   Collection Time:  11/09/19  5:58 PM   Specimen: BLOOD  Result Value Ref Range Status   Specimen Description BLOOD RIGHT ANTECUBITAL  Final   Special Requests   Final    BOTTLES DRAWN AEROBIC AND ANAEROBIC Blood Culture adequate volume   Culture   Final    NO GROWTH 5 DAYS Performed at [facility], [address], [place], [place] [address]    Report Status 11/14/2019 FINAL  Final  SARS Coronavirus 2 by RT PCR (hospital order, performed in [facility] hospital lab) Nasopharyngeal Nasopharyngeal Swab     Status: None   Collection Time: 11/09/19  5:58 PM    Specimen: Nasopharyngeal Swab  Result Value Ref Range Status   SARS Coronavirus 2 NEGATIVE NEGATIVE Final    Comment: (NOTE) SARS-CoV-2 target nucleic acids are NOT DETECTED.  The SARS-CoV-2 RNA is generally detectable in upper and lower respiratory specimens during the acute phase of infection. The lowest concentration of SARS-CoV-2 viral copies this assay can detect is 250 copies / mL. A negative result does not preclude SARS-CoV-2 infection and should not be used as the sole basis for treatment or other patient management decisions.  A negative result may occur with improper specimen collection / handling, submission of specimen other than nasopharyngeal swab, presence of viral mutation(s) within the areas targeted by this assay, and inadequate number of viral copies (<250 copies / mL). A negative result must be combined with clinical observations, patient history, and epidemiological information.  Fact Sheet for Patients:   BoilerBrush.com.cy  Fact Sheet for Healthcare Providers: https://pope.com/  This test is not yet approved or  cleared by the [place] FDA and has been authorized for detection and/or diagnosis of SARS-CoV-2 by FDA under an Emergency Use Authorization (EUA).  This EUA will remain in effect (meaning this test can be used) for the duration of the COVID-19 declaration under Section 564(b)(1) of the Act, 21 U.S.C. section 360bbb-3(b)(1), unless the authorization is terminated or revoked sooner.  Performed at [facility], [address], [place], [place] [address]   Aerobic/Anaerobic Culture (surgical/deep wound)     Status: None (Preliminary result)   Collection Time: 11/13/19  6:52 PM   Specimen: Abscess  Result Value Ref Range Status   Specimen Description   Final    ABSCESS Performed at [facility], [address], [place], [place] [address]    Special Requests   Final     Normal Performed at [facility], [address] [place], [place] [address]    Gram Stain   Final    MODERATE WBC PRESENT, PREDOMINANTLY PMN ABUNDANT GRAM NEGATIVE RODS ABUNDANT GRAM POSITIVE COCCI IN PAIRS IN CHAINS FEW GRAM POSITIVE RODS Performed at [facility], [address] [place], [place] [address]    Culture   Final    ABUNDANT ESCHERICHIA COLI STREPTOCOCCUS GROUP C Beta hemolytic streptococci are predictably susceptible to penicillin and other beta lactams. Susceptibility testing not routinely performed. NO ANAEROBES ISOLATED; CULTURE IN PROGRESS FOR 5 DAYS    Report Status PENDING  Incomplete   Organism ID, Bacteria ESCHERICHIA COLI  Final      Susceptibility   Escherichia coli - MIC*    AMPICILLIN <=2 SENSITIVE Sensitive     CEFAZOLIN <=4 SENSITIVE Sensitive     CEFEPIME <=0.12 SENSITIVE Sensitive     CEFTAZIDIME <=1 SENSITIVE Sensitive     CEFTRIAXONE <=0.25 SENSITIVE Sensitive     CIPROFLOXACIN <=0.25 SENSITIVE Sensitive     GENTAMICIN <=  1 SENSITIVE Sensitive     IMIPENEM <=0.25 SENSITIVE Sensitive     TRIMETH/SULFA <=20 SENSITIVE Sensitive     AMPICILLIN/SULBACTAM <=2 SENSITIVE Sensitive     PIP/TAZO <=4 SENSITIVE Sensitive     * ABUNDANT ESCHERICHIA COLI  MRSA PCR Screening     Status: None   Collection Time: 11/18/19  6:36 PM   Specimen: Nasopharyngeal  Result Value Ref Range Status   MRSA by PCR NEGATIVE NEGATIVE Final    Comment:        The GeneXpert MRSA Assay (FDA approved for NASAL specimens only), is one component of a comprehensive MRSA colonization surveillance program. It is not intended to diagnose MRSA infection nor to guide or monitor treatment for MRSA infections. Performed at [facility], [address] [place], [place] [address]           IMAGING    DG Abd 1 View  Result Date: 11/18/2019 CLINICAL DATA:  NG tube placement. EXAM: ABDOMEN - 1 VIEW COMPARISON:  11/10/2019 FINDINGS: The NG tube tip  projects over the gastric antrum/pylorus. The tip is pointed distally. IMPRESSION: NG tube tip projects over the gastric antrum/pylorus. Electronically Signed   By: Katherine Mantle M.D.   On: 11/18/2019 19:28     Nutrition Status: Nutrition Problem: Inadequate oral intake Etiology: inability to eat Signs/Symptoms: NPO status Interventions: TPN   CBC    Component Value Date/Time   WBC 17.4 (H) 11/19/2019 0436   RBC 3.52 (L) 11/19/2019 0436   HGB 8.7 (L) 11/19/2019 0436   HCT 26.0 (L) 11/19/2019 0436   PLT 347 11/19/2019 0436   MCV 73.9 (L) 11/19/2019 0436   MCH 24.7 (L) 11/19/2019 0436   MCHC 33.5 11/19/2019 0436   RDW 18.7 (H) 11/19/2019 0436   LYMPHSABS 1.1 11/19/2019 0436   MONOABS 0.6 11/19/2019 0436   EOSABS 0.0 11/19/2019 0436   BASOSABS 0.1 11/19/2019 0436    BMP Latest Ref Rng & Units 11/19/2019 11/18/2019 11/17/2019  Glucose 70 - 99 mg/dL 604(V) 409(W) 119(J)  BUN 6 - 20 mg/dL 9 5(L) 6  Creatinine 4.78 - 1.00 mg/dL 2.95 6.21 3.08  Sodium 135 - 145 mmol/L 140 137 138  Potassium 3.5 - 5.1 mmol/L 4.2 3.5 3.5  Chloride 98 - 111 mmol/L 110 109 106  CO2 22 - 32 mmol/L 20(L) 24 22  Calcium 8.9 - 10.3 mg/dL 6.6(L) 7.4(L) 7.3(L)      Indwelling Urinary Catheter continued, requirement due to   Reason to continue Indwelling Urinary Catheter strict Intake/Output monitoring for hemodynamic instability         Ventilator continued, requirement due to severe respiratory failure   Ventilator Sedation RASS 0 to -2      ASSESSMENT AND PLAN SYNOPSIS  Severe ACUTE Hypoxic and Hypercapnic Respiratory Failure with post op resp failure due to acute metabolic acidosis due to acute diverticulitis with sepsis(elevated WBC and elevated HR 110)  Severe ACUTE Hypoxic and Hypercapnic Respiratory Failure -continue Full MV support -continue Bronchodilator Therapy -Wean Fio2 and PEEP as tolerated -VAP/VENT bundle implementation Failed weaning trial due to increased abd pressure  and morbid obesity   Morbid obesity, possible OSA.   Will certainly impact respiratory mechanics, ventilator weaning Suspect will need to consider additional PEEP, possible extubation to BiPAP when appropriate to consider   ACUTE KIDNEY INJURY/Renal Failure -continue Foley Catheter-assess need -Avoid nephrotoxic agents -Follow urine output, BMP -Ensure adequate renal perfusion, optimize oxygenation -Renal dose medications     NEUROLOGY - intubated and  sedated - minimal sedation to achieve a RASS goal: -1   SHOCK-SEPSIS/HYPOVOLUMIC -use vasopressors to keep MAP>65 as needed -follow ABG and LA -follow up cultures -emperic ABX  CARDIAC ICU monitoring  ID -continue IV abx as prescibed -follow up cultures  GI-acute diverticulitis with micro perforation and abscesses  S/p robotic surgery for lysis of adhesions  Follow up GEN surfery recs PICC line to be placed for TPN  GI PROPHYLAXIS as indicated  NUTRITIONAL STATUS Nutrition Status: Nutrition Problem: Inadequate oral intake Etiology: inability to eat Signs/Symptoms: NPO status Interventions: TPN  Constipation protocol as indicated  ENDO - will use ICU hypoglycemic\Hyperglycemia protocol if indicated     ELECTROLYTES -follow labs as needed -replace as needed -pharmacy consultation and following   DVT/GI PRX ordered and assessed TRANSFUSIONS AS NEEDED MONITOR FSBS I Assessed the need for Labs I Assessed the need for Foley I Assessed the need for Central Venous Line Family Discussion when available I Assessed the need for Mobilization I made an Assessment of medications to be adjusted accordingly Safety Risk assessment completed   CASE DISCUSSED IN MULTIDISCIPLINARY ROUNDS WITH ICU TEAM  Critical Care Time devoted to patient care services described in this note is 35 minutes.   Overall, patient is critically ill, prognosis is guarded.   Failue to wean from vent due to severe morbid obesity, s/p  abd sugery   Cartier Mapel Santiago Gladavid Tavio Biegel, M.D.  [facility]  Medical Director [facility] Medical Director [facility] Cardio-P[facility] Department

## 2019-11-19 NOTE — Anesthesia Postprocedure Evaluation (Signed)
Anesthesia Post Note  Patient: Amanda Reyes  Procedure(s) Performed: CYSTOSCOPY INDOCYANINE GREEN FLUORESCENCE IMAGING (ICG) (Bilateral Ureter) XI ROBOTIC ASSISTED LAPAROSCOPIC LYSIS OF ADHESION; IRRIGATION AND DEBRIDEMENT ABDOMEN; INTRA-ABDOMINAL/PELVIC ABCESSES AND DRAIN PLACEMENT.  Patient location during evaluation: ICU Anesthesia Type: General Level of consciousness: awake and patient remains intubated per anesthesia plan Pain management: pain level controlled Vital Signs Assessment: post-procedure vital signs reviewed and stable Respiratory status: patient remains intubated per anesthesia plan Cardiovascular status: stable Postop Assessment: no apparent nausea or vomiting Anesthetic complications: no   No complications documented.   Last Vitals:  Vitals:   11/19/19 0714 11/19/19 0735  BP: (!) 95/54 91/75  Pulse: (!) 123 (!) 124  Resp: (!) 28 (!) 24  Temp:    SpO2: 98% 99%    Last Pain:  Vitals:   11/19/19 0400  TempSrc: Oral  PainSc:                  Amanda Reyes

## 2019-11-19 NOTE — Progress Notes (Signed)
Levophed titrated for soft blood pressures while on sedation. Failed SBT trial due to low tidal volumes and tachypnea. Elevated temperature, tylenol administered and ice packs applied.

## 2019-11-19 NOTE — Progress Notes (Signed)
Peripherally Inserted Central Catheter Placement  The IV Nurse has discussed with the patient and/or persons authorized to consent for the patient, the purpose of this procedure and the potential benefits and risks involved with this procedure.  The benefits include less needle sticks, lab draws from the catheter, and the patient may be discharged home with the catheter. Risks include, but not limited to, infection, bleeding, blood clot (thrombus formation), and puncture of an artery; nerve damage and irregular heartbeat and possibility to perform a PICC exchange if needed/ordered by physician.  Alternatives to this procedure were also discussed.  Bard Power PICC patient education guide, fact sheet on infection prevention and patient information card has been provided to patient /or left at bedside.    PICC Placement Documentation  PICC Triple Lumen 11/19/19 PICC Right Cephalic 41 cm 2 cm (Active)  Indication for Insertion or Continuance of Line Administration of hyperosmolar/irritating solutions (i.e. TPN, Vancomycin, etc.) 11/19/19 0958  Exposed Catheter (cm) 2 cm 11/19/19 0958  Site Assessment Clean;Dry;Intact 11/19/19 0958  Lumen #1 Status Flushed;Blood return noted 11/19/19 0958  Lumen #2 Status Flushed;Blood return noted 11/19/19 0958  Lumen #3 Status Flushed;Blood return noted 11/19/19 0958  Dressing Type Transparent 11/19/19 0958  Dressing Status Clean;Dry;Intact;Antimicrobial disc in place;Other (Comment) 11/19/19 0958  Dressing Intervention New dressing 11/19/19 0958  Dressing Change Due 11/26/19 11/19/19 0958   Consent signed by mom at bedside    Maximino Greenland 11/19/2019, 9:59 AM

## 2019-11-19 NOTE — Consult Note (Signed)
NAME: Amanda Reyes  DOB: 1985/07/30  MRN: [id number]  Date/Time: 11/19/2019 1:00 PM  REQUESTING PROVIDER: Dr. Christian Mate Subjective:  REASON FOR CONSULT: intra abdominal infection- antifungal coverage ? Amanda Reyes is a 34 y.o. female with a history of hypertension and morbid obesity, cholecystectomy for choledocholithiasis in 2014 presented to the ED on 11/09/2019 with persistent generalized abdominal pain that began 2 to 3 weeks ago but was getting worse in the past 2 days.  She also was complaining of not able to be urinate.  She did not have any headache or fever chest pain cough shortness of breath vomiting diarrhea on presentation.  In the ED her vitals were temperature of 98.6, blood pressure 119/98, heart rate of 130 and respiratory rate of 24.  Her weight was 360 pounds. Labs revealed a WBC of 13.5, platelet of 489, creatinine of 1.75, glucose of 180 and lactate was 3.1. Chest x-ray was negative CT abdomen was concerning for acute perforated diverticulitis. General surgery had seen her on 11/10/2019 and recommended conservative management initially.  She was also started on IV ciprofloxacin and Flagyl. She underwent CT-guided percutaneous drainage placement in the abscess in the cul-de-sac on 11/13/2019. The culture was E. coli and group C strep. As the patient was continuing to have fever and leukocytosis a repeat CT scan was done on 11/17/2019 Which showed numerous interloop abscess in the lower abdomen with the largest measuring 8 into 6.5 cm with gas and fluid in the area.  There was signs of colonic perforation related to the diverticulitis with worsening fluid and gas containing abscess throughout the lower abdomen and pelvis.. Patient was taken for surgery on 11/18/2019 and underwent robotic lysis of adhesions with irrigation and debridement of multiple intra-abdominal peritoneal and pelvic abscesses with drain placement. She was transferred to ICU and remains intubated. I am asked to  see the patient for broadening antibiotic coverage to include antifungal.   Past Medical History:  Diagnosis Date  . Hypertension     Past Surgical History:  Procedure Laterality Date  . CHOLECYSTECTOMY N/A 08/08/2012   Procedure: LAPAROSCOPIC CHOLECYSTECTOMY;  Surgeon: Jamesetta So, MD;  Location: [facility];  Service: General;  Laterality: N/A;  . CYSTOSCOPY  11/18/2019   Procedure: CYSTOSCOPY;  Surgeon: Ronny Bacon, MD;  Location: [facility];  Service: General;;  . ERCP N/A 08/02/2012   Procedure: ENDOSCOPIC RETROGRADE CHOLANGIOPANCREATOGRAPHY (ERCP);  Surgeon: Rogene Houston, MD;  Location: [facility];  Service: Gastroenterology;  Laterality: N/A;  . IRRIGATION AND DEBRIDEMENT ABDOMEN  11/18/2019   Procedure: IRRIGATION AND DEBRIDEMENT ABDOMEN; INTRA-ABDOMINAL/PELVIC ABCESSES AND DRAIN PLACEMENT.;  Surgeon: Ronny Bacon, MD;  Location: [facility];  Service: General;;  . PANCREATIC STENT PLACEMENT N/A 08/02/2012   Procedure: PANCREATIC STENT PLACEMENT;  Surgeon: Rogene Houston, MD;  Location: [facility];  Service: Gastroenterology;  Laterality: N/A;  . ROBOTIC ASSISTED LAPAROSCOPIC LYSIS OF ADHESION  11/18/2019   Procedure: XI ROBOTIC ASSISTED LAPAROSCOPIC LYSIS OF ADHESION;;  Surgeon: Ronny Bacon, MD;  Location: [facility];  Service: General;;  . SPHINCTEROTOMY N/A 08/02/2012   Procedure: SPHINCTEROTOMY;  Surgeon: Rogene Houston, MD;  Location: [facility];  Service: Gastroenterology;  Laterality: N/A;  . STONE EXTRACTION WITH BASKET N/A 08/02/2012   Procedure: STONE EXTRACTION WITH BASKET;  Surgeon: Rogene Houston, MD;  Location: [facility];  Service: Gastroenterology;  Laterality: N/A;    Social History   Socioeconomic History  . Marital status: Single    Spouse name: Not on file  . Number  of children: Not on file  . Years of education: Not on file  . Highest education level: Not on file  Occupational History  . Not on file  Tobacco Use  . Smoking status: Never Smoker  . Smokeless tobacco:  Never Used  Substance and Sexual Activity  . Alcohol use: No  . Drug use: No  . Sexual activity: Never    Birth control/protection: Abstinence  Other Topics Concern  . Not on file  Social History Narrative  . Not on file   Social Determinants of Health   Financial Resource Strain:   . Difficulty of Paying Living Expenses: Not on file  Food Insecurity:   . Worried About Charity fundraiser in the Last Year: Not on file  . Ran Out of Food in the Last Year: Not on file  Transportation Needs:   . Lack of Transportation (Medical): Not on file  . Lack of Transportation (Non-Medical): Not on file  Physical Activity:   . Days of Exercise per Week: Not on file  . Minutes of Exercise per Session: Not on file  Stress:   . Feeling of Stress : Not on file  Social Connections:   . Frequency of Communication with Friends and Family: Not on file  . Frequency of Social Gatherings with Friends and Family: Not on file  . Attends Religious Services: Not on file  . Active Member of Clubs or Organizations: Not on file  . Attends Archivist Meetings: Not on file  . Marital Status: Not on file  Intimate Partner Violence:   . Fear of Current or Ex-Partner: Not on file  . Emotionally Abused: Not on file  . Physically Abused: Not on file  . Sexually Abused: Not on file    History reviewed. No pertinent family history. Allergies  Allergen Reactions  . Penicillins Hives    Did it involve swelling of the face/tongue/throat, SOB, or low BP? No Did it involve sudden or severe rash/hives, skin peeling, or any reaction on the inside of your mouth or nose? Yes Did you need to seek medical attention at a hospital or doctor's office? No When did it last happen? If all above answers are "NO", may proceed with cephalosporin use..3  . Tomato Rash   ? Current Facility-Administered Medications  Medication Dose Route Frequency Provider Last Rate Last Admin  . 0.9 %  sodium chloride infusion  (Manually program via Guardrails IV Fluids)   Intravenous Once Penwarden, Amy, MD      . 0.9 %  sodium chloride infusion   Intravenous PRN Fredirick Maudlin, MD   Stopped at 11/19/19 [phone]  . 0.9 %  sodium chloride infusion   Intravenous Continuous Benita Gutter, RPH 95 mL/hr at 11/19/19 0102 Rate Verify at 11/19/19 0733  . 0.9 %  sodium chloride infusion  250 mL Intravenous Continuous Kasa, Kurian, MD      . 0.9 %  sodium chloride infusion   Intravenous Continuous Benita Gutter, RPH      . acetaminophen (TYLENOL) tablet 1,000 mg  1,000 mg Oral Q6H PRN Edison Simon R, PA-C   1,000 mg at 11/19/19 1203  . alum & mag hydroxide-simeth (MAALOX/MYLANTA) 200-200-20 MG/5ML suspension 30 mL  30 mL Oral Q4H PRN Ronny Bacon, MD   30 mL at 11/15/19 2032  . chlorhexidine gluconate (MEDLINE KIT) (PERIDEX) 0.12 % solution 15 mL  15 mL Mouth Rinse BID Flora Lipps, MD   15 mL at 11/18/19 2127  .  chlorhexidine gluconate (MEDLINE KIT) (PERIDEX) 0.12 % solution 15 mL  15 mL Mouth Rinse BID Flora Lipps, MD   15 mL at 11/19/19 0826  . Chlorhexidine Gluconate Cloth 2 % PADS 6 each  6 each Topical [id number] Flora Lipps, MD   6 each at 11/19/19 0557  . ciprofloxacin (CIPRO) IVPB 400 mg  400 mg Intravenous Q12H Fredirick Maudlin, MD 200 mL/hr at 11/19/19 1059 400 mg at 11/19/19 1059   And  . metroNIDAZOLE (FLAGYL) IVPB 500 mg  500 mg Intravenous Q8H Fredirick Maudlin, MD   Stopped at 11/19/19 0701  . dexmedetomidine (PRECEDEX) 400 MCG/100ML (4 mcg/mL) infusion  0.4-1.2 mcg/kg/hr Intravenous Titrated Flora Lipps, MD 31.7 mL/hr at 11/19/19 1052 0.6 mcg/kg/hr at 11/19/19 1052  . enoxaparin (LOVENOX) injection 40 mg  40 mg Subcutaneous Q12H Kasa, Kurian, MD      . fentaNYL 2584mg in NS 2521m(1052mml) infusion-PREMIX  0-400 mcg/hr Intravenous Continuous BlaAwilda BillP 40 mL/hr at 11/19/19 0824 400 mcg/hr at 11/19/19 0824  . HYDROmorphone (DILAUDID) injection 0.5-1 mg  0.5-1 mg Intravenous Q2H PRN SchTylene FantasiaA-C   1 mg at 11/18/19 [id number] ibuprofen (ADVIL) tablet 800 mg  800 mg Oral Q8H PRN RodRonny BaconD   800 mg at 11/18/19 0140  . insulin aspart (novoLOG) injection 0-15 Units  0-15 Units Subcutaneous Q4H CanFredirick MaudlinD   2 Units at 11/15/19 2029  . magnesium hydroxide (MILK OF MAGNESIA) suspension 30 mL  30 mL Oral Daily RodRonny BaconD   30 mL at 11/14/19 0817  . MEDLINE mouth rinse  15 mL Mouth Rinse 10 times per day KasFlora LippsD   15 mL at 11/19/19 1221  . methocarbamol (ROBAXIN) tablet 500 mg  500 mg Oral Q6H PRN CanFredirick MaudlinD   500 mg at 11/10/19 [id number] norepinephrine (LEVOPHED) 4mg59m 250mL30mmix infusion  0-40 mcg/min Intravenous Titrated Kasa,Flora Lipps75 mL/hr at 11/19/19 1221 20 mcg/min at 11/19/19 1221  . ondansetron (ZOFRAN-ODT) disintegrating tablet 4 mg  4 mg Oral Q6H PRN CannoFredirick Maudlin  4 mg at 11/15/19 2028   Or  . ondansetron (ZOFRThe Everett Clinicection 4 mg  4 mg Intravenous Q6H PRN CannoFredirick Maudlin  4 mg at 11/17/19 0801  . oxyCODONE (Oxy IR/ROXICODONE) immediate release tablet 5-10 mg  5-10 mg Oral Q4H PRN PiscoOlean Ree  10 mg at 11/16/19 1841  . pantoprazole (PROTONIX) injection 40 mg  40 mg Intravenous Q12H RodenRonny Bacon  40 mg at 11/19/19 1054  . polyethylene glycol (MIRALAX / GLYCOLAX) packet 17 g  17 g Oral Daily RodenRonny Bacon  17 g at 11/14/19 0817  . promethazine (PHENERGAN) injection 12.5 mg  12.5 mg Intravenous Q6H PRN Piscoya, Jose, MD   12.5 mg at 11/16/19 1853  . sodium bicarbonate 150 mEq in sterile water 1,000 mL infusion   Intravenous Continuous ChappBenita Gutter 125 mL/hr at 11/19/19 0733 3832 Verify at 11/19/19 0733  . sodium chloride flush (NS) 0.9 % injection 10-40 mL  10-40 mL Intracatheter PRN CannoFredirick Maudlin     . sodium chloride flush (NS) 0.9 % injection 10-40 mL  10-40 mL Intracatheter Q12H Kasa,Flora Lipps  10 mL at 11/19/19 1105  . sodium chloride flush (NS) 0.9 % injection 10-40 mL   10-40 mL Intracatheter PRN Kasa,Flora Lipps     . sodium chloride flush (NS) 0.9 %  injection 5 mL  5 mL Intracatheter Q8H Aletta Edouard, MD   5 mL at 11/19/19 0558  . TPN ADULT (ION)   Intravenous Continuous TPN Benita Gutter, RPH         Abtx:  Anti-infectives (From admission, onward)   Start     Dose/Rate Route Frequency Ordered Stop   11/09/19 2200  metroNIDAZOLE (FLAGYL) IVPB 500 mg       "And" Linked Group Details   500 mg 100 mL/hr over 60 Minutes Intravenous Every 8 hours 11/09/19 1810     11/09/19 2000  ciprofloxacin (CIPRO) IVPB 400 mg       "And" Linked Group Details   400 mg 200 mL/hr over 60 Minutes Intravenous Every 12 hours 11/09/19 1810     11/09/19 1815  metroNIDAZOLE (FLAGYL) IVPB 500 mg        500 mg 100 mL/hr over 60 Minutes Intravenous  Once 11/09/19 1800 11/09/19 2317   11/09/19 1815  ciprofloxacin (CIPRO) IVPB 400 mg        400 mg 200 mL/hr over 60 Minutes Intravenous  Once 11/09/19 1800 11/09/19 1946      REVIEW OF SYSTEMS:  NA Objective:  VITALS:  BP 105/68   Pulse (!) 101   Temp (!) 102.5 F (39.2 C) (Axillary)   Resp (!) 25   Ht '5\' 9"'  (1.753 m)   Wt (!) 211 kg   LMP 11/11/2019   SpO2 97%   BMI 68.69 kg/m  PHYSICAL EXAM:  General: Intubated, sedated and on pressors, morbid obestiy, anasarca Head: Normocephalic, without obvious abnormality, atraumatic. Eyes: Conjunctivae clear, anicteric sclerae. Pupils are equal ENT cannot examine, NG tube Neck: Supple, Back: No CVA tenderness. Lungs: Bilateral air entry decreased bases. Heart: Tachycardia Abdomen:  distended. L.edema of the abdominal wall Extremities: edematous Rt PICC, left midline Skin: No rashes or lesions. Or bruising Lymph: Cervical, supraclavicular normal. Neurologic: cannot be assessed  Pertinent Labs Lab Results CBC    Component Value Date/Time   WBC 17.4 (H) 11/19/2019 0436   RBC 3.52 (L) 11/19/2019 0436   HGB 8.7 (L) 11/19/2019 0436   HCT 26.0 (L) 11/19/2019  0436   PLT 347 11/19/2019 0436   MCV 73.9 (L) 11/19/2019 0436   MCH 24.7 (L) 11/19/2019 0436   MCHC 33.5 11/19/2019 0436   RDW 18.7 (H) 11/19/2019 0436   LYMPHSABS 1.1 11/19/2019 0436   MONOABS 0.6 11/19/2019 0436   EOSABS 0.0 11/19/2019 0436   BASOSABS 0.1 11/19/2019 0436    CMP Latest Ref Rng & Units 11/19/2019 11/18/2019 11/17/2019  Glucose 70 - 99 mg/dL 125(H) 103(H) 108(H)  BUN 6 - 20 mg/dL 9 5(L) 6  Creatinine 0.44 - 1.00 mg/dL 0.97 0.93 0.80  Sodium 135 - 145 mmol/L 140 137 138  Potassium 3.5 - 5.1 mmol/L 4.2 3.5 3.5  Chloride 98 - 111 mmol/L 110 109 106  CO2 22 - 32 mmol/L 20(L) 24 22  Calcium 8.9 - 10.3 mg/dL 6.6(L) 7.4(L) 7.3(L)  Total Protein 6.5 - 8.1 g/dL 5.0(L) - -  Total Bilirubin 0.3 - 1.2 mg/dL 0.8 - -  Alkaline Phos 38 - 126 U/L 43 - -  AST 15 - 41 U/L 11(L) - -  ALT 0 - 44 U/L 9 - -      Microbiology: Recent Results (from the past 240 hour(s))  Urine culture     Status: None   Collection Time: 11/09/19  4:59 PM   Specimen: Urine, Random  Result Value Ref Range  Status   Specimen Description   Final    URINE, RANDOM Performed at [facility], [address], [place], [place] [address]    Special Requests   Final    NONE Performed at [facility], [address] [place], [place] [address]    Culture   Final    NO GROWTH Performed at [facility], [place] [address], [place], [place] [address]    Report Status 11/10/2019 FINAL  Final  Blood culture (routine x 2)     Status: None   Collection Time: 11/09/19  5:58 PM   Specimen: BLOOD  Result Value Ref Range Status   Specimen Description BLOOD BLOOD RIGHT HAND  Final   Special Requests   Final    BOTTLES DRAWN AEROBIC AND ANAEROBIC Blood Culture results may not be optimal due to an inadequate volume of blood received in culture bottles   Culture   Final    NO GROWTH 5 DAYS Performed at [facility], [address] [place], [place] [address]    Report Status  11/14/2019 FINAL  Final  Blood culture (routine x 2)     Status: None   Collection Time: 11/09/19  5:58 PM   Specimen: BLOOD  Result Value Ref Range Status   Specimen Description BLOOD RIGHT ANTECUBITAL  Final   Special Requests   Final    BOTTLES DRAWN AEROBIC AND ANAEROBIC Blood Culture adequate volume   Culture   Final    NO GROWTH 5 DAYS Performed at [facility], [address], [place], [place] [address]    Report Status 11/14/2019 FINAL  Final  SARS Coronavirus 2 by RT PCR (hospital order, performed in [facility] hospital lab) Nasopharyngeal Nasopharyngeal Swab     Status: None   Collection Time: 11/09/19  5:58 PM   Specimen: Nasopharyngeal Swab  Result Value Ref Range Status   SARS Coronavirus 2 NEGATIVE NEGATIVE Final    Comment: (NOTE) SARS-CoV-2 target nucleic acids are NOT DETECTED.  The SARS-CoV-2 RNA is generally detectable in upper and lower respiratory specimens during the acute phase of infection. The lowest concentration of SARS-CoV-2 viral copies this assay can detect is 250 copies / mL. A negative result does not preclude SARS-CoV-2 infection and should not be used as the sole basis for treatment or other patient management decisions.  A negative result may occur with improper specimen collection / handling, submission of specimen other than nasopharyngeal swab, presence of viral mutation(s) within the areas targeted by this assay, and inadequate number of viral copies (<250 copies / mL). A negative result must be combined with clinical observations, patient history, and epidemiological information.  Fact Sheet for Patients:   StrictlyIdeas.no  Fact Sheet for Healthcare Providers: BankingDealers.co.za  This test is not yet approved or  cleared by the [place] FDA and has been authorized for detection and/or diagnosis of SARS-CoV-2 by FDA under an Emergency Use Authorization (EUA).  This EUA will  remain in effect (meaning this test can be used) for the duration of the COVID-19 declaration under Section 564(b)(1) of the Act, 21 U.S.C. section 360bbb-3(b)(1), unless the authorization is terminated or revoked sooner.  Performed at [facility], [address] [place], [place] [address]   Aerobic/Anaerobic Culture (surgical/deep wound)     Status: None (Preliminary result)   Collection Time: 11/13/19  6:52 PM   Specimen: Abscess  Result Value Ref Range Status   Specimen Description   Final  ABSCESS Performed at [facility], [address] [place], [place] [address]    Special Requests   Final    Normal Performed at [facility], [address] [place], [place] [address]    Gram Stain   Final    MODERATE WBC PRESENT, PREDOMINANTLY PMN ABUNDANT GRAM NEGATIVE RODS ABUNDANT GRAM POSITIVE COCCI IN PAIRS IN CHAINS FEW GRAM POSITIVE RODS Performed at [facility], [place] [address], [place], [place] [address]    Culture   Final    ABUNDANT ESCHERICHIA COLI STREPTOCOCCUS GROUP C Beta hemolytic streptococci are predictably susceptible to penicillin and other beta lactams. Susceptibility testing not routinely performed. NO ANAEROBES ISOLATED; CULTURE IN PROGRESS FOR 5 DAYS    Report Status PENDING  Incomplete   Organism ID, Bacteria ESCHERICHIA COLI  Final      Susceptibility   Escherichia coli - MIC*    AMPICILLIN <=2 SENSITIVE Sensitive     CEFAZOLIN <=4 SENSITIVE Sensitive     CEFEPIME <=0.12 SENSITIVE Sensitive     CEFTAZIDIME <=1 SENSITIVE Sensitive     CEFTRIAXONE <=0.25 SENSITIVE Sensitive     CIPROFLOXACIN <=0.25 SENSITIVE Sensitive     GENTAMICIN <=1 SENSITIVE Sensitive     IMIPENEM <=0.25 SENSITIVE Sensitive     TRIMETH/SULFA <=20 SENSITIVE Sensitive     AMPICILLIN/SULBACTAM <=2 SENSITIVE Sensitive     PIP/TAZO <=4 SENSITIVE Sensitive     * ABUNDANT ESCHERICHIA COLI  MRSA PCR Screening     Status: None   Collection Time:  11/18/19  6:36 PM   Specimen: Nasopharyngeal  Result Value Ref Range Status   MRSA by PCR NEGATIVE NEGATIVE Final    Comment:        The GeneXpert MRSA Assay (FDA approved for NASAL specimens only), is one component of a comprehensive MRSA colonization surveillance program. It is not intended to diagnose MRSA infection nor to guide or monitor treatment for MRSA infections. Performed at [facility], [address] [place],  [address]     IMAGING RESULTS: I have personally reviewed the films See above ? Impression/Recommendation ? ? ?Perforated diverticulitis with extensive intra-abdominal loop abscesses Status post laparoscopy and washout. Patient has been on ciprofloxacin and Flagyl since 11/09/2019 but continues to spike fever with leukocytosis.  Because of extensive abscess need to cover Enterococcus and hence will change both the antibiotics to meropenem.   will also add anidulafungin as she is septic and risk for candida infection ___________________________________________________ Discussed with care team Note:  This document was prepared using Dragon voice recognition software and may include unintentional dictation errors.

## 2019-11-19 NOTE — Progress Notes (Signed)
CH visited pt.'s rm. while rounding on ICU and observing family present at bedside; pt. intubated but awake, writing on clipboard to communicate w/mother at bedside; pt. appears calm at this time.  Mother shared pt. has been in hospital for over a week and had surgery yesterday; after surgery pt. on vent for breathing support --> this and ICU admission was frightening to mthr. yesterday.  Mother shared that yesterday she felt the coimmunication b/w her and medical team was insufficient --> she did not know pt. would not be returning to her original rm. until she finally learned pt. would got to ICU; this AM mthr. said she had a very helpful conversation w/doctor managing dtr.'s care.  CH told pt. she was in chaplains' prayers --> asked if pt needed enaything at this time and pt. made motion to pray; CH prayed for pt.'s healing and strength.  Chaplains remain available as needed.

## 2019-11-19 NOTE — Progress Notes (Addendum)
Nutrition Follow-up  DOCUMENTATION CODES:   Morbid obesity  INTERVENTION:  Initiate TPN per pharmacy.  Monitor magnesium, potassium, and phosphorus daily for at least 3 days, MD to replete as needed, as pt is at risk for refeeding syndrome.  NUTRITION DIAGNOSIS:   Inadequate oral intake related to inability to eat as evidenced by NPO status.  Ongoing.  GOAL:   Patient will meet greater than or equal to 90% of their needs  Addressing with initiation of TPN today.  MONITOR:   Labs, Weight trends, I & O's  REASON FOR ASSESSMENT:   Ventilator, Consult New TPN/TNA  ASSESSMENT:   34 year old female with PMHx of HTN admitted with perforated diverticulitis with contained abscesses.  9/8 s/p robotic lysis of adhesions with irrigation and debridement of multiple intra-abdominal/peritoneal/pelvis abscesses with drain placement  Patient unable to extubate after operation and now in ICU on ventilator. TPN has not been initiated yet as PICC line was never placed. PICC is being placed today.  Patient is currently intubated on ventilator support MV: 14 L/min Temp (24hrs), Avg:99 F (37.2 C), Min:97.8 F (36.6 C), Max:102.1 F (38.9 C)  Propofol: 25.9 ml/hr (684 kcal daily; ~62 grams lipid daily)  Medications reviewed and include: Novolog 0-15 unit Q4hrs, milk of magnesia 30 mL daily, Protonix, Miralax, Cipro, Flagyl, fentanyl gtt, magnesium sulfate 4 grams IV once today, propofol gtt, sodium bicarbonate 150 mEq in sterile water at 125 mL/hr.  Labs reviewed: CBG 86-120, CO2 20, Magnesium 1.5.  I/O: 1570 mL UOP yesterday (0.3 mL/kg/hr); 300 mL output from OGT yesterday; 50 mL output from right back JP drain yesterday; 130 mL output from RLQ JP drain yesterday  Weight trend: 163.3 kg on 8/30 and 211 kg on 9/8; unsure which wt is more accurate  Enteral Access: OGT placed 9/8 to LIS; terminates over gastric antrum/pylorus per abdominal x-ray 9/8  IV Access: PICC being placed  today  Discussed with RN. Patient failed SBT this AM. Discussed with Pharmacy.  Diet Order:   Diet Order            Diet NPO time specified Except for: Sips with Meds  Diet effective now                EDUCATION NEEDS:   No education needs have been identified at this time  Skin:  Skin Assessment: Skin Integrity Issues: (closed incisions to abdomen and vagina)  Last BM:  11/18/2019 - large type 7  Height:   Ht Readings from Last 1 Encounters:  11/09/19 5\' 9"  (1.753 m)   Weight:   Wt Readings from Last 1 Encounters:  11/18/19 (!) 211 kg   Ideal Body Weight:  65.9 kg  BMI:  Body mass index is 68.69 kg/m.  Estimated Nutritional Needs:   Kcal:  2600  Protein:  130-140 grams  Fluid:  >/= 2 L/day  01/18/20, MS, RD, LDN Pager number available on Amion

## 2019-11-19 NOTE — Consult Note (Signed)
Pharmacy Antibiotic Note  Amanda Reyes is a 34 y.o. female admitted on 11/09/2019 with intra-abdominal abscess. Patient is status post laparoscopic lysis of adhesions and drainage of intra-abdominal abscess with further placement of RLQ drain on 9/8. Hospital course complicated by post-operative respiratory distress requiring intubation 9/8. Patient was on empiric antimicrobial coverage with ciprofloxacin + metronidazole since 8/30. Abscess cultures from 9/3 with pan-sensitive E coli + Group C Streptococcus. On POD #1 patient with persistent fevers to 102.5. Antimicrobials now broadened to anidulafungin and meropenem. Pharmacy has been consulted for meropenem dosing.   Plan: Meropenem 1 g q8h   Continue to monitor renal function and adjust antibiotics as indicated.  Height: 5\' 9"  (175.3 cm) Weight: (!) 211 kg (465 lb 2.7 oz) IBW/kg (Calculated) : 66.2  Temp (24hrs), Avg:99.8 F (37.7 C), Min:98 F (36.7 C), Max:102.5 F (39.2 C)  Recent Labs  Lab 11/15/19 0447 11/16/19 0534 11/17/19 0454 11/18/19 0433 11/19/19 0436  WBC 16.4* 13.8* 16.1* 20.0* 17.4*  CREATININE 0.85 0.76 0.80 0.93 0.97    Estimated Creatinine Clearance: 161.6 mL/min (by C-G formula based on SCr of 0.97 mg/dL).    Allergies  Allergen Reactions  . Penicillins Hives    Did it involve swelling of the face/tongue/throat, SOB, or low BP? No Did it involve sudden or severe rash/hives, skin peeling, or any reaction on the inside of your mouth or nose? Yes Did you need to seek medical attention at a hospital or doctor's office? No When did it last happen? If all above answers are "NO", may proceed with cephalosporin use..3  . Tomato Rash    Antimicrobials this admission: Ciprofloxacin 8/30 >> 9/9 Metronidazole 8/30 >> 9/9 Meropenem 9/9 >>  Anidulafungin 9/9 >>   Dose adjustments this admission: n/a  Microbiology results: 8/30 BCx: negative 8/30 UCx: no growth  9/3 Abscess Cx: pan-sensitive E coli +  Group C Streptococcus 9/8 MRSA PCR: negative  Thank you for allowing pharmacy to be a part of this patient's care.  11/8 11/19/2019 1:21 PM

## 2019-11-19 NOTE — Consult Note (Addendum)
PHARMACY - TOTAL PARENTERAL NUTRITION CONSULT NOTE   Indication: Prolonged ileus  Patient Measurements: Height: 5\' 9"  (175.3 cm) Weight: (!) 211 kg (465 lb 2.7 oz) IBW/kg (Calculated) : 66.2 TPN AdjBW (KG): 90.5 Body mass index is 68.69 kg/m.  Assessment: 34 year old female with PMHx of HTN admitted with concern for perforated diverticulitis with contained abscesses. Patient is POD #1 from laparoscopic lysis of adhesions and drainage of intra-abdominal abscess. There was no evidence of perforation. Procedure complicated by hypoxic respiratory failure requiring intubation. Today, patient continues to be intubated, sedated, and on mechanical ventilation in the ICU. Patient started on Levophed for pressure support.   Glucose / Insulin: BG range 86 - 125: no SSI required  Electrolytes: Hypomagnesemia (replaced) Renal: Scr 0.76 >> 0.97 LFTs / TGs: LFTs WNL at baseline, TG 69 at baseline Prealbumin / albumin: <5 / 1.7 Intake / Output; MIVF: Net + 3185L yesterday. Sodium bicarb drip at 125 mL/hr + NS at 95 mL/hr. Continue to monitor I&Os and need for intermittent Lasix as tolerated to maintain euvolemia.  GI Imaging: 9/7 CT abdomen pelvis: colonic perforation related to diverticulitis with worsening fluid and gas containing abscesses is throughout the low abdomen and pelvis many with interloop and mesenteric involvement, also with multiple areas of pelvic fluid as well showing generalized inflammation tracking to the mid abdomen. Interval decompression of the abscess in the cul-de-sac following drain placement. Extensive body wall edema.   Surgeries / Procedures:  9/8 Robotic assisted laparoscopic lysis of adhesions and drainage of intra-abdominal abscess WITHOUT evidence of perforation  Central access: 11/19/19 TPN start date: 11/19/19  Nutritional Goals (per RD recommendation on 9/7 and 9/9 --calorie goal slightly decreased but overall the same): kCal: 2600 - 2800/day, Protein: 130 -  140g/day, Fluid: > 2000 mL Goal TPN rate is 100 mL/hr (provides 140 g of protein and 2800 kcals per day)  Current Nutrition:  NPO  Propofol 60 mcg/kg/min (d/c 9/9) and transitioned to Precedex  Plan:   Start TPN at 84mL/hr at 1800 (~1/2 rate)  Nutritional components: 15.6 % dextrose, 53 g/L amino acids, 31 g/L lipids  Electrolytes in TPN: 74mEq/L of Na, 70mEq/L of K, 50mEq/L of Ca, 46mEq/L of Mg, and 60mmol/L of Phos. Cl:Ac ratio 1:2  Add standard MVI and trace elements to TPN  continue Moderate q4h SSI and adjust as needed   Reduce MIVF to 45 mL/hr and discontinue sodium bicarb drip at 1800 (fluids + TPN = 2.4L day)  Monitor TPN labs daily until stable then on Mon/Thurs. Patient is at risk for re-feeding.   12m 11/19/2019,9:25 AM

## 2019-11-19 NOTE — Progress Notes (Signed)
[facility] SURGICAL PROGRESS NOTE  Hospital Day(s): 10.   Post op day(s): 1 Day Post-Op.   Interval History:  Patient seen and examined Admitted to ICU post-operatively secondary to inability to extubate, hypoxic respiratory failure   Patient remains intubated this morning, respiratory acidosis on ABG resolved No fevers in last 24 hours, remains tachycardic Leukocytosis improved from yesterday, down to 17.4k this morning Renal function remains in normal limits, sCr - 0.97, good UO - 1.5 Hypomagnesemia to 1.5, otherwise no significant electrolyte derangements NGT placed in OR, 300 ccs out RLQ drain placed intra-operatively with 130 ccs out, serosanguinous Final cultures from initial drain placement with pan-susceptible E coli, and group C strep    Vital signs in last 24 hours: [min-max] current  Temp:  [97.8 F (36.6 C)-99.4 F (37.4 C)] 98 F (36.7 C) (09/09 0400) Pulse Rate:  [108-169] 123 (09/09 0714) Resp:  [16-35] 28 (09/09 0714) BP: (92-175)/(46-106) 95/54 (09/09 0714) SpO2:  [88 %-100 %] 98 % (09/09 0714) Arterial Line BP: (85-177)/(31-85) 93/61 (09/09 0714) FiO2 (%):  [40 %] 40 % (09/09 0232) Weight:  [211 kg] 211 kg (09/08 1237)     Height: 5\' 9"  (175.3 cm) Weight: (!) 211 kg BMI (Calculated): 68.66   Intake/Output last 2 shifts:  09/08 0701 - 09/09 0700 In: 6235.9 [I.V.:5055.7; Blood:400; IV Piggyback:780.2] Out: 2750 [Urine:1570; Drains:180; Blood:300]   Physical Exam:  Constitutional: alert, cooperative and no distress, follows commands Respiratory: Intubated Cardiovascular: Tachycardic, regular  Gastrointestinal: Obese, soft, no illicited tenderness, difficult to assess distension, no rebound/guarding, JP in RLQ with serosanguinous output Integumentary: Laparoscopic incisions are CDI with dermabond, no erythema or drainage   Labs:  CBC Latest Ref Rng & Units 11/19/2019 11/18/2019 11/18/2019  WBC 4.0 - 10.5 K/uL 17.4(H) - 20.0(H)  Hemoglobin 12.0  - 15.0 g/dL 01/18/2020) 1.6(X) 0.9(U)  Hematocrit 36 - 46 % 26.0(L) 26.5(L) 26.5(L)  Platelets 150 - 400 K/uL 347 - 397   CMP Latest Ref Rng & Units 11/19/2019 11/18/2019 11/17/2019  Glucose 70 - 99 mg/dL 01/17/2020) 409(W) 119(J)  BUN 6 - 20 mg/dL 9 5(L) 6  Creatinine 478(G - 1.00 mg/dL 9.56 2.13 0.86  Sodium 135 - 145 mmol/L 140 137 138  Potassium 3.5 - 5.1 mmol/L 4.2 3.5 3.5  Chloride 98 - 111 mmol/L 110 109 106  CO2 22 - 32 mmol/L 20(L) 24 22  Calcium 8.9 - 10.3 mg/dL 6.6(L) 7.4(L) 7.3(L)  Total Protein 6.5 - 8.1 g/dL 5.0(L) - -  Total Bilirubin 0.3 - 1.2 mg/dL 0.8 - -  Alkaline Phos 38 - 126 U/L 43 - -  AST 15 - 41 U/L 11(L) - -  ALT 0 - 44 U/L 9 - -     Imaging studies: No new pertinent imaging studies   Assessment/Plan:  34 y.o. female 1 Day Post-Op s/p robotic assisted laparoscopic lysis of adhesion and drainage of intra-abdominal abscess WITHOUT evidence of perforation or frank stool for perforated diverticulitis, complicated by hypoxic respiratory failure requiring post-operative intubation.    - Wean from ventilator per PCCM; appreciate assistance  - Continue NPO + TPN  - Continue NGT for now   - Continue IV ABx (Cipro + Flagyl); Cx from drainage reviewed, limited with Penicillin allergy  - Monitor abdominal examination; on-going bowel function - Pain control prn; antiemetics prn - Monitor leukocytosis, renal function - Home medications -Mobilization encouraged as tolerated - DVT prophylaxis; okay to resume  All of the above findings and recommendations were discussed with the  patient, and the medical team, and all of patient's questions were answered to her expressed satisfaction.  -- Lynden Oxford, PA-C [facility] 11/19/2019, 7:29 AM [phone] M-F: 7am - 4pm

## 2019-11-20 ENCOUNTER — Inpatient Hospital Stay: Payer: BC Managed Care – PPO

## 2019-11-20 DIAGNOSIS — K5792 Diverticulitis of intestine, part unspecified, without perforation or abscess without bleeding: Secondary | ICD-10-CM | POA: Diagnosis not present

## 2019-11-20 DIAGNOSIS — K651 Peritoneal abscess: Secondary | ICD-10-CM | POA: Diagnosis not present

## 2019-11-20 DIAGNOSIS — D72829 Elevated white blood cell count, unspecified: Secondary | ICD-10-CM | POA: Diagnosis not present

## 2019-11-20 LAB — CBC WITH DIFFERENTIAL/PLATELET
Abs Immature Granulocytes: 1.29 10*3/uL — ABNORMAL HIGH (ref 0.00–0.07)
Basophils Absolute: 0.1 10*3/uL (ref 0.0–0.1)
Basophils Relative: 0 %
Eosinophils Absolute: 0 10*3/uL (ref 0.0–0.5)
Eosinophils Relative: 0 %
HCT: 24 % — ABNORMAL LOW (ref 36.0–46.0)
Hemoglobin: 8.2 g/dL — ABNORMAL LOW (ref 12.0–15.0)
Immature Granulocytes: 6 %
Lymphocytes Relative: 8 %
Lymphs Abs: 2 10*3/uL (ref 0.7–4.0)
MCH: 24.6 pg — ABNORMAL LOW (ref 26.0–34.0)
MCHC: 34.2 g/dL (ref 30.0–36.0)
MCV: 72.1 fL — ABNORMAL LOW (ref 80.0–100.0)
Monocytes Absolute: 0.7 10*3/uL (ref 0.1–1.0)
Monocytes Relative: 3 %
Neutro Abs: 19.4 10*3/uL — ABNORMAL HIGH (ref 1.7–7.7)
Neutrophils Relative %: 83 %
Platelets: 319 10*3/uL (ref 150–400)
RBC: 3.33 MIL/uL — ABNORMAL LOW (ref 3.87–5.11)
RDW: 18.6 % — ABNORMAL HIGH (ref 11.5–15.5)
Smear Review: NORMAL
WBC: 23.5 10*3/uL — ABNORMAL HIGH (ref 4.0–10.5)
nRBC: 0.4 % — ABNORMAL HIGH (ref 0.0–0.2)

## 2019-11-20 LAB — TYPE AND SCREEN
ABO/RH(D): A POS
Antibody Screen: NEGATIVE
Unit division: 0
Unit division: 0
Unit division: 0

## 2019-11-20 LAB — BASIC METABOLIC PANEL
Anion gap: 9 (ref 5–15)
BUN: 19 mg/dL (ref 6–20)
CO2: 23 mmol/L (ref 22–32)
Calcium: 6.5 mg/dL — ABNORMAL LOW (ref 8.9–10.3)
Chloride: 107 mmol/L (ref 98–111)
Creatinine, Ser: 1.08 mg/dL — ABNORMAL HIGH (ref 0.44–1.00)
GFR calc Af Amer: >60 mL/min (ref >60)
GFR calc non Af Amer: >60 mL/min (ref >60)
Glucose, Bld: 188 mg/dL — ABNORMAL HIGH (ref 70–99)
Potassium: 4.1 mmol/L (ref 3.5–5.1)
Sodium: 139 mmol/L (ref 135–145)

## 2019-11-20 LAB — BPAM RBC
Blood Product Expiration Date: 202109302359
Blood Product Expiration Date: 202110022359
Blood Product Expiration Date: 202110032359
ISSUE DATE / TIME: 202109081452
Unit Type and Rh: 6200
Unit Type and Rh: 6200
Unit Type and Rh: 6200

## 2019-11-20 LAB — GLUCOSE, CAPILLARY
Glucose-Capillary: 127 mg/dL — ABNORMAL HIGH (ref 70–99)
Glucose-Capillary: 132 mg/dL — ABNORMAL HIGH (ref 70–99)
Glucose-Capillary: 143 mg/dL — ABNORMAL HIGH (ref 70–99)
Glucose-Capillary: 147 mg/dL — ABNORMAL HIGH (ref 70–99)
Glucose-Capillary: 156 mg/dL — ABNORMAL HIGH (ref 70–99)
Glucose-Capillary: 165 mg/dL — ABNORMAL HIGH (ref 70–99)

## 2019-11-20 LAB — MAGNESIUM: Magnesium: 2.1 mg/dL (ref 1.7–2.4)

## 2019-11-20 LAB — PHOSPHORUS: Phosphorus: 2.6 mg/dL (ref 2.5–4.6)

## 2019-11-20 MED ORDER — ORAL CARE MOUTH RINSE
15.0000 mL | Freq: Two times a day (BID) | OROMUCOSAL | Status: DC
  Administered 2019-11-21 – 2019-12-22 (×51): 15 mL via OROMUCOSAL

## 2019-11-20 MED ORDER — FUROSEMIDE 10 MG/ML IJ SOLN
20.0000 mg | Freq: Once | INTRAMUSCULAR | Status: AC
  Administered 2019-11-20: 20 mg via INTRAVENOUS
  Filled 2019-11-20: qty 2

## 2019-11-20 MED ORDER — TRAMADOL HCL 50 MG PO TABS
50.0000 mg | ORAL_TABLET | Freq: Four times a day (QID) | ORAL | Status: DC | PRN
  Administered 2019-11-20 – 2019-12-14 (×10): 50 mg via ORAL
  Filled 2019-11-20 (×10): qty 1

## 2019-11-20 MED ORDER — TRAVASOL 10 % IV SOLN
INTRAVENOUS | Status: AC
  Filled 2019-11-20: qty 1272

## 2019-11-20 MED ORDER — SODIUM CHLORIDE 0.9 % IV SOLN
100.0000 mg | INTRAVENOUS | Status: DC
  Administered 2019-11-20 – 2019-11-27 (×8): 100 mg via INTRAVENOUS
  Filled 2019-11-20 (×8): qty 100

## 2019-11-20 MED ORDER — ACETAMINOPHEN 500 MG PO TABS
1000.0000 mg | ORAL_TABLET | Freq: Four times a day (QID) | ORAL | Status: DC | PRN
  Administered 2019-11-29 – 2019-11-30 (×2): 1000 mg via ORAL
  Filled 2019-11-20 (×2): qty 2

## 2019-11-20 MED ORDER — FUROSEMIDE 10 MG/ML IJ SOLN
40.0000 mg | Freq: Once | INTRAMUSCULAR | Status: AC
  Administered 2019-11-20: 40 mg via INTRAVENOUS
  Filled 2019-11-20: qty 4

## 2019-11-20 MED ORDER — CHLORHEXIDINE GLUCONATE 0.12 % MT SOLN
15.0000 mL | Freq: Two times a day (BID) | OROMUCOSAL | Status: DC
  Administered 2019-11-20 – 2019-11-30 (×20): 15 mL via OROMUCOSAL
  Filled 2019-11-20 (×20): qty 15

## 2019-11-20 NOTE — Progress Notes (Signed)
[facility] SURGICAL PROGRESS NOTE  Hospital Day(s): 11.   Post op day(s): 2 Days Post-Op.   Interval History:  Patient seen and examined no acute events or new complaints overnight. She failed SBT yesterday and has remained intubated She is requiring vasopressor support, on norepinephrine at 10 mcg/min Patient more sedated this morning, on Precedex and Fentanyl  She continued to spike fevers throughout yesterday but has been afebrile since 2000 - 09/09 Unfortunately, leukocytosis has increased again this morning, now at 23.5K Slight bump in renal function, sCr - 1.08, UO - 800 No electrolyte derangements NGT with 400 ccs out Surgically placed drain with 120 ccs out, serous  ID on board yesterday, ABx switched to meropenem, added andifungalin   Vital signs in last 24 hours: [min-max] current  Temp:  [98.1 F (36.7 C)-102.5 F (39.2 C)] 100 F (37.8 C) (09/10 0400) Pulse Rate:  [90-128] 94 (09/10 0605) Resp:  [18-30] 18 (09/10 0605) BP: (81-136)/(45-98) 135/88 (09/10 0605) SpO2:  [87 %-100 %] 98 % (09/10 0605) Arterial Line BP: (102-159)/(38-75) 147/71 (09/10 0500) FiO2 (%):  [30 %-40 %] 30 % (09/10 0420)     Height: 5\' 9"  (175.3 cm) Weight: (!) 211 kg BMI (Calculated): 68.66   Intake/Output last 2 shifts:  09/09 0701 - 09/10 0700 In: 6109.8 [I.V.:5166.1; IV Piggyback:943.6] Out: 1370 [Urine:800; Emesis/NG output:450; Drains:120]   Physical Exam:  Constitutional: More sedated this morning Respiratory: Intubated Cardiovascular: Tachycardic, regular  Gastrointestinal: Obese, soft, no illicited tenderness, difficult to assess distension, no rebound/guarding, JP in RLQ with serosanguinous output Musculoskeletal: Extremities appear more edematous  Integumentary: Laparoscopic incisions are CDI with dermabond, no erythema or drainage    Labs:  CBC Latest Ref Rng & Units 11/20/2019 11/19/2019 11/18/2019  WBC 4.0 - 10.5 K/uL 23.5(H) 17.4(H) -  Hemoglobin 12.0 -  15.0 g/dL 8.2(L) 8.7(L) 8.7(L)  Hematocrit 36 - 46 % 24.0(L) 26.0(L) 26.5(L)  Platelets 150 - 400 K/uL 319 347 -   CMP Latest Ref Rng & Units 11/20/2019 11/19/2019 11/18/2019  Glucose 70 - 99 mg/dL 01/18/2020) 160(F) 093(A)  BUN 6 - 20 mg/dL 19 9 5(L)  Creatinine 355(D - 1.00 mg/dL 3.22) 0.25(K 2.70  Sodium 135 - 145 mmol/L 139 140 137  Potassium 3.5 - 5.1 mmol/L 4.1 4.2 3.5  Chloride 98 - 111 mmol/L 107 110 109  CO2 22 - 32 mmol/L 23 20(L) 24  Calcium 8.9 - 10.3 mg/dL 6.23) 6.6(L) 7.4(L)  Total Protein 6.5 - 8.1 g/dL - 5.0(L) -  Total Bilirubin 0.3 - 1.2 mg/dL - 0.8 -  Alkaline Phos 38 - 126 U/L - 43 -  AST 15 - 41 U/L - 11(L) -  ALT 0 - 44 U/L - 9 -    Imaging studies: No new pertinent imaging studies   Assessment/Plan:  34 y.o. female still intubated and requiring vasopressor support 2 Days Post-Op s/p robotic assisted laparoscopic lysis of adhesion and drainage of intra-abdominal abscess WITHOUT evidence of perforation or frank stool for perforated diverticulitis, complicated by hypoxic respiratory failure requiring post-operative intubation.    - Wean from ventilator per PCCM; appreciate assistance             - Continue NPO + TPN             - Continue NGT for now              - Continue IV ABx; switched to Meropenem, andifungalin added; ID on board   - No plans for surgical  re-intervention at this time             - Monitor abdominal examination; on-going bowel function - Pain control prn; antiemetics prn - Monitor leukocytosis, renal function - Home medications -Mobilization encouraged as tolerated - DVT prophylaxis; okay to resume   All of the above findings and recommendations were discussed with the medical team  -- Lynden Oxford, PA-C [facility] 11/20/2019, 7:31 AM [phone] M-F: 7am - 4pm

## 2019-11-20 NOTE — Consult Note (Addendum)
PHARMACY - TOTAL PARENTERAL NUTRITION CONSULT NOTE   Indication: Prolonged ileus  Patient Measurements: Height: 5\' 9"  (175.3 cm) Weight: (!) 211 kg (465 lb 2.7 oz) IBW/kg (Calculated) : 66.2 TPN AdjBW (KG): 90.5 Body mass index is 68.69 kg/m.  Assessment: 34 year old female with PMHx of HTN admitted with concern for perforated diverticulitis with contained abscesses. Patient is POD #1 from laparoscopic lysis of adhesions and drainage of intra-abdominal abscess. There was no evidence of perforation. Procedure complicated by hypoxic respiratory failure requiring intubation. Today, patient continues to be intubated, sedated, and on mechanical ventilation in the ICU. Patient started on Levophed for pressure support which is being weaned.   Glucose / Insulin: BG range 118 - 188: 8u SSI required  Electrolytes: within normal limits Renal: Scr 0.76 >> 0.97 >> 1.08 (UOP decreasing) LFTs / TGs: LFTs WNL at baseline, TG 69 at baseline Prealbumin / albumin: <5 / 1.7 Intake / Output; MIVF: Net + 10L last three days.  Continue to monitor I&Os and need for intermittent Lasix as tolerated to maintain euvolemia.  GI Imaging: 9/7 CT abdomen pelvis: colonic perforation related to diverticulitis with worsening fluid and gas containing abscesses is throughout the low abdomen and pelvis many with interloop and mesenteric involvement, also with multiple areas of pelvic fluid as well showing generalized inflammation tracking to the mid abdomen. Interval decompression of the abscess in the cul-de-sac following drain placement. Extensive body wall edema.   Surgeries / Procedures:  9/8 Robotic assisted laparoscopic lysis of adhesions and drainage of intra-abdominal abscess WITHOUT evidence of perforation  Central access: 11/19/19 TPN start date: 11/19/19  Nutritional Goals (per RD recommendation on 9/7 and 9/9 --calorie goal slightly decreased but overall the same): kCal: 2600 - 2800/day, Protein: 130 - 140g/day,  Fluid: > 2000 mL Goal TPN rate is 100 mL/hr (provides 140 g of protein and 2800 kcals per day)  Current Nutrition:  NPO  Propofol 60 mcg/kg/min (d/c 9/9) and transitioned to Precedex  Plan:   Increase TPN to 100 mL/hr at 1800 (full rate)  Nutritional components: 15.6 % dextrose, 53 g/L amino acids, 31 g/L lipids  Electrolytes in TPN: 24mEq/L of Na, 81mEq/L of K, 63mEq/L of Ca, 70mEq/L of Mg, and 34mmol/L of Phos. Cl:Ac ratio 1:1  Add standard MVI and trace elements to TPN  continue Moderate q4h SSI and adjust as needed   Discontinue MIVF at 1800 as patient will meet fluid goal with TPN at full rate  Monitor TPN labs daily until stable then on Mon/Thurs. Patient is at risk for re-feeding.   12m 11/20/2019,7:36 AM

## 2019-11-20 NOTE — Progress Notes (Signed)
CRITICAL CARE NOTE 34 yo morbidly obese AAF with complicated case of acute diverticulitis. Patient had perc drains placed last week, symptoms of fevers and abd pain persisted.Patient underwent robotic surgery 9/8  with pus pockets and lysis of adhesions, there was NO overt perforation of fecal matter or intraperitoneal soilage.  Postoperatively patient was unable to wean from the ventilator due to severe hypoventilation from morbid obesity (BMI 68) and metabolic acidosis.    SIGNIFICANT EVENTS/STUDIES: 9/8 PCCM CONTACTED FOR CONSULTATION for post op resp failure and acidosis 9/9 failed weaning trials due to increased WOB PICC line to be placed 9/10 met weaning criteria extubated with mandatory BiPAP at at bedtime and as needed during day, high risk for reintubation due to morbid obesity and obesity with alveolar hypoventilation  CC  follow up respiratory failure, patient on vent  SUBJECTIVE Patient on the ventilator, awake and alert, follows commands.  Can lift head off the pillow.  Air leak evident on deflation of ETT balloon.  Tolerating SBT.  BP (!) 89/68    Pulse (!) 112    Temp 98.8 F (37.1 C) (Axillary)    Resp 19    Ht 5' 9" (1.753 m)    Wt (!) 211 kg    LMP 11/11/2019    SpO2 96%    BMI 68.69 kg/m    I/O last 3 completed shifts: In: 03559 [I.V.:9380.3; Other:55; IV Piggyback:1143.6] Out: 2195 [Urine:1625; Emesis/NG output:450; Drains:120] No intake/output data recorded.   Estimated body mass index is 68.69 kg/m as calculated from the following:   Height as of this encounter: 5' 9" (1.753 m).   Weight as of this encounter: 211 kg.   REVIEW OF SYSTEMS Patient unable to provide complete review of systems due to mechanically ventilated status.   PHYSICAL EXAMINATION:  GENERAL: Morbidly obese woman, awake and alert, follows commands.  Comfortable on ventilator. HEAD: Normocephalic, atraumatic.  EYES: Pupils equal, round, reactive to light.  No scleral icterus.  MOUTH:  Orotracheally intubated, OG in place NECK: Supple.  Thick, trachea midline. PULMONARY: Coarse breath sounds, no other adventitious sounds. CARDIOVASCULAR: S1 and S2. Regular rate and rhythm. No murmurs, rubs, or gallops.  GASTROINTESTINAL: Soft, nontender, obese, cannot make further assessment drains in place.. MUSCULOSKELETAL: No swelling, clubbing, or edema.  NEUROLOGIC: Awake and alert, follows commands.  SKIN:intact,warm,dry.  MEDICATIONS: I have reviewed all medications and confirmed regimen as documented   CULTURE RESULTS   Recent Results (from the past 240 hour(s))  Aerobic/Anaerobic Culture (surgical/deep wound)     Status: None   Collection Time: 11/13/19  6:52 PM   Specimen: Abscess  Result Value Ref Range Status   Specimen Description   Final    ABSCESS Performed at [facility], [address], [place], [place] [address]    Special Requests   Final    Normal Performed at [facility], [address] [place], [place] [address]    Gram Stain   Final    MODERATE WBC PRESENT, PREDOMINANTLY PMN ABUNDANT GRAM NEGATIVE RODS ABUNDANT GRAM POSITIVE COCCI IN PAIRS IN CHAINS FEW GRAM POSITIVE RODS    Culture   Final    ABUNDANT ESCHERICHIA COLI STREPTOCOCCUS GROUP C Beta hemolytic streptococci are predictably susceptible to penicillin and other beta lactams. Susceptibility testing not routinely performed. NO ANAEROBES ISOLATED Performed at [facility], [place] [address], [place], [place] [address]    Report Status 11/19/2019 FINAL  Final   Organism ID, Bacteria ESCHERICHIA COLI  Final  Susceptibility   Escherichia coli - MIC*    AMPICILLIN <=2 SENSITIVE Sensitive     CEFAZOLIN <=4 SENSITIVE Sensitive     CEFEPIME <=0.12 SENSITIVE Sensitive     CEFTAZIDIME <=1 SENSITIVE Sensitive     CEFTRIAXONE <=0.25 SENSITIVE Sensitive     CIPROFLOXACIN <=0.25 SENSITIVE Sensitive     GENTAMICIN <=1 SENSITIVE Sensitive     IMIPENEM <=0.25 SENSITIVE  Sensitive     TRIMETH/SULFA <=20 SENSITIVE Sensitive     AMPICILLIN/SULBACTAM <=2 SENSITIVE Sensitive     PIP/TAZO <=4 SENSITIVE Sensitive     * ABUNDANT ESCHERICHIA COLI  MRSA PCR Screening     Status: None   Collection Time: 11/18/19  6:36 PM   Specimen: Nasopharyngeal  Result Value Ref Range Status   MRSA by PCR NEGATIVE NEGATIVE Final    Comment:        The GeneXpert MRSA Assay (FDA approved for NASAL specimens only), is one component of a comprehensive MRSA colonization surveillance program. It is not intended to diagnose MRSA infection nor to guide or monitor treatment for MRSA infections. Performed at [facility], [address] [place], [place] [address]    .     IMAGING    DG Chest Port 1 View  Result Date: 11/20/2019 CLINICAL DATA:  Acute respiratory failure with hypoxia EXAM: PORTABLE CHEST 1 VIEW COMPARISON:  11/09/2019 FINDINGS: Endotracheal to is 3 cm above the carina. Right central line tip in the SVC. NG tube is in the stomach. Cardiomegaly with vascular congestion. Suspect mild pulmonary edema. No effusions or acute bony abnormality. IMPRESSION: Cardiomegaly with vascular congestion and probable mild pulmonary edema. Electronically Signed   By: Rolm Baptise M.D.   On: 11/20/2019 02:01     Nutrition Status: Nutrition Problem: Inadequate oral intake Etiology: inability to eat Signs/Symptoms: NPO status Interventions: TPN   CBC    Component Value Date/Time   WBC 23.5 (H) 11/20/2019 0426   RBC 3.33 (L) 11/20/2019 0426   HGB 8.2 (L) 11/20/2019 0426   HCT 24.0 (L) 11/20/2019 0426   PLT 319 11/20/2019 0426   MCV 72.1 (L) 11/20/2019 0426   MCH 24.6 (L) 11/20/2019 0426   MCHC 34.2 11/20/2019 0426   RDW 18.6 (H) 11/20/2019 0426   LYMPHSABS 2.0 11/20/2019 0426   MONOABS 0.7 11/20/2019 0426   EOSABS 0.0 11/20/2019 0426   BASOSABS 0.1 11/20/2019 0426    BMP Latest Ref Rng & Units 11/20/2019 11/19/2019 11/18/2019  Glucose 70 - 99 mg/dL 188(H)  125(H) 103(H)  BUN 6 - 20 mg/dL 19 9 5(L)  Creatinine 0.44 - 1.00 mg/dL 1.08(H) 0.97 0.93  Sodium 135 - 145 mmol/L 139 140 137  Potassium 3.5 - 5.1 mmol/L 4.1 4.2 3.5  Chloride 98 - 111 mmol/L 107 110 109  CO2 22 - 32 mmol/L 23 20(L) 24  Calcium 8.9 - 10.3 mg/dL 6.5(L) 6.6(L) 7.4(L)      Indwelling Urinary Catheter continued, requirement due to   Reason to continue Indwelling Urinary Catheter strict Intake/Output monitoring for hemodynamic instability         Ventilator continued, requirement due to severe respiratory failure   Ventilator Sedation RASS 0 to -2      ASSESSMENT AND PLAN SYNOPSIS  Severe ACUTE Hypoxic and Hypercapnic Respiratory Failure with post op resp failure due to acute metabolic acidosis due to acute diverticulitis with sepsisSevere ACUTE Hypoxic and Hypercapnic Respiratory Failure Has met weaning criteria today, will proceed with extubation Mandatory BiPAP at at bedtime and as  needed during day Suspect patient has sleep apnea Transfer to bariatric bed  Morbid obesity, with obesity and alveolar hypoventilation  High suspect for SEVERE sleep apnea Trial of extubation with backup BiPAP If she requires reintubation would have to go directly to trach   ACUTE KIDNEY INJURY/Renal Failure Improving Continue to monitor Correct electrolytes Acidosis correcting   Severe sepsis/SIRS GI-acute diverticulitis with micro perforation and abscesses  S/P robotic surgery for lysis of adhesions  use vasopressors to keep MAP>65 as needed follow ABG and LA follow up cultures Continue antibiotics Status post robotic surgery to drain abscess Antibiotics: Meropenem, antifungal: Eraxis  NUTRITIONAL STATUS Nutrition Status: Nutrition Problem: Inadequate oral intake Etiology: inability to eat Signs/Symptoms: NPO status Interventions: TPN   ELECTROLYTES Mostly issues with hypocalcemia Replete calcium Adjust TPN to reflect   PROPHYLAXIS: DVT:  Enoxaparin GI: Protonix   CASE DISCUSSED IN MULTIDISCIPLINARY ROUNDS WITH ICU TEAM  Critical Care Time devoted to patient care services described in this note is 40 minutes.    Renold Don, MD [facility]   *This note was dictated using voice recognition software/Dragon.  Despite best efforts to proofread, errors can occur which can change the meaning.  Any change was purely unintentional.

## 2019-11-20 NOTE — Progress Notes (Signed)
   Date of Admission:  11/09/2019   T  Has been extubated today.  On nonrebreather mask subjective: None available  Medications:  . sodium chloride   Intravenous Once  . chlorhexidine gluconate (MEDLINE KIT)  15 mL Mouth Rinse BID  . chlorhexidine gluconate (MEDLINE KIT)  15 mL Mouth Rinse BID  . Chlorhexidine Gluconate Cloth  6 each Topical Q0600  . enoxaparin (LOVENOX) injection  40 mg Subcutaneous Q12H  . insulin aspart  0-15 Units Subcutaneous Q4H  . magnesium hydroxide  30 mL Oral Daily  . mouth rinse  15 mL Mouth Rinse 10 times per day  . pantoprazole (PROTONIX) IV  40 mg Intravenous Q12H  . polyethylene glycol  17 g Oral Daily  . sodium chloride flush  10-40 mL Intracatheter Q12H  . sodium chloride flush  5 mL Intracatheter Q8H    Objective: Vital signs in last 24 hours: Temp:  [98.1 F (36.7 C)-102.5 F (39.2 C)] 102.3 F (39.1 C) (09/10 0957) Pulse Rate:  [90-125] 101 (09/10 1100) Resp:  [0-48] 48 (09/10 1100) BP: (82-136)/(49-98) 99/63 (09/10 1100) SpO2:  [87 %-100 %] 100 % (09/10 1100) Arterial Line BP: (108-159)/(49-73) 147/71 (09/10 0500) FiO2 (%):  [28 %-30 %] 28 % (09/10 0806)  PHYSICAL EXAM:  General:extubated on NRB, drowsy  Lungs: Bilateral air entry decreased bases. Heart: Tachycardia Abdomen:  distended. edema of the abdominal wall Extremities: edematous Rt PICC, left midline Skin: No rashes or lesions. Or bruising Lymph: Cervical, supraclavicular normal. Neurologic: cannot be assessed  Lab Results Recent Labs    11/19/19 0436 11/20/19 0426  WBC 17.4* 23.5*  HGB 8.7* 8.2*  HCT 26.0* 24.0*  NA 140 139  K 4.2 4.1  CL 110 107  CO2 20* 23  BUN 9 19  CREATININE 0.97 1.08*   Liver Panel Recent Labs    11/19/19 0436  PROT 5.0*  ALBUMIN 1.7*  AST 11*  ALT 9  ALKPHOS 43  BILITOT 0.8   Studies/Results: DG Abd 1 View  Result Date: 11/18/2019 CLINICAL DATA:  NG tube placement. EXAM: ABDOMEN - 1 VIEW COMPARISON:  11/10/2019 FINDINGS: The  NG tube tip projects over the gastric antrum/pylorus. The tip is pointed distally. IMPRESSION: NG tube tip projects over the gastric antrum/pylorus. Electronically Signed   By: Constance Holster M.D.   On: 11/18/2019 19:28   DG Chest Port 1 View  Result Date: 11/20/2019 CLINICAL DATA:  Acute respiratory failure with hypoxia EXAM: PORTABLE CHEST 1 VIEW COMPARISON:  11/09/2019 FINDINGS: Endotracheal to is 3 cm above the carina. Right central line tip in the SVC. NG tube is in the stomach. Cardiomegaly with vascular congestion. Suspect mild pulmonary edema. No effusions or acute bony abnormality. IMPRESSION: Cardiomegaly with vascular congestion and probable mild pulmonary edema. Electronically Signed   By: Rolm Baptise M.D.   On: 11/20/2019 02:01     Assessment/Plan: Perforated diverticulitis with extensive intra-abdominal loop abscesses Status post laparoscopy and washout. Patient has been on ciprofloxacin and Flagyl since 11/09/2019 but continues to spike fever with leukocytosis.  Because of extensive abscess she is currently on meropenem anidulafungin.  The leukocytosis is increasing.  We will need to keep a close eye on it as she may continue to have abscesses secondary to the perforated diverticulitis.  May need repeat imaging if leukocytosis worsens   Extubated.  Morbid obesity  Discussed the management with with the pharmacist. ID will follow her peripherally this weekend.

## 2019-11-20 NOTE — Plan of Care (Signed)
Extubated to 4 lpm nasal canula. BIPAP on standby

## 2019-11-20 NOTE — Progress Notes (Signed)
Referring Physician(s): Dr. Toula Moos  Supervising Physician: Richarda Overlie  Patient Status:  [facility] - In-pt  Chief Complaint:  Abdominal pain s/p pelvic abscess drain placement on 9.3.21 with surgical washout and surgically placed drain on 9.9.21  Subjective: 34 y.o. female inpatient. History of HTN and morbid obesity, choledocholithiasis  s/p cholecystectomy in 2014. Presented to the ED At [facility] with persistent abdominal pain X 2 -3 weeks with worsening in pain X 2 days and dysuria. The patient was found to have a ruptured diverticulitis with peritoneal abscess. IR placed a pelvic abscess drain via trans gluteal approach on 9.3.21. On 9.8. 21.  The patient had robotic lysis of adhesions, washout and surgical drain placement to pelvic abscess (2 in total). Patient was transferred to ICU post procedure. Patient extubated today.  WBC is 23.5 (uptrending) Cultures from pelvic abscess  grew e. Coli and group C strep.   Patient alert and laying in bed. Patient extubated today now on [place] with slight work of breathing. Family at bedside. Patient endorses right flank pain.    Allergies: Penicillins and Tomato  Medications: Prior to Admission medications   Medication Sig Start Date End Date Taking? Authorizing Provider  losartan-hydrochlorothiazide (HYZAAR) 100-25 MG tablet Take 1 tablet by mouth daily. 03/26/19 03/25/20 Yes Willy Eddy, MD  azithromycin (ZITHROMAX Z-PAK) 250 MG tablet Take 2 tablets (500 mg) on  Day 1,  followed by 1 tablet (250 mg) once daily on Days 2 through 5. Patient not taking: Reported on 11/09/2019 12/06/17   Bridget Hartshorn L, PA-C  ondansetron (ZOFRAN) 4 MG tablet Take 1 tablet (4 mg total) by mouth daily as needed. Patient not taking: Reported on 11/09/2019 03/26/19 03/25/20  Willy Eddy, MD     Vital Signs: BP 115/79   Pulse (!) 112   Temp (!) 102.3 F (39.1 C) (Axillary)   Resp (!) 46   Ht 5\' 9"  (1.753 m)   Wt (!) 465 lb 2.7 oz (211 kg)   LMP  11/11/2019   SpO2 98%   BMI 68.69 kg/m   Physical Exam Vitals and nursing note reviewed.  Constitutional:      Appearance: She is well-developed.     Comments: Sleepy.  HENT:     Head: Normocephalic and atraumatic.  Eyes:     Conjunctiva/sclera: Conjunctivae normal.  Pulmonary:     Effort: Pulmonary effort is normal.     Comments: slight work of breathing. Patient is on [place].  Abdominal:     Tenderness: There is abdominal tenderness.     Comments: Positive  Left pelvic drain via rihgt transgluteal approach.  drain  to JP suction device. 2 ml of  Yellow brown colored fluid noted in the line to the the bulb suction device.   Musculoskeletal:     Cervical back: Normal range of motion.  Neurological:     Mental Status: She is alert and oriented to person, place, and time.     Imaging: DG Abd 1 View  Result Date: 11/18/2019 CLINICAL DATA:  NG tube placement. EXAM: ABDOMEN - 1 VIEW COMPARISON:  11/10/2019 FINDINGS: The NG tube tip projects over the gastric antrum/pylorus. The tip is pointed distally. IMPRESSION: NG tube tip projects over the gastric antrum/pylorus. Electronically Signed   By: 11/12/2019 M.D.   On: 11/18/2019 19:28   CT ABDOMEN PELVIS W CONTRAST  Addendum Date: 11/17/2019   ADDENDUM REPORT: 11/17/2019 11:48 ADDENDUM: These results were called by telephone at the time of interpretation on 11/17/2019 at  11:48 am to provider [facility] , who verbally acknowledged these results. Electronically Signed   By: Donzetta Kohut M.D.   On: 11/17/2019 11:48   Result Date: 11/17/2019 CLINICAL DATA:  Abdominal abscess/infection EXAM: CT ABDOMEN AND PELVIS WITH CONTRAST TECHNIQUE: Multidetector CT imaging of the abdomen and pelvis was performed using the standard protocol following bolus administration of intravenous contrast. CONTRAST:  OMNIPAQUE IOHEXOL 300 MG/ML  SOLN COMPARISON:  11/12/2019 FINDINGS: Lower chest: Incidental imaging of the lung bases is unremarkable.  Hepatobiliary: Signs of hepatic steatosis and lobular hepatic contours. Portal vein is patent. Hepatic veins are patent. No focal, suspicious hepatic lesion. Post cholecystectomy peer pneumobilia as on the previous exam. Pancreas: Pancreas is normal without ductal dilation or signs of inflammation. Spleen: Spleen normal in size and contour. Adrenals/Urinary Tract: Adrenal glands are normal. Symmetric renal enhancement. Low-density lesion in the upper pole of the RIGHT kidney is likely a cyst and is unchanged. No hydronephrosis. Generalized stranding throughout the pelvis about the urinary bladder in the setting of pelvic abscesses. Stomach/Bowel: Proximal small bowel dilation. Dilation is similar to the prior exam. Numerous interloop abscesses in the low abdomen. Largest area measuring 8.0 x 6.5 cm previously with gas and fluid in this area measuring approximately 4.1 x 3.9 cm. Another interloop collection in the RIGHT hemiabdomen (image 67, series 2) 5.5 x 3.8 cm, gas tracking away from this area into the LEFT lower quadrant and with gas in fluid along the LEFT anterior abdomen. Gas containing collection in the LEFT anterior abdomen measuring 8.1 x 3.7 cm, more gas in this area perhaps better organized collection and on the prior study where it measured approximately 7.9 x 3.8 cm. Signs of colonic perforation with adjacent collection, clear communication between the colonic lumen in adjacent collections in the lower abdomen is seen on image 75 of series 2 with an adjacent gas and fluid containing collection in the central pelvis (image 71, series 2) this measures approximately 4 x 2.6 cm as compared to 3.5 cm in greatest dimension on the prior study. Generalized stranding throughout the pelvis. Additional fluid which layers inferiorly, mixed with gas and extending atop the urinary bladder and from LEFT to RIGHT across the lower abdomen and pelvis (image 77, series 2) dominant collection in this area which extends  inferiorly with horseshoe shaped when viewed in coronal view measuring approximately 6.6 x 5.0 cm with smaller component extending towards the RIGHT abdomen over the urinary bladder and uterine fundus. Vascular/Lymphatic: Scattered lymph nodes throughout the retroperitoneum similar to the prior exam. No signs of atherosclerotic changes. Portal vein is patent. Scattered small lymph nodes also throughout the pelvis in the setting of diffuse inflammation. Reproductive: Uterus surrounded by inflammation and pelvic fluid collections. Other: Extensive body wall edema. Generalized inflammation in the pelvis and lower abdomen. Pelvic drain in place, interval decompression of the abscess in the cul-de-sac. Musculoskeletal: No acute bone finding. No destructive bone process. IMPRESSION: 1. Signs of colonic perforation related to diverticulitis with worsening fluid and gas containing abscesses is throughout the low abdomen and pelvis many with interloop and mesenteric involvement, also with multiple areas of pelvic fluid as well showing generalized inflammation tracking to the mid abdomen. 2. Interval decompression of the abscess in the cul-de-sac following drain placement. 3. Signs of hepatic steatosis and lobular hepatic contours. 4. Post cholecystectomy with pneumobilia as on the previous exam. 5. Extensive body wall edema. 6. Aortic atherosclerosis. These results will be called to the ordering clinician or representative  by the Radiologist Assistant, and communication documented in the PACS or [employer]. Aortic Atherosclerosis (ICD10-I70.0). Electronically Signed: By: Donzetta Kohut M.D. On: 11/17/2019 11:31   DG Chest Port 1 View  Result Date: 11/20/2019 CLINICAL DATA:  Acute respiratory failure with hypoxia EXAM: PORTABLE CHEST 1 VIEW COMPARISON:  11/09/2019 FINDINGS: Endotracheal to is 3 cm above the carina. Right central line tip in the SVC. NG tube is in the stomach. Cardiomegaly with vascular congestion.  Suspect mild pulmonary edema. No effusions or acute bony abnormality. IMPRESSION: Cardiomegaly with vascular congestion and probable mild pulmonary edema. Electronically Signed   By: Charlett Nose M.D.   On: 11/20/2019 02:01   [place] EKG SITE RITE  Result Date: 11/17/2019 If Site Rite image not attached, placement could not be confirmed due to current cardiac rhythm.   Labs:  CBC: Recent Labs    11/17/19 0454 11/17/19 0454 11/18/19 0433 11/18/19 1405 11/19/19 0436 11/20/19 0426  WBC 16.1*  --  20.0*  --  17.4* 23.5*  HGB 8.4*   < > 8.4* 8.7* 8.7* 8.2*  HCT 26.8*   < > 26.5* 26.5* 26.0* 24.0*  PLT 380  --  397  --  347 319   < > = values in this interval not displayed.    COAGS: Recent Labs    11/09/19 1758  INR 1.1  APTT 30    BMP: Recent Labs    11/17/19 0454 11/18/19 0433 11/19/19 0436 11/20/19 0426  NA 138 137 140 139  K 3.5 3.5 4.2 4.1  CL 106 109 110 107  CO2 22 24 20* 23  GLUCOSE 108* 103* 125* 188*  BUN 6 5* 9 19  CALCIUM 7.3* 7.4* 6.6* 6.5*  CREATININE 0.80 0.93 0.97 1.08*  GFRNONAA >60 >60 >60 >60  GFRAA >60 >60 >60 >60    LIVER FUNCTION TESTS: Recent Labs    11/09/19 1601 11/16/19 0534 11/19/19 0436  BILITOT 1.0 0.5 0.8  AST 21 13* 11*  ALT 30 12 9   ALKPHOS 105 71 43  PROT 8.6* 5.9* 5.0*  ALBUMIN 3.5 2.0* 1.7*    Assessment and Plan:  34 y.o. female inpatient. History of HTN and morbid obesity, choledocholithiasis  s/p cholecystectomy in 2014. Presented to the ED At [facility] with persistent abdominal pain X 2 -3 weeks with worsening in pain X 2 days and dysuria. The patient was found to have a ruptured diverticulitis with peritoneal abscess. IR placed a pelvic abscess drain via trans gluteal approach on 9.3.21. On 9.8. 21.  The patient had robotic lysis of adhesions, washout and surgical drain placement to pelvic abscess (2 in total). Patient was transferred to ICU post procedure. Patient extubated today.  WBC is 23.5 (uptrending) Cultures from  pelvic abscess  grew e. Coli and group C strep.   Per Epic output is: 0 ml, 50 ml, 30 ml, 75 ml.  Recommend team continue with flushing TID, output recording q shift and dressing changes as needed. Would consider additional imaging when output is less than 10 ml for 24 hours not including flush material.   Continue current treatment plans as per surgery.   Electronically Signed: 11.3.21, NP 11/20/2019, 3:33 PM   I spent a total of 15 Minutes at the patient's bedside AND on the patient's hospital floor or unit, greater than 50% of which was counseling/coordinating care for pelvic abscess drain.

## 2019-11-21 DIAGNOSIS — E662 Morbid (severe) obesity with alveolar hypoventilation: Secondary | ICD-10-CM

## 2019-11-21 LAB — BLOOD GAS, ARTERIAL
Acid-Base Excess: 1.9 mmol/L (ref 0.0–2.0)
Acid-Base Excess: 2.9 mmol/L — ABNORMAL HIGH (ref 0.0–2.0)
Allens test (pass/fail): POSITIVE — AB
Bicarbonate: 27.7 mmol/L (ref 20.0–28.0)
Bicarbonate: 27.9 mmol/L (ref 20.0–28.0)
Delivery systems: POSITIVE
Delivery systems: POSITIVE
Expiratory PAP: 8
Expiratory PAP: 8
FIO2: 0.3
FIO2: 0.28
Inspiratory PAP: 15
Inspiratory PAP: 16
Mechanical Rate: 12
O2 Saturation: 94.1 %
O2 Saturation: 94.8 %
Patient temperature: 37
Patient temperature: 37
RATE: 12 resp/min
pCO2 arterial: 49 mmHg — ABNORMAL HIGH (ref 32.0–48.0)
pCO2 arterial: 44 mmHg (ref 32.0–48.0)
pH, Arterial: 7.36 (ref 7.350–7.450)
pH, Arterial: 7.41 (ref 7.350–7.450)
pO2, Arterial: 74 mmHg — ABNORMAL LOW (ref 83.0–108.0)
pO2, Arterial: 74 mmHg — ABNORMAL LOW (ref 83.0–108.0)

## 2019-11-21 LAB — MAGNESIUM: Magnesium: 2.1 mg/dL (ref 1.7–2.4)

## 2019-11-21 LAB — CBC WITH DIFFERENTIAL/PLATELET
Abs Immature Granulocytes: 1.1 10*3/uL — ABNORMAL HIGH (ref 0.00–0.07)
Basophils Absolute: 0.1 10*3/uL (ref 0.0–0.1)
Basophils Relative: 0 %
Eosinophils Absolute: 0.1 10*3/uL (ref 0.0–0.5)
Eosinophils Relative: 0 %
HCT: 22.5 % — ABNORMAL LOW (ref 36.0–46.0)
Hemoglobin: 7.6 g/dL — ABNORMAL LOW (ref 12.0–15.0)
Immature Granulocytes: 6 %
Lymphocytes Relative: 6 %
Lymphs Abs: 1.2 10*3/uL (ref 0.7–4.0)
MCH: 28 pg (ref 26.0–34.0)
MCHC: 33.8 g/dL (ref 30.0–36.0)
MCV: 83 fL (ref 80.0–100.0)
Monocytes Absolute: 0.8 10*3/uL (ref 0.1–1.0)
Monocytes Relative: 4 %
Neutro Abs: 16.4 10*3/uL — ABNORMAL HIGH (ref 1.7–7.7)
Neutrophils Relative %: 84 %
Platelets: 236 10*3/uL (ref 150–400)
RBC: 2.71 MIL/uL — ABNORMAL LOW (ref 3.87–5.11)
RDW: 21.8 % — ABNORMAL HIGH (ref 11.5–15.5)
Smear Review: NORMAL
WBC: 19.7 10*3/uL — ABNORMAL HIGH (ref 4.0–10.5)
nRBC: 0.4 % — ABNORMAL HIGH (ref 0.0–0.2)

## 2019-11-21 LAB — GLUCOSE, CAPILLARY
Glucose-Capillary: 147 mg/dL — ABNORMAL HIGH (ref 70–99)
Glucose-Capillary: 147 mg/dL — ABNORMAL HIGH (ref 70–99)
Glucose-Capillary: 174 mg/dL — ABNORMAL HIGH (ref 70–99)
Glucose-Capillary: 192 mg/dL — ABNORMAL HIGH (ref 70–99)
Glucose-Capillary: 199 mg/dL — ABNORMAL HIGH (ref 70–99)
Glucose-Capillary: 209 mg/dL — ABNORMAL HIGH (ref 70–99)

## 2019-11-21 LAB — BASIC METABOLIC PANEL WITH GFR
Anion gap: 8 (ref 5–15)
BUN: 20 mg/dL (ref 6–20)
CO2: 27 mmol/L (ref 22–32)
Calcium: 6.9 mg/dL — ABNORMAL LOW (ref 8.9–10.3)
Chloride: 106 mmol/L (ref 98–111)
Creatinine, Ser: 0.87 mg/dL (ref 0.44–1.00)
GFR calc Af Amer: >60 mL/min (ref >60)
GFR calc non Af Amer: >60 mL/min (ref >60)
Glucose, Bld: 166 mg/dL — ABNORMAL HIGH (ref 70–99)
Potassium: 4 mmol/L (ref 3.5–5.1)
Sodium: 141 mmol/L (ref 135–145)

## 2019-11-21 LAB — PROCALCITONIN: Procalcitonin: 2.27 ng/mL

## 2019-11-21 LAB — PREPARE RBC (CROSSMATCH)

## 2019-11-21 LAB — PHOSPHORUS: Phosphorus: 3.1 mg/dL (ref 2.5–4.6)

## 2019-11-21 MED ORDER — LORAZEPAM 2 MG/ML IJ SOLN
INTRAMUSCULAR | Status: AC
  Filled 2019-11-21: qty 1

## 2019-11-21 MED ORDER — LORAZEPAM 2 MG/ML IJ SOLN
2.0000 mg | Freq: Once | INTRAMUSCULAR | Status: AC
  Administered 2019-11-21: 2 mg via INTRAVENOUS

## 2019-11-21 MED ORDER — TRAVASOL 10 % IV SOLN
INTRAVENOUS | Status: AC
  Filled 2019-11-21: qty 1272

## 2019-11-21 NOTE — Progress Notes (Signed)
[facility] SURGICAL PROGRESS NOTE  Hospital Day(s): 12.   Post op day(s): 3 Days Post-Op.   Interval History:  Patient seen and examined Extubated to BiPAP yesterday.  Off of pressors, but remains tachycardic and tachypneic. She is much more alert today and interactive. States abdomen is "OK. A little sore." Slight improvement in leukocytosis to 19.7 Renal function back to normal. No electrolyte derangements NGT with 850 ccs out Surgically placed drain with 40 ccs out, serous  Continues on meropenem and anidulafungin  Vital signs in last 24 hours: [min-max] current  Temp:  [98.3 F (36.8 C)-99.5 F (37.5 C)] 99.1 F (37.3 C) (09/11 1227) Pulse Rate:  [110-142] 139 (09/11 1227) Resp:  [12-46] 43 (09/11 1227) BP: (89-160)/(65-98) 140/89 (09/11 1200) SpO2:  [77 %-99 %] 90 % (09/11 1227) FiO2 (%):  [28 %] 28 % (09/11 1227)     Height: 5\' 9"  (175.3 cm) Weight: (!) 211 kg BMI (Calculated): 68.66   Intake/Output last 2 shifts:  09/10 0701 - 09/11 0700 In: 4469.2 [I.V.:4214.2; IV Piggyback:200] Out: 3415 [Urine:2525; Emesis/NG output:850; Drains:40]   Physical Exam:  Constitutional: More sedated this morning Respiratory: Currently off of positive pressure support. Cardiovascular: Tachycardic, regular  Gastrointestinal: Obese, soft, no illicited tenderness, difficult to assess distension, no rebound/guarding, JP in RLQ with serosanguinous output Musculoskeletal: Extremities appear more edematous  Integumentary: Laparoscopic incisions are CDI with dermabond, no erythema or drainage    Labs:  CBC Latest Ref Rng & Units 11/21/2019 11/20/2019 11/19/2019  WBC 4.0 - 10.5 K/uL 19.7(H) 23.5(H) 17.4(H)  Hemoglobin 12.0 - 15.0 g/dL 7.6(L) 8.2(L) 8.7(L)  Hematocrit 36 - 46 % 22.5(L) 24.0(L) 26.0(L)  Platelets 150 - 400 K/uL 236 319 347   CMP Latest Ref Rng & Units 11/21/2019 11/20/2019 11/19/2019  Glucose 70 - 99 mg/dL 01/19/2020) 600(K) 599(H)  BUN 6 - 20 mg/dL 20 19 9    Creatinine 0.44 - 1.00 mg/dL 741(S ) 2.39  Sodium 135 - 145 mmol/L 141 139 140  Potassium 3.5 - 5.1 mmol/L 4.0 4.1 4.2  Chloride 98 - 111 mmol/L 106 107 110  CO2 22 - 32 mmol/L 27 23 20(L)  Calcium 8.9 - 10.3 mg/dL 6.9(L) 6.5(L) 6.6(L)  Total Protein 6.5 - 8.1 g/dL - - 5.0(L)  Total Bilirubin 0.3 - 1.2 mg/dL - - 0.8  Alkaline Phos 38 - 126 U/L - - 43  AST 15 - 41 U/L - - 11(L)  ALT 0 - 44 U/L - - 9    Imaging studies: No new pertinent imaging studies   Assessment/Plan:  34 y.o. female now extubated and off of vasopressors 3 Days Post-Op s/p robotic assisted laparoscopic lysis of adhesion and drainage of intra-abdominal abscess WITHOUT evidence of perforation or frank stool for perforated diverticulitis, complicated by hypoxic respiratory failure requiring post-operative intubation.    - At high risk for re-intubation; per PCCM, will need trach if this occurs.             - Continue NPO + TPN             - Continue NGT for now              - Continue IV ABx   - No plans for surgical re-intervention at this time             - Monitor abdominal examination; on-going bowel function - Pain control prn; antiemetics prn - Monitor leukocytosis, renal function - Home medications -Mobilization encouraged as tolerated - DVT  prophylaxis; okay to resume   All of the above findings and recommendations were discussed with the medical team.

## 2019-11-21 NOTE — Progress Notes (Signed)
CRITICAL CARE NOTE 34 yo morbidly obese AAF with complicated case of acute diverticulitis. Patient had perc drains placed last week, symptoms of fevers and abd pain persisted.Patient underwent robotic surgery 9/8  with pus pockets and lysis of adhesions, there was NO overt perforation of fecal matter or intraperitoneal soilage.  Postoperatively patient was unable to wean from the ventilator due to severe hypoventilation from morbid obesity (BMI 68) and metabolic acidosis.    SIGNIFICANT EVENTS/STUDIES: 9/8 PCCM CONTACTED FOR CONSULTATION for post op resp failure and acidosis 9/9 failed weaning trials due to increased WOB PICC line to be placed 9/10 met weaning criteria extubated with mandatory BiPAP at at bedtime and as needed during day, high risk for reintubation due to morbid obesity and obesity with alveolar hypoventilation  CC  follow up respiratory failure, obesity with alveolar hypoventilation  SUBJECTIVE Cough with tenacious secretions.  Required BiPAP overnight.  Currently on BiPAP.  Occasionally tachypneic suspect this mostly due to body habitus.  Tachycardic.  Still with positive I's and O's.  REVIEW OF SYSTEMS A 10 point review of systems was performed and it is as noted above otherwise negative.  BP (!) 138/97   Pulse (!) 130   Temp 98.8 F (37.1 C)   Resp (!) 44   Ht _0  (1.753 m)   Wt (!) 211 kg   LMP 11/11/2019   SpO2 94%   BMI 68.69 kg/m   Intake/Output Summary (Last 24 hours) at 11/21/2019 0945 Last data filed at 11/21/2019 0920 Gross per 24 hour  Intake 4469.2 ml  Output 3815 ml  Net 654.2 ml   I/O last 3 completed shifts: In: 4469.2 [I.V.:4214.2; Other:55; IV [id number]:462] Out: 8638 [TRRNH:6579; Emesis/NG output:1100; Drains:70] Total I/O In: -  Out: 400 [Urine:400]   Estimated body mass index is 68.69 kg/m as calculated from the following:   Height as of this encounter: _1  (1.753 m).   Weight as of this encounter: 211 kg.  PHYSICAL  EXAMINATION:  GENERAL: Morbidly obese woman, awake and alert, follows commands.  HEAD: Normocephalic, atraumatic.  EYES: Pupils equal, round, reactive to light.  No scleral icterus.  MOUTH: BiPAP mask in place. NECK: Supple.  Thick, trachea midline. PULMONARY: Coarse breath sounds, no other adventitious sounds. CARDIOVASCULAR: S1 and S2.  Tachycardic rate with regular rhythm. No murmurs, rubs, or gallops.  GASTROINTESTINAL: Soft, nontender, obese, cannot make further assessment drains in place.. MUSCULOSKELETAL: No swelling, clubbing, or edema.  NEUROLOGIC: Awake and alert, follows commands.  SKIN:intact,warm,dry.  Scheduled Meds: . sodium chloride   Intravenous Once  . chlorhexidine  15 mL Mouth Rinse BID  . Chlorhexidine Gluconate Cloth  6 each Topical Q0600  . enoxaparin (LOVENOX) injection  40 mg Subcutaneous Q12H  . insulin aspart  0-15 Units Subcutaneous Q4H  . magnesium hydroxide  30 mL Oral Daily  . mouth rinse  15 mL Mouth Rinse q12n4p  . pantoprazole (PROTONIX) IV  40 mg Intravenous Q12H  . polyethylene glycol  17 g Oral Daily  . sodium chloride flush  10-40 mL Intracatheter Q12H  . sodium chloride flush  5 mL Intracatheter Q8H   Continuous Infusions: . sodium chloride Stopped (11/19/19 1354)  . sodium chloride    . anidulafungin 100 mg (11/20/19 1751)  . meropenem (MERREM) IV 1 g (11/21/19 0383)  . norepinephrine (LEVOPHED) Adult infusion 10 mcg/min (11/20/19 0433)  . TPN ADULT (ION) 100 mL/hr at 11/20/19 1755  . TPN ADULT (ION)     PRN Meds:.sodium chloride, acetaminophen, alum &  mag hydroxide-simeth, HYDROmorphone (DILAUDID) injection, ibuprofen, methocarbamol, ondansetron **OR** ondansetron (ZOFRAN) IV, oxyCODONE, promethazine, sodium chloride flush, sodium chloride flush, traMADol    CULTURE RESULTS   Recent Results (from the past 240 hour(s))  Aerobic/Anaerobic Culture (surgical/deep wound)     Status: None   Collection Time: 11/13/19  6:52 PM   Specimen:  Abscess  Result Value Ref Range Status   Specimen Description   Final    ABSCESS Performed at [facility], [address] [place], [place] [address]    Special Requests   Final    Normal Performed at [facility], [address] [place], [place] [address]    Gram Stain   Final    MODERATE WBC PRESENT, PREDOMINANTLY PMN ABUNDANT GRAM NEGATIVE RODS ABUNDANT GRAM POSITIVE COCCI IN PAIRS IN CHAINS FEW GRAM POSITIVE RODS    Culture   Final    ABUNDANT ESCHERICHIA COLI STREPTOCOCCUS GROUP C Beta hemolytic streptococci are predictably susceptible to penicillin and other beta lactams. Susceptibility testing not routinely performed. NO ANAEROBES ISOLATED Performed at [facility], [place] [address] [place], [place] [address]    Report Status 11/19/2019 FINAL  Final   Organism ID, Bacteria ESCHERICHIA COLI  Final      Susceptibility   Escherichia coli - MIC*    AMPICILLIN <=2 SENSITIVE Sensitive     CEFAZOLIN <=4 SENSITIVE Sensitive     CEFEPIME <=0.12 SENSITIVE Sensitive     CEFTAZIDIME <=1 SENSITIVE Sensitive     CEFTRIAXONE <=0.25 SENSITIVE Sensitive     CIPROFLOXACIN <=0.25 SENSITIVE Sensitive     GENTAMICIN <=1 SENSITIVE Sensitive     IMIPENEM <=0.25 SENSITIVE Sensitive     TRIMETH/SULFA <=20 SENSITIVE Sensitive     AMPICILLIN/SULBACTAM <=2 SENSITIVE Sensitive     PIP/TAZO <=4 SENSITIVE Sensitive     * ABUNDANT ESCHERICHIA COLI  MRSA PCR Screening     Status: None   Collection Time: 11/18/19  6:36 PM   Specimen: Nasopharyngeal  Result Value Ref Range Status   MRSA by PCR NEGATIVE NEGATIVE Final    Comment:        The GeneXpert MRSA Assay (FDA approved for NASAL specimens only), is one component of a comprehensive MRSA colonization surveillance program. It is not intended to diagnose MRSA infection nor to guide or monitor treatment for MRSA infections. Performed at [facility], [address] [place], [place] [address]     . Results for orders placed or performed during the hospital encounter of 11/09/19 (from the past 24 hour(s))  Glucose, capillary     Status: Abnormal   Collection Time: 11/20/19 11:18 AM  Result Value Ref Range   Glucose-Capillary 143 (H) 70 - 99 mg/dL  Glucose, capillary     Status: Abnormal   Collection Time: 11/20/19  4:20 PM  Result Value Ref Range   Glucose-Capillary 132 (H) 70 - 99 mg/dL  Glucose, capillary     Status: Abnormal   Collection Time: 11/20/19  7:58 PM  Result Value Ref Range   Glucose-Capillary 147 (H) 70 - 99 mg/dL  Glucose, capillary     Status: Abnormal   Collection Time: 11/20/19 11:43 PM  Result Value Ref Range   Glucose-Capillary 156 (H) 70 - 99 mg/dL  Glucose, capillary     Status: Abnormal   Collection Time: 11/21/19  4:34 AM  Result Value Ref Range   Glucose-Capillary 147 (H) 70 - 99 mg/dL  Blood gas, arterial     Status: Abnormal  Collection Time: 11/21/19  5:30 AM  Result Value Ref Range   FIO2 0.30    Delivery systems BILEVEL POSITIVE AIRWAY PRESSURE    Inspiratory PAP 15    Expiratory PAP 8    pH, Arterial 7.36 7.35 - 7.45   pCO2 arterial 49 (H) 32 - 48 mmHg   pO2, Arterial 74 (L) 83 - 108 mmHg   Bicarbonate 27.7 20.0 - 28.0 mmol/L   Acid-Base Excess 1.9 0.0 - 2.0 mmol/L   O2 Saturation 94.1 %   Patient temperature 37.0    Collection site LEFT RADIAL    Sample type ARTERIAL DRAW    Allens test (pass/fail) PASS PASS   Mechanical Rate 12   Basic metabolic panel     Status: Abnormal   Collection Time: 11/21/19  6:16 AM  Result Value Ref Range   Sodium 141 135 - 145 mmol/L   Potassium 4.0 3.5 - 5.1 mmol/L   Chloride 106 98 - 111 mmol/L   CO2 27 22 - 32 mmol/L   Glucose, Bld 166 (H) 70 - 99 mg/dL   BUN 20 6 - 20 mg/dL   Creatinine, Ser 0.87 0.44 - 1.00 mg/dL   Calcium 6.9 (L) 8.9 - 10.3 mg/dL   GFR calc non Af Amer >60 >60 mL/min   GFR calc Af Amer >60 >60 mL/min   Anion gap 8 5 - 15  Phosphorus     Status: None   Collection  Time: 11/21/19  6:16 AM  Result Value Ref Range   Phosphorus 3.1 2.5 - 4.6 mg/dL  Magnesium     Status: None   Collection Time: 11/21/19  6:16 AM  Result Value Ref Range   Magnesium 2.1 1.7 - 2.4 mg/dL  Glucose, capillary     Status: Abnormal   Collection Time: 11/21/19  7:31 AM  Result Value Ref Range   Glucose-Capillary 147 (H) 70 - 99 mg/dL  Blood gas, arterial     Status: Abnormal   Collection Time: 11/21/19  8:27 AM  Result Value Ref Range   FIO2 0.28    Delivery systems BILEVEL POSITIVE AIRWAY PRESSURE    LHR 12 resp/min   Inspiratory PAP 16    Expiratory PAP 8.0    pH, Arterial 7.41 7.35 - 7.45   pCO2 arterial 44 32 - 48 mmHg   pO2, Arterial 74 (L) 83 - 108 mmHg   Bicarbonate 27.9 20.0 - 28.0 mmol/L   Acid-Base Excess 2.9 (H) 0.0 - 2.0 mmol/L   O2 Saturation 94.8 %   Patient temperature 37.0    Collection site LEFT RADIAL    Sample type ARTERIAL DRAW    Allens test (pass/fail) POSITIVE (A) PASS        IMAGING   No results found.   Nutrition Status: Nutrition Problem: Inadequate oral intake Etiology: inability to eat Signs/Symptoms: NPO status Interventions: TPN   CBC    Component Value Date/Time   WBC 23.5 (H) 11/20/2019 0426   RBC 3.33 (L) 11/20/2019 0426   HGB 8.2 (L) 11/20/2019 0426   HCT 24.0 (L) 11/20/2019 0426   PLT 319 11/20/2019 0426   MCV 72.1 (L) 11/20/2019 0426   MCH 24.6 (L) 11/20/2019 0426   MCHC 34.2 11/20/2019 0426   RDW 18.6 (H) 11/20/2019 0426   LYMPHSABS 2.0 11/20/2019 0426   MONOABS 0.7 11/20/2019 0426   EOSABS 0.0 11/20/2019 0426   BASOSABS 0.1 11/20/2019 0426    BMP Latest Ref Rng & Units 11/21/2019 11/20/2019 11/19/2019  Glucose 70 - 99 mg/dL 166(H) 188(H) 125(H)  BUN 6 - 20 mg/dL _0 Creatinine 0.44 - 1.00 mg/dL 0.87 1.08(H) 0.97  Sodium 135 - 145 mmol/L 141 139 140  Potassium 3.5 - 5.1 mmol/L 4.0 4.1 4.2  Chloride 98 - 111 mmol/L 106 107 110  CO2 22 - 32 mmol/L 27 23 20(L)  Calcium 8.9 - 10.3 mg/dL 6.9(L) 6.5(L)  6.6(L)      Indwelling Urinary Catheter continued, requirement due to   Reason to continue Indwelling Urinary Catheter strict Intake/Output monitoring for hemodynamic instability         Ventilator Extubated 9/10   Ventilator Sedation N/A      ASSESSMENT AND PLAN SYNOPSIS  Severe ACUTE Hypoxic and Hypercapnic Respiratory Failure with post op resp failure due to acute metabolic acidosis due to acute diverticulitis with sepsisSevere ACUTE Hypoxic and Hypercapnic Respiratory Failure Tolerated extubation Mandatory BiPAP at at bedtime and as needed during day Suspect patient has SEVERE sleep apnea Transferred to bariatric bed Start mobilizing when feasible Super morbid obesity will make mobility difficult  Morbid obesity, with obesity and alveolar hypoventilation  High suspect for SEVERE sleep apnea Continue mandatory BiPAP at at bedtime and as needed during day If she requires reintubation would have to go directly to trach  ACUTE KIDNEY INJURY/Renal Failure Resolved Continue to monitor Correct electrolytes Acidosis corrected  Severe sepsis/SIRS GI-acute diverticulitis with micro perforation and abscesses  S/P robotic surgery for lysis of adhesions  use vasopressors to keep MAP>65 as needed follow ABG and LA, trend procalcitonin Cultures showing E. coli and Streptococcus Continue antibiotics/antifungal Antibiotics: Meropenem, antifungal: Eraxis  NUTRITIONAL STATUS Nutrition Status: Nutrition Problem: Inadequate oral intake Etiology: inability to eat Signs/Symptoms: NPO status Interventions: TPN   ELECTROLYTES Mostly issues with hypocalcemia Replete calcium Adjust TPN to reflect Pharmacy following   PROPHYLAXIS: DVT: Enoxaparin GI: Protonix  Care coordination done with bedside nurse.  Discussed with Dr. Celine Ahr.  Renold Don, MD [facility]   *This note was dictated using voice recognition software/Dragon.  Despite best efforts to proofread,  errors can occur which can change the meaning.  Any change was purely unintentional.

## 2019-11-21 NOTE — Consult Note (Signed)
PHARMACY - TOTAL PARENTERAL NUTRITION CONSULT NOTE   Indication: Prolonged ileus  Patient Measurements: Height: '5\' 9"'  (175.3 cm) Weight: (!) 211 kg (465 lb 2.7 oz) IBW/kg (Calculated) : 66.2 TPN AdjBW (KG): 90.5 Body mass index is 68.69 kg/m.  Assessment: 34 year old female with PMHx of HTN admitted with concern for perforated diverticulitis with contained abscesses. Patient is POD #1 from laparoscopic lysis of adhesions and drainage of intra-abdominal abscess. There was no evidence of perforation. Procedure complicated by hypoxic respiratory failure requiring intubation. Today, patient continues to be intubated, sedated, and on mechanical ventilation in the ICU. Patient started on Levophed for pressure support which is being weaned.   Glucose / Insulin: BG range 118 - 188: 8u SSI required  Electrolytes: within normal limits Renal: Scr 0.76 >> 0.97 >> 1.08 (UOP decreasing) LFTs / TGs: LFTs WNL at baseline, TG 69 at baseline Prealbumin / albumin: <5 / 1.7 Intake / Output; MIVF: Net + 10L last three days.  Continue to monitor I&Os and need for intermittent Lasix as tolerated to maintain euvolemia.  GI Imaging: 9/7 CT abdomen pelvis: colonic perforation related to diverticulitis with worsening fluid and gas containing abscesses is throughout the low abdomen and pelvis many with interloop and mesenteric involvement, also with multiple areas of pelvic fluid as well showing generalized inflammation tracking to the mid abdomen. Interval decompression of the abscess in the cul-de-sac following drain placement. Extensive body wall edema.   Surgeries / Procedures:  9/8 Robotic assisted laparoscopic lysis of adhesions and drainage of intra-abdominal abscess WITHOUT evidence of perforation  Central access: 11/19/19 TPN start date: 11/19/19  Nutritional Goals (per RD recommendation on 9/7 and 9/9 --calorie goal slightly decreased but overall the same): kCal: 2600 - 2800/day, Protein: 130 - 140g/day,  Fluid: > 2000 mL Goal TPN rate is 100 mL/hr (provides 140 g of protein and 2800 kcals per day)  Current Nutrition:  NPO  Propofol 60 mcg/kg/min (d/c 9/9) and transitioned to Precedex  Plan:   Continue TPN @ 100 mL/hr at 1800 (full rate)  Nutritional components: 15.6 % dextrose, 53 g/L amino acids, 31 g/L lipids  Electrolytes in TPN: 48mq/L of Na, 573m/L of K, 51m151mL of Ca, 51mE50m of Mg, and 151mm69m of Phos. Cl:Ac ratio 1:1  Add standard MVI and trace elements to TPN  continue Moderate q4h SSI and adjust as needed   Fluid goal met with TPN at full rate  Monitor TPN labs daily until stable then on Mon/Thurs. Patient is at risk for re-feeding.   CharlLu DuffelrmD, BCPS Clinical Pharmacist 11/21/2019 7:26 AM

## 2019-11-21 NOTE — Progress Notes (Signed)
Pain has improved over the night, at the beginning of the shift she required 1 mg of dilaudid every 2 hours, now she states that she has some pain but that it is not that bad. NG tube to Low intermittent suction has helped greatly.

## 2019-11-22 DIAGNOSIS — J9602 Acute respiratory failure with hypercapnia: Secondary | ICD-10-CM

## 2019-11-22 LAB — GLUCOSE, CAPILLARY
Glucose-Capillary: 199 mg/dL — ABNORMAL HIGH (ref 70–99)
Glucose-Capillary: 200 mg/dL — ABNORMAL HIGH (ref 70–99)
Glucose-Capillary: 209 mg/dL — ABNORMAL HIGH (ref 70–99)
Glucose-Capillary: 214 mg/dL — ABNORMAL HIGH (ref 70–99)
Glucose-Capillary: 234 mg/dL — ABNORMAL HIGH (ref 70–99)
Glucose-Capillary: 239 mg/dL — ABNORMAL HIGH (ref 70–99)

## 2019-11-22 LAB — BLOOD GAS, ARTERIAL
Acid-Base Excess: 6.9 mmol/L — ABNORMAL HIGH (ref 0.0–2.0)
Bicarbonate: 33.3 mmol/L — ABNORMAL HIGH (ref 20.0–28.0)
FIO2: 0.32
O2 Saturation: 93.6 %
Patient temperature: 37
pCO2 arterial: 59 mmHg — ABNORMAL HIGH (ref 32.0–48.0)
pH, Arterial: 7.36 (ref 7.350–7.450)
pO2, Arterial: 72 mmHg — ABNORMAL LOW (ref 83.0–108.0)

## 2019-11-22 LAB — BASIC METABOLIC PANEL
Anion gap: 5 (ref 5–15)
BUN: 16 mg/dL (ref 6–20)
CO2: 30 mmol/L (ref 22–32)
Calcium: 7.1 mg/dL — ABNORMAL LOW (ref 8.9–10.3)
Chloride: 109 mmol/L (ref 98–111)
Creatinine, Ser: 0.71 mg/dL (ref 0.44–1.00)
GFR calc Af Amer: >60 mL/min (ref >60)
GFR calc non Af Amer: >60 mL/min (ref >60)
Glucose, Bld: 213 mg/dL — ABNORMAL HIGH (ref 70–99)
Potassium: 4.8 mmol/L (ref 3.5–5.1)
Sodium: 144 mmol/L (ref 135–145)

## 2019-11-22 LAB — PROCALCITONIN: Procalcitonin: 1.98 ng/mL

## 2019-11-22 LAB — MAGNESIUM: Magnesium: 2 mg/dL (ref 1.7–2.4)

## 2019-11-22 LAB — PHOSPHORUS: Phosphorus: 2.8 mg/dL (ref 2.5–4.6)

## 2019-11-22 MED ORDER — TRAVASOL 10 % IV SOLN
INTRAVENOUS | Status: AC
  Filled 2019-11-22: qty 1272

## 2019-11-22 MED ORDER — FUROSEMIDE 10 MG/ML IJ SOLN
40.0000 mg | Freq: Once | INTRAMUSCULAR | Status: AC
  Administered 2019-11-22: 40 mg via INTRAVENOUS
  Filled 2019-11-22: qty 4

## 2019-11-22 MED ORDER — FUROSEMIDE 10 MG/ML IJ SOLN
60.0000 mg | Freq: Once | INTRAMUSCULAR | Status: AC
  Administered 2019-11-22: 60 mg via INTRAVENOUS
  Filled 2019-11-22: qty 6

## 2019-11-22 MED ORDER — HYDROMORPHONE HCL 1 MG/ML IJ SOLN
1.0000 mg | Freq: Once | INTRAMUSCULAR | Status: AC
  Administered 2019-11-22: 1 mg via INTRAVENOUS
  Filled 2019-11-22: qty 1

## 2019-11-22 MED ORDER — BLISTEX MEDICATED EX OINT
TOPICAL_OINTMENT | CUTANEOUS | Status: DC | PRN
  Filled 2019-11-22: qty 6.3

## 2019-11-22 MED ORDER — LABETALOL HCL 5 MG/ML IV SOLN
20.0000 mg | INTRAVENOUS | Status: DC | PRN
  Administered 2019-11-23: 20 mg via INTRAVENOUS
  Administered 2019-11-23: 10 mg via INTRAVENOUS
  Administered 2019-11-23: 20 mg via INTRAVENOUS
  Filled 2019-11-22 (×3): qty 4

## 2019-11-22 NOTE — Consult Note (Signed)
PHARMACY - TOTAL PARENTERAL NUTRITION CONSULT NOTE   Indication: Prolonged ileus  Patient Measurements: Height: '5\' 9"'  (175.3 cm) Weight: (!) 211 kg (465 lb 2.7 oz) IBW/kg (Calculated) : 66.2 TPN AdjBW (KG): 90.5 Body mass index is 68.69 kg/m.  Assessment: 34 year old female with PMHx of HTN admitted with concern for perforated diverticulitis with contained abscesses. Patient is POD #4 from laparoscopic lysis of adhesions and drainage of intra-abdominal abscess. There was no evidence of perforation. Procedure complicated by hypoxic respiratory failure requiring intubation. Patient tolerating extubation (from 9/10).    Patient started on Levophed for pressure support which is being weaned.    Glucose / Insulin: BG range 147-209: 19u SSI required   Electrolytes: within normal limits  Renal: Scr 0.76 >> 0.97 >> 1.08 >> 0.71 (UOP decreasing)  LFTs / TGs: LFTs WNL at baseline, TG 69 at baseline Prealbumin / albumin: <5 / 1.7 Intake / Output; MIVF: Net + 10L last three days.  Continue to monitor I&Os and need for intermittent Lasix as tolerated to maintain euvolemia.  GI Imaging: 9/7 CT abdomen pelvis: colonic perforation related to diverticulitis with worsening fluid and gas containing abscesses is throughout the low abdomen and pelvis many with interloop and mesenteric involvement, also with multiple areas of pelvic fluid as well showing generalized inflammation tracking to the mid abdomen. Interval decompression of the abscess in the cul-de-sac following drain placement. Extensive body wall edema.   Surgeries / Procedures:  9/8 Robotic assisted laparoscopic lysis of adhesions and drainage of intra-abdominal abscess WITHOUT evidence of perforation  Central access: 11/19/19 TPN start date: 11/19/19  Nutritional Goals (per RD recommendation on 9/7 and 9/9 --calorie goal slightly decreased but overall the same): kCal: 2600 - 2800/day, Protein: 130 - 140g/day, Fluid: > 2000 mL Goal TPN  rate is 100 mL/hr (provides 140 g of protein and 2800 kcals per day)  Current Nutrition:  NPO   Plan:   Continue TPN @ 100 mL/hr at 1800 (full rate)  Nutritional components: 15.6 % dextrose, 53 g/L amino acids, 31 g/L lipids  Electrolytes in TPN: 59mq/L of Na, 516m/L of K, 37m30mL of Ca, 37mE37m of Mg, and 137mm44m of Phos. Cl:Ac ratio 1:1  Add standard MVI and trace elements to TPN  continue Moderate q4h SSI and adjust as needed - will add 10 units insulin regular to TPN  Fluid goal met with TPN at full rate  Monitor TPN labs daily until stable then on Mon/Thurs. Patient is at risk for re-feeding.   CharlLu DuffelrmD, BCPS Clinical Pharmacist 11/22/2019 7:58 AM

## 2019-11-22 NOTE — Progress Notes (Signed)
CRITICAL CARE NOTE 34 yo morbidly obese AAF with complicated case of acute diverticulitis. Patient had perc drains placed last week, symptoms of fevers and abd pain persisted.Patient underwent robotic surgery 9/8  with pus pockets and lysis of adhesions, there was NO overt perforation of fecal matter or intraperitoneal soilage.  Postoperatively patient was unable to wean from the ventilator due to severe hypoventilation from morbid obesity (BMI 68) and metabolic acidosis.    SIGNIFICANT EVENTS/STUDIES: 9/8 PCCM CONTACTED FOR CONSULTATION for post op resp failure and acidosis 9/9 failed weaning trials due to increased WOB PICC line to be placed 9/10 met weaning criteria extubated with mandatory BiPAP at at bedtime and as needed during day, high risk for reintubation due to morbid obesity and obesity with alveolar hypoventilation  CC  follow up respiratory failure, obesity with alveolar hypoventilation  SUBJECTIVE Cough with tenacious secretions.  Required BiPAP overnight.  Currently on BiPAP.  Occasionally tachypneic suspect this mostly due to body habitus.  Tachycardic.  Still with positive I's and O's.  REVIEW OF SYSTEMS A 10 point review of systems was performed and it is as noted above otherwise negative.  BP (!) 154/102   Pulse (!) 131   Temp 100.1 F (37.8 C) (Oral)   Resp (!) 36   Ht _0  (1.753 m)   Wt (!) 211 kg   LMP 11/11/2019   SpO2 96%   BMI 68.69 kg/m   Intake/Output Summary (Last 24 hours) at 11/22/2019 1006 Last data filed at 11/22/2019 0854 Gross per 24 hour  Intake 12200 ml  Output 7835 ml  Net 4365 ml   I/O last 3 completed shifts: In: 22 [I.V.:12000; IV Piggyback:200] Out: 76 [Urine:4800; Emesis/NG output:2325; Drains:100] Total I/O In: -  Out: 42 [Urine:3600]   Estimated body mass index is 68.69 kg/m as calculated from the following:   Height as of this encounter: _1  (1.753 m).   Weight as of this encounter: 211 kg.  PHYSICAL  EXAMINATION:  GENERAL: Morbidly obese woman, awake and alert, follows commands.  HEAD: Normocephalic, atraumatic.  EYES: Pupils equal, round, reactive to light.  No scleral icterus.  MOUTH: BiPAP mask in place. NECK: Supple.  Thick, trachea midline. PULMONARY: Coarse breath sounds, no other adventitious sounds. CARDIOVASCULAR: S1 and S2.  Tachycardic rate with regular rhythm. No murmurs, rubs, or gallops.  GASTROINTESTINAL: Soft, nontender, obese, cannot make further assessment drains in place.. MUSCULOSKELETAL: No swelling, clubbing, or edema.  NEUROLOGIC: Awake and alert, follows commands.  SKIN:intact,warm,dry.  Scheduled Meds: . sodium chloride   Intravenous Once  . chlorhexidine  15 mL Mouth Rinse BID  . Chlorhexidine Gluconate Cloth  6 each Topical Q0600  . enoxaparin (LOVENOX) injection  40 mg Subcutaneous Q12H  . insulin aspart  0-15 Units Subcutaneous Q4H  . magnesium hydroxide  30 mL Oral Daily  . mouth rinse  15 mL Mouth Rinse q12n4p  . pantoprazole (PROTONIX) IV  40 mg Intravenous Q12H  . polyethylene glycol  17 g Oral Daily  . sodium chloride flush  10-40 mL Intracatheter Q12H  . sodium chloride flush  5 mL Intracatheter Q8H   Continuous Infusions: . sodium chloride Stopped (11/19/19 1354)  . sodium chloride    . anidulafungin 100 mg (11/21/19 1552)  . meropenem (MERREM) IV 1 g (11/22/19 0557)  . norepinephrine (LEVOPHED) Adult infusion 10 mcg/min (11/20/19 0433)  . TPN ADULT (ION) 100 mL/hr at 11/21/19 1849  . TPN ADULT (ION)     PRN Meds:.sodium chloride, acetaminophen, alum &  mag hydroxide-simeth, HYDROmorphone (DILAUDID) injection, ibuprofen, lip balm, methocarbamol, ondansetron **OR** ondansetron (ZOFRAN) IV, oxyCODONE, promethazine, sodium chloride flush, sodium chloride flush, traMADol    CULTURE RESULTS   Recent Results (from the past 240 hour(s))  Aerobic/Anaerobic Culture (surgical/deep wound)     Status: None   Collection Time: 11/13/19  6:52 PM    Specimen: Abscess  Result Value Ref Range Status   Specimen Description   Final    ABSCESS Performed at [facility], [address], [place], [place] [address]    Special Requests   Final    Normal Performed at [facility], [address] [place], [place] [address]    Gram Stain   Final    MODERATE WBC PRESENT, PREDOMINANTLY PMN ABUNDANT GRAM NEGATIVE RODS ABUNDANT GRAM POSITIVE COCCI IN PAIRS IN CHAINS FEW GRAM POSITIVE RODS    Culture   Final    ABUNDANT ESCHERICHIA COLI STREPTOCOCCUS GROUP C Beta hemolytic streptococci are predictably susceptible to penicillin and other beta lactams. Susceptibility testing not routinely performed. NO ANAEROBES ISOLATED Performed at [facility], [place] [address] [place], [place] [address]    Report Status 11/19/2019 FINAL  Final   Organism ID, Bacteria ESCHERICHIA COLI  Final      Susceptibility   Escherichia coli - MIC*    AMPICILLIN <=2 SENSITIVE Sensitive     CEFAZOLIN <=4 SENSITIVE Sensitive     CEFEPIME <=0.12 SENSITIVE Sensitive     CEFTAZIDIME <=1 SENSITIVE Sensitive     CEFTRIAXONE <=0.25 SENSITIVE Sensitive     CIPROFLOXACIN <=0.25 SENSITIVE Sensitive     GENTAMICIN <=1 SENSITIVE Sensitive     IMIPENEM <=0.25 SENSITIVE Sensitive     TRIMETH/SULFA <=20 SENSITIVE Sensitive     AMPICILLIN/SULBACTAM <=2 SENSITIVE Sensitive     PIP/TAZO <=4 SENSITIVE Sensitive     * ABUNDANT ESCHERICHIA COLI  MRSA PCR Screening     Status: None   Collection Time: 11/18/19  6:36 PM   Specimen: Nasopharyngeal  Result Value Ref Range Status   MRSA by PCR NEGATIVE NEGATIVE Final    Comment:        The GeneXpert MRSA Assay (FDA approved for NASAL specimens only), is one component of a comprehensive MRSA colonization surveillance program. It is not intended to diagnose MRSA infection nor to guide or monitor treatment for MRSA infections. Performed at [facility], [address] [place], [place] [address]     . Results for orders placed or performed during the hospital encounter of 11/09/19 (from the past 24 hour(s))  Glucose, capillary     Status: Abnormal   Collection Time: 11/21/19 11:12 AM  Result Value Ref Range   Glucose-Capillary 174 (H) 70 - 99 mg/dL  CBC with Differential/Platelet     Status: Abnormal   Collection Time: 11/21/19 12:30 PM  Result Value Ref Range   WBC 19.7 (H) 4.0 - 10.5 K/uL   RBC 2.71 (L) 3.87 - 5.11 MIL/uL   Hemoglobin 7.6 (L) 12.0 - 15.0 g/dL   HCT 22.5 (L) 36 - 46 %   MCV 83.0 80.0 - 100.0 fL   MCH 28.0 26.0 - 34.0 pg   MCHC 33.8 30.0 - 36.0 g/dL   RDW 21.8 (H) 11.5 - 15.5 %   Platelets 236 150 - 400 K/uL   nRBC 0.4 (H) 0.0 - 0.2 %   Neutrophils Relative % 84 %   Neutro Abs 16.4 (H) 1.7 - 7.7 K/uL   Lymphocytes Relative 6 %  Lymphs Abs 1.2 0.7 - 4.0 K/uL   Monocytes Relative 4 %   Monocytes Absolute 0.8 0 - 1 K/uL   Eosinophils Relative 0 %   Eosinophils Absolute 0.1 0 - 0 K/uL   Basophils Relative 0 %   Basophils Absolute 0.1 0 - 0 K/uL   WBC Morphology MILD LEFT SHIFT (1-5% METAS, OCC MYELO, OCC BANDS)    Smear Review Normal platelet morphology    Immature Granulocytes 6 %   Abs Immature Granulocytes 1.10 (H) 0.00 - 0.07 K/uL   Dohle Bodies PRESENT    Polychromasia PRESENT    Target Cells PRESENT   Procalcitonin - Baseline     Status: None   Collection Time: 11/21/19 12:30 PM  Result Value Ref Range   Procalcitonin 2.27 ng/mL  Glucose, capillary     Status: Abnormal   Collection Time: 11/21/19  3:44 PM  Result Value Ref Range   Glucose-Capillary 209 (H) 70 - 99 mg/dL  Glucose, capillary     Status: Abnormal   Collection Time: 11/21/19  7:46 PM  Result Value Ref Range   Glucose-Capillary 199 (H) 70 - 99 mg/dL  Glucose, capillary     Status: Abnormal   Collection Time: 11/21/19 11:49 PM  Result Value Ref Range   Glucose-Capillary 192 (H) 70 - 99 mg/dL  Glucose, capillary     Status: Abnormal   Collection Time: 11/22/19  4:01 AM   Result Value Ref Range   Glucose-Capillary 199 (H) 70 - 99 mg/dL  Basic metabolic panel     Status: Abnormal   Collection Time: 11/22/19  4:43 AM  Result Value Ref Range   Sodium 144 135 - 145 mmol/L   Potassium 4.8 3.5 - 5.1 mmol/L   Chloride 109 98 - 111 mmol/L   CO2 30 22 - 32 mmol/L   Glucose, Bld 213 (H) 70 - 99 mg/dL   BUN 16 6 - 20 mg/dL   Creatinine, Ser 0.71 0.44 - 1.00 mg/dL   Calcium 7.1 (L) 8.9 - 10.3 mg/dL   GFR calc non Af Amer >60 >60 mL/min   GFR calc Af Amer >60 >60 mL/min   Anion gap 5 5 - 15  Phosphorus     Status: None   Collection Time: 11/22/19  4:43 AM  Result Value Ref Range   Phosphorus 2.8 2.5 - 4.6 mg/dL  Magnesium     Status: None   Collection Time: 11/22/19  4:43 AM  Result Value Ref Range   Magnesium 2.0 1.7 - 2.4 mg/dL  Procalcitonin     Status: None   Collection Time: 11/22/19  4:43 AM  Result Value Ref Range   Procalcitonin 1.98 ng/mL  Glucose, capillary     Status: Abnormal   Collection Time: 11/22/19  7:56 AM  Result Value Ref Range   Glucose-Capillary 200 (H) 70 - 99 mg/dL  Blood gas, arterial     Status: Abnormal   Collection Time: 11/22/19  7:57 AM  Result Value Ref Range   FIO2 0.32    Delivery systems NASAL CANNULA    pH, Arterial 7.36 7.35 - 7.45   pCO2 arterial 59 (H) 32 - 48 mmHg   pO2, Arterial 72 (L) 83 - 108 mmHg   Bicarbonate 33.3 (H) 20.0 - 28.0 mmol/L   Acid-Base Excess 6.9 (H) 0.0 - 2.0 mmol/L   O2 Saturation 93.6 %   Patient temperature 37.0    Collection site LEFT RADIAL    Sample type ARTERIAL DRAW  Allens test (pass/fail) PASS PASS        IMAGING   No results found.   Nutrition Status: Nutrition Problem: Inadequate oral intake Etiology: inability to eat Signs/Symptoms: NPO status Interventions: TPN   CBC    Component Value Date/Time   WBC 19.7 (H) 11/21/2019 1230   RBC 2.71 (L) 11/21/2019 1230   HGB 7.6 (L) 11/21/2019 1230   HCT 22.5 (L) 11/21/2019 1230   PLT 236 11/21/2019 1230   MCV  83.0 11/21/2019 1230   MCH 28.0 11/21/2019 1230   MCHC 33.8 11/21/2019 1230   RDW 21.8 (H) 11/21/2019 1230   LYMPHSABS 1.2 11/21/2019 1230   MONOABS 0.8 11/21/2019 1230   EOSABS 0.1 11/21/2019 1230   BASOSABS 0.1 11/21/2019 1230    BMP Latest Ref Rng & Units 11/22/2019 11/21/2019 11/20/2019  Glucose 70 - 99 mg/dL 213(H) 166(H) 188(H)  BUN 6 - 20 mg/dL _0 Creatinine 0.44 - 1.00 mg/dL 0.71 0.87 1.08(H)  Sodium 135 - 145 mmol/L 144 141 139  Potassium 3.5 - 5.1 mmol/L 4.8 4.0 4.1  Chloride 98 - 111 mmol/L 109 106 107  CO2 22 - 32 mmol/L _1 Calcium 8.9 - 10.3 mg/dL 7.1(L) 6.9(L) 6.5(L)      Indwelling Urinary Catheter continued, requirement due to   Reason to continue Indwelling Urinary Catheter strict Intake/Output monitoring for hemodynamic instability         Ventilator Extubated 9/10   Ventilator Sedation N/A      ASSESSMENT AND PLAN SYNOPSIS  Severe ACUTE Hypoxic and Hypercapnic Respiratory Failure with post op resp failure due to acute metabolic acidosis due to acute diverticulitis with sepsisSevere ACUTE Hypoxic and Hypercapnic Respiratory Failure Tolerated extubation Mandatory BiPAP at at bedtime and as needed during day Suspect patient has SEVERE sleep apnea Transferred to bariatric bed Start mobilizing when feasible Super morbid obesity will make mobility difficult  Morbid obesity, with obesity and alveolar hypoventilation  High suspect for SEVERE sleep apnea Continue mandatory BiPAP at at bedtime and as needed during day If she requires reintubation would have to go directly to trach  ACUTE KIDNEY INJURY/Renal Failure Resolved Continue to monitor Correct electrolytes Acidosis corrected  Severe sepsis/SIRS GI-acute diverticulitis with micro perforation and abscesses  S/P robotic surgery for lysis of adhesions  use vasopressors to keep MAP>65 as needed follow ABG and LA, trend procalcitonin Cultures showing E. coli and  Streptococcus Continue antibiotics/antifungal Antibiotics: Meropenem, antifungal: Eraxis  NUTRITIONAL STATUS Nutrition Status: Nutrition Problem: Inadequate oral intake Etiology: inability to eat Signs/Symptoms: NPO status Interventions: TPN   ELECTROLYTES Mostly issues with hypocalcemia Replete calcium Adjust TPN to reflect Pharmacy following   PROPHYLAXIS: DVT: Enoxaparin GI: Protonix  Care coordination done with bedside nurse.  Discussed with Dr. Celine Ahr.  Renold Don, MD [facility]   *This note was dictated using voice recognition software/Dragon.  Despite best efforts to proofread, errors can occur which can change the meaning.  Any change was purely unintentional.

## 2019-11-22 NOTE — Progress Notes (Signed)
[facility] SURGICAL PROGRESS NOTE  Hospital Day(s): 13.   Post op day(s): 4 Days Post-Op.   Interval History:  Patient seen and examined Extubated to BiPAP on Friday  Off of pressors (is now actually hypertensive), but remains tachycardic and tachypneic. Patient reports no significant abdominal pain. No CBC this AM Renal function remains normal. No electrolyte derangements NGT with 1475 ccs out; per nursing staff, patient's mother is giving her water and ice chips Surgically placed drain with 60 cc of purulent fluid Continues on meropenem and anidulafungin  Vital signs in last 24 hours: [min-max] current  Temp:  [98.8 F (37.1 C)-100.1 F (37.8 C)] 100.1 F (37.8 C) (09/12 0800) Pulse Rate:  [128-145] 132 (09/12 1000) Resp:  [15-45] 44 (09/12 1000) BP: (133-171)/(77-113) 170/105 (09/12 1000) SpO2:  [77 %-100 %] 97 % (09/12 1000) FiO2 (%):  [28 %] 28 % (09/11 1227)     Height: 5\' 9"  (175.3 cm) Weight: (!) 211 kg BMI (Calculated): 68.66   Intake/Output last 2 shifts:  09/11 0701 - 09/12 0700 In: 06-25-1980 [I.V.:12000; IV Piggyback:200] Out: 4635 [Urine:3100; Emesis/NG output:1475; Drains:60]   Physical Exam:  Constitutional: Alert Respiratory: Currently off of positive pressure support. Cardiovascular: Tachycardic, regular  Gastrointestinal: Obese, soft, no illicited tenderness, difficult to assess distension, no rebound/guarding, JP in RLQ with purulent output Musculoskeletal: Extremities appear more edematous  Integumentary: Laparoscopic incisions are CDI with dermabond, no erythema or drainage    Labs:  CBC Latest Ref Rng & Units 11/21/2019 11/20/2019 11/19/2019  WBC 4.0 - 10.5 K/uL 19.7(H) 23.5(H) 17.4(H)  Hemoglobin 12.0 - 15.0 g/dL 7.6(L) 8.2(L) 8.7(L)  Hematocrit 36 - 46 % 22.5(L) 24.0(L) 26.0(L)  Platelets 150 - 400 K/uL 236 319 347   CMP Latest Ref Rng & Units 11/22/2019 11/21/2019 11/20/2019  Glucose 70 - 99 mg/dL 01/20/2020) 604(V) 409(W)  BUN 6 - 20  mg/dL 16 20 19   Creatinine 0.44 - 1.00 mg/dL 119(J 4.78)  Sodium 135 - 145 mmol/L 144 141 139  Potassium 3.5 - 5.1 mmol/L 4.8 4.0 4.1  Chloride 98 - 111 mmol/L 109 106 107  CO2 22 - 32 mmol/L 30 27 23   Calcium 8.9 - 10.3 mg/dL 7.1(L) 6.9(L) 6.5(L)  Total Protein 6.5 - 8.1 g/dL - - -  Total Bilirubin 0.3 - 1.2 mg/dL - - -  Alkaline Phos 38 - 126 U/L - - -  AST 15 - 41 U/L - - -  ALT 0 - 44 U/L - - -    Imaging studies: No new pertinent imaging studies   Assessment/Plan:  34 y.o. female now extubated and off of vasopressors 4 Days Post-Op s/p robotic assisted laparoscopic lysis of adhesion and drainage of intra-abdominal abscess WITHOUT evidence of perforation or frank stool for perforated diverticulitis, complicated by hypoxic respiratory failure requiring post-operative intubation.    - At high risk for re-intubation; per PCCM, will need trach if this occurs.             - Continue NPO + TPN             - Continue NGT for now              - Continue IV ABx   - No plans for surgical re-intervention at this time             - Monitor abdominal examination; on-going bowel function - Pain control prn; antiemetics prn - Monitor leukocytosis, renal function - Home medications -Mobilization encouraged as tolerated -  DVT prophylaxis; okay to resume   - Mother counseled that feeding the patient water and ice chips is counter-productive to her progress.  All of the above findings and recommendations were discussed with the medical team.

## 2019-11-22 NOTE — Progress Notes (Signed)
Desats quickly with any exertion, and when bipap is removed, Neither patient or mother seem to understand how ill the patient is. Mother gets upset when bipap is placed on patient and states she doesn't understand why she has to be on it. States that her daughter does not like it while feeding daughter ice and water continuously. Has had 1400 out via NG tube over night with little intake on this shift. Also had 1500 of urine out via foley.

## 2019-11-23 DIAGNOSIS — K5792 Diverticulitis of intestine, part unspecified, without perforation or abscess without bleeding: Secondary | ICD-10-CM | POA: Diagnosis not present

## 2019-11-23 DIAGNOSIS — K5720 Diverticulitis of large intestine with perforation and abscess without bleeding: Secondary | ICD-10-CM | POA: Diagnosis present

## 2019-11-23 DIAGNOSIS — K651 Peritoneal abscess: Secondary | ICD-10-CM | POA: Diagnosis not present

## 2019-11-23 DIAGNOSIS — D72829 Elevated white blood cell count, unspecified: Secondary | ICD-10-CM | POA: Diagnosis not present

## 2019-11-23 LAB — CBC
HCT: 22.6 % — ABNORMAL LOW (ref 36.0–46.0)
Hemoglobin: 7 g/dL — ABNORMAL LOW (ref 12.0–15.0)
MCH: 24.4 pg — ABNORMAL LOW (ref 26.0–34.0)
MCHC: 31 g/dL (ref 30.0–36.0)
MCV: 78.7 fL — ABNORMAL LOW (ref 80.0–100.0)
Platelets: 236 10*3/uL (ref 150–400)
RBC: 2.87 MIL/uL — ABNORMAL LOW (ref 3.87–5.11)
RDW: 19.9 % — ABNORMAL HIGH (ref 11.5–15.5)
WBC: 15.2 10*3/uL — ABNORMAL HIGH (ref 4.0–10.5)
nRBC: 0.3 % — ABNORMAL HIGH (ref 0.0–0.2)

## 2019-11-23 LAB — COMPREHENSIVE METABOLIC PANEL
ALT: 12 U/L (ref 0–44)
AST: 22 U/L (ref 15–41)
Albumin: 1.5 g/dL — ABNORMAL LOW (ref 3.5–5.0)
Alkaline Phosphatase: 62 U/L (ref 38–126)
Anion gap: 6 (ref 5–15)
BUN: 13 mg/dL (ref 6–20)
CO2: 32 mmol/L (ref 22–32)
Calcium: 7.3 mg/dL — ABNORMAL LOW (ref 8.9–10.3)
Chloride: 107 mmol/L (ref 98–111)
Creatinine, Ser: 0.58 mg/dL (ref 0.44–1.00)
GFR calc Af Amer: >60 mL/min (ref >60)
GFR calc non Af Amer: >60 mL/min (ref >60)
Glucose, Bld: 212 mg/dL — ABNORMAL HIGH (ref 70–99)
Potassium: 4.4 mmol/L (ref 3.5–5.1)
Sodium: 145 mmol/L (ref 135–145)
Total Bilirubin: 0.4 mg/dL (ref 0.3–1.2)
Total Protein: 6.5 g/dL (ref 6.5–8.1)

## 2019-11-23 LAB — TRIGLYCERIDES: Triglycerides: 103 mg/dL (ref <150)

## 2019-11-23 LAB — DIFFERENTIAL
Abs Immature Granulocytes: 0.89 10*3/uL — ABNORMAL HIGH (ref 0.00–0.07)
Basophils Absolute: 0 10*3/uL (ref 0.0–0.1)
Basophils Relative: 0 %
Eosinophils Absolute: 0.1 10*3/uL (ref 0.0–0.5)
Eosinophils Relative: 1 %
Immature Granulocytes: 6 %
Lymphocytes Relative: 14 %
Lymphs Abs: 2.1 10*3/uL (ref 0.7–4.0)
Monocytes Absolute: 1.1 10*3/uL — ABNORMAL HIGH (ref 0.1–1.0)
Monocytes Relative: 7 %
Neutro Abs: 11 10*3/uL — ABNORMAL HIGH (ref 1.7–7.7)
Neutrophils Relative %: 72 %
Smear Review: NORMAL

## 2019-11-23 LAB — PROCALCITONIN: Procalcitonin: 1.11 ng/mL

## 2019-11-23 LAB — GLUCOSE, CAPILLARY
Glucose-Capillary: 220 mg/dL — ABNORMAL HIGH (ref 70–99)
Glucose-Capillary: 194 mg/dL — ABNORMAL HIGH (ref 70–99)
Glucose-Capillary: 204 mg/dL — ABNORMAL HIGH (ref 70–99)
Glucose-Capillary: 236 mg/dL — ABNORMAL HIGH (ref 70–99)
Glucose-Capillary: 271 mg/dL — ABNORMAL HIGH (ref 70–99)

## 2019-11-23 LAB — PREALBUMIN: Prealbumin: 6.6 mg/dL — ABNORMAL LOW (ref 18–38)

## 2019-11-23 LAB — PHOSPHORUS: Phosphorus: 2 mg/dL — ABNORMAL LOW (ref 2.5–4.6)

## 2019-11-23 LAB — MAGNESIUM: Magnesium: 1.8 mg/dL (ref 1.7–2.4)

## 2019-11-23 MED ORDER — LABETALOL HCL 5 MG/ML IV SOLN
20.0000 mg | INTRAVENOUS | Status: DC | PRN
  Administered 2019-11-24 – 2019-12-05 (×11): 20 mg via INTRAVENOUS
  Filled 2019-11-23 (×11): qty 4

## 2019-11-23 MED ORDER — TRAVASOL 10 % IV SOLN
INTRAVENOUS | Status: AC
  Filled 2019-11-23: qty 1272

## 2019-11-23 MED ORDER — HYDROMORPHONE HCL 1 MG/ML IJ SOLN
0.5000 mg | INTRAMUSCULAR | Status: DC | PRN
  Administered 2019-11-24 (×4): 0.5 mg via INTRAVENOUS
  Filled 2019-11-23 (×5): qty 0.5

## 2019-11-23 NOTE — Progress Notes (Signed)
Pt.was transferred to floor around 18:49 . Pt mews was red at level #4. This RN question the primary RN in ICU when getting report about the mews. Per ICU ,RN patient was stable to transfer to med-surg floor. Family at bedside upon transfer.

## 2019-11-23 NOTE — Progress Notes (Signed)
1730 Report called to Annabella on 2C

## 2019-11-23 NOTE — Progress Notes (Signed)
CRITICAL CARE NOTE 34 yo morbidly obese AAF with complicated case of acute diverticulitis. Patient had perc drains placed last week, symptoms of fevers and abd pain persisted.Patient underwent robotic surgery 9/8  with pus pockets and lysis of adhesions, there was NO overt perforation of fecal matter or intraperitoneal soilage.  Postoperatively patient was unable to wean from the ventilator due to severe hypoventilation from morbid obesity (BMI 68) and metabolic acidosis.    SIGNIFICANT EVENTS/STUDIES: 9/8 PCCM CONTACTED FOR CONSULTATION for post op resp failure and acidosis 9/9 failed weaning trials due to increased WOB PICC line to be placed 9/10 met weaning criteria extubated with mandatory BiPAP at at bedtime and as needed during day, high risk for reintubation due to morbid obesity and obesity with alveolar hypoventilation 9/13 ICU needs resolved, tolerates CPAP for OSA  CC  follow up respiratory failure, obesity with alveolar hypoventilation  SUBJECTIVE Alert and awake Follows commands Tolerates CPAP Can transfer to gen surgery floor TRH to follow along in consultation    Review of Systems:  Gen:  Denies  fever, sweats, chills weight loss  HEENT: Denies blurred vision, double vision, ear pain, eye pain, hearing loss, nose bleeds, sore throat Cardiac:  No dizziness, chest pain or heaviness, chest tightness,edema, No JVD Resp:   No cough, -sputum production, -shortness of breath,-wheezing, -hemoptysis,  Gi: Denies swallowing difficulty, stomach pain, nausea or vomiting, diarrhea, constipation, bowel incontinence Gu:  Denies bladder incontinence, burning urine Ext:   Denies Joint pain, stiffness or swelling Skin: Denies  skin rash, easy bruising or bleeding or hives Endoc:  Denies polyuria, polydipsia , polyphagia or weight change Psych:   Denies depression, insomnia or hallucinations  Other:  All other systems negative   BP (!) 163/111   Pulse (!) 116   Temp 99.2 F (37.3  C) (Axillary)   Resp (!) 25   Ht '5\' 9"'  (1.753 m)   Wt (!) 211 kg   LMP 11/11/2019   SpO2 100%   BMI 68.69 kg/m   Intake/Output Summary (Last 24 hours) at 11/23/2019 0920 Last data filed at 11/23/2019 0600 Gross per 24 hour  Intake 656.38 ml  Output 5360 ml  Net -4703.62 ml   I/O last 3 completed shifts: In: 656.4 [IV Piggyback:656.4] Out: 50093 [Urine:9200; Emesis/NG output:2600; Drains:80] No intake/output data recorded.   Estimated body mass index is 68.69 kg/m as calculated from the following:   Height as of this encounter: '5\' 9"'  (1.753 m).   Weight as of this encounter: 211 kg.  PHYSICAL EXAMINATION:  Physical Examination:   General Appearance: No distress morbidly obese Neuro:without focal findings,  speech normal,  HEENT: PERRLA, EOM intact.   Pulmonary: normal breath sounds, No wheezing.  CardiovascularNormal S1,S2.  No m/r/g.   Abdomen: Benign, Soft, non-tender. Renal:  No costovertebral tenderness  GU:  Not performed at this time. Endoc: No evident thyromegaly Skin:   warm, no rashes, no ecchymosis  Extremities: normal, no cyanosis, clubbing. PSYCHIATRIC: Mood, affect within normal limits.   ALL OTHER ROS ARE NEGATIVE   Scheduled Meds: . sodium chloride   Intravenous Once  . chlorhexidine  15 mL Mouth Rinse BID  . Chlorhexidine Gluconate Cloth  6 each Topical Q0600  . enoxaparin (LOVENOX) injection  40 mg Subcutaneous Q12H  . insulin aspart  0-15 Units Subcutaneous Q4H  . magnesium hydroxide  30 mL Oral Daily  . mouth rinse  15 mL Mouth Rinse q12n4p  . pantoprazole (PROTONIX) IV  40 mg Intravenous Q12H  .  polyethylene glycol  17 g Oral Daily  . sodium chloride flush  10-40 mL Intracatheter Q12H  . sodium chloride flush  5 mL Intracatheter Q8H   Continuous Infusions: . sodium chloride Stopped (11/19/19 1354)  . sodium chloride    . anidulafungin 100 mg (11/22/19 1648)  . meropenem (MERREM) IV 1 g (11/23/19 0549)  . norepinephrine (LEVOPHED)  Adult infusion 10 mcg/min (11/20/19 0433)  . TPN ADULT (ION) 100 mL/hr at 11/22/19 1824   PRN Meds:.sodium chloride, acetaminophen, alum & mag hydroxide-simeth, HYDROmorphone (DILAUDID) injection, ibuprofen, labetalol, lip balm, methocarbamol, ondansetron **OR** ondansetron (ZOFRAN) IV, oxyCODONE, promethazine, sodium chloride flush, sodium chloride flush, traMADol    CULTURE RESULTS   Recent Results (from the past 240 hour(s))  Aerobic/Anaerobic Culture (surgical/deep wound)     Status: None   Collection Time: 11/13/19  6:52 PM   Specimen: Abscess  Result Value Ref Range Status   Specimen Description   Final    ABSCESS Performed at [facility], [address] [place], [place] [address]    Special Requests   Final    Normal Performed at [facility], [address] [place], [place] [address]    Gram Stain   Final    MODERATE WBC PRESENT, PREDOMINANTLY PMN ABUNDANT GRAM NEGATIVE RODS ABUNDANT GRAM POSITIVE COCCI IN PAIRS IN CHAINS FEW GRAM POSITIVE RODS    Culture   Final    ABUNDANT ESCHERICHIA COLI STREPTOCOCCUS GROUP C Beta hemolytic streptococci are predictably susceptible to penicillin and other beta lactams. Susceptibility testing not routinely performed. NO ANAEROBES ISOLATED Performed at [facility], [place] [address], [place], [place] [address]    Report Status 11/19/2019 FINAL  Final   Organism ID, Bacteria ESCHERICHIA COLI  Final      Susceptibility   Escherichia coli - MIC*    AMPICILLIN <=2 SENSITIVE Sensitive     CEFAZOLIN <=4 SENSITIVE Sensitive     CEFEPIME <=0.12 SENSITIVE Sensitive     CEFTAZIDIME <=1 SENSITIVE Sensitive     CEFTRIAXONE <=0.25 SENSITIVE Sensitive     CIPROFLOXACIN <=0.25 SENSITIVE Sensitive     GENTAMICIN <=1 SENSITIVE Sensitive     IMIPENEM <=0.25 SENSITIVE Sensitive     TRIMETH/SULFA <=20 SENSITIVE Sensitive     AMPICILLIN/SULBACTAM <=2 SENSITIVE Sensitive     PIP/TAZO <=4 SENSITIVE Sensitive     *  ABUNDANT ESCHERICHIA COLI  MRSA PCR Screening     Status: None   Collection Time: 11/18/19  6:36 PM   Specimen: Nasopharyngeal  Result Value Ref Range Status   MRSA by PCR NEGATIVE NEGATIVE Final    Comment:        The GeneXpert MRSA Assay (FDA approved for NASAL specimens only), is one component of a comprehensive MRSA colonization surveillance program. It is not intended to diagnose MRSA infection nor to guide or monitor treatment for MRSA infections. Performed at [facility], [address] [place], [place] [address]    . Results for orders placed or performed during the hospital encounter of 11/09/19 (from the past 24 hour(s))  Glucose, capillary     Status: Abnormal   Collection Time: 11/22/19 11:57 AM  Result Value Ref Range   Glucose-Capillary 214 (H) 70 - 99 mg/dL  Glucose, capillary     Status: Abnormal   Collection Time: 11/22/19  3:51 PM  Result Value Ref Range   Glucose-Capillary 209 (H) 70 - 99 mg/dL  Glucose, capillary     Status: Abnormal   Collection Time:  11/22/19  7:52 PM  Result Value Ref Range   Glucose-Capillary 239 (H) 70 - 99 mg/dL  Glucose, capillary     Status: Abnormal   Collection Time: 11/22/19 11:56 PM  Result Value Ref Range   Glucose-Capillary 234 (H) 70 - 99 mg/dL  Glucose, capillary     Status: Abnormal   Collection Time: 11/23/19  3:43 AM  Result Value Ref Range   Glucose-Capillary 220 (H) 70 - 99 mg/dL  Comprehensive metabolic panel     Status: Abnormal   Collection Time: 11/23/19  5:43 AM  Result Value Ref Range   Sodium 145 135 - 145 mmol/L   Potassium 4.4 3.5 - 5.1 mmol/L   Chloride 107 98 - 111 mmol/L   CO2 32 22 - 32 mmol/L   Glucose, Bld 212 (H) 70 - 99 mg/dL   BUN 13 6 - 20 mg/dL   Creatinine, Ser 0.58 0.44 - 1.00 mg/dL   Calcium 7.3 (L) 8.9 - 10.3 mg/dL   Total Protein 6.5 6.5 - 8.1 g/dL   Albumin 1.5 (L) 3.5 - 5.0 g/dL   AST 22 15 - 41 U/L   ALT 12 0 - 44 U/L   Alkaline Phosphatase 62 38 - 126 U/L   Total  Bilirubin 0.4 0.3 - 1.2 mg/dL   GFR calc non Af Amer >60 >60 mL/min   GFR calc Af Amer >60 >60 mL/min   Anion gap 6 5 - 15  Magnesium     Status: None   Collection Time: 11/23/19  5:43 AM  Result Value Ref Range   Magnesium 1.8 1.7 - 2.4 mg/dL  Phosphorus     Status: Abnormal   Collection Time: 11/23/19  5:43 AM  Result Value Ref Range   Phosphorus 2.0 (L) 2.5 - 4.6 mg/dL  CBC     Status: Abnormal   Collection Time: 11/23/19  5:43 AM  Result Value Ref Range   WBC 15.2 (H) 4.0 - 10.5 K/uL   RBC 2.87 (L) 3.87 - 5.11 MIL/uL   Hemoglobin 7.0 (L) 12.0 - 15.0 g/dL   HCT 22.6 (L) 36 - 46 %   MCV 78.7 (L) 80.0 - 100.0 fL   MCH 24.4 (L) 26.0 - 34.0 pg   MCHC 31.0 30.0 - 36.0 g/dL   RDW 19.9 (H) 11.5 - 15.5 %   Platelets 236 150 - 400 K/uL   nRBC 0.3 (H) 0.0 - 0.2 %  Differential     Status: Abnormal   Collection Time: 11/23/19  5:43 AM  Result Value Ref Range   Neutrophils Relative % 72 %   Neutro Abs 11.0 (H) 1.7 - 7.7 K/uL   Lymphocytes Relative 14 %   Lymphs Abs 2.1 0.7 - 4.0 K/uL   Monocytes Relative 7 %   Monocytes Absolute 1.1 (H) 0 - 1 K/uL   Eosinophils Relative 1 %   Eosinophils Absolute 0.1 0 - 0 K/uL   Basophils Relative 0 %   Basophils Absolute 0.0 0 - 0 K/uL   WBC Morphology MILD LEFT SHIFT (1-5% METAS, OCC MYELO, OCC BANDS)    Smear Review Normal platelet morphology    Immature Granulocytes 6 %   Abs Immature Granulocytes 0.89 (H) 0.00 - 0.07 K/uL   Polychromasia PRESENT    Target Cells PRESENT   Triglycerides     Status: None   Collection Time: 11/23/19  5:43 AM  Result Value Ref Range   Triglycerides 103 <150 mg/dL  Glucose, capillary  Status: Abnormal   Collection Time: 11/23/19  7:35 AM  Result Value Ref Range   Glucose-Capillary 194 (H) 70 - 99 mg/dL        IMAGING   No results found.   Nutrition Status: Nutrition Problem: Inadequate oral intake Etiology: inability to eat Signs/Symptoms: NPO status Interventions: TPN   CBC     Component Value Date/Time   WBC 15.2 (H) 11/23/2019 0543   RBC 2.87 (L) 11/23/2019 0543   HGB 7.0 (L) 11/23/2019 0543   HCT 22.6 (L) 11/23/2019 0543   PLT 236 11/23/2019 0543   MCV 78.7 (L) 11/23/2019 0543   MCH 24.4 (L) 11/23/2019 0543   MCHC 31.0 11/23/2019 0543   RDW 19.9 (H) 11/23/2019 0543   LYMPHSABS 2.1 11/23/2019 0543   MONOABS 1.1 (H) 11/23/2019 0543   EOSABS 0.1 11/23/2019 0543   BASOSABS 0.0 11/23/2019 0543    BMP Latest Ref Rng & Units 11/23/2019 11/22/2019 11/21/2019  Glucose 70 - 99 mg/dL 212(H) 213(H) 166(H)  BUN 6 - 20 mg/dL '13 16 20  ' Creatinine 0.44 - 1.00 mg/dL 0.58 0.71 0.87  Sodium 135 - 145 mmol/L 145 144 141  Potassium 3.5 - 5.1 mmol/L 4.4 4.8 4.0  Chloride 98 - 111 mmol/L 107 109 106  CO2 22 - 32 mmol/L 32 30 27  Calcium 8.9 - 10.3 mg/dL 7.3(L) 7.1(L) 6.9(L)       ASSESSMENT AND PLAN SYNOPSIS  Severe ACUTE Hypoxic and Hypercapnic Respiratory Failure with post op resp failure due to acute metabolic acidosis due to acute diverticulitis with sepsisSevere ACUTE Hypoxic and Hypercapnic Respiratory Failure-RESOLVED Mandatory BiPAP at at bedtime and as needed during day Suspect patient has SEVERE sleep apnea Transferred to bariatric bed Start mobilizing when feasible Super morbid obesity will make mobility difficult    Morbid obesity, with obesity and alveolar hypoventilation  High suspect for SEVERE sleep apnea Continue mandatory BiPAP at at bedtime and as needed during day If she requires reintubation would have to go directly to trach  ACUTE KIDNEY INJURY/Renal Failure Resolved  Severe sepsis/SIRS-resolving GI-acute diverticulitis with micro perforation and abscesses  S/P robotic surgery for lysis of adhesions  use vasopressors to keep MAP>65 as needed follow ABG and LA, trend procalcitonin Cultures showing E. coli and Streptococcus Continue antibiotics/antifungal Antibiotics: Meropenem, antifungal: Eraxis  NUTRITIONAL STATUS Per GEN  surgery  ELECTROLYTES -follow labs as needed -replace as needed -pharmacy consultation and following  ICU needs resolved TRH to follow along in consultation Gen surgery is Primary Service  PCCM to sign off at this time Case discussed with Gen Surgery PA    Corrin Parker, M.D.  [facility]  Medical Director [place] Director [place] Department

## 2019-11-23 NOTE — Consult Note (Signed)
PHARMACY - TOTAL PARENTERAL NUTRITION CONSULT NOTE   Indication: Prolonged ileus  Patient Measurements: Height: '5\' 9"'  (175.3 cm) Weight: (!) 211 kg (465 lb 2.7 oz) IBW/kg (Calculated) : 66.2 TPN AdjBW (KG): 90.5 Body mass index is 68.69 kg/m.  Assessment: 34 year old female with PMHx of HTN admitted with concern for perforated diverticulitis with contained abscesses. Patient is POD #5 from laparoscopic lysis of adhesions and drainage of intra-abdominal abscess. There was no evidence of perforation. Procedure complicated by hypoxic respiratory failure requiring intubation. Patient extubated 9/10. No longer requiring blood pressure support.   Glucose / Insulin: BG range 194-239: 25u SSI required.   Electrolytes: phos low at 2.0 and mag at the lower limit of normal after receiving 100 mg (60 mg + 40 mg) IV lasix on 9/12  Renal: Scr 0.76 >> 0.97 >> 1.08 >> 0.71 >> 0.58 (good UOP)  LFTs / TGs: LFTs WNL at baseline, TG 69 at baseline, 103 on 9/13  Prealbumin / albumin: <5 / 1.5 Intake / Output; MIVF: UOP of 7.7 L on 9/12. Continue to monitor I&Os and needfor intermittent Lasix as tolerated to maintain euvolemia.  GI Imaging: 9/7 CT abdomen pelvis: colonic perforation related to diverticulitis with worsening fluid and gas containing abscesses is throughout the low abdomen and pelvis many with interloop and mesenteric involvement, also with multiple areas of pelvic fluid as well showing generalized inflammation tracking to the mid abdomen. Interval decompression of the abscess in the cul-de-sac following drain placement. Extensive body wall edema.   Surgeries / Procedures:  9/8 Robotic assisted laparoscopic lysis of adhesions and drainage of intra-abdominal abscess WITHOUT evidence of perforation  Central access: 11/19/19 TPN start date: 11/19/19  Nutritional Goals (per RD recommendation on 9/7 and 9/9 --calorie goal slightly decreased but overall the same): kCal: 2600 - 2800/day, Protein:  130 - 140g/day, Fluid: > 2000 mL Goal TPN rate is 100 mL/hr (provides 140 g of protein and 2800 kcals per day)  Current Nutrition:  NPO   Plan:   Continue TPN @ 100 mL/hr at 1800 (full rate)  Nutritional components: 15.6 % dextrose, 53 g/L amino acids, 31 g/L lipids  Electrolytes in TPN: 39mq/L of Na, 532m/L of K, 52m66mL of Ca, 52mE87m of Mg, and 20 mmol/L of Phos. Cl:Ac ratio 1:1  Add standard MVI and trace elements to TPN  continue Moderate q4h SSI and adjust as needed - will increase to 15 units insulin regular to TPN  Fluid goal met with TPN at full rate  Monitor TPN labs daily until stable then on Mon/Thurs. Patient is at risk for re-feeding.   BailJacobo ForestrmD Candidate 2022 11/23/2019 9:38 AM

## 2019-11-23 NOTE — Progress Notes (Signed)
0900 Patient moved to new bariatric bed that actually works correctly. Moving required 4 staff member and the slide board. Patient medicated for pain post move.

## 2019-11-23 NOTE — Progress Notes (Signed)
Cross Cover Note Informed of "[employer] [place] score and patient being inappropriate for floor by Stefano Gaul RN..  M<orbidly obese femal s/probotic drain placement for complicated diverticulitis.   She is currently very sleepy for receiving pain meds.  She is able to awake briefly and answer questions. She denies pain.  Her heart rate around 100.  Short shallow breathing pattern but denies repiratory distress and oxygen saturations stable..  She appears to be oversedated with pain meds and although Incentive spirometer ordered, patient canot endorse she has performed and no equipment in room Discussed with RN -  decrease pain meds, IS every hour while awake.  Need to increase mobility asap.  - high risk HCAP Continue CPAP for OSA

## 2019-11-23 NOTE — Progress Notes (Signed)
Date of Admission:  11/09/2019    34 y.o. female with a history of hypertension and morbid obesity, cholecystectomy for choledocholithiasis in 2014 presented to the ED on 11/09/2019 with persistent generalized abdominal pain that began 2 to 3 weeks ago but was getting worse in the past 2 days.  She also was complaining of not able to be urinate.  She did not have any headache or fever chest pain cough shortness of breath vomiting diarrhea on presentation.  In the ED her vitals were temperature of 98.6, blood pressure 119/98, heart rate of 130 and respiratory rate of 24.  Her weight was 360 pounds. Labs revealed a WBC of 13.5, platelet of 489, creatinine of 1.75, glucose of 180 and lactate was 3.1. Chest x-ray was negative CT abdomen was concerning for acute perforated diverticulitis. General surgery had seen her on 11/10/2019 and recommended conservative management initially.  She was also started on IV ciprofloxacin and Flagyl. She underwent CT-guided percutaneous drainage placement in the abscess in the cul-de-sac on 11/13/2019. The culture was E. coli and group C strep. As the patient was continuing to have fever and leukocytosis a repeat CT scan was done on 11/17/2019 Which showed numerous interloop abscess in the lower abdomen with the largest measuring 8 into 6.5 cm with gas and fluid in the area.  There was signs of colonic perforation related to the diverticulitis with worsening fluid and gas containing abscess throughout the lower abdomen and pelvis.. Patient was taken for surgery on 11/18/2019 and underwent robotic lysis of adhesions with irrigation and debridement of multiple intra-abdominal peritoneal and pelvic abscesses with drain placement.Saw Her on 11/19/19 and started meropenem ( because of PCn allergy and not responding on cipro and flagyl and need for enterococcus coverage)  and anidulafungin   ID: Amanda Reyes is a 34 y.o. female  Active Problems:   Diverticulitis of colon with  perforation    Subjective: Says she is feeling better Mom at bed side  Medications:  . sodium chloride   Intravenous Once  . chlorhexidine  15 mL Mouth Rinse BID  . Chlorhexidine Gluconate Cloth  6 each Topical Q0600  . enoxaparin (LOVENOX) injection  40 mg Subcutaneous Q12H  . insulin aspart  0-15 Units Subcutaneous Q4H  . magnesium hydroxide  30 mL Oral Daily  . mouth rinse  15 mL Mouth Rinse q12n4p  . pantoprazole (PROTONIX) IV  40 mg Intravenous Q12H  . polyethylene glycol  17 g Oral Daily  . sodium chloride flush  10-40 mL Intracatheter Q12H  . sodium chloride flush  5 mL Intracatheter Q8H    Objective: Vital signs in last 24 hours: Temp:  [97.6 F (36.4 C)-100.3 F (37.9 C)] 98.6 F (37 C) (09/13 1200) Pulse Rate:  [101-138] 119 (09/13 1300) Resp:  [20-35] 32 (09/13 1100) BP: (126-177)/(92-117) 150/103 (09/13 1300) SpO2:  [97 %-100 %] 100 % (09/13 1300)  PHYSICAL EXAM:  General: lethargic Responds to questions appropriately Head: Normocephalic, without obvious abnormality, atraumatic. Eyes: rt conjuctiva congested ENT Nares normal. No drainage or sinus tenderness. Lips, mucosa, and tongue normal. No Thrush Neck: Supple, symmetrical, no adenopathy, thyroid: non tender no carotid bruit and no JVD. Back: No CVA tenderness. Lungs:b/l air entry Heart: TAchycardia. Abdomen: Soft, distended.rt lower quadrant drain and back drain Extremities: atraumatic, no cyanosis. No edema. No clubbing Skin: No rashes or lesions. Or bruising Lymph: Cervical, supraclavicular normal. Neurologic: Grossly non-focal  Lab Results Recent Labs    11/21/19 0616 11/21/19 1230 11/22/19  [id number] 11/23/19 0543  WBC  --  19.7*  --  15.2*  HGB  --  7.6*  --  7.0*  HCT  --  22.5*  --  22.6*  NA   < >  --  144 145  K   < >  --  4.8 4.4  CL   < >  --  109 107  CO2   < >  --  30 32  BUN   < >  --  16 13  CREATININE   < >  --  0.71 0.58   < > = values in this interval not displayed.    Liver Panel Recent Labs    11/23/19 0543  PROT 6.5  ALBUMIN 1.5*  AST 22  ALT 12  ALKPHOS 62  BILITOT 0.4   Sedimentation Rate No results for input(s): ESRSEDRATE in the last 72 hours. C-Reactive Protein No results for input(s): CRP in the last 72 hours.  Microbiology:  Studies/Results: No results found.   Assessment/Plan: Perforated diverticulitis with extensive intra-abdominal loop abscesses Status post laparoscopy and washout.on 11/18/19 Patient has been on ciprofloxacin and Flagyl since 11/09/2019 but continues to spike fever with leukocytosis. Because of extensive abscess she is currently on meropenem anidulafungin.  The leukocytosis is increasing.  We will need to keep a close eye on it as she may continue to have abscesses secondary to the perforated diverticulitis.  May need repeat imaging if leukocytosis worsens  Extubated.  Morbid obesity  Discussed the management with the mother and her nurse

## 2019-11-23 NOTE — Care Management (Signed)
This is a no charge note   Pick up from PCCM, Dr. Belia Heman  34 year old lady with past medical history for morbid obesity and hypertension, admitted to ICU due to acute diverticulitis and microperforation.  Patient is s/p of robotic surgery for lysis of adhesions. Pt is extubated, currently on BiPAP.  Hemodynamically stabilized.  General surgeon is primary team. We are asked to pick up this pt tomorrow morning and do consult.    Amanda Harp, MD  [facility]   If 7PM-7AM, please contact night-coverage [email or web address] 11/23/2019, 11:08 AM

## 2019-11-23 NOTE — Progress Notes (Addendum)
[facility] SURGICAL PROGRESS NOTE  Hospital Day(s): 14.   Post op day(s): 5 Days Post-Op.   Interval History:  Patient seen and examined no acute events or new complaints overnight.  She remains on BiPAP as needed, on [place] this morning, remains off vasopressors Patient looks better this morning, she reports that her abdominal pain is improved No nausea or emesis Leukocytosis is improved to 15.2K; no fevers Hgb 7.0; no evidence of bleeding Renal function normal with sCr - 0.58, good UO - 7.7 Hypophosphatemia to 2.0, otherwise no significant electrolyte derangement NGT output recorded at 1.2L in last 24 hours; she had previously been having frequent ice chips/water Surgical drain with 60 ccs out in last 24 hours She is having bowel function and BM recorded Not mobilizing  Vital signs in last 24 hours: [min-max] current  Temp:  [97.6 F (36.4 C)-100.3 F (37.9 C)] 99.2 F (37.3 C) (09/13 0400) Pulse Rate:  [116-138] 116 (09/13 0600) Resp:  [17-44] 25 (09/13 0600) BP: (132-177)/(92-117) 163/111 (09/13 0600) SpO2:  [95 %-100 %] 100 % (09/13 0600)     Height: 5\' 9"  (175.3 cm) Weight: (!) 211 kg BMI (Calculated): 68.66   Intake/Output last 2 shifts:  09/12 0701 - 09/13 0700 In: 656.4 [IV Piggyback:656.4] Out: 8960 [Urine:7700; Emesis/NG output:1200; Drains:60]   Physical Exam:  Constitutional: alert, cooperative and no distress  HEENT: NGT in place Respiratory: breathing non-labored at rest, on [place] Cardiovascular: Tachycardic and sinus rhythm  Gastrointestinal: Obese, soft, no appreciable tenderness, non-distended, JP in RLQ with seropurulent drainage Genitourinary: Foley in place Musculoskeletal: 1+ edema to bilateral LE Integumentary: Laparoscopic incisions are CDI with dermabond, no erythema or drainage   Labs:  CBC Latest Ref Rng & Units 11/23/2019 11/21/2019 11/20/2019  WBC 4.0 - 10.5 K/uL 15.2(H) 19.7(H) 23.5(H)  Hemoglobin 12.0 - 15.0 g/dL 7.0(L) 7.6(L)  8.2(L)  Hematocrit 36 - 46 % 22.6(L) 22.5(L) 24.0(L)  Platelets 150 - 400 K/uL 236 236 319   CMP Latest Ref Rng & Units 11/23/2019 11/22/2019 11/21/2019  Glucose 70 - 99 mg/dL 01/21/2020) 725(D) 664(Q)  BUN 6 - 20 mg/dL 13 16 20   Creatinine 0.44 - 1.00 mg/dL 034(V 4.25  Sodium 135 - 145 mmol/L 145 144 141  Potassium 3.5 - 5.1 mmol/L 4.4 4.8 4.0  Chloride 98 - 111 mmol/L 107 109 106  CO2 22 - 32 mmol/L 32 30 27  Calcium 8.9 - 10.3 mg/dL 7.3(L) 7.1(L) 6.9(L)  Total Protein 6.5 - 8.1 g/dL 6.5 - -  Total Bilirubin 0.3 - 1.2 mg/dL 0.4 - -  Alkaline Phos 38 - 126 U/L 62 - -  AST 15 - 41 U/L 22 - -  ALT 0 - 44 U/L 12 - -     Imaging studies: No new pertinent imaging studies   Assessment/Plan:  34 y.o. female overall slowly improving 5 Days Post-Op s/p robotic assisted laparoscopic lysis of adhesion and drainage of intra-abdominal abscess WITHOUT evidence of perforation or frank stoolforperforated diverticulitis, complicated by hypoxic respiratory failure requiring post-operative intubation.    - Continue NPO + TPN - Continue NGT for now; LIS; limit PO intake (minimal ice chips for comfort); if output slows we can do clamping trial prior to removal - Continue IV ABx; switched to Meropenem, andifungalin added; ID on board              - No plans for surgical re-intervention at this time - Monitor abdominal examination; on-going bowel function - Pain control prn; antiemetics prn -  Monitor leukocytosis, renal function - Home medications -Mobilization encouraged as tolerated - DVT prophylaxis; okay to resume    - Okay for transfer to floor; discussed with PCCM MD; hospitalist to consult for comorbidities   All of the above findings and recommendations were discussed with the patient, and the medical team, and all of patient's questions were answered to her expressed satisfaction.  -- Lynden Oxford, PA-C [facility] 11/23/2019, 7:59 AM [phone] M-F: 7am - 4pm

## 2019-11-23 NOTE — Progress Notes (Signed)
°   11/23/19 1200  Clinical Encounter Type  Visited With Family  Visit Type Follow-up  Referral From Chaplain  Consult/Referral To Chaplain  Chaplain spoke to pt's mother several times this morning. Finally pt's mother asked if she could go back to her daughter's room. Chaplain went back to see if mother could come back and staff told her yes. Chaplain went and told mother that she could go back.

## 2019-11-24 ENCOUNTER — Encounter: Payer: Self-pay | Admitting: Surgery

## 2019-11-24 ENCOUNTER — Inpatient Hospital Stay: Payer: BC Managed Care – PPO

## 2019-11-24 LAB — BASIC METABOLIC PANEL
Anion gap: 5 (ref 5–15)
BUN: 13 mg/dL (ref 6–20)
CO2: 35 mmol/L — ABNORMAL HIGH (ref 22–32)
Calcium: 7.5 mg/dL — ABNORMAL LOW (ref 8.9–10.3)
Chloride: 100 mmol/L (ref 98–111)
Creatinine, Ser: 0.5 mg/dL (ref 0.44–1.00)
GFR calc Af Amer: >60 mL/min (ref >60)
GFR calc non Af Amer: >60 mL/min (ref >60)
Glucose, Bld: 229 mg/dL — ABNORMAL HIGH (ref 70–99)
Potassium: 4.7 mmol/L (ref 3.5–5.1)
Sodium: 140 mmol/L (ref 135–145)

## 2019-11-24 LAB — GLUCOSE, CAPILLARY
Glucose-Capillary: 179 mg/dL — ABNORMAL HIGH (ref 70–99)
Glucose-Capillary: 192 mg/dL — ABNORMAL HIGH (ref 70–99)
Glucose-Capillary: 213 mg/dL — ABNORMAL HIGH (ref 70–99)
Glucose-Capillary: 213 mg/dL — ABNORMAL HIGH (ref 70–99)
Glucose-Capillary: 229 mg/dL — ABNORMAL HIGH (ref 70–99)
Glucose-Capillary: 231 mg/dL — ABNORMAL HIGH (ref 70–99)
Glucose-Capillary: 232 mg/dL — ABNORMAL HIGH (ref 70–99)

## 2019-11-24 LAB — CBC
HCT: 23.3 % — ABNORMAL LOW (ref 36.0–46.0)
Hemoglobin: 7.1 g/dL — ABNORMAL LOW (ref 12.0–15.0)
MCH: 24.5 pg — ABNORMAL LOW (ref 26.0–34.0)
MCHC: 30.5 g/dL (ref 30.0–36.0)
MCV: 80.3 fL (ref 80.0–100.0)
Platelets: 233 10*3/uL (ref 150–400)
RBC: 2.9 MIL/uL — ABNORMAL LOW (ref 3.87–5.11)
RDW: 20.2 % — ABNORMAL HIGH (ref 11.5–15.5)
WBC: 15.8 10*3/uL — ABNORMAL HIGH (ref 4.0–10.5)
nRBC: 0.4 % — ABNORMAL HIGH (ref 0.0–0.2)

## 2019-11-24 LAB — MAGNESIUM: Magnesium: 1.9 mg/dL (ref 1.7–2.4)

## 2019-11-24 LAB — PHOSPHORUS: Phosphorus: 2.1 mg/dL — ABNORMAL LOW (ref 2.5–4.6)

## 2019-11-24 MED ORDER — HYDROMORPHONE HCL 1 MG/ML IJ SOLN
0.5000 mg | INTRAMUSCULAR | Status: DC | PRN
  Administered 2019-11-24 – 2019-12-02 (×65): 1 mg via INTRAVENOUS
  Filled 2019-11-24 (×68): qty 1

## 2019-11-24 MED ORDER — KETOROLAC TROMETHAMINE 30 MG/ML IJ SOLN
30.0000 mg | Freq: Four times a day (QID) | INTRAMUSCULAR | Status: AC | PRN
  Administered 2019-11-24 – 2019-11-28 (×7): 30 mg via INTRAVENOUS
  Filled 2019-11-24 (×7): qty 1

## 2019-11-24 MED ORDER — MAGNESIUM HYDROXIDE 400 MG/5ML PO SUSP: 30.0000 mL | Freq: Every day | ORAL | Status: DC

## 2019-11-24 MED ORDER — IOHEXOL 300 MG/ML  SOLN
125.0000 mL | Freq: Once | INTRAMUSCULAR | Status: AC | PRN
  Administered 2019-11-25: 125 mL via INTRAVENOUS

## 2019-11-24 MED ORDER — POLYETHYLENE GLYCOL 3350 17 G PO PACK: 17.0000 g | PACK | Freq: Every day | ORAL | Status: DC

## 2019-11-24 MED ORDER — TRAVASOL 10 % IV SOLN
INTRAVENOUS | Status: AC
  Filled 2019-11-24: qty 1272

## 2019-11-24 MED ORDER — FUROSEMIDE 10 MG/ML IJ SOLN
INTRAMUSCULAR | Status: AC
  Filled 2019-11-24: qty 4

## 2019-11-24 MED ORDER — POLYVINYL ALCOHOL 1.4 % OP SOLN
1.0000 [drp] | OPHTHALMIC | Status: DC | PRN
  Administered 2019-11-24 – 2019-11-25 (×2): 1 [drp] via OPHTHALMIC
  Filled 2019-11-24: qty 15

## 2019-11-24 MED ORDER — POTASSIUM & SODIUM PHOSPHATES 280-160-250 MG PO PACK
2.0000 | PACK | Freq: Once | ORAL | Status: AC
  Administered 2019-11-24: 2 via NASOGASTRIC
  Filled 2019-11-24: qty 2

## 2019-11-24 MED ORDER — LORAZEPAM 2 MG/ML IJ SOLN
2.0000 mg | Freq: Four times a day (QID) | INTRAMUSCULAR | Status: DC | PRN
  Administered 2019-11-24 – 2019-12-20 (×4): 2 mg via INTRAVENOUS
  Filled 2019-11-24 (×4): qty 1

## 2019-11-24 MED ORDER — INSULIN GLARGINE 100 UNIT/ML ~~LOC~~ SOLN
20.0000 [IU] | Freq: Once | SUBCUTANEOUS | Status: AC
  Administered 2019-11-24: 20 [IU] via SUBCUTANEOUS
  Filled 2019-11-24: qty 0.2

## 2019-11-24 MED ORDER — FUROSEMIDE 10 MG/ML IJ SOLN
40.0000 mg | Freq: Once | INTRAMUSCULAR | Status: AC
  Administered 2019-11-24: 40 mg via INTRAVENOUS

## 2019-11-24 MED ORDER — FUROSEMIDE 10 MG/ML IJ SOLN
40.0000 mg | Freq: Two times a day (BID) | INTRAMUSCULAR | Status: DC
  Administered 2019-11-24 – 2019-11-30 (×12): 40 mg via INTRAVENOUS
  Filled 2019-11-24 (×12): qty 4

## 2019-11-24 MED ORDER — IPRATROPIUM-ALBUTEROL 0.5-2.5 (3) MG/3ML IN SOLN
3.0000 mL | RESPIRATORY_TRACT | Status: DC | PRN
  Administered 2019-11-24: 3 mL via RESPIRATORY_TRACT
  Filled 2019-11-24: qty 3

## 2019-11-24 MED ORDER — IOHEXOL 9 MG/ML PO SOLN
500.0000 mL | ORAL | Status: AC
  Administered 2019-11-24 (×2): 500 mL via ORAL

## 2019-11-24 MED ORDER — INSULIN ASPART 100 UNIT/ML ~~LOC~~ SOLN
0.0000 [IU] | SUBCUTANEOUS | Status: DC
  Administered 2019-11-24 (×2): 4 [IU] via SUBCUTANEOUS
  Administered 2019-11-24: 7 [IU] via SUBCUTANEOUS
  Administered 2019-11-25 – 2019-11-26 (×7): 4 [IU] via SUBCUTANEOUS
  Administered 2019-11-26: 7 [IU] via SUBCUTANEOUS
  Administered 2019-11-26 – 2019-11-27 (×6): 4 [IU] via SUBCUTANEOUS
  Filled 2019-11-24 (×17): qty 1

## 2019-11-24 NOTE — Progress Notes (Signed)
Patient continues to c/o abd. Pain, not relieved with Dilaudid; RR 34-36/min; O2 sat 97-100% on 3.5 L ; asked to be taken off Bipap d/t intolerance, ".. I can't breathe..."; Manuela Schwartz, NP notified of situation; awaiting acknowledgement. MEWS RED since transfer from ICU. Charge nurse, Gayla Medicus, RN and Supervisor also aware. Windy Carina, RN 4:20 AM 11/24/2019

## 2019-11-24 NOTE — Treatment Plan (Signed)
Unable to get CT per transport. CT has not notified RN of this at this time. Awaiting notification. MD Rodenberg made aware.

## 2019-11-24 NOTE — Progress Notes (Signed)
[facility] SURGICAL PROGRESS NOTE  Hospital Day(s): 15.   Post op day(s): 6 Days Post-Op.   Interval History:  Patient seen and examined Transferred out of ICU on 09/13, o/w no acute events overnight, only issues with pain.  Patient reports she is have LLQ abdominal pina, worse compared to yesterday morning, some relief with pain medications She is very frustrated with not being able to move and going to the bathroom on herself.  No nausea, or emesis Leukocytosis remains stable at 15.8K, no fevers Stable anemia to 7.1; no evidence of active bleeding Renal function normal, sCr - 0.50 Mild hypophosphatemia to 2.1; o/w no electrolyte derangement Making urine; UO - 4.8L; responded to Lasix  NGT with 1L out in last 24 hours Surgical drain with 100 ccs out in last 24 hours; seropurulent Not mobilizing  Vital signs in last 24 hours: [min-max] current  Temp:  [97.8 F (36.6 C)-100 F (37.8 C)] 99.8 F (37.7 C) (09/14 0358) Pulse Rate:  [101-129] 115 (09/14 0524) Resp:  [21-38] 32 (09/14 0530) BP: (138-177)/(93-121) 149/104 (09/14 0524) SpO2:  [96 %-100 %] 99 % (09/14 0503)     Height: 5\' 9"  (175.3 cm) Weight: (!) 211 kg BMI (Calculated): 68.66   Intake/Output last 2 shifts:  09/13 0701 - 09/14 0700 In: 2310.2 [I.V.:1527.2; IV Piggyback:778] Out: 5985 [Urine:4825; Emesis/NG output:1050; Drains:110]   Physical Exam:  Constitutional: alert, cooperative and no distress  HEENT: NGT in place Respiratory: breathing non-labored at rest, on [place] Cardiovascular: Tachycardic and sinus rhythm  Gastrointestinal: Obese, soft, no appreciable tenderness, non-distended, JP in RLQ with seropurulent drainage Genitourinary: Foley in place Musculoskeletal: 1+ edema to bilateral LE Integumentary: Laparoscopic incisions are CDI with dermabond, no erythema or drainage    Labs:  CBC Latest Ref Rng & Units 11/24/2019 11/23/2019 11/21/2019  WBC 4.0 - 10.5 K/uL 15.8(H) 15.2(H) 19.7(H)   Hemoglobin 12.0 - 15.0 g/dL 7.1(L) 7.0(L) 7.6(L)  Hematocrit 36 - 46 % 23.3(L) 22.6(L) 22.5(L)  Platelets 150 - 400 K/uL 233 236 236   CMP Latest Ref Rng & Units 11/23/2019 11/22/2019 11/21/2019  Glucose 70 - 99 mg/dL 01/21/2020) 759(F) 638(G)  BUN 6 - 20 mg/dL 13 16 20   Creatinine 0.44 - 1.00 mg/dL 665(L 9.35  Sodium 135 - 145 mmol/L 145 144 141  Potassium 3.5 - 5.1 mmol/L 4.4 4.8 4.0  Chloride 98 - 111 mmol/L 107 109 106  CO2 22 - 32 mmol/L 32 30 27  Calcium 8.9 - 10.3 mg/dL 7.3(L) 7.1(L) 6.9(L)  Total Protein 6.5 - 8.1 g/dL 6.5 - -  Total Bilirubin 0.3 - 1.2 mg/dL 0.4 - -  Alkaline Phos 38 - 126 U/L 62 - -  AST 15 - 41 U/L 22 - -  ALT 0 - 44 U/L 12 - -     Imaging studies: No new pertinent imaging studies   Assessment/Plan: 34 y.o. female 6 Days Post-Op s/p robotic assisted laparoscopic lysis of adhesion and drainage of intra-abdominal abscess WITHOUT evidence of perforation or frank stoolforperforated diverticulitis, complicated by hypoxic respiratory failure requiring post-operative intubation.   - Will repeat CT Abdomen/Pelvis this morning  - Clamp NGT; removal pending CT results   - Continue IV ABx; switched to Meropenem, andifungalin added; ID on board - No plans for surgical re-intervention at this time - Monitor abdominal examination; on-going bowel function - Pain control prn; antiemetics prn - Monitor leukocytosis, renal function - Home medications -Mobilization encouraged as tolerated - DVT prophylaxis; okay to resume  All of the above findings and recommendations were discussed with the patient, patient's family (mother at bedside), and the medical team, and all of patient's and family's questions were answered to their expressed satisfaction.  -- Lynden Oxford, PA-C [facility] 11/24/2019, 7:13 AM [phone] M-F: 7am - 4pm

## 2019-11-24 NOTE — Progress Notes (Signed)
Pt refusing to wear Bipap for HS. Bipap remains at bedside. RN aware.

## 2019-11-24 NOTE — Evaluation (Signed)
Physical Therapy Evaluation Patient Details Name: Amanda Reyes MRN: [id number] DOB: 03/25/1985 Today's Date: 11/24/2019   History of Present Illness  Pt is 34 year old female with past medical history for morbid obesity and hypertension, admitted to ICU due to acute diverticulitis and microperforation.  Patient is s/p of robotic surgery for lysis of adhesions.     Clinical Impression  Pt received in supine position and agreeable to therapy.  Mother present in room upon arrival and verbally upset with care given to her by hospital.  Pt able to communicate with therapist for some of the living situation portion of evaluation, however mother stepped in and communicated with therapist for accuracy.  Pt writhing in pain and has trouble speaking in between moans.  Pt is able to listen to commands and was able to perform bed-level exercises.  Pt encouraged to continue to perform bed-level exercises when therapy is not present to maintain muscle strength in the LEs.  Pt has difficulty with RLE in performing exercises and is noticeably weaker than the L.  Pt mother reports it is weaker due to pt having her bladder removed and pt endorses this statement.  Current recommendation for d/c at this time are to SNF due to caregiver assistance needed.  Pt will continue to benefit from skilled therapy in order to address current deficits listed below.       Follow Up Recommendations SNF    Equipment Recommendations  Other (comment);3in1 (PT) (Bariatric walker.)    Recommendations for Other Services       Precautions / Restrictions Restrictions Weight Bearing Restrictions: No      Mobility  Bed Mobility               General bed mobility comments: Unable to assess at this time due to pain and pt inability to perform.  Transfers                    Ambulation/Gait                Stairs            Wheelchair Mobility    Modified Rankin (Stroke Patients Only)        Balance                                             Pertinent Vitals/Pain Pain Assessment: Faces Faces Pain Scale: Hurts worst Pain Location: Abdomen Pain Descriptors / Indicators: Constant;Discomfort;Grimacing Pain Intervention(s): Limited activity within patient's tolerance;RN gave pain meds during session    Home Living Family/patient expects to be discharged to:: Private residence Living Arrangements: Alone Available Help at Discharge: Family Type of Home: Apartment Home Access: Level entry     Home Layout: One level Home Equipment: None      Prior Function Level of Independence: Independent               Hand Dominance   Dominant Hand: Right    Extremity/Trunk Assessment   Upper Extremity Assessment Upper Extremity Assessment: Generalized weakness    Lower Extremity Assessment Lower Extremity Assessment: Generalized weakness;RLE deficits/detail RLE Deficits / Details: Pt unable to control RLE as well as left.  Noting considerable amount of weaknes in the RLE. RLE Sensation: WNL       Communication   Communication: No difficulties (some difficulty speaking due to pain levels in abdomen.)  Cognition Arousal/Alertness: Awake/alert Behavior During Therapy: WFL for tasks assessed/performed;Restless Overall Cognitive Status: Within Functional Limits for tasks assessed                                 General Comments: Pt willing to partcipate in therapy, but in a lot of pain, limiting ability to participate.      General Comments      Exercises Total Joint Exercises Ankle Circles/Pumps: AROM;Strengthening;Both;10 reps;Supine Quad Sets: AROM;Strengthening;Both;10 reps;Supine Gluteal Sets: AROM;Strengthening;Both;10 reps;Supine Heel Slides: AROM;Strengthening;Both;10 reps;Supine Hip ABduction/ADduction: AROM;Strengthening;Both;10 reps;Supine Straight Leg Raises: Other (comment) (Attempted to perform, but unable to  complete.) Knee Flexion: AROM;Strengthening;Both;10 reps;Supine Other Exercises Other Exercises: Pt and mother educated onrole of PT and services provided during stay in hospital. Other Exercises: Pt educated on importance of bed mobility and bed-level exercises in order to complete transfer training in the future.   Assessment/Plan    PT Assessment Patient needs continued PT services  PT Problem List Decreased strength;Decreased range of motion;Decreased activity tolerance;Decreased balance;Decreased mobility;Decreased safety awareness;Obesity;Pain       PT Treatment Interventions DME instruction;Gait training;Functional mobility training;Therapeutic activities;Therapeutic exercise;Balance training;Patient/family education    PT Goals (Current goals can be found in the Care Plan section)  Acute Rehab PT Goals Patient Stated Goal: Go home. PT Goal Formulation: With patient/family Time For Goal Achievement: 12/08/19 Potential to Achieve Goals: Fair    Frequency Min 2X/week   Barriers to discharge        Co-evaluation               AM-PAC PT "6 Clicks" Mobility  Outcome Measure Help needed turning from your back to your side while in a flat bed without using bedrails?: Total Help needed moving from lying on your back to sitting on the side of a flat bed without using bedrails?: Total Help needed moving to and from a bed to a chair (including a wheelchair)?: Total Help needed standing up from a chair using your arms (e.g., wheelchair or bedside chair)?: Total Help needed to walk in hospital room?: Total Help needed climbing 3-5 steps with a railing? : Total 6 Click Score: 6    End of Session   Activity Tolerance: Patient limited by pain Patient left: in bed;with call bell/phone within reach;with bed alarm set;with nursing/sitter in room;with family/visitor present;with SCD's reapplied Nurse Communication: Mobility status PT Visit Diagnosis: Other abnormalities of gait  and mobility (R26.89);Muscle weakness (generalized) (M62.81);Difficulty in walking, not elsewhere classified (R26.2);Pain Pain - Right/Left: Right Pain - part of body:  (Right side of abdomen.)    Time: 6256-3893 PT Time Calculation (min) (ACUTE ONLY): 29 min   Charges:   PT Evaluation $PT Eval Low Complexity: 1 Low PT Treatments $Gait Training: 8-22 mins        Nolon Bussing, PT, DPT 11/24/19, 12:00 PM

## 2019-11-24 NOTE — Consult Note (Signed)
Pharmacy Antibiotic Note  Amanda Reyes is a 34 y.o. female admitted on 11/09/2019 with intra-abdominal abscess. Patient is status post laparoscopic lysis of adhesions and drainage of intra-abdominal abscess with further placement of RLQ drain on 9/8. Hospital course complicated by post-operative respiratory distress requiring intubation 9/8. Patient was on empiric antimicrobial coverage with ciprofloxacin + metronidazole since 8/30. Abscess cultures from 9/3 with pan-sensitive E coli + Group C Streptococcus. On POD #1 patient with persistent fevers to 102.5. Antimicrobials now broadened to anidulafungin and meropenem due to persistent abscesses. Pharmacy has been consulted for meropenem dosing.   Plan:  Continue meropenem 1 gram IV every 8 hours   Continue to monitor renal function and adjust antibiotics as indicated.  Height: 5\' 9"  (175.3 cm) Weight: (!) 211 kg (465 lb 2.7 oz) IBW/kg (Calculated) : 66.2  Temp (24hrs), Avg:98.9 F (37.2 C), Min:97.8 F (36.6 C), Max:99.8 F (37.7 C)  Recent Labs  Lab 11/19/19 0436 11/19/19 0436 11/20/19 0426 11/21/19 0616 11/21/19 1230 11/22/19 0443 11/23/19 0543 11/24/19 0635  WBC 17.4*  --  23.5*  --  19.7*  --  15.2* 15.8*  CREATININE 0.97   < > 1.08* 0.87  --  0.71 0.58 0.50   < > = values in this interval not displayed.    Estimated Creatinine Clearance: 196 mL/min (by C-G formula based on SCr of 0.5 mg/dL).    Allergies  Allergen Reactions  . Penicillins Hives    Did it involve swelling of the face/tongue/throat, SOB, or low BP? No Did it involve sudden or severe rash/hives, skin peeling, or any reaction on the inside of your mouth or nose? Yes Did you need to seek medical attention at a hospital or doctor's office? No When did it last happen? If all above answers are "NO", may proceed with cephalosporin use..3  . Tomato Rash    Antimicrobials this admission: Ciprofloxacin 8/30 >> 9/9 Metronidazole 8/30 >> 9/9 Meropenem  9/9 >>  Anidulafungin 9/9 >>   Microbiology results: 8/30 BCx: negative 8/30 UCx: no growth  9/3 Abscess Cx: pan-sensitive E coli + Group C Streptococcus 9/8 MRSA PCR: negative  Thank you for allowing pharmacy to be a part of this patient's care.  11/8 11/24/2019 12:32 PM

## 2019-11-24 NOTE — Progress Notes (Signed)
Nutrition Follow-up  DOCUMENTATION CODES:   Morbid obesity  INTERVENTION:   TPN per pharmacy  Daily weights   NUTRITION DIAGNOSIS:   Inadequate oral intake related to inability to eat as evidenced by NPO status. Ongoing  GOAL:   Patient will meet greater than or equal to 90% of their needs -met with TPN  MONITOR:   Diet advancement, Labs, Weight trends, Skin, I & O's, Other (Comment) (TPN)  ASSESSMENT:   34 year old female with PMHx of HTN admitted with perforated diverticulitis with contained abscesses.  9/8 s/p robotic lysis of adhesions with irrigation and debridement of multiple intra-abdominal/peritoneal/pelvis abscesses with drain placement  Pt extubated 9/10. Pt tolerating TPN well at goal rate. Pt continues to refeed; electrolytes being managed by pharmacy. Pt continues to be NPO. NGT in place with 1065m output. Drains in place with 1126moutput. No new weight since 9/8; will request daily weights. Pt noted to have moderate edema. Pt given lasix; UOP [id number] is not mobilizing. Plan is for CT abdomen today and possible clamp trial.   Medications reviewed and include: lovenox, lasix, insulin, protonix, miralax, KPhos, meropenem  Labs reviewed: K 4.7 wnl, P 2.1(L), Mg 1.9 wnl Triglycerides 103- 9/13 Pre-albumin- 6.6(L)- 9/13 Wbc- 15.8(H), Hgb 7.1(L), Hct 23.3(L) cbgs- 231, 213, 213 x 24 hrs  Diet Order:   Diet Order            Diet NPO time specified  Diet effective now                EDUCATION NEEDS:   No education needs have been identified at this time  Skin:  Skin Assessment: Skin Integrity Issues: (closed incisions to abdomen and vagina)  Last BM:  9/14- type 6  Height:   Ht Readings from Last 1 Encounters:  11/09/19 '5\' 9"'  (1.753 m)   Weight:   Wt Readings from Last 1 Encounters:  11/18/19 (!) 211 kg   Ideal Body Weight:  65.9 kg  BMI:  Body mass index is 68.69 kg/m.  Estimated Nutritional Needs:   Kcal:   2700-3000kcal/day  Protein:  >135g/day  Fluid:  >/= 2 L/day  CasKoleen Distance, RD, LDN Please refer to [facility] RD and/or RD on-call/weekend/after hours pager

## 2019-11-24 NOTE — Progress Notes (Signed)
PROGRESS NOTE    Amanda Reyes   XKG:[id number]  DOB: 15-Dec-1985  PCP: Patient, No Pcp Per    DOA: 11/09/2019 LOS: 15   Brief Narrative   Hospital Course to Date Summarized: "34 yo morbidly obese female with complicated case of acute diverticulitis. Patient had perc drains placed 11/17/2019, symptoms of fevers and abd pain persisted.Patient underwent robotic surgery 9/8  with pus pockets and lysis of adhesions, there was NO overt perforation of fecal matter or intraperitoneal soilage.  Postoperatively patient was unable to wean from the ventilator due to severe hypoventilation from morbid obesity (BMI 68) and metabolic acidosis."  (from PCCM Dr. Jayme Cloud)  Patient was eventually extubated on 9/10, requiring mandatory BiPAP overnight and as needed throughout the day.      9/14 - POD 6, hospital day 15.  Patient out of ICU, [facility] consulting with surgery as primary.  Patient reports diffuse uncontrolled pain all over when seen today, does not localize or provide specifics.      Assessment & Plan   Active Problems:   Diverticulitis of colon with perforation   Diverticulitis of large intestine with perforation and abscess   Severe Sepsis due to acute complicated diverticulitis - severe sepsis was POA as evidenced by leukocytosis, fevers, AKI reflecting organ dysfunction in the setting of diverticulitis.   --ID following, surgery primary --cultures with E. Coli and group C strep --on meropenem and Eraxis antifungal --sepsis physiology has improved --on TPN  Acute Hypoxic and Hypercapnic Respiratory Failure - severe, post-operatively 9/8, remained intubated in ICU.  Extubated 9/10. Obesity Hypoventilation Syndrome & most likely Severe Sleep Apnea --CPAP at night and PRN during day --Likely has severe underlying OSA and OHS --Needs to mobilize as much as possible, complicated by body habitus --Consider echo when pain better controlled  Type 2 Diabetes with Hyperglycemia - currently on  resistant sliding scale Novolog q4h,  Diabetes coordinator following.  Also getting insulin in TPN  Edema - on TPN, and suspect underlying diastolic dysfunction and/or pulmonary hypertension.  Agree with diuresis.  Follow renal function and electrolytes.    Acute Kidney Injury - POA due to severe sepsis. Resolved.  Monitor BMP.   Severe morbid obesity: Body mass index is 68.69 kg/m.  Highly complicates overall care and prognosis.  Has had above secondary respiratory complications post-operatively.  DVT prophylaxis: SCD's Start: 11/23/19 1508 enoxaparin (LOVENOX) injection 40 mg Start: 11/19/19 2200 Place and maintain sequential compression device Start: 11/17/19 1017   Diet:  Diet Orders (From admission, onward)    Start     Ordered   11/23/19 1508  Diet NPO time specified  Diet effective now        11/23/19 1507            Code Status: Full Code    Subjective 11/24/19    Patient seen this AM during patient care.  She is moaning loudly, complains of severe pain "all over".  Per staff report, this was an issue overnight as well.  Mother concerned about poor pain control and perceived delays in care.   Disposition Plan & Communication   Status is: Inpatient  Remains inpatient apporopriate due to severity of illness as above - on TPN, IV antibiotics and antifungals.  Dispo: The patient is from: Home              Anticipated d/c is to: TBD expect SNF              Anticipated d/c date is: >  3 days              Patient currently is not medically stable for d/c.    Family Communication: Mother in hallway, expressed concerns of pain control and perceived delays in care.   Quite frustrated.   Consults, Procedures, Significant Events   Consultants:   Infectious Disease  Surgery - Primary    Antimicrobials:  Anti-infectives (From admission, onward)   Start     Dose/Rate Route Frequency Ordered Stop   11/20/19 1600  anidulafungin (ERAXIS) 100 mg in sodium chloride 0.9  % 100 mL IVPB        100 mg 78 mL/hr over 100 Minutes Intravenous Every 24 hours 11/20/19 1227     11/19/19 1400  anidulafungin (ERAXIS) 200 mg in sodium chloride 0.9 % 200 mL IVPB       Note to Pharmacy: Please place order for  tomorrow's dose   200 mg 78 mL/hr over 200 Minutes Intravenous  Once 11/19/19 1311 11/19/19 1714   11/19/19 1400  meropenem (MERREM) 1 g in sodium chloride 0.9 % 100 mL IVPB        1 g 200 mL/hr over 30 Minutes Intravenous Every 8 hours 11/19/19 1320     11/09/19 2200  metroNIDAZOLE (FLAGYL) IVPB 500 mg  Status:  Discontinued       "And" Linked Group Details   500 mg 100 mL/hr over 60 Minutes Intravenous Every 8 hours 11/09/19 1810 11/19/19 1311   11/09/19 2000  ciprofloxacin (CIPRO) IVPB 400 mg  Status:  Discontinued       "And" Linked Group Details   400 mg 200 mL/hr over 60 Minutes Intravenous Every 12 hours 11/09/19 1810 11/19/19 1311   11/09/19 1815  metroNIDAZOLE (FLAGYL) IVPB 500 mg        500 mg 100 mL/hr over 60 Minutes Intravenous  Once 11/09/19 1800 11/09/19 2317   11/09/19 1815  ciprofloxacin (CIPRO) IVPB 400 mg        400 mg 200 mL/hr over 60 Minutes Intravenous  Once 11/09/19 1800 11/09/19 1946         Objective   Vitals:   11/24/19 0815 11/24/19 1130 11/24/19 1651 11/24/19 1959  BP:  (!) 153/108 (!) 149/109 (!) 162/105  Pulse:  (!) 111 (!) 118 (!) 112  Resp:  20 16 (!) 21  Temp:  98.4 F (36.9 C) 99.4 F (37.4 C) 98.9 F (37.2 C)  TempSrc:  Oral Oral Oral  SpO2: 99%  99% 99%  Weight:      Height:        Intake/Output Summary (Last 24 hours) at 11/24/2019 2021 Last data filed at 11/24/2019 1819 Gross per 24 hour  Intake 4312.19 ml  Output 5145 ml  Net -832.81 ml   Filed Weights   11/09/19 1559 11/18/19 1237  Weight: (!) 163.3 kg (!) 211 kg    Physical Exam: Exam limited by patient moaning and crying out in pain.  General exam: awake, alert, distress due to pain, morbidly obese Respiratory system: symmetric chest rise,  no respiratory distress, clear but diminished due to body habitus Cardiovascular system: 1-2+ BLE edema.  RRR. Gastrointestinal system: not examined due to current condition Central nervous system: no gross focal neurologic deficits, normal speech  Labs   Data Reviewed: I have personally reviewed following labs and imaging studies  CBC: Recent Labs  Lab 11/19/19 0436 11/20/19 0426 11/21/19 1230 11/23/19 0543 11/24/19 0635  WBC 17.4* 23.5* 19.7* 15.2* 15.8*  NEUTROABS 15.3*  19.4* 16.4* 11.0*  --   HGB 8.7* 8.2* 7.6* 7.0* 7.1*  HCT 26.0* 24.0* 22.5* 22.6* 23.3*  MCV 73.9* 72.1* 83.0 78.7* 80.3  PLT 347 319 236 236 233   Basic Metabolic Panel: Recent Labs  Lab 11/20/19 0426 11/21/19 0616 11/22/19 0443 11/23/19 0543 11/24/19 0635  NA 139 141 144 145 140  K 4.1 4.0 4.8 4.4 4.7  CL 107 106 109 107 100  CO2 23 27 30  32 35*  GLUCOSE 188* 166* 213* 212* 229*  BUN 19 20 16 13 13   CREATININE 1.08* 0.87 0.71 0.58 0.50  CALCIUM 6.5* 6.9* 7.1* 7.3* 7.5*  MG 2.1 2.1 2.0 1.8 1.9  PHOS 2.6 3.1 2.8 2.0* 2.1*   GFR: Estimated Creatinine Clearance: 196 mL/min (by C-G formula based on SCr of 0.5 mg/dL). Liver Function Tests: Recent Labs  Lab 11/19/19 0436 11/23/19 0543  AST 11* 22  ALT 9 12  ALKPHOS 43 62  BILITOT 0.8 0.4  PROT 5.0* 6.5  ALBUMIN 1.7* 1.5*   No results for input(s): LIPASE, AMYLASE in the last 168 hours. No results for input(s): AMMONIA in the last 168 hours. Coagulation Profile: No results for input(s): INR, PROTIME in the last 168 hours. Cardiac Enzymes: No results for input(s): CKTOTAL, CKMB, CKMBINDEX, TROPONINI in the last 168 hours. BNP (last 3 results) No results for input(s): PROBNP in the last 8760 hours. HbA1C: No results for input(s): HGBA1C in the last 72 hours. CBG: Recent Labs  Lab 11/24/19 0017 11/24/19 0433 11/24/19 0751 11/24/19 1128 11/24/19 1600  GLUCAP 231* 213* 213* 229* 232*   Lipid Profile: Recent Labs    11/23/19 0543   TRIG 103   Thyroid Function Tests: No results for input(s): TSH, T4TOTAL, FREET4, T3FREE, THYROIDAB in the last 72 hours. Anemia Panel: No results for input(s): VITAMINB12, FOLATE, FERRITIN, TIBC, IRON, RETICCTPCT in the last 72 hours. Sepsis Labs: Recent Labs  Lab 11/21/19 1230 11/22/19 0443 11/23/19 0543  PROCALCITON 2.27 1.98 1.11    Recent Results (from the past 240 hour(s))  MRSA PCR Screening     Status: None   Collection Time: 11/18/19  6:36 PM   Specimen: Nasopharyngeal  Result Value Ref Range Status   MRSA by PCR NEGATIVE NEGATIVE Final    Comment:        The GeneXpert MRSA Assay (FDA approved for NASAL specimens only), is one component of a comprehensive MRSA colonization surveillance program. It is not intended to diagnose MRSA infection nor to guide or monitor treatment for MRSA infections. Performed at [facility], [address] [place], [place] [address]       Imaging Studies   No results found.   Medications   Scheduled Meds: . sodium chloride   Intravenous Once  . chlorhexidine  15 mL Mouth Rinse BID  . Chlorhexidine Gluconate Cloth  6 each Topical Q0600  . enoxaparin (LOVENOX) injection  40 mg Subcutaneous Q12H  . insulin aspart  0-20 Units Subcutaneous Q4H  . mouth rinse  15 mL Mouth Rinse q12n4p  . pantoprazole (PROTONIX) IV  40 mg Intravenous Q12H  . sodium chloride flush  10-40 mL Intracatheter Q12H  . sodium chloride flush  5 mL Intracatheter Q8H   Continuous Infusions: . sodium chloride Stopped (11/19/19 1354)  . sodium chloride    . anidulafungin 100 mg (11/24/19 1614)  . meropenem (MERREM) IV 1 g (11/24/19 1335)  . TPN ADULT (ION) 100 mL/hr at 11/24/19 1819  LOS: 15 days    Time spent: 30 minutes    Pennie Banter, DO [facility]  11/24/2019, 8:21 PM    If 7PM-7AM, please contact night-coverage. How to contact the [facility] Attending or Consulting provider 7A - 7P or covering provider during  after hours 7P -7A, for this patient?    1. Check the care team in [facility] and look for a) attending/consulting [facility] provider listed and b) the [facility] team listed 2. Log into [email or web address] and use [place]'s universal password to access. If you do not have the password, please contact the hospital operator. 3. Locate the [facility] provider you are looking for under [facility] and page to a number that you can be directly reached. 4. If you still have difficulty reaching the provider, please page the [facility] (Director on Call) for the Hospitalists listed on amion for assistance.

## 2019-11-24 NOTE — Progress Notes (Signed)
ICU charge nurse called to come assess patient. Continues to call out d/t abd. Pain; O2 @3L  [place]; lasix and duoneb given; unable to rest/sleep; , RN 6:20 AM 11/24/2019

## 2019-11-24 NOTE — Progress Notes (Signed)
Ch met Pt's mother Amanda Reyes while on the floor getting a notarization done. Amanda Reyes was unhappy as per Pt's communication about care. Ch returned to room to check in on Amanda Reyes. Pt was in pain, and Amanda Reyes said that doctors were going to prescribe a stronger does of pain medication. Ch prayed with Pt and mother. They were grateful for visit. Chaplains regularly following up. Ch updated staff about visit aswell.

## 2019-11-24 NOTE — Treatment Plan (Signed)
Pt had multiple BM this shift. Per MD, d/c Ng tube and insert flexiseal. Pt had another BM prior to insertion of rectal tube. Pt cleansed up and Symone RN inserted tube. Pts mother at bedside stating that staff is not caring for her daughter. Charge nurse Anabella notified as this Clinical research associate educated mother about the care patient has had since coming to the floor last evening and how the staff has been in cleaning and assessing per order her daughter. Mother has been at bedside every time this writer has cleaned her daughter up and even asked the mother if she wanted to help since she stated we were not washing her the way she wanted to be. Mother threw her hands in the room and stated no thanks. Dilaudid 1mg  given per patient request. Bed sheets and pads changed at this time.

## 2019-11-24 NOTE — Progress Notes (Signed)
Mobility Specialist - Progress Note   11/24/19 1700  Mobility  Activity Pillow/wedge support (Repositioned in bed )  Level of Assistance Total care  Assistive Device  (Bed railing)  Distance Ambulated (ft) 0 ft  Mobility Response Tolerated well  Mobility performed by Mobility specialist  $Mobility charge 1 Mobility     Post-mobility: 118 HR, 149/109 BP, 99% SpO2   Pt was lying in bed with mother present in room upon arrival. Pt c/o pain in lower abdomen "8/10". Pt declined mobility, but per mother's request, pt agreed to repositioning in bed. Nurse tech entered and along with pt's mother, assisted in bed mobility +3-4 assist. Overall, pt tolerated well.    Amanda Reyes Mobility Specialist 11/24/19, 5:20 PM

## 2019-11-24 NOTE — Progress Notes (Signed)
   11/24/19 1010  Clinical Encounter Type  Visited With Patient and family together  Visit Type Follow-up  Referral From Chaplain  Consult/Referral To Chaplain  The first time chaplain encounter pt's mother today in the hallway, she was unhappy, but by the time chaplain saw her again in pt's room her mood and attitude had changed. The second time chaplain saw mother she was with pt and PT was working with her. Chaplain briefly spoke to pt and mother and told her she would check on them later.

## 2019-11-24 NOTE — Progress Notes (Signed)
   11/24/19 1959  Assess: MEWS Score  Temp 98.9 F (37.2 C)  BP (!) 162/105 (pt in pain, prn dialudid and labetalol given)  Pulse Rate (!) 112  Resp (!) 21  Level of Consciousness Alert  SpO2 99 %  O2 Device Nasal Cannula  O2 Flow Rate (L/min) 3 L/min  Assess: MEWS Score  MEWS Temp 0  MEWS Systolic 0  MEWS Pulse 2  MEWS RR 1  MEWS LOC 0  MEWS Score 3  MEWS Score Color Yellow  Assess: if the MEWS score is Yellow or Red  Were vital signs taken at a resting state? Yes  Focused Assessment No change from prior assessment  Early Detection of Sepsis Score *See Row Information* Low  MEWS guidelines implemented *See Row Information* No, previously yellow, continue vital signs every 4 hours  Treat  Pain Scale 0-10  Pain Score 8  Pain Type Surgical pain  Pain Location Abdomen  Pain Orientation Right;Mid  Pain Descriptors / Indicators Aching  Pain Frequency Constant  Pain Onset On-going  Patients Stated Pain Goal 2  Pain Intervention(s) Medication (See eMAR)   Previously Yellow. Will Continue Q4 VS and close monitoring overnight

## 2019-11-24 NOTE — Progress Notes (Signed)
Inpatient Diabetes Program Recommendations  AACE/ADA: New Consensus Statement on Inpatient Glycemic Control  Target Ranges:  Prepandial:   less than 140 mg/dL      Peak postprandial:   less than 180 mg/dL (1-2 hours)      Critically ill patients:  140 - 180 mg/dL   Results for LYNDEL, DANCEL (MRN [id number]) as of 11/24/2019 12:21  Ref. Range 11/23/2019 07:35 11/23/2019 11:34 11/23/2019 16:34 11/23/2019 20:15 11/24/2019 00:17 11/24/2019 04:33 11/24/2019 07:51  Glucose-Capillary Latest Ref Range: 70 - 99 mg/dL 572 (H) 620 (H) 355 (H) 271 (H) 231 (H) 213 (H) 213 (H)   Review of Glycemic Control  Current orders for Inpatient glycemic control: Novolog 0-15 units Q4H; TPN@ 100 ml/hr with 15 units of insulin  Inpatient Diabetes Program Recommendations:    Insulin: Would recommend one time Lantus 20 units x1 now and increase Novolog to 0-20 units Q4H. When daily TPN ordered for 11/25/19, would recommend increasing insulin TPN to at least 50 units.   Thanks, Orlando Penner, RN, MSN, CDE Diabetes Coordinator Inpatient Diabetes Program [phone] (Team Pager from 8am to 5pm)

## 2019-11-24 NOTE — Consult Note (Signed)
PHARMACY - TOTAL PARENTERAL NUTRITION CONSULT NOTE   Indication: Prolonged ileus  Patient Measurements: Height: _0  (175.3 cm) Weight: (!) 211 kg (465 lb 2.7 oz) IBW/kg (Calculated) : 66.2 TPN AdjBW (KG): 90.5 Body mass index is 68.69 kg/m.  Assessment: 34 year old female with PMHx of HTN admitted with concern for perforated diverticulitis with contained abscesses. Patient is POD # 6 from laparoscopic lysis of adhesions and drainage of intra-abdominal abscess. There was no evidence of perforation. Procedure complicated by hypoxic respiratory failure requiring intubation. Patient extubated 9/10. No longer requiring blood pressure support.   Glucose / Insulin: A1C 5.7, BG range 204 - 271: 31u SSI required.   Electrolytes: phosphorous remains low, Cl trending down, CO2 trending up Renal: SCr < 1, stable  (good UOP) LFTs / TGs: LFTs WNL at baseline, TG 69 at baseline, 103 on 9/13  Prealbumin / albumin: <5 / 1.5 Intake / Output; MIVF: UOP of 7.7 L on 9/12. Continue to monitor I&Os and needfor intermittent Lasix as tolerated to maintain euvolemia.  GI Imaging: 9/7 CT abdomen pelvis: colonic perforation related to diverticulitis with worsening fluid and gas containing abscesses is throughout the low abdomen and pelvis many with interloop and mesenteric involvement, also with multiple areas of pelvic fluid as well showing generalized inflammation tracking to the mid abdomen. Interval decompression of the abscess in the cul-de-sac following drain placement. Extensive body wall edema.   Surgeries / Procedures:  9/8 Robotic assisted laparoscopic lysis of adhesions and drainage of intra-abdominal abscess WITHOUT evidence of perforation  Central access: 11/19/19 TPN start date: 11/19/19  Nutritional Goals (per RD recommendation on 9/7 and 9/9 --calorie goal slightly decreased but overall the same): kCal: 2600 - 2800/day, Protein: 130 - 140g/day, Fluid: > 2000 mL Goal TPN rate is 100 mL/hr  (provides 140 g of protein and 2800 kcals per day)  Current Nutrition:  NPO   Plan:   Continue TPN @ 100 mL/hr at 1800 (full rate)  Nutritional components: 15.6 % dextrose, 53 g/L amino acids, 31 g/L lipids  Electrolytes in TPN: 17mq/L of Na, 550m/L of K, 48m10mL of Ca, 48mE28m of Mg, phosphorous 19 mmol/L, adjust Cl:Ac ratio to 2:1  Add standard MVI and trace elements to TPN  Adjust SSI to Resistant  q4h SSI and adjust as needed - continue 15 units insulin regular to TPN + 20 units Lantus subQ x 1  Fluid goal met with TPN at full rate  Monitor TPN labs daily until stable then on Mon/Thurs  RodnDallie Piles4/2021 6:57 AM

## 2019-11-25 ENCOUNTER — Encounter: Payer: Self-pay | Admitting: Surgery

## 2019-11-25 ENCOUNTER — Inpatient Hospital Stay: Payer: BC Managed Care – PPO

## 2019-11-25 DIAGNOSIS — K5792 Diverticulitis of intestine, part unspecified, without perforation or abscess without bleeding: Secondary | ICD-10-CM | POA: Diagnosis not present

## 2019-11-25 DIAGNOSIS — D72829 Elevated white blood cell count, unspecified: Secondary | ICD-10-CM | POA: Diagnosis not present

## 2019-11-25 DIAGNOSIS — K651 Peritoneal abscess: Secondary | ICD-10-CM | POA: Diagnosis not present

## 2019-11-25 LAB — RENAL FUNCTION PANEL
Albumin: 1.7 g/dL — ABNORMAL LOW (ref 3.5–5.0)
Anion gap: 6 (ref 5–15)
BUN: 16 mg/dL (ref 6–20)
CO2: 35 mmol/L — ABNORMAL HIGH (ref 22–32)
Calcium: 7.5 mg/dL — ABNORMAL LOW (ref 8.9–10.3)
Chloride: 98 mmol/L (ref 98–111)
Creatinine, Ser: 0.6 mg/dL (ref 0.44–1.00)
GFR calc Af Amer: >60 mL/min (ref >60)
GFR calc non Af Amer: >60 mL/min (ref >60)
Glucose, Bld: 163 mg/dL — ABNORMAL HIGH (ref 70–99)
Phosphorus: 3 mg/dL (ref 2.5–4.6)
Potassium: 4.9 mmol/L (ref 3.5–5.1)
Sodium: 139 mmol/L (ref 135–145)

## 2019-11-25 LAB — CBC
HCT: 24.4 % — ABNORMAL LOW (ref 36.0–46.0)
Hemoglobin: 7.5 g/dL — ABNORMAL LOW (ref 12.0–15.0)
MCH: 24 pg — ABNORMAL LOW (ref 26.0–34.0)
MCHC: 30.7 g/dL (ref 30.0–36.0)
MCV: 78.2 fL — ABNORMAL LOW (ref 80.0–100.0)
Platelets: 299 10*3/uL (ref 150–400)
RBC: 3.12 MIL/uL — ABNORMAL LOW (ref 3.87–5.11)
RDW: 19.4 % — ABNORMAL HIGH (ref 11.5–15.5)
WBC: 17 10*3/uL — ABNORMAL HIGH (ref 4.0–10.5)
nRBC: 0.7 % — ABNORMAL HIGH (ref 0.0–0.2)

## 2019-11-25 LAB — GLUCOSE, CAPILLARY
Glucose-Capillary: 165 mg/dL — ABNORMAL HIGH (ref 70–99)
Glucose-Capillary: 172 mg/dL — ABNORMAL HIGH (ref 70–99)
Glucose-Capillary: 173 mg/dL — ABNORMAL HIGH (ref 70–99)
Glucose-Capillary: 176 mg/dL — ABNORMAL HIGH (ref 70–99)
Glucose-Capillary: 181 mg/dL — ABNORMAL HIGH (ref 70–99)
Glucose-Capillary: 200 mg/dL — ABNORMAL HIGH (ref 70–99)

## 2019-11-25 LAB — MAGNESIUM: Magnesium: 1.8 mg/dL (ref 1.7–2.4)

## 2019-11-25 MED ORDER — SODIUM CHLORIDE 0.9 % IV SOLN
INTRAVENOUS | Status: DC | PRN
  Administered 2019-11-25 – 2019-11-27 (×3): 250 mL via INTRAVENOUS
  Administered 2019-12-05: 400 mL via INTRAVENOUS
  Administered 2019-12-07 – 2019-12-08 (×2): 500 mL via INTRAVENOUS
  Administered 2019-12-17: 250 mL via INTRAVENOUS

## 2019-11-25 MED ORDER — TRAVASOL 10 % IV SOLN
INTRAVENOUS | Status: AC
  Filled 2019-11-25: qty 1399.2

## 2019-11-25 NOTE — Consult Note (Signed)
PHARMACY - TOTAL PARENTERAL NUTRITION CONSULT NOTE   Indication: Prolonged ileus  Patient Measurements: Height: '5\' 9"'  (175.3 cm) Weight: (!) 211 kg (465 lb 2.7 oz) IBW/kg (Calculated) : 66.2 TPN AdjBW (KG): 90.5 Body mass index is 68.69 kg/m.  Assessment: 34 year old female with PMHx of HTN admitted with concern for perforated diverticulitis with contained abscesses. Patient is POD # 7 from laparoscopic lysis of adhesions and drainage of intra-abdominal abscess. There was no evidence of perforation. Procedure complicated by hypoxic respiratory failure requiring intubation. Patient extubated 9/10. No longer requiring blood pressure support. Since the previous note she has been re-assessed for calories and nutrition goals.  Glucose / Insulin: A1C 5.7, BG range 163 - 232: 29u SSI required.   Electrolytes: potassium trending up, Cl trending down, CO2 trending up Renal: SCr < 1, stable  (good UOP) LFTs / TGs: LFTs WNL at baseline, TG 69 at baseline, 103 on 9/13  Prealbumin / albumin: <5 / 1.5 Intake / Output; MIVF: UOP of 7.7 L on 9/12. Continue to monitor I&Os and needfor intermittent Lasix as tolerated to maintain euvolemia.  GI Imaging: 9/7 CT abdomen pelvis: colonic perforation related to diverticulitis with worsening fluid and gas containing abscesses is throughout the low abdomen and pelvis many with interloop and mesenteric involvement, also with multiple areas of pelvic fluid as well showing generalized inflammation tracking to the mid abdomen. Interval decompression of the abscess in the cul-de-sac following drain placement. Extensive body wall edema.   Surgeries / Procedures:  9/8 Robotic assisted laparoscopic lysis of adhesions and drainage of intra-abdominal abscess WITHOUT evidence of perforation  Central access: 11/19/19 TPN start date: 11/19/19  Nutritional Goals (per RD recommendation on 9/7 and 9/9 --calorie goal slightly decreased but overall the same): kCal: 2700 - 3000  kCal/day, Protein: >135 g/day, Fluid: > 2000 mL Goal TPN rate is 110 mL/hr (provides 140 g of protein and 2984 kcals per day)  Current Nutrition:  NPO   Plan:   increase TPN rate to 110 mL/hr at 1800 (full rate)  Nutritional components: 17% dextrose, 53 g/L amino acids, 34 g/L lipids, 2984 kCal  Electrolytes in TPN: 22mq/L of Na, 2100m/L of K, 8m37mL of Ca, 8mE67m of Mg, phosphorous 15 mmol/L, adjust Cl:Ac ratio to 1.75:1  Add standard MVI and trace elements to TPN  Continue resistant SSI  q4h SSI and adjust as needed - increase insulin to 50 units insulin regular in TPN  Fluid goal met with TPN at full rate ([id number] TPN labs daily until stable then on Mon/Thurs  RodneDallie Piles/2021 9:53 AM

## 2019-11-25 NOTE — Progress Notes (Signed)
Progress Note    Amanda KurtzShamika L Reyes  WUJ:[id number] DOB: 06/23/1985  DOA: 11/09/2019 PCP: Patient, No Pcp Per      Brief Narrative:    Medical records reviewed and are as summarized below:  Amanda Reyes is a 34 y.o. female       Assessment/Plan:   Active Problems:   Diverticulitis of colon with perforation   Diverticulitis of large intestine with perforation and abscess   Nutrition Problem: Inadequate oral intake Etiology: inability to eat  Signs/Symptoms: NPO status   Body mass index is 68.69 kg/m.  (Morbid obesity)  Acute hypoxic and hypercapnic respiratory failure Obesity hypoventilation syndrome Probable severe OSA Type II DM with hyperglycemia Hypoalbuminemia Leukocytosis AKI-resolved   PLAN  She is on antimicrobial therapy (meropenem and anidulafungin) She is on TPN for nutrition She is on 3 L/min oxygen via nasal cannula.  Taper off as able. She is on IV Protonix She is on IV Lasix Continue CPAP at night and as needed during the day   Diet Order            Diet NPO time specified  Diet effective now                    Medications:   . sodium chloride   Intravenous Once  . chlorhexidine  15 mL Mouth Rinse BID  . Chlorhexidine Gluconate Cloth  6 each Topical Q0600  . enoxaparin (LOVENOX) injection  40 mg Subcutaneous Q12H  . furosemide  40 mg Intravenous BID  . insulin aspart  0-20 Units Subcutaneous Q4H  . mouth rinse  15 mL Mouth Rinse q12n4p  . pantoprazole (PROTONIX) IV  40 mg Intravenous Q12H  . sodium chloride flush  10-40 mL Intracatheter Q12H  . sodium chloride flush  5 mL Intracatheter Q8H   Continuous Infusions: . sodium chloride Stopped (11/19/19 1354)  . sodium chloride    . anidulafungin 100 mg (11/24/19 1614)  . meropenem (MERREM) IV 1 g (11/25/19 0455)  . TPN ADULT (ION) 100 mL/hr at 11/24/19 1819  . TPN ADULT (ION)       Anti-infectives (From admission, onward)   Start     Dose/Rate Route Frequency  Ordered Stop   11/20/19 1600  anidulafungin (ERAXIS) 100 mg in sodium chloride 0.9 % 100 mL IVPB        100 mg 78 mL/hr over 100 Minutes Intravenous Every 24 hours 11/20/19 1227     11/19/19 1400  anidulafungin (ERAXIS) 200 mg in sodium chloride 0.9 % 200 mL IVPB       Note to Pharmacy: Please place order for  tomorrow's dose   200 mg 78 mL/hr over 200 Minutes Intravenous  Once 11/19/19 1311 11/19/19 1714   11/19/19 1400  meropenem (MERREM) 1 g in sodium chloride 0.9 % 100 mL IVPB        1 g 200 mL/hr over 30 Minutes Intravenous Every 8 hours 11/19/19 1320     11/09/19 2200  metroNIDAZOLE (FLAGYL) IVPB 500 mg  Status:  Discontinued       "And" Linked Group Details   500 mg 100 mL/hr over 60 Minutes Intravenous Every 8 hours 11/09/19 1810 11/19/19 1311   11/09/19 2000  ciprofloxacin (CIPRO) IVPB 400 mg  Status:  Discontinued       "And" Linked Group Details   400 mg 200 mL/hr over 60 Minutes Intravenous Every 12 hours 11/09/19 1810 11/19/19 1311   11/09/19 1815  metroNIDAZOLE (  FLAGYL) IVPB 500 mg        500 mg 100 mL/hr over 60 Minutes Intravenous  Once 11/09/19 1800 11/09/19 2317   11/09/19 1815  ciprofloxacin (CIPRO) IVPB 400 mg        400 mg 200 mL/hr over 60 Minutes Intravenous  Once 11/09/19 1800 11/09/19 1946             Family Communication/Anticipated D/C date and plan/Code Status   DVT prophylaxis: SCD's Start: 11/23/19 1508 enoxaparin (LOVENOX) injection 40 mg Start: 11/19/19 2200 Place and maintain sequential compression device Start: 11/17/19 1017     Code Status: Full Code  Family Communication: Plan discussed with her mother at the bedside Disposition Plan: Per surgeon           Subjective:   Interval events noted.  Patient is unable to provide an adequate history because she is sleepy  Objective:    Vitals:   11/25/19 0320 11/25/19 0445 11/25/19 0900 11/25/19 1210  BP: (!) 139/93   (!) 147/95  Pulse: (!) 114   (!) 119  Resp: (!) 22   20   Temp: 98.5 F (36.9 C)   99.5 F (37.5 C)  TempSrc: Axillary   Oral  SpO2: 100% 98% 97% 97%  Weight:      Height:       No data found.   Intake/Output Summary (Last 24 hours) at 11/25/2019 1612 Last data filed at 11/25/2019 0500 Gross per 24 hour  Intake 1660.81 ml  Output 2755 ml  Net -1094.19 ml   Filed Weights   11/09/19 1559 11/18/19 1237  Weight: (!) 163.3 kg (!) 211 kg    Exam:  GEN: NAD SKIN: Warm and dry EYES: Pale but anicteric ENT: MMM CV: RRR PULM: CTA B ABD: soft, obese, NT, +BS, + JP drain in right lower quadrant CNS: Drowsy but arousable. EXT: Bilateral leg edema, no tenderness GU: Foley catheter draining amber urine   Data Reviewed:   I have personally reviewed following labs and imaging studies:  Labs: Labs show the following:   Basic Metabolic Panel: Recent Labs  Lab 11/21/19 0616 11/21/19 0616 11/22/19 0443 11/22/19 0443 11/23/19 0543 11/23/19 0543 11/24/19 0635 11/25/19 0501  NA 141  --  144  --  145  --  140 139  K 4.0   < > 4.8   < > 4.4   < > 4.7 4.9  CL 106  --  109  --  107  --  100 98  CO2 27  --  30  --  32  --  35* 35*  GLUCOSE 166*  --  213*  --  212*  --  229* 163*  BUN 20  --  16  --  13  --  13 16  CREATININE 0.87  --  0.71  --  0.58  --  0.50 0.60  CALCIUM 6.9*  --  7.1*  --  7.3*  --  7.5* 7.5*  MG 2.1  --  2.0  --  1.8  --  1.9 1.8  PHOS 3.1  --  2.8  --  2.0*  --  2.1* 3.0   < > = values in this interval not displayed.   GFR Estimated Creatinine Clearance: 196 mL/min (by C-G formula based on SCr of 0.6 mg/dL). Liver Function Tests: Recent Labs  Lab 11/19/19 0436 11/23/19 0543 11/25/19 0501  AST 11* 22  --   ALT 9 12  --   ALKPHOS 43  62  --   BILITOT 0.8 0.4  --   PROT 5.0* 6.5  --   ALBUMIN 1.7* 1.5* 1.7*   No results for input(s): LIPASE, AMYLASE in the last 168 hours. No results for input(s): AMMONIA in the last 168 hours. Coagulation profile No results for input(s): INR, PROTIME in the last 168  hours.  CBC: Recent Labs  Lab 11/19/19 0436 11/19/19 0436 11/20/19 0426 11/21/19 1230 11/23/19 0543 11/24/19 0635 11/25/19 0501  WBC 17.4*   < > 23.5* 19.7* 15.2* 15.8* 17.0*  NEUTROABS 15.3*  --  19.4* 16.4* 11.0*  --   --   HGB 8.7*   < > 8.2* 7.6* 7.0* 7.1* 7.5*  HCT 26.0*   < > 24.0* 22.5* 22.6* 23.3* 24.4*  MCV 73.9*   < > 72.1* 83.0 78.7* 80.3 78.2*  PLT 347   < > 319 236 236 233 299   < > = values in this interval not displayed.   Cardiac Enzymes: No results for input(s): CKTOTAL, CKMB, CKMBINDEX, TROPONINI in the last 168 hours. BNP (last 3 results) No results for input(s): PROBNP in the last 8760 hours. CBG: Recent Labs  Lab 11/24/19 2301 11/25/19 0322 11/25/19 0751 11/25/19 1210 11/25/19 1559  GLUCAP 192* 181* 165* 172* 176*   D-Dimer: No results for input(s): DDIMER in the last 72 hours. Hgb A1c: No results for input(s): HGBA1C in the last 72 hours. Lipid Profile: Recent Labs    11/23/19 0543  TRIG 103   Thyroid function studies: No results for input(s): TSH, T4TOTAL, T3FREE, THYROIDAB in the last 72 hours.  Invalid input(s): FREET3 Anemia work up: No results for input(s): VITAMINB12, FOLATE, FERRITIN, TIBC, IRON, RETICCTPCT in the last 72 hours. Sepsis Labs: Recent Labs  Lab 11/21/19 1230 11/22/19 0443 11/23/19 0543 11/24/19 0635 11/25/19 0501  PROCALCITON 2.27 1.98 1.11  --   --   WBC 19.7*  --  15.2* 15.8* 17.0*    Microbiology Recent Results (from the past 240 hour(s))  MRSA PCR Screening     Status: None   Collection Time: 11/18/19  6:36 PM   Specimen: Nasopharyngeal  Result Value Ref Range Status   MRSA by PCR NEGATIVE NEGATIVE Final    Comment:        The GeneXpert MRSA Assay (FDA approved for NASAL specimens only), is one component of a comprehensive MRSA colonization surveillance program. It is not intended to diagnose MRSA infection nor to guide or monitor treatment for MRSA infections. Performed at [facility], [address] [place], [place] [address]     Procedures and diagnostic studies:  CT ABDOMEN PELVIS W CONTRAST  Result Date: 11/25/2019 CLINICAL DATA:  Leukocytosis.  Abscess drainage. EXAM: CT ABDOMEN AND PELVIS WITH CONTRAST TECHNIQUE: Multidetector CT imaging of the abdomen and pelvis was performed using the standard protocol following bolus administration of intravenous contrast. CONTRAST:  OMNIPAQUE IOHEXOL 300 MG/ML  SOLN COMPARISON:  11/17/2019.  11/12/2019.  11/09/2019. FINDINGS: Lower chest: Moderate right effusion with dependent pulmonary atelectasis. Small pleural effusion on the left. Hepatobiliary: Previous cholecystectomy. No liver parenchymal lesion. Subcapsular fluid along the lateral margin of the liver with a prominent collection at the caudal tip where it measures approximately 13 x 4.7 x 11.7 cm consistent with abscess. Pancreas: Negative Spleen: Negative Adrenals/Urinary Tract: Adrenal glands are normal. No significant kidney finding. Bladder catheter in place. Stomach/Bowel: Cul-de-sac drainage catheter in place with resolution of abscess in that location. Additional drainage catheter in the lower anterior  pelvis with less abscess fluid in that region. Organizing abscess with multiple dots of air in the anterior central abdominal mesentery. Probable interloop abscess in the central to right abdomen measuring approximately 7.8 x 6.5 x 4.4 cm. Ileus pattern with dilated fluid and air-filled loops of small intestine. Vascular/Lymphatic: Negative Reproductive: No reproductive pathology suspected. Other: Body wall edema. Musculoskeletal: No acute finding. IMPRESSION: 1. Cul-de-sac drainage catheter in place with resolution of abscess in that location. Additional drainage catheter in the lower anterior pelvis with less abscess fluid in that region. 2. Organizing abscess in the anterior central abdominal mesentery with multiple small air bubbles. Probable interloop abscess in the  central to right abdomen measuring approximately 7.8 x 6.5 x 4.4 cm. 3. Subcapsular fluid along the lateral margin of the liver with a prominent collection at the caudal tip where it measures approximately 13 x 4.7 x 11.7 cm consistent with abscess. 4. Ileus pattern with dilated fluid and air-filled loops of small intestine. 5. Moderate right effusion with dependent pulmonary atelectasis. Small pleural effusion on the left. Body wall edema. Electronically Signed   By: Paulina Fusi M.D.   On: 11/25/2019 14:27               LOS: 16 days   Naida Escalante  [facility]   Pager on [email or web address]. If 7PM-7AM, please contact night-coverage at [email or web address]     11/25/2019, 4:12 PM

## 2019-11-25 NOTE — Progress Notes (Signed)
Inpatient Diabetes Program Recommendations  AACE/ADA: New Consensus Statement on Inpatient Glycemic Control (2015)  Target Ranges:  Prepandial:   less than 140 mg/dL      Peak postprandial:   less than 180 mg/dL (1-2 hours)      Critically ill patients:  140 - 180 mg/dL   Lab Results  Component Value Date   GLUCAP 165 (H) 11/25/2019   HGBA1C 5.7 (H) 11/09/2019    Review of Glycemic Control  Current orders for Inpatient glycemic control: Novolog 0-15 units Q4H; TPN@ 110 ml/hr with 50 units of insulin  Inpatient Diabetes Program Recommendations:   Agree with continued plans for TPN with 50 units Regular Insulin added.  Thank you, Billy Fischer. Shelsy Seng, RN, MSN, CDE  Diabetes Coordinator Inpatient Glycemic Control Team Team Pager [phone] (8am-5pm) 11/25/2019 10:58 AM

## 2019-11-25 NOTE — Progress Notes (Signed)
ID 34 y.o.femalewith a history ofhypertension and morbid obesity, cholecystectomy for choledocholithiasis in 2014 presented to the ED on 11/09/2019 with persistent generalized abdominal pain that began 2 to 3 weeks ago but was getting worse in the past 2 days. She also was complaining of not able to be urinate. She did not have any headache or fever chest pain cough shortness of breath vomiting diarrhea on presentation. In the ED her vitals were temperature of 98.6, blood pressure 119/98, heart rate of 130 and respiratory rate of 24. Her weight was 360 pounds. Labs revealed a WBC of 13.5, platelet of 489, creatinine of 1.75, glucose of 180 and lactate was 3.1. Chest x-ray was negative CT abdomen was concerning for acute perforated diverticulitis. General surgery had seen her on 11/10/2019 and recommended conservative management initially. She was also started on IV ciprofloxacin and Flagyl. She underwent CT-guided percutaneous drainage placement in the abscess in the cul-de-sac on 11/13/2019. The culture was E. coli and group C strep. As the patient was continuing to have fever and leukocytosis a repeat CT scan was done on 11/17/2019 Which showed numerous interloop abscess in the lower abdomen with the largest measuring 8 into 6.5 cm with gas and fluid in the area. There was signs of colonic perforation related to the diverticulitis with worsening fluid and gas containing abscess throughout the lower abdomen and pelvis.. Patient was taken for surgery on 11/18/2019 and underwent robotic lysis of adhesions with irrigation and debridement of multiple intra-abdominal peritoneal and pelvic abscesses with drain placement.Saw Her on 11/19/19 and started meropenem ( because of PCn allergy and not responding on cipro and flagyl and need for enterococcus coverage)  and anidulafungin  Pt ill C/o pain abdomen letahrgic . Patient Vitals for the past 24 hrs:  BP Temp Temp src Pulse Resp SpO2  11/25/19 1210 (!)  147/95 99.5 F (37.5 C) Oral (!) 119 20 97 %  11/25/19 0900 -- -- -- -- -- 97 %  11/25/19 0445 -- -- -- -- -- 98 %  11/25/19 0320 (!) 139/93 98.5 F (36.9 C) Axillary (!) 114 (!) 22 100 %  11/24/19 2324 (!) 130/97 98.7 F (37.1 C) Oral (!) 105 (!) 24 98 %  11/24/19 1959 (!) 162/105 98.9 F (37.2 C) Oral (!) 112 (!) 21 99 %  11/24/19 1651 (!) 149/109 99.4 F (37.4 C) Oral (!) 118 16 99 %  o/e letahrgic Chest decreased air entry bases Morbid obesity Tender abdomen Rt gluteal drain Lower quadrant drain Anasarca  labs  CBC Latest Ref Rng & Units 11/25/2019 11/24/2019 11/23/2019  WBC 4.0 - 10.5 K/uL 17.0(H) 15.8(H) 15.2(H)  Hemoglobin 12.0 - 15.0 g/dL 7.5(L) 7.1(L) 7.0(L)  Hematocrit 36 - 46 % 24.4(L) 23.3(L) 22.6(L)  Platelets 150 - 400 K/uL 299 233 236    CMP Latest Ref Rng & Units 11/25/2019 11/24/2019 11/23/2019  Glucose 70 - 99 mg/dL 154(M) 086(P) 619(J)  BUN 6 - 20 mg/dL 16 13 13   Creatinine 0.44 - 1.00 mg/dL 0.93 2.67  Sodium 135 - 145 mmol/L 139 140 145  Potassium 3.5 - 5.1 mmol/L 4.9 4.7 4.4  Chloride 98 - 111 mmol/L 98 100 107  CO2 22 - 32 mmol/L 35(H) 35(H) 32  Calcium 8.9 - 10.3 mg/dL 7.5(L) 7.5(L) 7.3(L)  Total Protein 6.5 - 8.1 g/dL - - 6.5  Total Bilirubin 0.3 - 1.2 mg/dL - - 0.4  Alkaline Phos 38 - 126 U/L - - 62  AST 15 - 41 U/L - - 22  ALT 0 - 44 U/L - - 12  Imaging    Organizing abscess in the anterior central abdominal mesentery with multiple small air bubbles. Probable interloop abscess in the central to right abdomen measuring approximately 7.8 x 6.5 x 4.4 cm.3. Subcapsular fluid along the lateral margin of the liver with a prominent collection at the caudal tip where it measures approximately 13 x 4.7 x 11.7 cm consistent with abscess.4. Ileus pattern with dilated fluid and air-filled loops of small intestine. 5. Moderate right effusion with dependent pulmonary atelectasis.Small pleural effusion on the left. Body wall  edema.   Impression/recommendation Perforated diverticulitis with extensive intra-abdominal loop abscesses Status post laparoscopy and washout.on 11/18/19  Because of extensive abscessesshe is currently on meropenem anidulafungin. The leukocytosis is increasing. CT abdomen done today shows an abscess along lateral margin and organizing abscesses central abdominal mesentry - she has poor source control and no amount of antibiotics will clear this . IR is going to place another drain but the diverticular perforation will have to be surgically treated..   Morbid obesity  Discussed with IR- will discuss with surgical team

## 2019-11-25 NOTE — Progress Notes (Addendum)
[facility] SURGICAL PROGRESS NOTE  Hospital Day(s): 16.   Post op day(s): 7 Days Post-Op.   Interval History:  Patient seen and examined She refused to go to CT yesterday, no acute events or new complaints overnight.  Patient reports she feels a little better this morning but still having some LLQ discomfort Slight bump in leukocytosis to 17K this morning; she continues to remain without fevers Hgb stable low at 7.5, which is improved some from prior days Renal function remains normal with sCr - 0.60, UO - 3.8L Hypocalcemia to 7.5 but this is 9.3 when corrected for albumin of 1.7 NGT removed yesterday, rectal tube placed Surgical drain with 315 ccs out in last 24 hours; seropurulent Worked with PT; recommending SNF  Vital signs in last 24 hours: [min-max] current  Temp:  [98.4 F (36.9 C)-99.4 F (37.4 C)] 98.5 F (36.9 C) (09/15 0320) Pulse Rate:  [105-118] 114 (09/15 0320) Resp:  [16-29] 22 (09/15 0320) BP: (130-162)/(93-109) 139/93 (09/15 0320) SpO2:  [98 %-100 %] 98 % (09/15 0445) FiO2 (%):  [40 %] 40 % (09/15 0320)     Height: 5\' 9"  (175.3 cm) Weight: (!) 211 kg BMI (Calculated): 68.66   Intake/Output last 2 shifts:  09/14 0701 - 09/15 0700 In: 3614.4 [I.V.:1754.4; NG/GT:1090; IV Piggyback:200] Out: 4325 [Urine:3850; Emesis/NG output:50; Drains:315; Stool:110]   Physical Exam:  Constitutional: alert, cooperative and no distress Respiratory: tachypneic, breathing appears labored however this is relatively unchanged from previous, on [place], refusing BiPAP Cardiovascular:Tachycardicand sinus rhythm  Gastrointestinal:Obese, soft, no appreciable tenderness, non-distended, JP in RLQ with seropurulent drainage Genitourinary: Foley in place Musculoskeletal: 1+ edema to bilateral LE Integumentary:Laparoscopic incisions are CDI with dermabond, no erythema or drainage  Labs:  CBC Latest Ref Rng & Units 11/25/2019 11/24/2019 11/23/2019  WBC 4.0 - 10.5 K/uL  17.0(H) 15.8(H) 15.2(H)  Hemoglobin 12.0 - 15.0 g/dL 7.5(L) 7.1(L) 7.0(L)  Hematocrit 36 - 46 % 24.4(L) 23.3(L) 22.6(L)  Platelets 150 - 400 K/uL 299 233 236   CMP Latest Ref Rng & Units 11/25/2019 11/24/2019 11/23/2019  Glucose 70 - 99 mg/dL 11/25/2019) 250(N) 397(Q)  BUN 6 - 20 mg/dL 16 13 13   Creatinine 0.44 - 1.00 mg/dL 734(L 9.37  Sodium 135 - 145 mmol/L 139 140 145  Potassium 3.5 - 5.1 mmol/L 4.9 4.7 4.4  Chloride 98 - 111 mmol/L 98 100 107  CO2 22 - 32 mmol/L 35(H) 35(H) 32  Calcium 8.9 - 10.3 mg/dL 7.5(L) 7.5(L) 7.3(L)  Total Protein 6.5 - 8.1 g/dL - - 6.5  Total Bilirubin 0.3 - 1.2 mg/dL - - 0.4  Alkaline Phos 38 - 126 U/L - - 62  AST 15 - 41 U/L - - 22  ALT 0 - 44 U/L - - 12    Imaging studies: No new pertinent imaging studies, CT Abdomen/Pelvis from 09/14 still not completed secondary to patient declining this   Assessment/Plan:  34 y.o. female 7 Days Post-Op s/p robotic assisted laparoscopic lysis of adhesion and drainage of intra-abdominal abscess WITHOUT evidence of perforation or frank stoolforperforated diverticulitis, complicated by hypoxic respiratory failure requiring post-operative intubation.   - Need to complete CT Abdomen/Pelvis this morning    - Continue NPO for now pending CT results, ice chips for comfort okay  - Continue IV ABx; switched to Meropenem, andifungalin added; ID on board - No plans for surgical re-intervention at this time - Monitor abdominal examination; on-going bowel function - Pain control prn; antiemetics prn - Monitor leukocytosis, renal function -  Home medications -Mobilization encouraged as tolerated - DVT prophylaxis; okay to resume   All of the above findings and recommendations were discussed with the patient, patient's family (mother at bedside), and the medical team, and all of patient's and family's questions were answered to their  expressed satisfaction.  -- Lynden Oxford, PA-C [facility] 11/25/2019, 7:26 AM [phone] M-F: 7am - 4pm

## 2019-11-25 NOTE — Progress Notes (Addendum)
Pt has declined Bipap with RT. I came to the room to explain importance of having it for improvement of her  lung function and to avoid future deterioration and complications. She verbalized that she 'Understands what it's for and she just want one good night's sleep and will try it tomorrow'.    She has also declined going down to CT tonight, stating "they should have done this during the day, I need to sleep tonight, I have not slept for days". I have told her that the surgeon has requested it to assess her improvement, she replied " I will have it done in the morning".    0145 - Pt given more education and eventually agreed to wear Bipap at 0145, removed by patient at 0445 - refused to put in back on at this point. 3L Nasal cannula resumed.  Charge RN and oncall NP informed of situation.

## 2019-11-25 NOTE — Progress Notes (Signed)
Physical Therapy Treatment Patient Details Name: Amanda Reyes MRN: [id number] DOB: 05/27/1985 Today's Date: 11/25/2019    History of Present Illness Pt is 34 year old female with past medical history for morbid obesity and hypertension, admitted to ICU due to acute diverticulitis and microperforation.  Patient is s/p of robotic surgery for lysis of adhesions.    PT Comments    Patient agrees to PT treatment. She performs AAROM BLE x 15 reps. She performs SAQ, LAQ, heel slides, hip abd/add, quad sets, glut sets, and ankle pumps x 15. Patient reports no pain during treatment. She will continue to benefit from skilled PT to improve mobility and strength.   Follow Up Recommendations  SNF     Equipment Recommendations  Other (comment);3in1 (PT) (Bariatric walker.)    Recommendations for Other Services       Precautions / Restrictions Restrictions Weight Bearing Restrictions: No    Mobility  Bed Mobility                  Transfers                    Ambulation/Gait                 Stairs             Wheelchair Mobility    Modified Rankin (Stroke Patients Only)       Balance                                            Cognition Arousal/Alertness: Awake/alert Behavior During Therapy: Flat affect Overall Cognitive Status: Within Functional Limits for tasks assessed                                        Exercises Total Joint Exercises Ankle Circles/Pumps: Both;15 reps Quad Sets: Strengthening;15 reps Gluteal Sets: Strengthening;15 reps Heel Slides: Strengthening;15 reps Hip ABduction/ADduction: Strengthening;15 reps Knee Flexion: Strengthening;15 reps General Exercises - Lower Extremity Ankle Circles/Pumps: Both;15 reps Quad Sets: 15 reps Gluteal Sets: Strengthening;15 reps    General Comments        Pertinent Vitals/Pain Pain Assessment: No/denies pain    Home Living                       Prior Function            PT Goals (current goals can now be found in the care plan section) Acute Rehab PT Goals Patient Stated Goal: Go home. PT Goal Formulation: With patient/family Time For Goal Achievement: 12/08/19 Potential to Achieve Goals: Fair    Frequency    Min 2X/week      PT Plan      Co-evaluation              AM-PAC PT "6 Clicks" Mobility   Outcome Measure  Help needed turning from your back to your side while in a flat bed without using bedrails?: Total Help needed moving from lying on your back to sitting on the side of a flat bed without using bedrails?: Total Help needed moving to and from a bed to a chair (including a wheelchair)?: Total Help needed standing up from a chair using your arms (e.g., wheelchair or bedside chair)?: Total Help needed to  walk in hospital room?: Total Help needed climbing 3-5 steps with a railing? : Total 6 Click Score: 6    End of Session   Activity Tolerance: Patient tolerated treatment well;Patient limited by fatigue Patient left: in bed;with bed alarm set Nurse Communication: Mobility status PT Visit Diagnosis: Repeated falls (R29.6);Muscle weakness (generalized) (M62.81);Difficulty in walking, not elsewhere classified (R26.2)     Time: 1600-1630 PT Time Calculation (min) (ACUTE ONLY): 30 min  Charges:  $Gait Training: 23-37 mins                        [place], Sharion Settler, PT DPT 11/25/2019, 4:57 PM

## 2019-11-25 NOTE — Progress Notes (Signed)
°   11/25/19 1200  Clinical Encounter Type  Visited With Patient and family together  Visit Type Follow-up  Referral From Chaplain  Consult/Referral To Chaplain  While rounding,chaplain stopped in to check on pt. When chaplain arrived, pt's mother was at bedside. Pt seems to be a little more alert today than in the past. Chaplain's visit was brief, she will follow up later.

## 2019-11-26 ENCOUNTER — Inpatient Hospital Stay: Payer: BC Managed Care – PPO

## 2019-11-26 DIAGNOSIS — D72829 Elevated white blood cell count, unspecified: Secondary | ICD-10-CM | POA: Diagnosis not present

## 2019-11-26 DIAGNOSIS — K651 Peritoneal abscess: Secondary | ICD-10-CM | POA: Diagnosis not present

## 2019-11-26 DIAGNOSIS — K5792 Diverticulitis of intestine, part unspecified, without perforation or abscess without bleeding: Secondary | ICD-10-CM | POA: Diagnosis not present

## 2019-11-26 LAB — COMPREHENSIVE METABOLIC PANEL
ALT: 20 U/L (ref 0–44)
AST: 24 U/L (ref 15–41)
Albumin: 1.6 g/dL — ABNORMAL LOW (ref 3.5–5.0)
Alkaline Phosphatase: 83 U/L (ref 38–126)
Anion gap: 8 (ref 5–15)
BUN: 16 mg/dL (ref 6–20)
CO2: 37 mmol/L — ABNORMAL HIGH (ref 22–32)
Calcium: 7.7 mg/dL — ABNORMAL LOW (ref 8.9–10.3)
Chloride: 94 mmol/L — ABNORMAL LOW (ref 98–111)
Creatinine, Ser: 0.56 mg/dL (ref 0.44–1.00)
GFR calc Af Amer: >60 mL/min (ref >60)
GFR calc non Af Amer: >60 mL/min (ref >60)
Glucose, Bld: 180 mg/dL — ABNORMAL HIGH (ref 70–99)
Potassium: 4.5 mmol/L (ref 3.5–5.1)
Sodium: 139 mmol/L (ref 135–145)
Total Bilirubin: 0.4 mg/dL (ref 0.3–1.2)
Total Protein: 7.5 g/dL (ref 6.5–8.1)

## 2019-11-26 LAB — GLUCOSE, CAPILLARY
Glucose-Capillary: 173 mg/dL — ABNORMAL HIGH (ref 70–99)
Glucose-Capillary: 173 mg/dL — ABNORMAL HIGH (ref 70–99)
Glucose-Capillary: 182 mg/dL — ABNORMAL HIGH (ref 70–99)
Glucose-Capillary: 187 mg/dL — ABNORMAL HIGH (ref 70–99)
Glucose-Capillary: 191 mg/dL — ABNORMAL HIGH (ref 70–99)
Glucose-Capillary: 220 mg/dL — ABNORMAL HIGH (ref 70–99)

## 2019-11-26 LAB — CBC
HCT: 23.5 % — ABNORMAL LOW (ref 36.0–46.0)
Hemoglobin: 7.4 g/dL — ABNORMAL LOW (ref 12.0–15.0)
MCH: 24.4 pg — ABNORMAL LOW (ref 26.0–34.0)
MCHC: 31.5 g/dL (ref 30.0–36.0)
MCV: 77.6 fL — ABNORMAL LOW (ref 80.0–100.0)
Platelets: 310 10*3/uL (ref 150–400)
RBC: 3.03 MIL/uL — ABNORMAL LOW (ref 3.87–5.11)
RDW: 19.6 % — ABNORMAL HIGH (ref 11.5–15.5)
WBC: 17.1 10*3/uL — ABNORMAL HIGH (ref 4.0–10.5)
nRBC: 0.2 % (ref 0.0–0.2)

## 2019-11-26 LAB — MAGNESIUM: Magnesium: 2 mg/dL (ref 1.7–2.4)

## 2019-11-26 LAB — PHOSPHORUS: Phosphorus: 3.3 mg/dL (ref 2.5–4.6)

## 2019-11-26 MED ORDER — TRAVASOL 10 % IV SOLN
INTRAVENOUS | Status: AC
  Filled 2019-11-26: qty 1399.2

## 2019-11-26 NOTE — Progress Notes (Signed)
Pt's CT guided drainage placement was cancelled today. MD aware.Family at bedside.

## 2019-11-26 NOTE — Progress Notes (Addendum)
[facility] SURGICAL PROGRESS NOTE   Hospital Day(s): 17.   Post op day(s): 8 Days Post-Op.   Interval History:  Patient seen and examined no acute events or new complaints overnight.  Patient reports that her abdominal pain feels "better this morning." Her abdominal pain is primarily in LLQ and RUQ, she is unable to illicit more No fever, chills, nausea, emesis CBC has not been drawn this morning Renal function remains normal, sCr - 0.56, good UO - 4.2L responding to diuresis No significant electrolyte derangements  CT A/P on 09/15 concerning for perihepatic fluid collection and possible abscess, IR to place additional drain today She is currently NPO + TPN Worked with PT; recommending SNF   Vital signs in last 24 hours: [min-max] current  Temp:  [97.6 F (36.4 C)-99.5 F (37.5 C)] 97.6 F (36.4 C) (09/16 0351) Pulse Rate:  [118-119] 118 (09/16 0351) Resp:  [20] 20 (09/15 2023) BP: (143-147)/(94-101) 147/94 (09/16 0351) SpO2:  [97 %-100 %] 100 % (09/16 0351)     Height: 5\' 9"  (175.3 cm) Weight: (!) 211 kg BMI (Calculated): 68.66   Intake/Output last 2 shifts:  09/15 0701 - 09/16 0700 In: 2773.7 [I.V.:2328.6; IV Piggyback:445.1] Out: 4305 [Urine:4280; Drains:25]   Physical Exam:  Constitutional: alert, cooperative and no distress Respiratory: tachypneic, breathing appears labored however this is relatively unchanged from previous, on , refusing BiPAP Cardiovascular:Tachycardicand sinus rhythm  Gastrointestinal:Obese, soft, no appreciable tenderness, non-distended, JP in RLQ with seropurulent drainage Genitourinary: Foley in place Musculoskeletal: 1+ edema to bilateral LE Integumentary:Laparoscopic incisions are CDI with dermabond, no erythema or drainage   Labs:  CBC Latest Ref Rng & Units 11/25/2019 11/24/2019 11/23/2019  WBC 4.0 - 10.5 K/uL 17.0(H) 15.8(H) 15.2(H)  Hemoglobin 12.0 - 15.0 g/dL 7.5(L) 7.1(L) 7.0(L)  Hematocrit 36 - 46 % 24.4(L)  23.3(L) 22.6(L)  Platelets 150 - 400 K/uL 299 233 236   CMP Latest Ref Rng & Units 11/25/2019 11/24/2019 11/23/2019  Glucose 70 - 99 mg/dL 11/25/2019) 703(J) 009(F)  BUN 6 - 20 mg/dL 16 13 13   Creatinine 0.44 - 1.00 mg/dL 818(E 9.93  Sodium 135 - 145 mmol/L 139 140 145  Potassium 3.5 - 5.1 mmol/L 4.9 4.7 4.4  Chloride 98 - 111 mmol/L 98 100 107  CO2 22 - 32 mmol/L 35(H) 35(H) 32  Calcium 8.9 - 10.3 mg/dL 7.5(L) 7.5(L) 7.3(L)  Total Protein 6.5 - 8.1 g/dL - - 6.5  Total Bilirubin 0.3 - 1.2 mg/dL - - 0.4  Alkaline Phos 38 - 126 U/L - - 62  AST 15 - 41 U/L - - 22  ALT 0 - 44 U/L - - 12    Imaging studies:   CT Abdomen/Pelvis (11/25/2019) personally reviewed showing perihepatic fluid collection concerning for abscess, scattered pneumoperitoneum, improvement in previous pelvic abscesses, and radiologist report reviewed below:  IMPRESSION: 1. Cul-de-sac drainage catheter in place with resolution of abscess in that location. Additional drainage catheter in the lower anterior pelvis with less abscess fluid in that region. 2. Organizing abscess in the anterior central abdominal mesentery with multiple small air bubbles. Probable interloop abscess in the central to right abdomen measuring approximately 7.8 x 6.5 x 4.4 cm. 3. Subcapsular fluid along the lateral margin of the liver with a prominent collection at the caudal tip where it measures approximately 13 x 4.7 x 11.7 cm consistent with abscess. 4. Ileus pattern with dilated fluid and air-filled loops of small intestine. 5. Moderate right effusion with dependent pulmonary atelectasis. Small pleural  effusion on the left. Body wall edema.   Assessment/Plan:  34 y.o. female 8 Days Post-Op s/p robotic assisted laparoscopic lysis of adhesion and drainage of intra-abdominal abscess WITHOUT evidence of perforation or frank stoolforperforated diverticulitis, complicated by hypoxic respiratory failure requiring post-operative intubation.   -  Plan for IR placement of drain in perihepatic fluid collection this morning; greatly appreciate their assistance     - Continue NPO for now; continue TPN at goal rate    - Continue IV ABx; switched to Meropenem, andifungalin added; ID on board - No plans for surgical re-intervention at this time - Monitor abdominal examination; on-going bowel function - Pain control prn; antiemetics prn - Monitor leukocytosis, renal function - Home medications -Mobilization encouraged as tolerated - DVT prophylaxis; hold for IR procedure   All of the above findings and recommendations were discussed with the patient, and the medical team, and all of patient'squestions were answered to their expressed satisfaction  -- Lynden Oxford, PA-C [facility] 11/26/2019, 7:19 AM [phone] M-F: 7am - 4pm

## 2019-11-26 NOTE — Progress Notes (Addendum)
Progress Note    Amanda Reyes  NUU:[id number] DOB: 01/27/1986  DOA: 11/09/2019 PCP: Patient, No Pcp Per      Brief Narrative:    Medical records reviewed and are as summarized below:  Amanda Reyes is a 34 y.o. female       Assessment/Plan:   Active Problems:   Diverticulitis of colon with perforation   Diverticulitis of large intestine with perforation and abscess   Nutrition Problem: Inadequate oral intake Etiology: inability to eat  Signs/Symptoms: NPO status   Body mass index is 68.69 kg/m.  (Morbid obesity)  Acute hypoxic and hypercapnic respiratory failure Obesity hypoventilation syndrome Probable severe OSA Type II DM with hyperglycemia Hypoalbuminemia Perihepatic abscess Leukocytosis Sinus tachycardia-likely from underlying infection AKI-resolved   PLAN  CT abdomen and pelvis done yesterday has been reviewed. Plan for placement of draining perihepatic fluid collection by IR today Continue antimicrobial therapy. She remains on TPN for nutrition  continue 3 L/min oxygen via nasal cannula.  Taper off as able.   She is on IV Protonix She is on IV Lasix Continue CPAP at night and as needed during the day Follow-up with general surgeon for further recommendations Plan discussed with her nurse and student nurse at the bedside Plan discussed with her mother at the bedside    Diet Order            Diet NPO time specified Except for: Sips with Meds, Ice Chips  Diet effective now                    Medications:   . sodium chloride   Intravenous Once  . chlorhexidine  15 mL Mouth Rinse BID  . Chlorhexidine Gluconate Cloth  6 each Topical Q0600  . furosemide  40 mg Intravenous BID  . insulin aspart  0-20 Units Subcutaneous Q4H  . mouth rinse  15 mL Mouth Rinse q12n4p  . pantoprazole (PROTONIX) IV  40 mg Intravenous Q12H  . sodium chloride flush  10-40 mL Intracatheter Q12H  . sodium chloride flush  5 mL Intracatheter Q8H    Continuous Infusions: . sodium chloride Stopped (11/25/19 1645)  . sodium chloride    . sodium chloride Stopped (11/25/19 1858)  . anidulafungin Stopped (11/25/19 1830)  . meropenem (MERREM) IV 1 g (11/26/19 3474)  . TPN ADULT (ION) 110 mL/hr at 11/26/19 0329  . TPN ADULT (ION)       Anti-infectives (From admission, onward)   Start     Dose/Rate Route Frequency Ordered Stop   11/20/19 1600  anidulafungin (ERAXIS) 100 mg in sodium chloride 0.9 % 100 mL IVPB        100 mg 78 mL/hr over 100 Minutes Intravenous Every 24 hours 11/20/19 1227     11/19/19 1400  anidulafungin (ERAXIS) 200 mg in sodium chloride 0.9 % 200 mL IVPB       Note to Pharmacy: Please place order for  tomorrow's dose   200 mg 78 mL/hr over 200 Minutes Intravenous  Once 11/19/19 1311 11/19/19 1714   11/19/19 1400  meropenem (MERREM) 1 g in sodium chloride 0.9 % 100 mL IVPB        1 g 200 mL/hr over 30 Minutes Intravenous Every 8 hours 11/19/19 1320     11/09/19 2200  metroNIDAZOLE (FLAGYL) IVPB 500 mg  Status:  Discontinued       "And" Linked Group Details   500 mg 100 mL/hr over 60 Minutes Intravenous Every  8 hours 11/09/19 1810 11/19/19 1311   11/09/19 2000  ciprofloxacin (CIPRO) IVPB 400 mg  Status:  Discontinued       "And" Linked Group Details   400 mg 200 mL/hr over 60 Minutes Intravenous Every 12 hours 11/09/19 1810 11/19/19 1311   11/09/19 1815  metroNIDAZOLE (FLAGYL) IVPB 500 mg        500 mg 100 mL/hr over 60 Minutes Intravenous  Once 11/09/19 1800 11/09/19 2317   11/09/19 1815  ciprofloxacin (CIPRO) IVPB 400 mg        400 mg 200 mL/hr over 60 Minutes Intravenous  Once 11/09/19 1800 11/09/19 1946             Family Communication/Anticipated D/C date and plan/Code Status   DVT prophylaxis: SCD's Start: 11/23/19 1508 Place and maintain sequential compression device Start: 11/17/19 1017     Code Status: Full Code  Family Communication: Plan discussed with her mother at the  bedside Disposition Plan: Per surgeon           Subjective:   C/o severe abdominal pain.   Objective:    Vitals:   11/26/19 0917 11/26/19 0954 11/26/19 1055 11/26/19 1155  BP: (!) 140/95 (!) 159/100 (!) 135/105 122/81  Pulse: (!) 111 (!) 112 (!) 121 100  Resp: (!) 28 (!) 29 (!) 28 (!) 24  Temp: 98.8 F (37.1 C) 100.2 F (37.9 C) 99.3 F (37.4 C) 99 F (37.2 C)  TempSrc: Oral Oral Oral Oral  SpO2: 100% 100% 99% 100%  Weight:      Height:       No data found.   Intake/Output Summary (Last 24 hours) at 11/26/2019 1244 Last data filed at 11/26/2019 1059 Gross per 24 hour  Intake 2773.71 ml  Output 6080 ml  Net -3306.29 ml   Filed Weights   11/09/19 1559 11/18/19 1237  Weight: (!) 163.3 kg (!) 211 kg    Exam:  GEN: No acute respiratory distress SKIN: Warm and dry EYES: Pale, anicteric ENT: MMM CV: Regular rate but tachycardic PULM: No wheezing or rales heard ABD: Soft, obese, lower abdominal tenderness, no rebound tenderness or guarding,+ JP drain in right lower quadrant with purulent drainage CNS: Drowsy but arousable. EXT: Bilateral leg edema, no tenderness GU: Foley catheter draining amber urine   Data Reviewed:   I have personally reviewed following labs and imaging studies:  Labs: Labs show the following:   Basic Metabolic Panel: Recent Labs  Lab 11/22/19 0443 11/22/19 0443 11/23/19 0543 11/23/19 0543 11/24/19 1638 11/24/19 0635 11/25/19 0501 11/26/19 0642  NA 144  --  145  --  140  --  139 139  K 4.8   < > 4.4   < > 4.7   < > 4.9 4.5  CL 109  --  107  --  100  --  98 94*  CO2 30  --  32  --  35*  --  35* 37*  GLUCOSE 213*  --  212*  --  229*  --  163* 180*  BUN 16  --  13  --  13  --  16 16  CREATININE 0.71  --  0.58  --  0.50  --  0.60 0.56  CALCIUM 7.1*  --  7.3*  --  7.5*  --  7.5* 7.7*  MG 2.0  --  1.8  --  1.9  --  1.8 2.0  PHOS 2.8  --  2.0*  --  2.1*  --  3.0 3.3   < > = values in this interval not displayed.    GFR Estimated Creatinine Clearance: 196 mL/min (by C-G formula based on SCr of 0.56 mg/dL). Liver Function Tests: Recent Labs  Lab 11/23/19 0543 11/25/19 0501 11/26/19 0642  AST 22  --  24  ALT 12  --  20  ALKPHOS 62  --  83  BILITOT 0.4  --  0.4  PROT 6.5  --  7.5  ALBUMIN 1.5* 1.7* 1.6*   No results for input(s): LIPASE, AMYLASE in the last 168 hours. No results for input(s): AMMONIA in the last 168 hours. Coagulation profile No results for input(s): INR, PROTIME in the last 168 hours.  CBC: Recent Labs  Lab 11/20/19 0426 11/21/19 1230 11/23/19 0543 11/24/19 0635 11/25/19 0501  WBC 23.5* 19.7* 15.2* 15.8* 17.0*  NEUTROABS 19.4* 16.4* 11.0*  --   --   HGB 8.2* 7.6* 7.0* 7.1* 7.5*  HCT 24.0* 22.5* 22.6* 23.3* 24.4*  MCV 72.1* 83.0 78.7* 80.3 78.2*  PLT 319 236 236 233 299   Cardiac Enzymes: No results for input(s): CKTOTAL, CKMB, CKMBINDEX, TROPONINI in the last 168 hours. BNP (last 3 results) No results for input(s): PROBNP in the last 8760 hours. CBG: Recent Labs  Lab 11/25/19 2020 11/25/19 2355 11/26/19 0343 11/26/19 0825 11/26/19 1155  GLUCAP 200* 173* 173* 173* 187*   D-Dimer: No results for input(s): DDIMER in the last 72 hours. Hgb A1c: No results for input(s): HGBA1C in the last 72 hours. Lipid Profile: No results for input(s): CHOL, HDL, LDLCALC, TRIG, CHOLHDL, LDLDIRECT in the last 72 hours. Thyroid function studies: No results for input(s): TSH, T4TOTAL, T3FREE, THYROIDAB in the last 72 hours.  Invalid input(s): FREET3 Anemia work up: No results for input(s): VITAMINB12, FOLATE, FERRITIN, TIBC, IRON, RETICCTPCT in the last 72 hours. Sepsis Labs: Recent Labs  Lab 11/21/19 1230 11/22/19 0443 11/23/19 0543 11/24/19 0635 11/25/19 0501  PROCALCITON 2.27 1.98 1.11  --   --   WBC 19.7*  --  15.2* 15.8* 17.0*    Microbiology Recent Results (from the past 240 hour(s))  MRSA PCR Screening     Status: None   Collection Time: 11/18/19   6:36 PM   Specimen: Nasopharyngeal  Result Value Ref Range Status   MRSA by PCR NEGATIVE NEGATIVE Final    Comment:        The GeneXpert MRSA Assay (FDA approved for NASAL specimens only), is one component of a comprehensive MRSA colonization surveillance program. It is not intended to diagnose MRSA infection nor to guide or monitor treatment for MRSA infections. Performed at [facility], [address] [place], [place] [address]     Procedures and diagnostic studies:  CT ABDOMEN PELVIS W CONTRAST  Result Date: 11/25/2019 CLINICAL DATA:  Leukocytosis.  Abscess drainage. EXAM: CT ABDOMEN AND PELVIS WITH CONTRAST TECHNIQUE: Multidetector CT imaging of the abdomen and pelvis was performed using the standard protocol following bolus administration of intravenous contrast. CONTRAST:  125mL OMNIPAQUE IOHEXOL 300 MG/ML  SOLN COMPARISON:  11/17/2019.  11/12/2019.  11/09/2019. FINDINGS: Lower chest: Moderate right effusion with dependent pulmonary atelectasis. Small pleural effusion on the left. Hepatobiliary: Previous cholecystectomy. No liver parenchymal lesion. Subcapsular fluid along the lateral margin of the liver with a prominent collection at the caudal tip where it measures approximately 13 x 4.7 x 11.7 cm consistent with abscess. Pancreas: Negative Spleen: Negative Adrenals/Urinary Tract: Adrenal glands are normal. No significant kidney finding. Bladder catheter in place. Stomach/Bowel:  Cul-de-sac drainage catheter in place with resolution of abscess in that location. Additional drainage catheter in the lower anterior pelvis with less abscess fluid in that region. Organizing abscess with multiple dots of air in the anterior central abdominal mesentery. Probable interloop abscess in the central to right abdomen measuring approximately 7.8 x 6.5 x 4.4 cm. Ileus pattern with dilated fluid and air-filled loops of small intestine. Vascular/Lymphatic: Negative Reproductive: No  reproductive pathology suspected. Other: Body wall edema. Musculoskeletal: No acute finding. IMPRESSION: 1. Cul-de-sac drainage catheter in place with resolution of abscess in that location. Additional drainage catheter in the lower anterior pelvis with less abscess fluid in that region. 2. Organizing abscess in the anterior central abdominal mesentery with multiple small air bubbles. Probable interloop abscess in the central to right abdomen measuring approximately 7.8 x 6.5 x 4.4 cm. 3. Subcapsular fluid along the lateral margin of the liver with a prominent collection at the caudal tip where it measures approximately 13 x 4.7 x 11.7 cm consistent with abscess. 4. Ileus pattern with dilated fluid and air-filled loops of small intestine. 5. Moderate right effusion with dependent pulmonary atelectasis. Small pleural effusion on the left. Body wall edema. Electronically Signed   By: Paulina Fusi M.D.   On: 11/25/2019 14:27               LOS: 17 days   Amanda Reyes  [facility]   Pager on [email or web address]. If 7PM-7AM, please contact night-coverage at [email or web address]     11/26/2019, 12:44 PM

## 2019-11-26 NOTE — Consult Note (Signed)
PHARMACY - TOTAL PARENTERAL NUTRITION CONSULT NOTE   Indication: Prolonged ileus  Patient Measurements: Height: '5\' 9"'  (175.3 cm) Weight: (!) 211 kg (465 lb 2.7 oz) IBW/kg (Calculated) : 66.2 TPN AdjBW (KG): 90.5 Body mass index is 68.69 kg/m.  Assessment: 34 year old female with PMHx of HTN admitted with concern for perforated diverticulitis with contained abscesses. Patient is POD # 8 from laparoscopic lysis of adhesions and drainage of intra-abdominal abscess. There was no evidence of perforation. Procedure complicated by hypoxic respiratory failure requiring intubation. Patient extubated 9/10. No longer requiring blood pressure support. Since the previous note she has been re-assessed for calories and nutrition goals.  Glucose / Insulin: A1C 5.7, BG range 163 - 200: 24u SSI required.   Electrolytes: magnesium trending up, Cl trending down, CO2 trending up Renal: SCr < 1, stable  (good UOP) LFTs / TGs: LFTs WNL at baseline, TG 69 at baseline, 103 on 9/13  Prealbumin / albumin: <5 / 1.5 Intake / Output; MIVF: UOP of 7.7 L on 9/12. Continue to monitor I&Os and needfor intermittent Lasix as tolerated to maintain euvolemia.  GI Imaging: 9/7 CT abdomen pelvis: colonic perforation related to diverticulitis with worsening fluid and gas containing abscesses is throughout the low abdomen and pelvis many with interloop and mesenteric involvement, also with multiple areas of pelvic fluid as well showing generalized inflammation tracking to the mid abdomen. Interval decompression of the abscess in the cul-de-sac following drain placement. Extensive body wall edema.  9/15 CT concerning for perihepatic fluid collection and possible abscess  Surgeries / Procedures:  9/8 Robotic assisted laparoscopic lysis of adhesions and drainage of intra-abdominal abscess  9/16 Plan for IR placement of drain in perihepatic fluid collection this morning  Central access: 11/19/19 TPN start date:  11/19/19  Nutritional Goals (per RD recommendation on 9/7 and 9/9 --calorie goal slightly decreased but overall the same): kCal: 2700 - 3000 kCal/day, Protein: >135 g/day, Fluid: > 2000 mL Goal TPN rate is 110 mL/hr (provides 140 g of protein and 2984 kcals per day)  Current Nutrition:  NPO   Plan:   continueTPN rate to 110 mL/hr at 1800 (full rate)  Nutritional components: 17% dextrose, 53 g/L amino acids, 34 g/L lipids, 2984 kCal  Electrolytes in TPN: 83mq/L of Na, 224m/L of K, 28m18mL of Ca, 28mE81m of Mg, phosphorous 15 mmol/L, continue Cl:Ac ratio at max (currently 1.59:1)  Add standard MVI and trace elements to TPN  Continue resistant SSI  q4h SSI and adjust as needed - continue insulin to 50 units insulin regular in TPN  Fluid goal met with TPN at full rate ([id number] TPN labs daily until stable then on Mon/Thurs, next in am  RodneDallie Piles/2021 6:58 AM

## 2019-11-26 NOTE — Progress Notes (Signed)
ID Pt more alert and talkative today Pain abdomen better controlled with meds Patient Vitals for the past 24 hrs:  BP Temp Temp src Pulse Resp SpO2 Weight  11/26/19 1541 -- -- -- -- -- -- (!) 197 kg  11/26/19 1513 -- -- -- -- -- -- (!) (P) 196.5 kg  11/26/19 1155 122/81 99 F (37.2 C) Oral 100 (!) 24 100 % --  11/26/19 1055 (!) 135/105 99.3 F (37.4 C) Oral (!) 121 (!) 28 99 % --  11/26/19 0954 (!) 159/100 100.2 F (37.9 C) Oral (!) 112 (!) 29 100 % --  11/26/19 0917 (!) 140/95 98.8 F (37.1 C) Oral (!) 111 (!) 28 100 % --  11/26/19 0351 (!) 147/94 97.6 F (36.4 C) Oral (!) 118 -- 100 % --  11/25/19 2023 (!) 143/101 97.8 F (36.6 C) Oral (!) 118 20 100 % --  awake and alert Chest b/l air entry abd distended Edema of the abdominal wall Has 2 drains   CBC Latest Ref Rng & Units 11/26/2019 11/25/2019 11/24/2019  WBC 4.0 - 10.5 K/uL 17.1(H) 17.0(H) 15.8(H)  Hemoglobin 12.0 - 15.0 g/dL 7.4(L) 7.5(L) 7.1(L)  Hematocrit 36 - 46 % 23.5(L) 24.4(L) 23.3(L)  Platelets 150 - 400 K/uL 310 299 233    CMP Latest Ref Rng & Units 11/26/2019 11/25/2019 11/24/2019  Glucose 70 - 99 mg/dL 616(W) 737(T) 062(I)  BUN 6 - 20 mg/dL 16 16 13   Creatinine 0.44 - 1.00 mg/dL 9.48 5.46  Sodium 135 - 145 mmol/L 139 139 140  Potassium 3.5 - 5.1 mmol/L 4.5 4.9 4.7  Chloride 98 - 111 mmol/L 94(L) 98 100  CO2 22 - 32 mmol/L 37(H) 35(H) 35(H)  Calcium 8.9 - 10.3 mg/dL 7.7(L) 7.5(L) 7.5(L)  Total Protein 6.5 - 8.1 g/dL 7.5 - -  Total Bilirubin 0.3 - 1.2 mg/dL 0.4 - -  Alkaline Phos 38 - 126 U/L 83 - -  AST 15 - 41 U/L 24 - -  ALT 0 - 44 U/L 20 - -     Imaging   Impression/recommendation  Perforated diverticulitis with extensive intra-abdominal loop abscesses Status post laparoscopy and washout.on 11/18/19  Because of extensive abscessesshe is currently on meropenem anidulafungin. The leukocytosis is increasing. CT abdomen done 9/16 shows an abscess along rt   lateral margin liver and organizing  abscesses central abdominal mesentry - . IR is going to place another drain . Discussed with Dr.Rodenberg, not a surgical candidate because of multiple risk factors -including excess BMI. Risk from further surgery outweighs any benefit.  We need to keep imaging PRN and place drains as needed if she has ongoing  leucocytosis and fever. Once she attains adequate source control can stop  meropenem and anidulafungin   Morbid obesity  Discussed management with care team and patient and her mom

## 2019-11-27 ENCOUNTER — Inpatient Hospital Stay: Payer: BC Managed Care – PPO

## 2019-11-27 DIAGNOSIS — K5720 Diverticulitis of large intestine with perforation and abscess without bleeding: Secondary | ICD-10-CM | POA: Diagnosis not present

## 2019-11-27 LAB — RENAL FUNCTION PANEL
Albumin: 1.5 g/dL — ABNORMAL LOW (ref 3.5–5.0)
Anion gap: 7 (ref 5–15)
BUN: 16 mg/dL (ref 6–20)
CO2: 37 mmol/L — ABNORMAL HIGH (ref 22–32)
Calcium: 7.7 mg/dL — ABNORMAL LOW (ref 8.9–10.3)
Chloride: 93 mmol/L — ABNORMAL LOW (ref 98–111)
Creatinine, Ser: 0.58 mg/dL (ref 0.44–1.00)
GFR calc Af Amer: >60 mL/min (ref >60)
GFR calc non Af Amer: >60 mL/min (ref >60)
Glucose, Bld: 185 mg/dL — ABNORMAL HIGH (ref 70–99)
Phosphorus: 3.1 mg/dL (ref 2.5–4.6)
Potassium: 4.6 mmol/L (ref 3.5–5.1)
Sodium: 137 mmol/L (ref 135–145)

## 2019-11-27 LAB — GLUCOSE, CAPILLARY
Glucose-Capillary: 166 mg/dL — ABNORMAL HIGH (ref 70–99)
Glucose-Capillary: 169 mg/dL — ABNORMAL HIGH (ref 70–99)
Glucose-Capillary: 180 mg/dL — ABNORMAL HIGH (ref 70–99)
Glucose-Capillary: 183 mg/dL — ABNORMAL HIGH (ref 70–99)
Glucose-Capillary: 207 mg/dL — ABNORMAL HIGH (ref 70–99)

## 2019-11-27 LAB — MAGNESIUM: Magnesium: 2 mg/dL (ref 1.7–2.4)

## 2019-11-27 MED ORDER — MIDAZOLAM HCL 2 MG/2ML IJ SOLN
INTRAMUSCULAR | Status: DC | PRN
  Administered 2019-11-27: 1 mg via INTRAVENOUS

## 2019-11-27 MED ORDER — FENTANYL CITRATE (PF) 100 MCG/2ML IJ SOLN
INTRAMUSCULAR | Status: AC
  Filled 2019-11-27: qty 2

## 2019-11-27 MED ORDER — SODIUM CHLORIDE 0.9% FLUSH
5.0000 mL | Freq: Three times a day (TID) | INTRAVENOUS | Status: DC
  Administered 2019-11-27 – 2019-12-21 (×62): 5 mL

## 2019-11-27 MED ORDER — INSULIN ASPART 100 UNIT/ML ~~LOC~~ SOLN
0.0000 [IU] | Freq: Four times a day (QID) | SUBCUTANEOUS | Status: DC
  Administered 2019-11-27: 7 [IU] via SUBCUTANEOUS
  Administered 2019-11-27: 4 [IU] via SUBCUTANEOUS
  Administered 2019-11-28: 7 [IU] via SUBCUTANEOUS
  Administered 2019-11-28 – 2019-11-29 (×4): 4 [IU] via SUBCUTANEOUS
  Administered 2019-11-29: 7 [IU] via SUBCUTANEOUS
  Administered 2019-11-29 – 2019-12-01 (×8): 4 [IU] via SUBCUTANEOUS
  Administered 2019-12-02: 3 [IU] via SUBCUTANEOUS
  Administered 2019-12-02: 4 [IU] via SUBCUTANEOUS
  Administered 2019-12-02: 3 [IU] via SUBCUTANEOUS
  Administered 2019-12-02 – 2019-12-04 (×9): 4 [IU] via SUBCUTANEOUS
  Administered 2019-12-05: 7 [IU] via SUBCUTANEOUS
  Administered 2019-12-05: 3 [IU] via SUBCUTANEOUS
  Administered 2019-12-05: 4 [IU] via SUBCUTANEOUS
  Administered 2019-12-06: 3 [IU] via SUBCUTANEOUS
  Administered 2019-12-06 (×2): 4 [IU] via SUBCUTANEOUS
  Administered 2019-12-06 – 2019-12-08 (×5): 3 [IU] via SUBCUTANEOUS
  Administered 2019-12-08: 4 [IU] via SUBCUTANEOUS
  Administered 2019-12-08: 3 [IU] via SUBCUTANEOUS
  Administered 2019-12-08 – 2019-12-09 (×5): 4 [IU] via SUBCUTANEOUS
  Administered 2019-12-10 (×2): 3 [IU] via SUBCUTANEOUS
  Administered 2019-12-10 – 2019-12-11 (×3): 4 [IU] via SUBCUTANEOUS
  Administered 2019-12-11: 3 [IU] via SUBCUTANEOUS
  Administered 2019-12-11 (×2): 4 [IU] via SUBCUTANEOUS
  Administered 2019-12-12 – 2019-12-15 (×12): 3 [IU] via SUBCUTANEOUS
  Filled 2019-11-27 (×65): qty 1

## 2019-11-27 MED ORDER — FENTANYL CITRATE (PF) 100 MCG/2ML IJ SOLN
INTRAMUSCULAR | Status: DC | PRN
  Administered 2019-11-27: 50 ug via INTRAVENOUS

## 2019-11-27 MED ORDER — FLUCONAZOLE IN SODIUM CHLORIDE 400-0.9 MG/200ML-% IV SOLN
800.0000 mg | INTRAVENOUS | Status: DC
  Administered 2019-11-28: 800 mg via INTRAVENOUS
  Administered 2019-11-29: 400 mg via INTRAVENOUS
  Administered 2019-11-30 – 2019-12-02 (×3): 800 mg via INTRAVENOUS
  Administered 2019-12-03: 400 mg via INTRAVENOUS
  Filled 2019-11-27 (×6): qty 400

## 2019-11-27 MED ORDER — TRACE MINERALS CU-MN-SE-ZN 300-55-60-3000 MCG/ML IV SOLN
INTRAVENOUS | Status: AC
  Filled 2019-11-27: qty 936

## 2019-11-27 MED ORDER — MIDAZOLAM HCL 2 MG/2ML IJ SOLN
INTRAMUSCULAR | Status: AC
  Filled 2019-11-27: qty 2

## 2019-11-27 NOTE — Progress Notes (Signed)
PT Cancellation Note  Patient Details Name: Amanda Reyes MRN: [id number] DOB: [birth year]   Cancelled Treatment:    Reason Eval/Treat Not Completed: Other (comment).  Pt's HR noted to be around 120 bpm on telemetry screen.  Nurse reports pt recently returned from procedure and recommending holding therapy at this time.  Will re-attempt PT session at a later date/time.  Hendricks Limes, PT 11/27/19, 1:39 PM

## 2019-11-27 NOTE — Progress Notes (Addendum)
   11/27/19 1345  Clinical Encounter Type  Visited With Patient and family together  Visit Type Follow-up  Referral From Chaplain  Consult/Referral To Chaplain  When chaplain entered the room, pt's mother said she was mad. Chaplain inquired about what? Pt's mother claimed that the nurse didn't tell her the truth. Pt's mother is trying to get some disability paper completed by the doctor. Chaplain waited at the nurse's station for nurse while she completed working with another pt. When the nurse came out of the other room, chaplain told her about her concerns. Nurse said the paperwork has to be completed at the doctor's office. Chaplain asked NT if he could get doctor's office information and her did. Chaplain wrote doctor's information on a sticky and took it to pt's mother. Pt's mother was still upset. Chaplain asked when paperwork was due and she handed form chaplain saying it is due today. Chaplain questioned her about the timing and she said that she has been trying to get forms completed. Chaplain said if the nurse received the form today, that she has to follow the procedures laid out and that she is unable to get the form filled out today. Pt tried to confirm what chaplain was saying, but her mother didn't want to hear it. Chaplain handed pt's mother the sticky and encouraged her to reach out to the doctor's office. Chaplain told patient her availability and told her to take care and she left.

## 2019-11-27 NOTE — Progress Notes (Signed)
Progress Note    KEONIA PASKO  GYJ:[id number] DOB: 21-Sep-1985  DOA: 11/09/2019 PCP: Patient, No Pcp Per      Brief Narrative:    Medical records reviewed and are as summarized below:  Amanda Reyes is a 34 y.o. female       Assessment/Plan:   Active Problems:   Diverticulitis of colon with perforation   Diverticulitis of large intestine with perforation and abscess   Nutrition Problem: Inadequate oral intake Etiology: inability to eat  Signs/Symptoms: NPO status   Body mass index is 64.14 kg/m.  (Morbid obesity)  Acute hypoxic and hypercapnic respiratory failure Obesity hypoventilation syndrome Probable severe OSA Type II DM with hyperglycemia Hypoalbuminemia Perihepatic abscess Leukocytosis Sinus tachycardia-likely from underlying infection AKI-resolved   PLAN  S/p CT-guided 12 French right abdominal abscess drain placement on 11/27/2019.  Continue IV meropenem and anidulafungin.  Recommend that DVT prophylaxis with heparin or Lovenox be resumed.  She still on TPN for nutrition  Continue 3 L/min oxygen via nasal cannula.  Requested documentation of oxygen saturation on room air from nursing staff.  Continue CPAP at night and as needed during the day Follow-up with general surgeon for further recommendations  Plan discussed with ID specialist, Dr. Joylene Draft Plan discussed with her mother at the bedside.    Diet Order            Diet NPO time specified Except for: Sips with Meds, Ice Chips  Diet effective now                    Medications:   . sodium chloride   Intravenous Once  . chlorhexidine  15 mL Mouth Rinse BID  . Chlorhexidine Gluconate Cloth  6 each Topical Q0600  . furosemide  40 mg Intravenous BID  . insulin aspart  0-20 Units Subcutaneous Q6H  . mouth rinse  15 mL Mouth Rinse q12n4p  . pantoprazole (PROTONIX) IV  40 mg Intravenous Q12H  . sodium chloride flush  10-40 mL Intracatheter Q12H  . sodium chloride  flush  5 mL Intracatheter Q8H  . sodium chloride flush  5 mL Intracatheter Q8H   Continuous Infusions: . sodium chloride 250 mL (11/27/19 1616)  . sodium chloride    . sodium chloride Stopped (11/25/19 1858)  . anidulafungin Stopped (11/26/19 1958)  . meropenem (MERREM) IV Stopped (11/27/19 0704)  . TPN ADULT (ION) 110 mL/hr at 11/27/19 1335  . TPN ADULT (ION)       Anti-infectives (From admission, onward)   Start     Dose/Rate Route Frequency Ordered Stop   11/20/19 1600  anidulafungin (ERAXIS) 100 mg in sodium chloride 0.9 % 100 mL IVPB        100 mg 78 mL/hr over 100 Minutes Intravenous Every 24 hours 11/20/19 1227     11/19/19 1400  anidulafungin (ERAXIS) 200 mg in sodium chloride 0.9 % 200 mL IVPB       Note to Pharmacy: Please place order for  tomorrow's dose   200 mg 78 mL/hr over 200 Minutes Intravenous  Once 11/19/19 1311 11/19/19 1714   11/19/19 1400  meropenem (MERREM) 1 g in sodium chloride 0.9 % 100 mL IVPB        1 g 200 mL/hr over 30 Minutes Intravenous Every 8 hours 11/19/19 1320     11/09/19 2200  metroNIDAZOLE (FLAGYL) IVPB 500 mg  Status:  Discontinued       "And" Linked Group Details  500 mg 100 mL/hr over 60 Minutes Intravenous Every 8 hours 11/09/19 1810 11/19/19 1311   11/09/19 2000  ciprofloxacin (CIPRO) IVPB 400 mg  Status:  Discontinued       "And" Linked Group Details   400 mg 200 mL/hr over 60 Minutes Intravenous Every 12 hours 11/09/19 1810 11/19/19 1311   11/09/19 1815  metroNIDAZOLE (FLAGYL) IVPB 500 mg        500 mg 100 mL/hr over 60 Minutes Intravenous  Once 11/09/19 1800 11/09/19 2317   11/09/19 1815  ciprofloxacin (CIPRO) IVPB 400 mg        400 mg 200 mL/hr over 60 Minutes Intravenous  Once 11/09/19 1800 11/09/19 1946             Family Communication/Anticipated D/C date and plan/Code Status   DVT prophylaxis: SCD's Start: 11/23/19 1508 Place and maintain sequential compression device Start: 11/17/19 1017     Code Status:  Full Code  Family Communication: Plan discussed with her mother at the bedside Disposition Plan: Per surgeon           Subjective:   C/o severe abdominal pain  Objective:    Vitals:   11/27/19 1255 11/27/19 1300 11/27/19 1315 11/27/19 1522  BP: (!) 131/94 (!) 134/100 (!) 138/108 138/81  Pulse: (!) 128 (!) 128  (!) 102  Resp: (!) 31 (!) 30 (!) 31 20  Temp:    98.6 F (37 C)  TempSrc:      SpO2: 100% 98% 98% 93%  Weight:      Height:       No data found.   Intake/Output Summary (Last 24 hours) at 11/27/2019 1617 Last data filed at 11/27/2019 1335 Gross per 24 hour  Intake 2554.14 ml  Output 2695 ml  Net -140.86 ml   Filed Weights   11/18/19 1237 11/26/19 1513 11/26/19 1541  Weight: (!) 211 kg (!) 196.5 kg (!) 197 kg    Exam:  GEN: No acute distress SKIN: Warm and dry EYES: Pale, anicteric ENT: Moist mucous membranes CV: Regular rate, tachycardic PULM: No wheezing or rales heard ABD: Soft, obese, right-sided abdominal tenderness, no rebound tenderness or guarding. + JP drain in right lower quadrant with purulent drainage.  CNS: Alert and oriented. EXT: Bilateral leg edema, no tenderness GU: Foley catheter draining amber urine   Data Reviewed:   I have personally reviewed following labs and imaging studies:  Labs: Labs show the following:   Basic Metabolic Panel: Recent Labs  Lab 11/23/19 0543 11/23/19 0543 11/24/19 2841 11/24/19 3244 11/25/19 0501 11/25/19 0501 11/26/19 0642 11/27/19 0638  NA 145  --  140  --  139  --  139 137  K 4.4   < > 4.7   < > 4.9   < > 4.5 4.6  CL 107  --  100  --  98  --  94* 93*  CO2 32  --  35*  --  35*  --  37* 37*  GLUCOSE 212*  --  229*  --  163*  --  180* 185*  BUN 13  --  13  --  16  --  16 16  CREATININE 0.58  --  0.50  --  0.60  --  0.56 0.58  CALCIUM 7.3*  --  7.5*  --  7.5*  --  7.7* 7.7*  MG 1.8  --  1.9  --  1.8  --  2.0 2.0  PHOS 2.0*  --  2.1*  --  3.0  --  3.3 3.1   < > = values in this  interval not displayed.   GFR Estimated Creatinine Clearance: 187.1 mL/min (by C-G formula based on SCr of 0.58 mg/dL). Liver Function Tests: Recent Labs  Lab 11/23/19 0543 11/25/19 0501 11/26/19 0642 11/27/19 0638  AST 22  --  24  --   ALT 12  --  20  --   ALKPHOS 62  --  83  --   BILITOT 0.4  --  0.4  --   PROT 6.5  --  7.5  --   ALBUMIN 1.5* 1.7* 1.6* 1.5*   No results for input(s): LIPASE, AMYLASE in the last 168 hours. No results for input(s): AMMONIA in the last 168 hours. Coagulation profile No results for input(s): INR, PROTIME in the last 168 hours.  CBC: Recent Labs  Lab 11/21/19 1230 11/23/19 0543 11/24/19 0635 11/25/19 0501 11/26/19 1500  WBC 19.7* 15.2* 15.8* 17.0* 17.1*  NEUTROABS 16.4* 11.0*  --   --   --   HGB 7.6* 7.0* 7.1* 7.5* 7.4*  HCT 22.5* 22.6* 23.3* 24.4* 23.5*  MCV 83.0 78.7* 80.3 78.2* 77.6*  PLT 236 236 233 299 310   Cardiac Enzymes: No results for input(s): CKTOTAL, CKMB, CKMBINDEX, TROPONINI in the last 168 hours. BNP (last 3 results) No results for input(s): PROBNP in the last 8760 hours. CBG: Recent Labs  Lab 11/26/19 2124 11/26/19 2347 11/27/19 0411 11/27/19 0743 11/27/19 1136  GLUCAP 220* 191* 166* 169* 180*   D-Dimer: No results for input(s): DDIMER in the last 72 hours. Hgb A1c: No results for input(s): HGBA1C in the last 72 hours. Lipid Profile: No results for input(s): CHOL, HDL, LDLCALC, TRIG, CHOLHDL, LDLDIRECT in the last 72 hours. Thyroid function studies: No results for input(s): TSH, T4TOTAL, T3FREE, THYROIDAB in the last 72 hours.  Invalid input(s): FREET3 Anemia work up: No results for input(s): VITAMINB12, FOLATE, FERRITIN, TIBC, IRON, RETICCTPCT in the last 72 hours. Sepsis Labs: Recent Labs  Lab 11/21/19 1230 11/21/19 1230 11/22/19 0443 11/23/19 0543 11/24/19 0635 11/25/19 0501 11/26/19 1500  PROCALCITON 2.27  --  1.98 1.11  --   --   --   WBC 19.7*   < >  --  15.2* 15.8* 17.0* 17.1*   < > =  values in this interval not displayed.    Microbiology Recent Results (from the past 240 hour(s))  MRSA PCR Screening     Status: None   Collection Time: 11/18/19  6:36 PM   Specimen: Nasopharyngeal  Result Value Ref Range Status   MRSA by PCR NEGATIVE NEGATIVE Final    Comment:        The GeneXpert MRSA Assay (FDA approved for NASAL specimens only), is one component of a comprehensive MRSA colonization surveillance program. It is not intended to diagnose MRSA infection nor to guide or monitor treatment for MRSA infections. Performed at [facility], [address] [place], [place] [address]     Procedures and diagnostic studies:  CT IMAGE GUIDED DRAINAGE BY PERCUTANEOUS CATHETER  Result Date: 11/27/2019 INDICATION: Right abdominal abscess EXAM: CT-guided 12 French drainage right abdominal abscess MEDICATIONS: The patient is currently admitted to the hospital and receiving intravenous antibiotics. The antibiotics were administered within an appropriate time frame prior to the initiation of the procedure. ANESTHESIA/SEDATION: Fentanyl 1.0 mcg IV; Versed 50 mg IV Moderate Sedation Time:  12 minutes The patient was continuously monitored during the procedure by the interventional radiology nurse under my  direct supervision. COMPLICATIONS: None immediate. PROCEDURE: Informed written consent was obtained from the patient after a thorough discussion of the procedural risks, benefits and alternatives. All questions were addressed. Maximal Sterile Barrier Technique was utilized including caps, mask, sterile gowns, sterile gloves, sterile drape, hand hygiene and skin antiseptic. A timeout was performed prior to the initiation of the procedure. Previous imaging reviewed. Patient positioned right anterior oblique. Noncontrast localization CT performed. The right abdominal abscess was localized and marked. Under sterile conditions and local anesthesia, an 18 gauge 15 cm access needle was  advanced into the abscess. Needle position confirmed with CT. Syringe aspiration yielded purulent fluid. Sample sent for culture. Guidewire inserted followed by tract dilatation to insert a 12 [place] drain. Drain catheter position confirmed CT. 80 cc purulent fluid aspirated. Catheter secured with Prolene suture and connected to external suction bulb. Sterile dressing applied. No immediate complication. Patient tolerated the procedure well. IMPRESSION: Successful CT-guided 12 French right abdominal abscess drain placement Electronically Signed   By: Judie PetitM.  Shick M.D.   On: 11/27/2019 13:23               LOS: 18 days   Ronisha Herringshaw  [facility]   Pager on [email or web address]. If 7PM-7AM, please contact night-coverage at [email or web address]     11/27/2019, 4:17 PM

## 2019-11-27 NOTE — Therapy (Signed)
Pt refused BIPAP  At this time.

## 2019-11-27 NOTE — Progress Notes (Signed)
Pharmacy Antibiotic Note  Amanda Reyes is a 34 y.o. female admitted on 11/09/2019 with diverticulitis of colon with perforation. Patient day 9 s/p laparoscopic lysis of adhesions and drainage of intra-abdominal abscess with further placement of RLQ drain of 9/8. Pharmacy has been consulted for meropenem and anidulafungin dosing. ID is following.   Today, 9/17:  Day #9 Meropenem and Anidulafungin, day #19 abx  Leukocytosis remains present (WBC 17.1 on 9/16)  Afebrile since 9/8  O2 Sat 100% on 3L [place]   IR to place drain, scheduled for 9/16 but cancelled  Patient will need adequate source control prior to discontinuation of antibiotics.    Cultures:  BCx 8/30: NG  UCx 8/30: NG Abscess 9/3: E. Coli & Streptococcus Group C, pan-sensitive   Gram stain of abscess 9/3: abundant gram negative rods, abundant gram positive cocci in pairs and chains, few gram negative rods  Scr: 0.50 (9/14), good UOP   Plan: Continue meropenem 1g every 8 hours and anidulafungin 100mg  every 24 hours until adequate source control is achieved. May consider increasing anidulafungin dose to 125mg  (25% increase for TBW>140kg). Continue to monitor renal function daily throughout therapy. Monitor LFTs weekly throughout therapy with anidulafungin.   Height: 5\' 9"  (175.3 cm) Weight: (!) 197 kg (434 lb 4.9 oz) IBW/kg (Calculated) : 66.2  Temp (24hrs), Avg:99.2 F (37.3 C), Min:98.8 F (37.1 C), Max:100.2 F (37.9 C)  Recent Labs  Lab 11/21/19 1230 11/22/19 0443 11/23/19 0543 11/24/19 0635 11/25/19 0501 11/26/19 0642 11/26/19 1500 11/27/19 0638  WBC 19.7*  --  15.2* 15.8* 17.0*  --  17.1*  --   CREATININE  --    < > 0.58 0.50 0.60 0.56  --  0.58   < > = values in this interval not displayed.    Estimated Creatinine Clearance: 187.1 mL/min (by C-G formula based on SCr of 0.58 mg/dL).    Allergies  Allergen Reactions  . Penicillins Hives    Did it involve swelling of the face/tongue/throat, SOB, or low BP?  No Did it involve sudden or severe rash/hives, skin peeling, or any reaction on the inside of your mouth or nose? Yes Did you need to seek medical attention at a hospital or doctor's office? No When did it last happen? If all above answers are "NO", may proceed with cephalosporin use..3  . Tomato Rash    Antimicrobials this admission: Meropenem 9/9 >> Anidulagungin 9/9 >> Metronidazole 8/30>>9/8  Ciprofloxacin 8/30>>9/8     Thank you for allowing pharmacy to be a part of this patient's care.  11/9 11/27/2019 8:00 AM

## 2019-11-27 NOTE — Progress Notes (Signed)
Mobility Specialist - Progress Note   11/27/19 1146  Mobility  Activity  (bed exercises)  Level of Assistance Standby assist, set-up cues, supervision of patient - no hands on  Mobility Response Tolerated fair  Mobility performed by Mobility specialist  $Mobility charge 1 Mobility    Pre-mobility: 110 HR, 94% SpO2 Post-mobility: 114 HR, 93% SPO2   Pt laying in bed upon arrival w/ mother present in room. Pt initially refuses d/t pain and feeling lethargic. However, pt's mother insists pt to work on mobility. Pt agreed to bed exercises. Pt encouraged to complete exercises to an extent she can tolerate. Pt able to complete: ankle pumps x 20, hip ab/ad x 10, and quad sets x 12. Pt's ROM limited by pain and generalized weakness. Overall, pt tolerated session fair. Pt having mainly L abdominal pain. Pt's mother states pain is from "fluid in that area". Pt remained in bed w/ all needs placed in reach and mother present in room. Nurse was notified.     Hriday Stai Mobility Specialist  11/27/19, 11:51 AM

## 2019-11-27 NOTE — Progress Notes (Signed)
Patient clinically stable post Abscess Drain placement per Dr Miles Costain,, tolerated well, vitals stable pre and post procedure. Awake/alert and oriented post procedure.report given to [facility] RN at bedside post procedure.

## 2019-11-27 NOTE — Progress Notes (Signed)
Pt requested to be taking off the BiPAP. Pt placed back on 3 liters.

## 2019-11-27 NOTE — Procedures (Signed)
Interventional Radiology Procedure Note  Procedure: ct drain rt abd abscess    Complications: None  Estimated Blood Loss:  min  Findings: 80cc pus  cx sent    Sharen Counter, MD

## 2019-11-27 NOTE — Consult Note (Signed)
Chief Complaint: Intra abdominal abscess. Request is for abscess drain placement  Referring Physician(s): Dr. Richarda Blade  Supervising Physician: Ruel Favors  Patient Status: [facility] - In-pt  History of Present Illness: Amanda Reyes is a 34 y.o. female History of HTN and morbid obesity, choledocholithiasis  s/p cholecystectomy in 2014. Presented to the ED At [facility] with persistent abdominal pain X 2 -3 weeks with worsening in pain X 2 days and dysuria. The patient was found to have a ruptured diverticulitis with peritoneal abscess. IR placed a pelvic abscess drain via trans gluteal approach on 9.3.21. On 9.8. 21 the patient had robotic lysis of adhesions, washout and surgical drain placement to pelvic abscess (2 in total). Patient was transferred to ICU post procedure.  CT abdomen pelvis from 9.15.21 reads Cul-de-sac drainage catheter in place with resolution of abscess in that location. Additional drainage catheter in the lower anterior pelvis with less abscess fluid in that region. Organizing abscess in the anterior central abdominal mesentery with multiple small air bubbles. Probable interloop abscess in the central to right abdomen measuring approximately 7.8 x 6.5 x 4.4 cm. Subcapsular fluid along the lateral margin of the liver with a prominent collection at the caudal tip where it measures approximately 13 x 4.7 x 11.7 cm consistent with abscess. Team is requesting additional intra abdominal abscess drain placements for ongoing fevers and leukocytosis.  Past Medical History:  Diagnosis Date  . Hypertension     Past Surgical History:  Procedure Laterality Date  . CHOLECYSTECTOMY N/A 08/08/2012   Procedure: LAPAROSCOPIC CHOLECYSTECTOMY;  Surgeon: Dalia Heading, MD;  Location: [facility];  Service: General;  Laterality: N/A;  . CYSTOSCOPY  11/18/2019   Procedure: CYSTOSCOPY;  Surgeon: Campbell Lerner, MD;  Location: [facility];  Service: General;;  . ERCP N/A 08/02/2012   Procedure:  ENDOSCOPIC RETROGRADE CHOLANGIOPANCREATOGRAPHY (ERCP);  Surgeon: Malissa Hippo, MD;  Location: [facility];  Service: Gastroenterology;  Laterality: N/A;  . IRRIGATION AND DEBRIDEMENT ABDOMEN  11/18/2019   Procedure: IRRIGATION AND DEBRIDEMENT ABDOMEN; INTRA-ABDOMINAL/PELVIC ABCESSES AND DRAIN PLACEMENT.;  Surgeon: Campbell Lerner, MD;  Location: [facility];  Service: General;;  . PANCREATIC STENT PLACEMENT N/A 08/02/2012   Procedure: PANCREATIC STENT PLACEMENT;  Surgeon: Malissa Hippo, MD;  Location: [facility];  Service: Gastroenterology;  Laterality: N/A;  . ROBOTIC ASSISTED LAPAROSCOPIC LYSIS OF ADHESION  11/18/2019   Procedure: XI ROBOTIC ASSISTED LAPAROSCOPIC LYSIS OF ADHESION;;  Surgeon: Campbell Lerner, MD;  Location: [facility];  Service: General;;  . SPHINCTEROTOMY N/A 08/02/2012   Procedure: SPHINCTEROTOMY;  Surgeon: Malissa Hippo, MD;  Location: [facility];  Service: Gastroenterology;  Laterality: N/A;  . STONE EXTRACTION WITH BASKET N/A 08/02/2012   Procedure: STONE EXTRACTION WITH BASKET;  Surgeon: Malissa Hippo, MD;  Location: [facility];  Service: Gastroenterology;  Laterality: N/A;    Allergies: Penicillins and Tomato  Medications: Prior to Admission medications   Medication Sig Start Date End Date Taking? Authorizing Provider  losartan-hydrochlorothiazide (HYZAAR) 100-25 MG tablet Take 1 tablet by mouth daily. 03/26/19 03/25/20 Yes Willy Eddy, MD  azithromycin (ZITHROMAX Z-PAK) 250 MG tablet Take 2 tablets (500 mg) on  Day 1,  followed by 1 tablet (250 mg) once daily on Days 2 through 5. Patient not taking: Reported on 11/09/2019 12/06/17   Bridget Hartshorn L, PA-C  ondansetron (ZOFRAN) 4 MG tablet Take 1 tablet (4 mg total) by mouth daily as needed. Patient not taking: Reported on 11/09/2019 03/26/19 03/25/20  Willy Eddy, MD  History reviewed. No pertinent family history.  Social History   Socioeconomic History  . Marital status: Single    Spouse name: Not on file  . Number  of children: Not on file  . Years of education: Not on file  . Highest education level: Not on file  Occupational History  . Not on file  Tobacco Use  . Smoking status: Never Smoker  . Smokeless tobacco: Never Used  Substance and Sexual Activity  . Alcohol use: No  . Drug use: No  . Sexual activity: Never    Birth control/protection: Abstinence  Other Topics Concern  . Not on file  Social History Narrative  . Not on file   Social Determinants of Health   Financial Resource Strain:   . Difficulty of Paying Living Expenses: Not on file  Food Insecurity:   . Worried About Programme researcher, broadcasting/film/video in the Last Year: Not on file  . Ran Out of Food in the Last Year: Not on file  Transportation Needs:   . Lack of Transportation (Medical): Not on file  . Lack of Transportation (Non-Medical): Not on file  Physical Activity:   . Days of Exercise per Week: Not on file  . Minutes of Exercise per Session: Not on file  Stress:   . Feeling of Stress : Not on file  Social Connections:   . Frequency of Communication with Friends and Family: Not on file  . Frequency of Social Gatherings with Friends and Family: Not on file  . Attends Religious Services: Not on file  . Active Member of Clubs or Organizations: Not on file  . Attends Banker Meetings: Not on file  . Marital Status: Not on file     Review of Systems: A 12 point ROS discussed and pertinent positives are indicated in the HPI above.  All other systems are negative.  Review of Systems  Constitutional: Negative for fatigue and fever.  HENT: Negative for congestion.   Respiratory: Negative for cough and shortness of breath.   Gastrointestinal: Positive for abdominal pain ( right sided flank - abdominal pain "near liver"). Negative for diarrhea, nausea and vomiting.    Vital Signs: BP 135/90 (BP Location: Left Wrist)   Pulse (!) 105   Temp 98.9 F (37.2 C) (Oral)   Resp 16   Ht 5\' 9"  (1.753 m)   Wt (!) 434 lb 4.9  oz (197 kg)   LMP 11/11/2019   SpO2 100%   BMI 64.14 kg/m   Physical Exam Vitals and nursing note reviewed.  Constitutional:      Appearance: She is well-developed. She is obese. She is ill-appearing.  HENT:     Head: Normocephalic and atraumatic.  Eyes:     Conjunctiva/sclera: Conjunctivae normal.  Cardiovascular:     Rate and Rhythm: Normal rate and regular rhythm.     Heart sounds: Normal heart sounds.  Pulmonary:     Effort: Pulmonary effort is normal.     Breath sounds: Normal breath sounds.  Musculoskeletal:     Cervical back: Normal range of motion.  Neurological:     Mental Status: She is alert and oriented to person, place, and time.     Imaging: DG Chest 1 View  Result Date: 11/09/2019 CLINICAL DATA:  LEFT-sided pain. EXAM: CHEST  1 VIEW COMPARISON:  03/25/2019 chest radiograph FINDINGS: The cardiomediastinal silhouette is unremarkable. Mild peribronchial thickening again noted. There is no evidence of focal airspace disease, pulmonary edema, suspicious pulmonary nodule/mass, pleural  effusion, or pneumothorax. No acute bony abnormalities are identified. IMPRESSION: No active disease. Electronically Signed   By: Harmon PierJeffrey  Hu M.D.   On: 11/09/2019 18:18   DG Abd 1 View  Result Date: 11/18/2019 CLINICAL DATA:  NG tube placement. EXAM: ABDOMEN - 1 VIEW COMPARISON:  11/10/2019 FINDINGS: The NG tube tip projects over the gastric antrum/pylorus. The tip is pointed distally. IMPRESSION: NG tube tip projects over the gastric antrum/pylorus. Electronically Signed   By: Katherine Mantlehristopher  Green M.D.   On: 11/18/2019 19:28   DG Abd 1 View  Result Date: 11/10/2019 CLINICAL DATA:  Left colonic diverticulitis with evidence for perforation on CT scan yesterday. EXAM: ABDOMEN - 1 VIEW COMPARISON:  CT scan from yesterday. FINDINGS: Right-sided cubitus views of the abdomen are limited by patient body habitus. No gross intraperitoneal free air evident. No gaseous bowel dilatation to suggest  obstruction. IMPRESSION: Limited study without evidence of gross intraperitoneal free air under the non dependent abdominal wall. Patient was noted to have multiple sites of extraluminal gas in the pelvis on yesterday's CT. Electronically Signed   By: Kennith CenterEric  Mansell M.D.   On: 11/10/2019 10:52   CT ABDOMEN PELVIS W CONTRAST  Result Date: 11/25/2019 CLINICAL DATA:  Leukocytosis.  Abscess drainage. EXAM: CT ABDOMEN AND PELVIS WITH CONTRAST TECHNIQUE: Multidetector CT imaging of the abdomen and pelvis was performed using the standard protocol following bolus administration of intravenous contrast. CONTRAST:  125mL OMNIPAQUE IOHEXOL 300 MG/ML  SOLN COMPARISON:  11/17/2019.  11/12/2019.  11/09/2019. FINDINGS: Lower chest: Moderate right effusion with dependent pulmonary atelectasis. Small pleural effusion on the left. Hepatobiliary: Previous cholecystectomy. No liver parenchymal lesion. Subcapsular fluid along the lateral margin of the liver with a prominent collection at the caudal tip where it measures approximately 13 x 4.7 x 11.7 cm consistent with abscess. Pancreas: Negative Spleen: Negative Adrenals/Urinary Tract: Adrenal glands are normal. No significant kidney finding. Bladder catheter in place. Stomach/Bowel: Cul-de-sac drainage catheter in place with resolution of abscess in that location. Additional drainage catheter in the lower anterior pelvis with less abscess fluid in that region. Organizing abscess with multiple dots of air in the anterior central abdominal mesentery. Probable interloop abscess in the central to right abdomen measuring approximately 7.8 x 6.5 x 4.4 cm. Ileus pattern with dilated fluid and air-filled loops of small intestine. Vascular/Lymphatic: Negative Reproductive: No reproductive pathology suspected. Other: Body wall edema. Musculoskeletal: No acute finding. IMPRESSION: 1. Cul-de-sac drainage catheter in place with resolution of abscess in that location. Additional drainage catheter  in the lower anterior pelvis with less abscess fluid in that region. 2. Organizing abscess in the anterior central abdominal mesentery with multiple small air bubbles. Probable interloop abscess in the central to right abdomen measuring approximately 7.8 x 6.5 x 4.4 cm. 3. Subcapsular fluid along the lateral margin of the liver with a prominent collection at the caudal tip where it measures approximately 13 x 4.7 x 11.7 cm consistent with abscess. 4. Ileus pattern with dilated fluid and air-filled loops of small intestine. 5. Moderate right effusion with dependent pulmonary atelectasis. Small pleural effusion on the left. Body wall edema. Electronically Signed   By: Paulina FusiMark  Shogry M.D.   On: 11/25/2019 14:27   CT ABDOMEN PELVIS W CONTRAST  Addendum Date: 11/17/2019   ADDENDUM REPORT: 11/17/2019 11:48 ADDENDUM: These results were called by telephone at the time of interpretation on 11/17/2019 at 11:48 am to provider [facility] , who verbally acknowledged these results. Electronically Signed   By: Juliene PinaGeoffrey  Wile M.D.   On: 11/17/2019 11:48   Result Date: 11/17/2019 CLINICAL DATA:  Abdominal abscess/infection EXAM: CT ABDOMEN AND PELVIS WITH CONTRAST TECHNIQUE: Multidetector CT imaging of the abdomen and pelvis was performed using the standard protocol following bolus administration of intravenous contrast. CONTRAST:  OMNIPAQUE IOHEXOL 300 MG/ML  SOLN COMPARISON:  11/12/2019 FINDINGS: Lower chest: Incidental imaging of the lung bases is unremarkable. Hepatobiliary: Signs of hepatic steatosis and lobular hepatic contours. Portal vein is patent. Hepatic veins are patent. No focal, suspicious hepatic lesion. Post cholecystectomy peer pneumobilia as on the previous exam. Pancreas: Pancreas is normal without ductal dilation or signs of inflammation. Spleen: Spleen normal in size and contour. Adrenals/Urinary Tract: Adrenal glands are normal. Symmetric renal enhancement. Low-density lesion in the upper pole of the  RIGHT kidney is likely a cyst and is unchanged. No hydronephrosis. Generalized stranding throughout the pelvis about the urinary bladder in the setting of pelvic abscesses. Stomach/Bowel: Proximal small bowel dilation. Dilation is similar to the prior exam. Numerous interloop abscesses in the low abdomen. Largest area measuring 8.0 x 6.5 cm previously with gas and fluid in this area measuring approximately 4.1 x 3.9 cm. Another interloop collection in the RIGHT hemiabdomen (image 67, series 2) 5.5 x 3.8 cm, gas tracking away from this area into the LEFT lower quadrant and with gas in fluid along the LEFT anterior abdomen. Gas containing collection in the LEFT anterior abdomen measuring 8.1 x 3.7 cm, more gas in this area perhaps better organized collection and on the prior study where it measured approximately 7.9 x 3.8 cm. Signs of colonic perforation with adjacent collection, clear communication between the colonic lumen in adjacent collections in the lower abdomen is seen on image 75 of series 2 with an adjacent gas and fluid containing collection in the central pelvis (image 71, series 2) this measures approximately 4 x 2.6 cm as compared to 3.5 cm in greatest dimension on the prior study. Generalized stranding throughout the pelvis. Additional fluid which layers inferiorly, mixed with gas and extending atop the urinary bladder and from LEFT to RIGHT across the lower abdomen and pelvis (image 77, series 2) dominant collection in this area which extends inferiorly with horseshoe shaped when viewed in coronal view measuring approximately 6.6 x 5.0 cm with smaller component extending towards the RIGHT abdomen over the urinary bladder and uterine fundus. Vascular/Lymphatic: Scattered lymph nodes throughout the retroperitoneum similar to the prior exam. No signs of atherosclerotic changes. Portal vein is patent. Scattered small lymph nodes also throughout the pelvis in the setting of diffuse inflammation.  Reproductive: Uterus surrounded by inflammation and pelvic fluid collections. Other: Extensive body wall edema. Generalized inflammation in the pelvis and lower abdomen. Pelvic drain in place, interval decompression of the abscess in the cul-de-sac. Musculoskeletal: No acute bone finding. No destructive bone process. IMPRESSION: 1. Signs of colonic perforation related to diverticulitis with worsening fluid and gas containing abscesses is throughout the low abdomen and pelvis many with interloop and mesenteric involvement, also with multiple areas of pelvic fluid as well showing generalized inflammation tracking to the mid abdomen. 2. Interval decompression of the abscess in the cul-de-sac following drain placement. 3. Signs of hepatic steatosis and lobular hepatic contours. 4. Post cholecystectomy with pneumobilia as on the previous exam. 5. Extensive body wall edema. 6. Aortic atherosclerosis. These results will be called to the ordering clinician or representative by the Radiologist Assistant, and communication documented in the PACS or [employer]. Aortic Atherosclerosis (ICD10-I70.0). Electronically Signed: By:  Donzetta Kohut M.D. On: 11/17/2019 11:31   CT ABDOMEN PELVIS W CONTRAST  Result Date: 11/12/2019 CLINICAL DATA:  Left lower quadrant abdominal pain, perforated diverticulitis EXAM: CT ABDOMEN AND PELVIS WITH CONTRAST TECHNIQUE: Multidetector CT imaging of the abdomen and pelvis was performed using the standard protocol following bolus administration of intravenous contrast. CONTRAST:  OMNIPAQUE IOHEXOL 300 MG/ML  SOLN COMPARISON:  11/09/2019 FINDINGS: Lower chest: Small right pleural effusion has developed with minimal right basilar compressive atelectasis. Left lung bases clear. Visualized heart and pericardium are unremarkable. Hepatobiliary: Cholecystectomy has been performed. Mild pneumobilia is present in keeping with probable prior sphincterotomy. Mild hepatomegaly is stable. No focal  intrahepatic lesion identified. Pancreas: Unremarkable Spleen: Unremarkable Adrenals/Urinary Tract: Stable probable cortical cyst within the right upper pole and punctate 2 mm nonobstructing calculus within the right lower pole. The kidneys and adrenal glands are otherwise unremarkable. Foley catheter balloon is seen within a decompressed bladder lumen. Stomach/Bowel: There is progressive distension of multiple proximal loops of small bowel with gradual transition to more normal caliber small bowel within the area of inflammation within the pelvis suggesting changes of a a partial small bowel obstruction and/or developing ileus secondary to the inflammatory pelvic process. The mid and distal small bowel as well as the colon is decompressed. There is increasing mesenteric infiltration within the pelvis, peritoneal enhancement within the pelvis, and shotty mesenteric and retroperitoneal adenopathy all in keeping with progressive inflammatory change. A loculated pericolonic gas and fluid collection has developed within the cul-de-sac in keeping with a focal abscess measuring 6.8 x 8.7 cm at axial image # 83/2. There is increasing, more amorphous extraluminal gas and fluid within the left hemipelvis and anterior peritoneum within the hypogastric region best seen at axial image # 66/2 and 76/2, however, these do not demonstrate a well-defined brim at this time. A similar collection is noted within the deep pelvis within the leaves of the mesentery measuring roughly 3.5 cm at axial image # 72/2. The appendix is unremarkable. Vascular/Lymphatic: No frankly pathologic adenopathy within the abdomen and pelvis. The abdominal vasculature is unremarkable. Reproductive: Uterus and bilateral adnexa are unremarkable. Other: Increasing subcutaneous edema is noted within pannus and flanks bilaterally. Musculoskeletal: No acute or significant osseous findings. IMPRESSION: 1. Interval development of a 6.8 x 8.7 cm loculated  pericolonic gas and fluid collection within the cul-de-sac in keeping with a focal abscess. There is increasing, more amorphous extraluminal gas and fluid within the left hemipelvis, deep pelvis and anterior peritoneum within the hypogastric region, however, these do not demonstrate a well-defined brim at this time. 2. Increasing distension of multiple proximal loops of small bowel with gradual transition to more normal caliber small bowel within the area of inflammation within the pelvis suggesting changes of a partial small bowel obstruction and/or developing ileus secondary to the inflammatory pelvic process. 3. Progressive inflammatory change within the pelvis. 4. Increasing subcutaneous edema within pannus and flanks bilaterally in keeping with developing anasarca Electronically Signed   By: Helyn Numbers MD   On: 11/12/2019 23:02   CT ABDOMEN PELVIS W CONTRAST  Result Date: 11/09/2019 CLINICAL DATA:  Abdominal pain EXAM: CT ABDOMEN AND PELVIS WITH CONTRAST TECHNIQUE: Multidetector CT imaging of the abdomen and pelvis was performed using the standard protocol following bolus administration of intravenous contrast. CONTRAST:  OMNIPAQUE IOHEXOL 300 MG/ML  SOLN COMPARISON:  None. FINDINGS: Lower chest: Lung bases demonstrate no acute consolidation or effusion. Normal cardiac size. Hepatobiliary: No focal liver abnormality is seen. Status  post cholecystectomy. No biliary dilatation. Pancreas: Unremarkable. No pancreatic ductal dilatation or surrounding inflammatory changes. Spleen: Normal in size without focal abnormality. Adrenals/Urinary Tract: Adrenal glands are normal. Kidneys show no hydronephrosis. Small cyst in the upper pole of the right kidney. Foley catheter in the bladder which is decompressed. Stomach/Bowel: The stomach is nonenlarged. No dilated small bowel. Negative appendix. Prominent submucosal fat deposition at the colon consistent with chronic inflammatory process. Left colon  diverticular disease. Focal wall thickening and inflammatory change at the sigmoid colon. More focal inflammatory process at the sigmoid colon with soft tissue stranding and small foci of gas, consistent with perforation. No well organized abscess at this time. Vascular/Lymphatic: Nonaneurysmal aorta.  No suspicious adenopathy. Reproductive: Uterus and bilateral adnexa are unremarkable. Other: Multiple foci of free air within the abdomen, for example series 2, image number 33, series 2, image number 37, series 2, image number 39/40, series 2, image number 50, and small focus of free air near the umbilicus. Small amount of mildly complex fluid in the pelvis without strong rim enhancement to suggest organized abscess at this time. Musculoskeletal: No acute or significant osseous findings. IMPRESSION: 1. Focal wall thickening and inflammatory change at the sigmoid colon with extraluminal gas collection and considerable focal inflammatory change, consistent with acute perforated diverticulitis. There is a small focus of free air in the umbilical region. 2. Small pelvic fluid collection with slight increased density values though without strong rim enhancement at this time to suggest organized pelvic abscess. Critical Value/emergent results were called by telephone at the time of interpretation on 11/09/2019 at 6:02 pm to provider [facility] , who verbally acknowledged these results. Electronically Signed   By: Jasmine Pang M.D.   On: 11/09/2019 18:02   DG Chest Port 1 View  Result Date: 11/20/2019 CLINICAL DATA:  Acute respiratory failure with hypoxia EXAM: PORTABLE CHEST 1 VIEW COMPARISON:  11/09/2019 FINDINGS: Endotracheal to is 3 cm above the carina. Right central line tip in the SVC. NG tube is in the stomach. Cardiomegaly with vascular congestion. Suspect mild pulmonary edema. No effusions or acute bony abnormality. IMPRESSION: Cardiomegaly with vascular congestion and probable mild pulmonary edema.  Electronically Signed   By: Charlett Nose M.D.   On: 11/20/2019 02:01   CT IMAGE GUIDED DRAINAGE BY PERCUTANEOUS CATHETER  Result Date: 11/17/2019 CLINICAL DATA:  Ruptured diverticulitis with peritoneal abscess formation. Largest fluid collection is within the central pelvis in the cul-de-sac. EXAM: CT GUIDED CATHETER DRAINAGE OF PELVIC PERITONEAL ABSCESS ANESTHESIA/SEDATION: 2.0 mg IV Versed 100 mcg IV Fentanyl Total Moderate Sedation Time:  28 minutes The patient's level of consciousness and physiologic status were continuously monitored during the procedure by Radiology nursing. PROCEDURE: The procedure, risks, benefits, and alternatives were explained to the patient. Questions regarding the procedure were encouraged and answered. The patient understands and consents to the procedure. A time out was performed prior to initiating the procedure. CT was performed through the pelvis in a prone position. The right transgluteal region was prepped with chlorhexidine in a sterile fashion, and a sterile drape was applied covering the operative field. A sterile gown and sterile gloves were used for the procedure. Local anesthesia was provided with 1% Lidocaine. Under CT guidance, an 18 gauge trocar needle was advanced to the level of the pelvic abscess. After return fluid from the needle, a guidewire was advanced. The percutaneous tract was dilated and a 10 French percutaneous drainage catheter advanced. Catheter position was confirmed by CT. A fluid sample was aspirated  from the drain and sent for culture analysis. The drain was connected to a suction bulb and secured at the skin with a Prolene retention suture and StatLock device. COMPLICATIONS: None FINDINGS: Abscess in the cul-de-sac was targeted for percutaneous drainage from a trans gluteal approach. The abscess contains an air-fluid level and measures approximately 6.5 cm in transverse width. There was grossly purulent fluid return from the collection after needle  puncture. After drain placement, there is good return of purulent fluid. IMPRESSION: CT-guided percutaneous catheter drainage of pelvic abscess in the cul-de-sac with return of purulent fluid. A fluid sample was sent for culture analysis. A 10 French drain was placed and attached to suction bulb drainage. Electronically Signed   By: Irish Lack M.D.   On: 11/17/2019 09:05   [place] EKG SITE RITE  Result Date: 11/17/2019 If Site Rite image not attached, placement could not be confirmed due to current cardiac rhythm.   Labs:  CBC: Recent Labs    11/23/19 0543 11/24/19 0635 11/25/19 0501 11/26/19 1500  WBC 15.2* 15.8* 17.0* 17.1*  HGB 7.0* 7.1* 7.5* 7.4*  HCT 22.6* 23.3* 24.4* 23.5*  PLT 236 233 299 310    COAGS: Recent Labs    11/09/19 1758  INR 1.1  APTT 30    BMP: Recent Labs    11/24/19 0635 11/25/19 0501 11/26/19 0642 11/27/19 0638  NA 140 139 139 137  K 4.7 4.9 4.5 4.6  CL 100 98 94* 93*  CO2 35* 35* 37* 37*  GLUCOSE 229* 163* 180* 185*  BUN 13 16 16 16   CALCIUM 7.5* 7.5* 7.7* 7.7*  CREATININE 0.50 0.60 0.56 0.58  GFRNONAA >60 >60 >60 >60  GFRAA >60 >60 >60 >60    LIVER FUNCTION TESTS: Recent Labs    11/16/19 0534 11/16/19 0534 11/19/19 0436 11/19/19 0436 11/23/19 0543 11/25/19 0501 11/26/19 0642 11/27/19 0638  BILITOT 0.5  --  0.8  --  0.4  --  0.4  --   AST 13*  --  11*  --  22  --  24  --   ALT 12  --  9  --  12  --  20  --   ALKPHOS 71  --  43  --  62  --  83  --   PROT 5.9*  --  5.0*  --  6.5  --  7.5  --   ALBUMIN 2.0*   < > 1.7*   < > 1.5* 1.7* 1.6* 1.5*   < > = values in this interval not displayed.    Assessment and Plan: 34 y.o. female inpatient. History of HTN and morbid obesity, choledocholithiasis  s/p cholecystectomy in 2014. Presented to the ED At [facility] with persistent abdominal pain X 2 -3 weeks with worsening in pain X 2 days and dysuria. The patient was found to have a ruptured diverticulitis with peritoneal abscess. IR placed a  pelvic abscess drain via trans gluteal approach on 9.3.21. On 9.8. 21 the patient had robotic lysis of adhesions, washout and surgical drain placement to pelvic abscess (2 in total). Patient was transferred to ICU post procedure.  CT abdomen pelvis from 9.15.21 reads Cul-de-sac drainage catheter in place with resolution of abscess in that location. Additional drainage catheter in the lower anterior pelvis with less abscess fluid in that region. Organizing abscess in the anterior central abdominal mesentery with multiple small air bubbles. Probable interloop abscess in the central to right abdomen measuring approximately 7.8 x 6.5 x 4.4  cm. Subcapsular fluid along the lateral margin of the liver with a prominent collection at the caudal tip where it measures approximately 13 x 4.7 x 11.7 cm consistent with abscess. Team is requesting additional intra abdominal abscess drain placements for ongoing fevers and leukocytosis     WBC is 17.1. Hgb 7.4. Cultures from pelvic abscess  grew e. Coli and group C strep. Temperature  of 100.2 on 9.16. 21  IR consulted for possible intra-abdominal abscess drain placement. Case has been reviewed and procedure approved by Dr. Collier Salina.  Patient tentatively scheduled for 9.17.21.  Team instructed to: Keep Patient to be NPO after midnight Hold prophylactic anticoagulation 24 hours prior to scheduled procedure.   IR will call patient when ready.  Risks and benefits discussed with the patient including bleeding, infection, damage to adjacent structures, bowel perforation/fistula connection, and sepsis.  All of the patient's and family member's questions were answered, patient is agreeable to proceed. Consent signed and in chart.   Thank you for this interesting consult.  I greatly enjoyed meeting Amanda Reyes and look forward to participating in their care.  A copy of this report was sent to the requesting provider on this date.  Electronically Signed: Alene Mires, NP 11/27/2019, 10:56 AM   I spent a total of 40 Minutes    in face to face in clinical consultation, greater than 50% of which was counseling/coordinating care for intra abdominal abscess drain placement

## 2019-11-27 NOTE — Consult Note (Signed)
PHARMACY - TOTAL PARENTERAL NUTRITION CONSULT NOTE   Indication: Prolonged ileus  Patient Measurements: Height: 5\' 9"  (175.3 cm) Weight: (!) 197 kg (434 lb 4.9 oz) IBW/kg (Calculated) : 66.2 TPN AdjBW (KG): 90.5 Body mass index is 64.14 kg/m.  Assessment: 34 year old female with PMHx of HTN admitted with concern for perforated diverticulitis with contained abscesses. Patient is POD # 9 from laparoscopic lysis of adhesions and drainage of intra-abdominal abscess. There was no evidence of perforation. Procedure complicated by hypoxic respiratory failure requiring intubation. Patient extubated 9/10. No longer requiring blood pressure support. Since the previous note she has been re-assessed for calories and nutrition goals.  Glucose / Insulin: A1C 5.7, BG range 185 - 220: 27u SSI required.   Electrolytes: magnesium trending up, Cl trending down, CO2 trending up Renal: SCr < 1, stable  (good UOP) LFTs / TGs: LFTs WNL at baseline, TG 69 at baseline, 103 on 9/13  Prealbumin / albumin: <5 / 1.5 Intake / Output; MIVF: UOP of 7.7 L on 9/12. Continue to monitor I&Os and needfor intermittent Lasix as tolerated to maintain euvolemia.  GI Imaging: 9/7 CT abdomen pelvis: colonic perforation related to diverticulitis with worsening fluid and gas containing abscesses is throughout the low abdomen and pelvis many with interloop and mesenteric involvement, also with multiple areas of pelvic fluid as well showing generalized inflammation tracking to the mid abdomen. Interval decompression of the abscess in the cul-de-sac following drain placement. Extensive body wall edema.  9/15 CT concerning for perihepatic fluid collection and possible abscess  Surgeries / Procedures:  9/8 Robotic assisted laparoscopic lysis of adhesions and drainage of intra-abdominal abscess  9/17 IR placement of drain in perihepatic fluid collection   Central access: 11/19/19 TPN start date: 11/19/19  Nutritional Goals (per RD  recommendation on 9/7 and 9/9 --calorie goal slightly decreased but overall the same): kCal: 2700 - 3000 kCal/day, Protein: >135 g/day, Fluid: > 2000 mL Goal TPN rate is 90 mL/hr (provides 140 g of protein and 2999 kcals per day)  Current Nutrition:  NPO   Plan:   Switch to concentrated amino acids in order to decrease volume of TPN over concerns for edema  Adjust TPN rate to 90 mL/hr at 1800 using concentrated amino acids  Nutritional components: 21% dextrose, 65 g/L amino acids, 41.5 g/L lipids, 2999 kCal  Electrolytes in TPN: 80mEq/L of Na, 22mEq/L of K, 103mEq/L of Ca, 6mEq/L of Mg, phosphorous 15 mmol/L, continue Cl:Ac ratio at max (currently 1 : 1.04)  Add standard MVI and trace elements to TPN  adjust frequency of resistant SSI  To q6h SSI and continue to adjust as needed - continue insulin to 50 units insulin regular in TPN  Fluid amount lowered to 4m  Monitor TPN labs daily until stable then on Mon/Thurs, next in am 9/18  10/18 11/27/2019 7:12 AM

## 2019-11-27 NOTE — Progress Notes (Signed)
[facility] SURGICAL PROGRESS NOTE  Hospital Day(s): 18.   Post op day(s): 9 Days Post-Op.   Interval History:  Patient seen and examined,  no acute events or new complaints overnight, unfortunately CT guided drainage or perihepatic fluid was not completed yesterday, plan to do so today Patient reports she is doing okay, when asked she states most of her abdominal discomfort is in her right mid to upper abdomen, she is unable to illicit much more She feels her breathing is improved, but she continues to be tachypneic and has upper airway secretions this morning No nausea or emesis Leukocytosis yesterday evening remained elevated but stable at 17.1K; no true fever; T-Max in last 24 hours is 100.2 Stable anemia, Hgb 7.4 Renal function remains normal, sCr - 0.58 Certainly a degree of malnutrition with albumin 1.7 She is currently NPO + TPN Worked with PT; recommending SNF  Vital signs in last 24 hours: [min-max] current  Temp:  [98.8 F (37.1 C)-100.2 F (37.9 C)] 99.2 F (37.3 C) (09/17 0441) Pulse Rate:  [100-121] 107 (09/17 0441) Resp:  [19-29] 19 (09/17 0441) BP: (122-159)/(81-105) 130/82 (09/17 0441) SpO2:  [98 %-100 %] 100 % (09/17 0441) Weight:  [196.5 kg-197 kg] 197 kg (09/16 1541)     Height: 5\' 9"  (175.3 cm) Weight: (!) 197 kg BMI (Calculated): 64.11   Intake/Output last 2 shifts:  09/16 0701 - 09/17 0700 In: 1462 [I.V.:957.5; IV Piggyback:504.5] Out: 3830 [Urine:3800; Drains:30]   Physical Exam:  Constitutional: alert, cooperative and no distress Respiratory:tachypneic,breathing appears labored however this is relatively unchanged from previous,+ upper airway secretions, on [place], refusing BiPAP Cardiovascular:Tachycardicand sinus rhythm  Gastrointestinal:Obese, soft, no appreciable tenderness, non-distended, JP in RLQ still with seropurulent output however this appears to be more serous this morning Genitourinary: Foley in place Musculoskeletal:  1+ edema to bilateral LE Integumentary:Laparoscopic incisions are CDI with dermabond, no erythema or drainage  Labs:  CBC Latest Ref Rng & Units 11/26/2019 11/25/2019 11/24/2019  WBC 4.0 - 10.5 K/uL 17.1(H) 17.0(H) 15.8(H)  Hemoglobin 12.0 - 15.0 g/dL 7.4(L) 7.5(L) 7.1(L)  Hematocrit 36 - 46 % 23.5(L) 24.4(L) 23.3(L)  Platelets 150 - 400 K/uL 310 299 233   CMP Latest Ref Rng & Units 11/26/2019 11/25/2019 11/24/2019  Glucose 70 - 99 mg/dL 11/26/2019) 416(S) 063(K)  BUN 6 - 20 mg/dL 16 16 13   Creatinine 0.44 - 1.00 mg/dL 160(F 0.93  Sodium 135 - 145 mmol/L 139 139 140  Potassium 3.5 - 5.1 mmol/L 4.5 4.9 4.7  Chloride 98 - 111 mmol/L 94(L) 98 100  CO2 22 - 32 mmol/L 37(H) 35(H) 35(H)  Calcium 8.9 - 10.3 mg/dL 7.7(L) 7.5(L) 7.5(L)  Total Protein 6.5 - 8.1 g/dL 7.5 - -  Total Bilirubin 0.3 - 1.2 mg/dL 0.4 - -  Alkaline Phos 38 - 126 U/L 83 - -  AST 15 - 41 U/L 24 - -  ALT 0 - 44 U/L 20 - -    Imaging studies: No new pertinent imaging studies   Assessment/Plan: 34 y.o. female 9 Days Post-Op s/p robotic assisted laparoscopic lysis of adhesion and drainage of intra-abdominal abscess WITHOUT evidence of perforation or frank stoolforperforated diverticulitis, complicated by hypoxic respiratory failure requiring post-operative intubation.   - Plan for IR placement of drain in perihepatic fluid collection this morning; greatly appreciate their assistance                           - Continue NPO  for now; continue TPN at goal rate               - Continue IV ABx; switched to Meropenem, andifungalin added; ID on board  - Continue drains; we may be able to remove posterior right back drain soon as output minimal and serous. Her surgically placed drain in RLQ also appears more serous today than previously - No plans for surgical re-intervention at this time - Monitor abdominal examination; on-going bowel function - Pain control prn; antiemetics  prn - Monitor leukocytosis, renal function - Home medications -Mobilization encouraged as tolerated - DVT prophylaxis; hold for IR procedure   All of the above findings and recommendations were discussed with the patient, and the medical team, and all of patient'squestions were answered to their expressed satisfaction  -- Lynden Oxford, PA-C [facility] 11/27/2019, 7:06 AM [phone] M-F: 7am - 4pm

## 2019-11-27 NOTE — Progress Notes (Addendum)
ID Had a new drain placed on the rt upper quadrant area today- milky fluid in the drain Has 2 other drains Alert,less pain BP 138/81 (BP Location: Left Wrist)   Pulse (!) 102   Temp 98.6 F (37 C)   Resp 20   Ht 5\' 9"  (1.753 m)   Wt (!) 197 kg   LMP 11/11/2019   SpO2 93%   BMI 64.14 kg/m   CBC Latest Ref Rng & Units 11/26/2019 11/25/2019 11/24/2019  WBC 4.0 - 10.5 K/uL 17.1(H) 17.0(H) 15.8(H)  Hemoglobin 12.0 - 15.0 g/dL 7.4(L) 7.5(L) 7.1(L)  Hematocrit 36 - 46 % 23.5(L) 24.4(L) 23.3(L)  Platelets 150 - 400 K/uL 310 299 233    CMP Latest Ref Rng & Units 11/27/2019 11/26/2019 11/25/2019  Glucose 70 - 99 mg/dL 11/27/2019) 160(F) 093(A)  BUN 6 - 20 mg/dL 16 16 16   Creatinine 0.44 - 1.00 mg/dL 355(D 3.22  Sodium 135 - 145 mmol/L 137 139 139  Potassium 3.5 - 5.1 mmol/L 4.6 4.5 4.9  Chloride 98 - 111 mmol/L 93(L) 94(L) 98  CO2 22 - 32 mmol/L 37(H) 37(H) 35(H)  Calcium 8.9 - 10.3 mg/dL 7.7(L) 7.7(L) 7.5(L)  Total Protein 6.5 - 8.1 g/dL - 7.5 -  Total Bilirubin 0.3 - 1.2 mg/dL - 0.4 -  Alkaline Phos 38 - 126 U/L - 83 -  AST 15 - 41 U/L - 24 -  ALT 0 - 44 U/L - 20 -   Impression/recommendation  Perforated diverticulitis with extensive intra-abdominal loop abscesses Status post laparoscopy and washout.on 11/18/19 Because of extensive abscessesshe is currently on meropenem anidulafungin. The leukocytosis is increasing.CT abdomen done 9/16 shows an abscess along rt   lateral margin liver and organizing abscesses central abdominal mesentry - . IR placed another drain  today. Culture sent Change anidulafungin to fluconazole Continue meropenem Discussed with patient and mother. ID will follow her peripherally this weekend- call if needed

## 2019-11-28 DIAGNOSIS — K5720 Diverticulitis of large intestine with perforation and abscess without bleeding: Secondary | ICD-10-CM | POA: Diagnosis not present

## 2019-11-28 LAB — BASIC METABOLIC PANEL
Anion gap: 7 (ref 5–15)
BUN: 17 mg/dL (ref 6–20)
CO2: 38 mmol/L — ABNORMAL HIGH (ref 22–32)
Calcium: 7.6 mg/dL — ABNORMAL LOW (ref 8.9–10.3)
Chloride: 90 mmol/L — ABNORMAL LOW (ref 98–111)
Creatinine, Ser: 0.51 mg/dL (ref 0.44–1.00)
GFR calc Af Amer: >60 mL/min (ref >60)
GFR calc non Af Amer: >60 mL/min (ref >60)
Glucose, Bld: 197 mg/dL — ABNORMAL HIGH (ref 70–99)
Potassium: 4 mmol/L (ref 3.5–5.1)
Sodium: 135 mmol/L (ref 135–145)

## 2019-11-28 LAB — BRAIN NATRIURETIC PEPTIDE: B Natriuretic Peptide: 128.1 pg/mL — ABNORMAL HIGH (ref 0.0–100.0)

## 2019-11-28 LAB — GLUCOSE, CAPILLARY
Glucose-Capillary: 167 mg/dL — ABNORMAL HIGH (ref 70–99)
Glucose-Capillary: 188 mg/dL — ABNORMAL HIGH (ref 70–99)
Glucose-Capillary: 192 mg/dL — ABNORMAL HIGH (ref 70–99)
Glucose-Capillary: 194 mg/dL — ABNORMAL HIGH (ref 70–99)
Glucose-Capillary: 201 mg/dL — ABNORMAL HIGH (ref 70–99)
Glucose-Capillary: 207 mg/dL — ABNORMAL HIGH (ref 70–99)
Glucose-Capillary: 215 mg/dL — ABNORMAL HIGH (ref 70–99)

## 2019-11-28 MED ORDER — ENOXAPARIN SODIUM 40 MG/0.4ML ~~LOC~~ SOLN
40.0000 mg | Freq: Two times a day (BID) | SUBCUTANEOUS | Status: DC
  Administered 2019-11-29 – 2019-12-21 (×45): 40 mg via SUBCUTANEOUS
  Filled 2019-11-28 (×45): qty 0.4

## 2019-11-28 MED ORDER — ENOXAPARIN SODIUM 40 MG/0.4ML ~~LOC~~ SOLN
40.0000 mg | Freq: Once | SUBCUTANEOUS | Status: AC
  Administered 2019-11-28: 40 mg via SUBCUTANEOUS
  Filled 2019-11-28: qty 0.4

## 2019-11-28 MED ORDER — TRACE MINERALS CU-MN-SE-ZN 300-55-60-3000 MCG/ML IV SOLN
INTRAVENOUS | Status: AC
  Filled 2019-11-28: qty 936

## 2019-11-28 NOTE — Progress Notes (Addendum)
Progress Note    Amanda Reyes  FYB:[id number] DOB: December 12, 1985  DOA: 11/09/2019 PCP: Patient, No Pcp Per      Brief Narrative:    Medical records reviewed and are as summarized below:  Amanda Reyes is a 34 y.o. female       Assessment/Plan:   Active Problems:   Diverticulitis of colon with perforation   Diverticulitis of large intestine with perforation and abscess   Nutrition Problem: Inadequate oral intake Etiology: inability to eat  Signs/Symptoms: NPO status   Body mass index is 64.14 kg/m.  (Morbid obesity)  Acute hypoxic and hypercapnic respiratory failure Obesity hypoventilation syndrome Probable severe OSA Type II DM with hyperglycemia Hypoalbuminemia Perihepatic abscess Leukocytosis Sinus tachycardia-likely from underlying infection AKI-resolved   PLAN  S/p CT-guided 12 French right abdominal abscess drain placement on 11/27/2019.  Continue IV meropenem and IV fluconazole.  Recommend DVT prophylaxis with heparin or Lovenox.  Discussed with Dr. Hazle Quant, surgeon, via secure chat and he requested that I put patient on DVT prophylaxis.  Lovenox has been prescribed.  She is on TPN for nutrition  She appears to be tolerating room air at this time.  Continue CPAP at night and as needed during the day.  Follow-up with general surgeon.  Plan discussed with her mother at the bedside.    Diet Order            Diet NPO time specified Except for: Sips with Meds, Ice Chips  Diet effective now                    Medications:   . sodium chloride   Intravenous Once  . chlorhexidine  15 mL Mouth Rinse BID  . Chlorhexidine Gluconate Cloth  6 each Topical Q0600  . furosemide  40 mg Intravenous BID  . insulin aspart  0-20 Units Subcutaneous Q6H  . mouth rinse  15 mL Mouth Rinse q12n4p  . pantoprazole (PROTONIX) IV  40 mg Intravenous Q12H  . sodium chloride flush  10-40 mL Intracatheter Q12H  . sodium chloride flush  5 mL  Intracatheter Q8H  . sodium chloride flush  5 mL Intracatheter Q8H   Continuous Infusions: . sodium chloride 10 mL/hr at 11/27/19 1617  . sodium chloride    . sodium chloride Stopped (11/27/19 2207)  . fluconazole (DIFLUCAN) IV    . meropenem (MERREM) IV 1 g (11/28/19 1340)  . TPN ADULT (ION) 90 mL/hr at 11/28/19 0526  . TPN ADULT (ION)       Anti-infectives (From admission, onward)   Start     Dose/Rate Route Frequency Ordered Stop   11/28/19 1600  fluconazole (DIFLUCAN) IVPB 800 mg        800 mg 100 mL/hr over 240 Minutes Intravenous Every 24 hours 11/27/19 1642     11/20/19 1600  anidulafungin (ERAXIS) 100 mg in sodium chloride 0.9 % 100 mL IVPB  Status:  Discontinued        100 mg 78 mL/hr over 100 Minutes Intravenous Every 24 hours 11/20/19 1227 11/27/19 1642   11/19/19 1400  anidulafungin (ERAXIS) 200 mg in sodium chloride 0.9 % 200 mL IVPB       Note to Pharmacy: Please place order for  tomorrow's dose   200 mg 78 mL/hr over 200 Minutes Intravenous  Once 11/19/19 1311 11/19/19 1714   11/19/19 1400  meropenem (MERREM) 1 g in sodium chloride 0.9 % 100 mL IVPB  1 g 200 mL/hr over 30 Minutes Intravenous Every 8 hours 11/19/19 1320     11/09/19 2200  metroNIDAZOLE (FLAGYL) IVPB 500 mg  Status:  Discontinued       "And" Linked Group Details   500 mg 100 mL/hr over 60 Minutes Intravenous Every 8 hours 11/09/19 1810 11/19/19 1311   11/09/19 2000  ciprofloxacin (CIPRO) IVPB 400 mg  Status:  Discontinued       "And" Linked Group Details   400 mg 200 mL/hr over 60 Minutes Intravenous Every 12 hours 11/09/19 1810 11/19/19 1311   11/09/19 1815  metroNIDAZOLE (FLAGYL) IVPB 500 mg        500 mg 100 mL/hr over 60 Minutes Intravenous  Once 11/09/19 1800 11/09/19 2317   11/09/19 1815  ciprofloxacin (CIPRO) IVPB 400 mg        400 mg 200 mL/hr over 60 Minutes Intravenous  Once 11/09/19 1800 11/09/19 1946             Family Communication/Anticipated D/C date and plan/Code  Status   DVT prophylaxis: SCD's Start: 11/23/19 1508 Place and maintain sequential compression device Start: 11/17/19 1017     Code Status: Full Code  Family Communication: Plan discussed with her mother at the bedside Disposition Plan: Per surgeon           Subjective:   C/o severe abdominal pain  Objective:    Vitals:   11/28/19 0438 11/28/19 0440 11/28/19 0842 11/28/19 1136  BP: (!) 152/103 (!) 155/101 (!) 145/102 (!) 136/93  Pulse: 96 97  (!) 102  Resp: (!) 22 (!) 22  (!) 24  Temp: 98.5 F (36.9 C) 98.5 F (36.9 C)  98.1 F (36.7 C)  TempSrc: Oral Oral  Oral  SpO2: 97% 97%  99%  Weight:      Height:       No data found.   Intake/Output Summary (Last 24 hours) at 11/28/2019 1352 Last data filed at 11/28/2019 1108 Gross per 24 hour  Intake 1563.96 ml  Output 3230 ml  Net -1666.04 ml   Filed Weights   11/18/19 1237 11/26/19 1513 11/26/19 1541  Weight: (!) 211 kg (!) 196.5 kg (!) 197 kg    Exam:   GEN: NAD SKIN: Dry EYES: Anicteric ENT: MMM CV: RRR PULM: CTA B ABD: soft, obese, right-sided abdominal tenderness, no rebound tenderness or guarding,+ JP drain in right lower quadrant with purulent fluid CNS: Drowsy but arousable (had received IV Dilaudid for pain) EXT: Edema is improving.  No erythema or tenderness. GU: Foley catheter with amber urine     Data Reviewed:   I have personally reviewed following labs and imaging studies:  Labs: Labs show the following:   Basic Metabolic Panel: Recent Labs  Lab 11/23/19 0543 11/23/19 0543 11/24/19 1610 11/24/19 9604 11/25/19 0501 11/25/19 0501 11/26/19 5409 11/26/19 0642 11/27/19 0638 11/28/19 0935  NA 145   < > 140  --  139  --  139  --  137 135  K 4.4   < > 4.7   < > 4.9   < > 4.5   < > 4.6 4.0  CL 107   < > 100  --  98  --  94*  --  93* 90*  CO2 32   < > 35*  --  35*  --  37*  --  37* 38*  GLUCOSE 212*   < > 229*  --  163*  --  180*  --  185*  197*  BUN 13   < > 13  --  16  --   16  --  16 17  CREATININE 0.58   < > 0.50  --  0.60  --  0.56  --  0.58 0.51  CALCIUM 7.3*   < > 7.5*  --  7.5*  --  7.7*  --  7.7* 7.6*  MG 1.8  --  1.9  --  1.8  --  2.0  --  2.0  --   PHOS 2.0*  --  2.1*  --  3.0  --  3.3  --  3.1  --    < > = values in this interval not displayed.   GFR Estimated Creatinine Clearance: 187.1 mL/min (by C-G formula based on SCr of 0.51 mg/dL). Liver Function Tests: Recent Labs  Lab 11/23/19 0543 11/25/19 0501 11/26/19 0642 11/27/19 0638  AST 22  --  24  --   ALT 12  --  20  --   ALKPHOS 62  --  83  --   BILITOT 0.4  --  0.4  --   PROT 6.5  --  7.5  --   ALBUMIN 1.5* 1.7* 1.6* 1.5*   No results for input(s): LIPASE, AMYLASE in the last 168 hours. No results for input(s): AMMONIA in the last 168 hours. Coagulation profile No results for input(s): INR, PROTIME in the last 168 hours.  CBC: Recent Labs  Lab 11/23/19 0543 11/24/19 0635 11/25/19 0501 11/26/19 1500  WBC 15.2* 15.8* 17.0* 17.1*  NEUTROABS 11.0*  --   --   --   HGB 7.0* 7.1* 7.5* 7.4*  HCT 22.6* 23.3* 24.4* 23.5*  MCV 78.7* 80.3 78.2* 77.6*  PLT 236 233 299 310   Cardiac Enzymes: No results for input(s): CKTOTAL, CKMB, CKMBINDEX, TROPONINI in the last 168 hours. BNP (last 3 results) No results for input(s): PROBNP in the last 8760 hours. CBG: Recent Labs  Lab 11/27/19 2335 11/28/19 0643 11/28/19 0808 11/28/19 1133 11/28/19 1337  GLUCAP 207* 194* 207* 201* 215*   D-Dimer: No results for input(s): DDIMER in the last 72 hours. Hgb A1c: No results for input(s): HGBA1C in the last 72 hours. Lipid Profile: No results for input(s): CHOL, HDL, LDLCALC, TRIG, CHOLHDL, LDLDIRECT in the last 72 hours. Thyroid function studies: No results for input(s): TSH, T4TOTAL, T3FREE, THYROIDAB in the last 72 hours.  Invalid input(s): FREET3 Anemia work up: No results for input(s): VITAMINB12, FOLATE, FERRITIN, TIBC, IRON, RETICCTPCT in the last 72 hours. Sepsis Labs: Recent  Labs  Lab 11/22/19 0443 11/23/19 0543 11/24/19 0635 11/25/19 0501 11/26/19 1500  PROCALCITON 1.98 1.11  --   --   --   WBC  --  15.2* 15.8* 17.0* 17.1*    Microbiology Recent Results (from the past 240 hour(s))  MRSA PCR Screening     Status: None   Collection Time: 11/18/19  6:36 PM   Specimen: Nasopharyngeal  Result Value Ref Range Status   MRSA by PCR NEGATIVE NEGATIVE Final    Comment:        The GeneXpert MRSA Assay (FDA approved for NASAL specimens only), is one component of a comprehensive MRSA colonization surveillance program. It is not intended to diagnose MRSA infection nor to guide or monitor treatment for MRSA infections. Performed at [facility], [address] [place], [place] [address]   Aerobic/Anaerobic Culture (surgical/deep wound)     Status: None (Preliminary result)   Collection Time: 11/27/19 12:48 PM  Specimen: Abscess  Result Value Ref Range Status   Specimen Description   Final    ABSCESS ABDOMEN Performed at [facility], [address] [place], [place] [address]    Special Requests   Final    Normal Performed at [facility], [address] [place], [place] [address]    Gram Stain   Final    MODERATE WBC PRESENT,BOTH PMN AND MONONUCLEAR MODERATE GRAM POSITIVE RODS    Culture   Final    CULTURE REINCUBATED FOR BETTER GROWTH Performed at [facility], [address] [place], [place] [address]    Report Status PENDING  Incomplete    Procedures and diagnostic studies:  CT IMAGE GUIDED DRAINAGE BY PERCUTANEOUS CATHETER  Result Date: 11/27/2019 INDICATION: Right abdominal abscess EXAM: CT-guided 12 French drainage right abdominal abscess MEDICATIONS: The patient is currently admitted to the hospital and receiving intravenous antibiotics. The antibiotics were administered within an appropriate time frame prior to the initiation of the procedure. ANESTHESIA/SEDATION: Fentanyl 1.0 mcg IV; Versed 50 mg IV  Moderate Sedation Time:  12 minutes The patient was continuously monitored during the procedure by the interventional radiology nurse under my direct supervision. COMPLICATIONS: None immediate. PROCEDURE: Informed written consent was obtained from the patient after a thorough discussion of the procedural risks, benefits and alternatives. All questions were addressed. Maximal Sterile Barrier Technique was utilized including caps, mask, sterile gowns, sterile gloves, sterile drape, hand hygiene and skin antiseptic. A timeout was performed prior to the initiation of the procedure. Previous imaging reviewed. Patient positioned right anterior oblique. Noncontrast localization CT performed. The right abdominal abscess was localized and marked. Under sterile conditions and local anesthesia, an 18 gauge 15 cm access needle was advanced into the abscess. Needle position confirmed with CT. Syringe aspiration yielded purulent fluid. Sample sent for culture. Guidewire inserted followed by tract dilatation to insert a 12 [place]French drain. Drain catheter position confirmed CT. 80 cc purulent fluid aspirated. Catheter secured with Prolene suture and connected to external suction bulb. Sterile dressing applied. No immediate complication. Patient tolerated the procedure well. IMPRESSION: Successful CT-guided 12 French right abdominal abscess drain placement Electronically Signed   By: Judie PetitM.  Shick M.D.   On: 11/27/2019 13:23               LOS: 19 days   Pervis Macintyre  [facility]   Pager on [email or web address]. If 7PM-7AM, please contact night-coverage at [email or web address]     11/28/2019, 1:52 PM

## 2019-11-28 NOTE — Consult Note (Signed)
PHARMACY - TOTAL PARENTERAL NUTRITION CONSULT NOTE   Indication: Prolonged ileus  Patient Measurements: Height: 5\' 9"  (175.3 cm) Weight: (!) 197 kg (434 lb 4.9 oz) IBW/kg (Calculated) : 66.2 TPN AdjBW (KG): 90.5 Body mass index is 64.14 kg/m.  Assessment: 34 year old female with PMHx of HTN admitted with concern for perforated diverticulitis with contained abscesses. Patient is POD # 10 from laparoscopic lysis of adhesions and drainage of intra-abdominal abscess. There was no evidence of perforation. Procedure complicated by hypoxic respiratory failure requiring intubation. Patient extubated 9/10. No longer requiring blood pressure support. Since the previous note she has been re-assessed for calories and nutrition goals.  Glucose / Insulin: A1C 5.7, BG range 180-207: 19 units SSI required (decreased).   Electrolytes: Cl trending down, CO2 trending up, K 4.6 > 4.0 Renal: SCr < 1, stable  (good UOP) LFTs / TGs: LFTs WNL at baseline, TG 69 at baseline, 103 on 9/13  Prealbumin / albumin: <5 / 1.5 Intake / Output; MIVF: UOP of 7.7 L on 9/12. Continue to monitor I&Os and needfor intermittent Lasix as tolerated to maintain euvolemia.  GI Imaging: 9/7 CT abdomen pelvis: colonic perforation related to diverticulitis with worsening fluid and gas containing abscesses is throughout the low abdomen and pelvis many with interloop and mesenteric involvement, also with multiple areas of pelvic fluid as well showing generalized inflammation tracking to the mid abdomen. Interval decompression of the abscess in the cul-de-sac following drain placement. Extensive body wall edema.  9/15 CT concerning for perihepatic fluid collection and possible abscess  Surgeries / Procedures:  9/8 Robotic assisted laparoscopic lysis of adhesions and drainage of intra-abdominal abscess  9/17 IR placement of drain in perihepatic fluid collection   Central access: 11/19/19 TPN start date: 11/19/19  Nutritional Goals (per  RD recommendation on 9/7 and 9/9 --calorie goal slightly decreased but overall the same): kCal: 2700 - 3000 kCal/day, Protein: >135 g/day, Fluid: > 2000 mL Goal TPN rate is 90 mL/hr (provides 140 g of protein and 2999 kcals per day)  Current Nutrition:  NPO   Plan:   Switch to concentrated amino acids in order to decrease volume of TPN over concerns for edema (9/17)  Continue adjusted TPN rate of 90 mL/hr at 1800 using concentrated amino acids  Nutritional components: 21% dextrose, 65 g/L amino acids, 41.5 g/L lipids, 2999 kCal  Electrolytes in TPN: 28mEq/L of Na, 47mEq/L of K (increased from 25 mEq/L), 82mEq/L of Ca, 16mEq/L of Mg, phosphorous 15 mmol/L, continue Cl:Ac ratio at max (currently 1 : 1.04)  Add standard MVI and trace elements to TPN  Adjust frequency of resistant SSI to q6h SSI and continue to adjust as needed - continue insulin to 50 units insulin regular in TPN  Fluid amount lowered to 4m  Monitor TPN labs daily until stable then on Mon/Thurs, next in am 9/19  10/19 11/28/2019 7:33 AM

## 2019-11-28 NOTE — Progress Notes (Signed)
SURGICAL PROGRESS NOTE   Hospital Day(s): 19.   Post op day(s): 10 Days Post-Op.   Interval History: Patient seen and examined, no acute events or new complaints overnight. Patient reports feeling well.  She denies any flank pain.  She denies nausea or vomiting.  She reported passing flatus.  Vital signs in last 24 hours: [min-max] current  Temp:  [98.3 F (36.8 C)-98.6 F (37 C)] 98.5 F (36.9 C) (09/18 0440) Pulse Rate:  [96-128] 97 (09/18 0440) Resp:  [15-37] 22 (09/18 0440) BP: (131-156)/(81-121) 145/102 (09/18 0842) SpO2:  [93 %-100 %] 97 % (09/18 0440)     Height: 5\' 9"  (175.3 cm) Weight: (!) 197 kg BMI (Calculated): 64.11   Physical Exam:  Constitutional: alert, cooperative and no distress  Respiratory: breathing non-labored at rest  Cardiovascular: regular rate and sinus rhythm  Gastrointestinal: soft, non-tender, and non-distended.  Wounds and dry and clean.  Drain with purulent output  Labs:  CBC Latest Ref Rng & Units 11/26/2019 11/25/2019 11/24/2019  WBC 4.0 - 10.5 K/uL 17.1(H) 17.0(H) 15.8(H)  Hemoglobin 12.0 - 15.0 g/dL 7.4(L) 7.5(L) 7.1(L)  Hematocrit 36 - 46 % 23.5(L) 24.4(L) 23.3(L)  Platelets 150 - 400 K/uL 310 299 233   CMP Latest Ref Rng & Units 11/27/2019 11/26/2019 11/25/2019  Glucose 70 - 99 mg/dL 11/27/2019) 585(I) 778(E)  BUN 6 - 20 mg/dL 16 16 16   Creatinine 0.44 - 1.00 mg/dL 423(N 3.61  Sodium 135 - 145 mmol/L 137 139 139  Potassium 3.5 - 5.1 mmol/L 4.6 4.5 4.9  Chloride 98 - 111 mmol/L 93(L) 94(L) 98  CO2 22 - 32 mmol/L 37(H) 37(H) 35(H)  Calcium 8.9 - 10.3 mg/dL 7.7(L) 7.7(L) 7.5(L)  Total Protein 6.5 - 8.1 g/dL - 7.5 -  Total Bilirubin 0.3 - 1.2 mg/dL - 0.4 -  Alkaline Phos 38 - 126 U/L - 83 -  AST 15 - 41 U/L - 24 -  ALT 0 - 44 U/L - 20 -    Imaging studies: No new pertinent imaging studies   Assessment/Plan:  This is a 34 year old female with complicated diverticulitis with abscess s/p failed percutaneous drainage, s/p robotic assisted  exploratory laparoscopy with multiple drainage of intra-abdominal abscess postop day #10, complicated with morbid obesity.  Patient without any clinical deterioration.  There has been no fever in the last 24 hours.  White blood cells from 2 days ago stable at 17,000.  At this moment we will continue with IV antibiotic therapy, n.p.o., TPN, DVT prophylaxis.  Encourage the patient to ambulate.  1.54, MD

## 2019-11-28 NOTE — Progress Notes (Signed)
Physical Therapy Treatment Patient Details Name: Amanda Reyes MRN: [id number] DOB: [birth year] Today's Date: 11/28/2019    History of Present Illness Pt is 34 year old female with past medical history for morbid obesity and hypertension, admitted to ICU due to acute diverticulitis and microperforation.  Patient is s/p of robotic surgery for lysis of adhesions.    PT Comments    Pt was supine in bed upon arriving. Did coordinate meds prior to session. Pt agrees to PT session with pt's mother present throughout. She needs encouragement but was willing to attempt sitting up EOB. Rest BP 136/87 with HR 108 bpm. She was able to roll R with max assist of one, then from side lying to short sit EOB required +2 max assist for safety. Sat EOB x several minutes until pt requested to return to supine. Pt does have some anxiety upon sitting up with work of breathing/SOB noted. HR elevated to 130s. Overall tolerated well. Will benefit from increased upright positioning and increased exercises performed in bed. Lengthy education of importance of wt shift and repositioning for prevention of skin breakdown. Overall pt did well but will need extensive PT going forward. Recommend SNF at DC to address deficits and improve safe functional mobility while assisting pt to PLOF.      Follow Up Recommendations  SNF     Equipment Recommendations  Other (comment) (defer to next level of care)    Recommendations for Other Services       Precautions / Restrictions Restrictions Weight Bearing Restrictions: No    Mobility  Bed Mobility Overal bed mobility: Needs Assistance Bed Mobility: Rolling;Sidelying to Sit;Sit to Sidelying;Sit to Supine Rolling: +2 for safety/equipment;Max assist Sidelying to sit: +2 for safety/equipment;+2 for physical assistance;Max assist;Total assist   Sit to supine: Max assist;Total assist;+2 for physical assistance;+2 for safety/equipment Sit to sidelying: Max assist;+2 for  safety/equipment General bed mobility comments: With bed max deflated, pt was able to roll R with increased time and max assist of one. used bed rail to maintaining side lying. +2 max assist to achieve EOB short sit. Sat EOB x ~ 3 minutes prior to requesting to lay back down. Max assiist +2 to return to supine and reposition to [facility]. Pt sitting EOB limited by anxiety and with SOB present. HR does elevate to 130s but quickly resolves when return to supine.   Transfers          General transfer comment: unable/ unsafe to trial           Cognition Arousal/Alertness: Awake/alert Behavior During Therapy: WFL for tasks assessed/performed Overall Cognitive Status: Within Functional Limits for tasks assessed        General Comments: Pt was A and O and cooperative with encouragement. Her mother was present during session and alot of education on importance of increasing physical activity in bed.      Exercises Other Exercises Other Exercises: reviewed importance of performing ther ex throughout the day to maintain current strength        Pertinent Vitals/Pain Pain Assessment: No/denies pain           PT Goals (current goals can now be found in the care plan section) Acute Rehab PT Goals Patient Stated Goal: Go home and be able to swim again Progress towards PT goals: Progressing toward goals    Frequency    Min 2X/week      PT Plan Current plan remains appropriate       AM-PAC PT "6 Clicks" Mobility  Outcome Measure  Help needed turning from your back to your side while in a flat bed without using bedrails?: A Lot Help needed moving from lying on your back to sitting on the side of a flat bed without using bedrails?: Total Help needed moving to and from a bed to a chair (including a wheelchair)?: Total Help needed standing up from a chair using your arms (e.g., wheelchair or bedside chair)?: Total Help needed to walk in hospital room?: Total Help needed climbing 3-5  steps with a railing? : Total 6 Click Score: 7    End of Session Equipment Utilized During Treatment: Gait belt;Oxygen Activity Tolerance: Patient tolerated treatment well;Patient limited by fatigue Patient left: in bed;with call bell/phone within reach;with bed alarm set;with family/visitor present Nurse Communication: Mobility status PT Visit Diagnosis: Repeated falls (R29.6);Muscle weakness (generalized) (M62.81);Difficulty in walking, not elsewhere classified (R26.2)     Time: 0388-8280 PT Time Calculation (min) (ACUTE ONLY): 27 min  Charges:  $Therapeutic Exercise: 8-22 mins $Therapeutic Activity: 8-22 mins                     Jetta Lout PTA 11/28/19, 2:51 PM

## 2019-11-29 DIAGNOSIS — K5720 Diverticulitis of large intestine with perforation and abscess without bleeding: Secondary | ICD-10-CM | POA: Diagnosis not present

## 2019-11-29 LAB — GLUCOSE, CAPILLARY
Glucose-Capillary: 160 mg/dL — ABNORMAL HIGH (ref 70–99)
Glucose-Capillary: 171 mg/dL — ABNORMAL HIGH (ref 70–99)
Glucose-Capillary: 181 mg/dL — ABNORMAL HIGH (ref 70–99)
Glucose-Capillary: 193 mg/dL — ABNORMAL HIGH (ref 70–99)
Glucose-Capillary: 193 mg/dL — ABNORMAL HIGH (ref 70–99)
Glucose-Capillary: 202 mg/dL — ABNORMAL HIGH (ref 70–99)

## 2019-11-29 LAB — BASIC METABOLIC PANEL
Anion gap: 6 (ref 5–15)
BUN: 15 mg/dL (ref 6–20)
CO2: 35 mmol/L — ABNORMAL HIGH (ref 22–32)
Calcium: 7.3 mg/dL — ABNORMAL LOW (ref 8.9–10.3)
Chloride: 96 mmol/L — ABNORMAL LOW (ref 98–111)
Creatinine, Ser: 0.56 mg/dL (ref 0.44–1.00)
GFR calc Af Amer: >60 mL/min (ref >60)
GFR calc non Af Amer: >60 mL/min (ref >60)
Glucose, Bld: 178 mg/dL — ABNORMAL HIGH (ref 70–99)
Potassium: 4.3 mmol/L (ref 3.5–5.1)
Sodium: 137 mmol/L (ref 135–145)

## 2019-11-29 MED ORDER — TRACE MINERALS CU-MN-SE-ZN 300-55-60-3000 MCG/ML IV SOLN
INTRAVENOUS | Status: AC
  Filled 2019-11-29: qty 936

## 2019-11-29 NOTE — Progress Notes (Signed)
Progress Note    KYLINA VULTAGGIO  IRC:[id number] DOB: 1986/01/27  DOA: 11/09/2019 PCP: Patient, No Pcp Per      Brief Narrative:    Medical records reviewed and are as summarized below:  VENETA SLITER is a 34 y.o. female       Assessment/Plan:   Active Problems:   Diverticulitis of colon with perforation   Diverticulitis of large intestine with perforation and abscess   Nutrition Problem: Inadequate oral intake Etiology: inability to eat  Signs/Symptoms: NPO status   Body mass index is 64.46 kg/m.  (Morbid obesity)  Acute hypoxic and hypercapnic respiratory failure Obesity hypoventilation syndrome Probable severe OSA Type II DM with hyperglycemia Hypoalbuminemia Perihepatic abscess Leukocytosis Sinus tachycardia-likely from underlying infection AKI-resolved   PLAN  S/p CT-guided 12 French right abdominal abscess drain placement on 11/27/2019.  Continue IV meropenem and IV fluconazole  She remains on TPN for nutrition  Continue CPAP at night and as needed during the day.  Continue oxygen via nasal cannula as needed for hypoxia  Follow-up with general surgeon  Plan discussed with mother at the bedside    Diet Order            Diet NPO time specified Except for: Sips with Meds, Ice Chips  Diet effective now                    Medications:   . sodium chloride   Intravenous Once  . chlorhexidine  15 mL Mouth Rinse BID  . Chlorhexidine Gluconate Cloth  6 each Topical Q0600  . enoxaparin (LOVENOX) injection  40 mg Subcutaneous Q12H  . furosemide  40 mg Intravenous BID  . insulin aspart  0-20 Units Subcutaneous Q6H  . mouth rinse  15 mL Mouth Rinse q12n4p  . pantoprazole (PROTONIX) IV  40 mg Intravenous Q12H  . sodium chloride flush  10-40 mL Intracatheter Q12H  . sodium chloride flush  5 mL Intracatheter Q8H  . sodium chloride flush  5 mL Intracatheter Q8H   Continuous Infusions: . sodium chloride 10 mL/hr at 11/27/19 1617  .  sodium chloride    . sodium chloride Stopped (11/28/19 1707)  . fluconazole (DIFLUCAN) IV 800 mg (11/28/19 1703)  . meropenem (MERREM) IV 1 g (11/29/19 0538)  . TPN ADULT (ION) 90 mL/hr at 11/28/19 1839     Anti-infectives (From admission, onward)   Start     Dose/Rate Route Frequency Ordered Stop   11/28/19 1600  fluconazole (DIFLUCAN) IVPB 800 mg        800 mg 100 mL/hr over 240 Minutes Intravenous Every 24 hours 11/27/19 1642     11/20/19 1600  anidulafungin (ERAXIS) 100 mg in sodium chloride 0.9 % 100 mL IVPB  Status:  Discontinued        100 mg 78 mL/hr over 100 Minutes Intravenous Every 24 hours 11/20/19 1227 11/27/19 1642   11/19/19 1400  anidulafungin (ERAXIS) 200 mg in sodium chloride 0.9 % 200 mL IVPB       Note to Pharmacy: Please place order for  tomorrow's dose   200 mg 78 mL/hr over 200 Minutes Intravenous  Once 11/19/19 1311 11/19/19 1714   11/19/19 1400  meropenem (MERREM) 1 g in sodium chloride 0.9 % 100 mL IVPB        1 g 200 mL/hr over 30 Minutes Intravenous Every 8 hours 11/19/19 1320     11/09/19 2200  metroNIDAZOLE (FLAGYL) IVPB 500 mg  Status:  Discontinued       "And" Linked Group Details   500 mg 100 mL/hr over 60 Minutes Intravenous Every 8 hours 11/09/19 1810 11/19/19 1311   11/09/19 2000  ciprofloxacin (CIPRO) IVPB 400 mg  Status:  Discontinued       "And" Linked Group Details   400 mg 200 mL/hr over 60 Minutes Intravenous Every 12 hours 11/09/19 1810 11/19/19 1311   11/09/19 1815  metroNIDAZOLE (FLAGYL) IVPB 500 mg        500 mg 100 mL/hr over 60 Minutes Intravenous  Once 11/09/19 1800 11/09/19 2317   11/09/19 1815  ciprofloxacin (CIPRO) IVPB 400 mg        400 mg 200 mL/hr over 60 Minutes Intravenous  Once 11/09/19 1800 11/09/19 1946             Family Communication/Anticipated D/C date and plan/Code Status   DVT prophylaxis: enoxaparin (LOVENOX) injection 40 mg Start: 11/29/19 0900 SCD's Start: 11/23/19 1508 Place and maintain  sequential compression device Start: 11/17/19 1017     Code Status: Full Code  Family Communication: Plan discussed with her mother at the bedside Disposition Plan: Per surgeon           Subjective:   C/o severe abdominal pain  Objective:    Vitals:   11/28/19 2123 11/28/19 2352 11/29/19 0500 11/29/19 0553  BP: (!) 147/103 (!) 147/98  (!) 151/87  Pulse: 100 98  (!) 108  Resp:      Temp: 98 F (36.7 C) 98.5 F (36.9 C)  100.2 F (37.9 C)  TempSrc: Oral Oral  Oral  SpO2: 100% 100%  98%  Weight:   (!) 198 kg   Height:       No data found.   Intake/Output Summary (Last 24 hours) at 11/29/2019 1046 Last data filed at 11/29/2019 0554 Gross per 24 hour  Intake 2407.7 ml  Output 4570 ml  Net -2162.3 ml   Filed Weights   11/26/19 1513 11/26/19 1541 11/29/19 0500  Weight: (!) 196.5 kg (!) 197 kg (!) 198 kg    Exam:  GEN: NAD SKIN: Warm and dry EYES: Pale but anicteric ENT: MMM CV: RRR PULM: CTA B ABD: soft, obese, right-sided abdominal tenderness, no rebound tenderness or guarding, +JP drain on the right side of the abdomen with purulent drainage CNS: AAO x 3, non focal EXT: Mild bilateral leg edema, no tenderness GU: Foley catheter with amber urine Rectal tube in place   Data Reviewed:   I have personally reviewed following labs and imaging studies:  Labs: Labs show the following:   Basic Metabolic Panel: Recent Labs  Lab 11/23/19 0543 11/23/19 0543 11/24/19 7408 11/24/19 1448 11/25/19 0501 11/25/19 0501 11/26/19 1856 11/26/19 3149 11/27/19 7026 11/27/19 3785 11/28/19 0935 11/29/19 0602  NA 145   < > 140   < > 139  --  139  --  137  --  135 137  K 4.4   < > 4.7   < > 4.9   < > 4.5   < > 4.6   < > 4.0 4.3  CL 107   < > 100   < > 98  --  94*  --  93*  --  90* 96*  CO2 32   < > 35*   < > 35*  --  37*  --  37*  --  38* 35*  GLUCOSE 212*   < > 229*   < > 163*  --  180*  --  185*  --  197* 178*  BUN 13   < > 13   < > 16  --  16  --  16  --   17 15  CREATININE 0.58   < > 0.50   < > 0.60  --  0.56  --  0.58  --  0.51 0.56  CALCIUM 7.3*   < > 7.5*   < > 7.5*  --  7.7*  --  7.7*  --  7.6* 7.3*  MG 1.8  --  1.9  --  1.8  --  2.0  --  2.0  --   --   --   PHOS 2.0*  --  2.1*  --  3.0  --  3.3  --  3.1  --   --   --    < > = values in this interval not displayed.   GFR Estimated Creatinine Clearance: 187.7 mL/min (by C-G formula based on SCr of 0.56 mg/dL). Liver Function Tests: Recent Labs  Lab 11/23/19 0543 11/25/19 0501 11/26/19 0642 11/27/19 0638  AST 22  --  24  --   ALT 12  --  20  --   ALKPHOS 62  --  83  --   BILITOT 0.4  --  0.4  --   PROT 6.5  --  7.5  --   ALBUMIN 1.5* 1.7* 1.6* 1.5*   No results for input(s): LIPASE, AMYLASE in the last 168 hours. No results for input(s): AMMONIA in the last 168 hours. Coagulation profile No results for input(s): INR, PROTIME in the last 168 hours.  CBC: Recent Labs  Lab 11/23/19 0543 11/24/19 0635 11/25/19 0501 11/26/19 1500  WBC 15.2* 15.8* 17.0* 17.1*  NEUTROABS 11.0*  --   --   --   HGB 7.0* 7.1* 7.5* 7.4*  HCT 22.6* 23.3* 24.4* 23.5*  MCV 78.7* 80.3 78.2* 77.6*  PLT 236 233 299 310   Cardiac Enzymes: No results for input(s): CKTOTAL, CKMB, CKMBINDEX, TROPONINI in the last 168 hours. BNP (last 3 results) No results for input(s): PROBNP in the last 8760 hours. CBG: Recent Labs  Lab 11/28/19 1639 11/28/19 1948 11/28/19 2353 11/29/19 0548 11/29/19 0803  GLUCAP 188* 167* 192* 193* 181*   D-Dimer: No results for input(s): DDIMER in the last 72 hours. Hgb A1c: No results for input(s): HGBA1C in the last 72 hours. Lipid Profile: No results for input(s): CHOL, HDL, LDLCALC, TRIG, CHOLHDL, LDLDIRECT in the last 72 hours. Thyroid function studies: No results for input(s): TSH, T4TOTAL, T3FREE, THYROIDAB in the last 72 hours.  Invalid input(s): FREET3 Anemia work up: No results for input(s): VITAMINB12, FOLATE, FERRITIN, TIBC, IRON, RETICCTPCT in the last  72 hours. Sepsis Labs: Recent Labs  Lab 11/23/19 0543 11/24/19 0635 11/25/19 0501 11/26/19 1500  PROCALCITON 1.11  --   --   --   WBC 15.2* 15.8* 17.0* 17.1*    Microbiology Recent Results (from the past 240 hour(s))  Aerobic/Anaerobic Culture (surgical/deep wound)     Status: None (Preliminary result)   Collection Time: 11/27/19 12:48 PM   Specimen: Abscess  Result Value Ref Range Status   Specimen Description   Final    ABSCESS ABDOMEN Performed at [facility], [address] [place], [place] [address]    Special Requests   Final    Normal Performed at [facility], [address] [place], [place] [address]    Gram Stain   Final  MODERATE WBC PRESENT,BOTH PMN AND MONONUCLEAR MODERATE GRAM POSITIVE RODS Performed at [facility], [address] [place], [place] [address]    Culture   Final    FEW LACTOBACILLUS SPECIES NO ANAEROBES ISOLATED; CULTURE IN PROGRESS FOR 5 DAYS    Report Status PENDING  Incomplete    Procedures and diagnostic studies:  CT IMAGE GUIDED DRAINAGE BY PERCUTANEOUS CATHETER  Result Date: 11/27/2019 INDICATION: Right abdominal abscess EXAM: CT-guided 12 French drainage right abdominal abscess MEDICATIONS: The patient is currently admitted to the hospital and receiving intravenous antibiotics. The antibiotics were administered within an appropriate time frame prior to the initiation of the procedure. ANESTHESIA/SEDATION: Fentanyl 1.0 mcg IV; Versed 50 mg IV Moderate Sedation Time:  12 minutes The patient was continuously monitored during the procedure by the interventional radiology nurse under my direct supervision. COMPLICATIONS: None immediate. PROCEDURE: Informed written consent was obtained from the patient after a thorough discussion of the procedural risks, benefits and alternatives. All questions were addressed. Maximal Sterile Barrier Technique was utilized including caps, mask, sterile gowns, sterile gloves, sterile  drape, hand hygiene and skin antiseptic. A timeout was performed prior to the initiation of the procedure. Previous imaging reviewed. Patient positioned right anterior oblique. Noncontrast localization CT performed. The right abdominal abscess was localized and marked. Under sterile conditions and local anesthesia, an 18 gauge 15 cm access needle was advanced into the abscess. Needle position confirmed with CT. Syringe aspiration yielded purulent fluid. Sample sent for culture. Guidewire inserted followed by tract dilatation to insert a 12 [place] drain. Drain catheter position confirmed CT. 80 cc purulent fluid aspirated. Catheter secured with Prolene suture and connected to external suction bulb. Sterile dressing applied. No immediate complication. Patient tolerated the procedure well. IMPRESSION: Successful CT-guided 12 French right abdominal abscess drain placement Electronically Signed   By: Judie PetitM.  Shick M.D.   On: 11/27/2019 13:23               LOS: 20 days   Zaylin Pistilli  [facility]   Pager on [email or web address]. If 7PM-7AM, please contact night-coverage at [email or web address]     11/29/2019, 10:46 AM

## 2019-11-29 NOTE — Plan of Care (Signed)
Continuing with plan of care. 

## 2019-11-29 NOTE — Consult Note (Signed)
PHARMACY - TOTAL PARENTERAL NUTRITION CONSULT NOTE   Indication: Prolonged ileus  Patient Measurements: Height: 5\' 9"  (175.3 cm) Weight: (!) 198 kg (436 lb 8.2 oz) IBW/kg (Calculated) : 66.2 TPN AdjBW (KG): 90.5 Body mass index is 64.46 kg/m.  Assessment: 34 year old female with PMHx of HTN admitted with concern for perforated diverticulitis with contained abscesses. Patient is POD # 11 from laparoscopic lysis of adhesions and drainage of intra-abdominal abscess. There was no evidence of perforation. Procedure complicated by hypoxic respiratory failure requiring intubation. Patient extubated 9/10. No longer requiring blood pressure support. Since the previous note she has been re-assessed for calories and nutrition goals.  Glucose / Insulin: A1C 5.7, BG range 167-215: 19 units SSI required.   Electrolytes: Cl trending down now up to 96, CO2 trending up now 38>35, K 4.6 > 4.0 >4.3 Renal: SCr < 1, stable  (good UOP) LFTs / TGs: LFTs WNL at baseline, TG 69 at baseline, 103 on 9/13  Prealbumin / albumin: <5 / 1.5 Intake / Output; MIVF: UOP of 7.7 L on 9/12. Continue to monitor I&Os and needfor intermittent Lasix as tolerated to maintain euvolemia.  GI Imaging: 9/7 CT abdomen pelvis: colonic perforation related to diverticulitis with worsening fluid and gas containing abscesses is throughout the low abdomen and pelvis many with interloop and mesenteric involvement, also with multiple areas of pelvic fluid as well showing generalized inflammation tracking to the mid abdomen. Interval decompression of the abscess in the cul-de-sac following drain placement. Extensive body wall edema.  9/15 CT concerning for perihepatic fluid collection and possible abscess  Surgeries / Procedures:  9/8 Robotic assisted laparoscopic lysis of adhesions and drainage of intra-abdominal abscess  9/17 IR placement of drain in perihepatic fluid collection   Central access: 11/19/19 TPN start date:  11/19/19  Nutritional Goals (per RD recommendation on 9/7 and 9/9 --calorie goal slightly decreased but overall the same): kCal: 2700 - 3000 kCal/day, Protein: >135 g/day, Fluid: > 2000 mL Goal TPN rate is 90 mL/hr (provides 140 g of protein and 2999 kcals per day)  Current Nutrition:  NPO   Plan:   Switch to concentrated amino acids in order to decrease volume of TPN over concerns for edema (on 9/17)  Continue adjusted TPN rate of 90 mL/hr at 1800 using concentrated amino acids  Nutritional components: 21% dextrose, 65 g/L amino acids, 41.5 g/L lipids, 2999 kCal  Electrolytes in TPN: 48mEq/L of Na, 84mEq/L of K, 60mEq/L of Ca, 34mEq/L of Mg, phosphorous 15 mmol/L, continue Cl:Ac ratio at max (currently 1 : 1.04)  Add standard MVI and trace elements to TPN  Adjust frequency of resistant SSI to q6h SSI and continue to adjust as needed - continue insulin to 50 units insulin regular in TPN  Fluid amount lowered to 4m  Monitor TPN labs daily until stable then on Mon/Thurs, next in am 9/20  10/20 11/29/2019 8:52 AM

## 2019-11-29 NOTE — Progress Notes (Signed)
SURGICAL PROGRESS NOTE   Hospital Day(s): 20.   Post op day(s): 11 Days Post-Op.   Interval History: Patient seen and examined, no acute events or new complaints overnight. Patient reports pain in the right lower quadrant.  Pain does not radiate to other part of the body.  Pain alleviated by pain medications.  Has been no aggravating factors identified.-No fever in the last 24 hours.  The heart rate has been stable low 100s.   Vital signs in last 24 hours: [min-max] current  Temp:  [98 F (36.7 C)-100.2 F (37.9 C)] 100.2 F (37.9 C) (09/19 0553) Pulse Rate:  [98-108] 108 (09/19 0553) Resp:  [24] 24 (09/18 1136) BP: (136-151)/(87-109) 151/87 (09/19 0553) SpO2:  [98 %-100 %] 98 % (09/19 0553) Weight:  [458 kg] 198 kg (09/19 0500)     Height: 5\' 9"  (175.3 cm) Weight: (!) 198 kg BMI (Calculated): 64.43   Physical Exam:  Constitutional: alert, cooperative and no distress  Respiratory: breathing non-labored at rest  Cardiovascular: regular rate and sinus rhythm  Gastrointestinal: soft, non-tender, and non-distended.  Drain with seropurulent output.  Labs:  CBC Latest Ref Rng & Units 11/26/2019 11/25/2019 11/24/2019  WBC 4.0 - 10.5 K/uL 17.1(H) 17.0(H) 15.8(H)  Hemoglobin 12.0 - 15.0 g/dL 7.4(L) 7.5(L) 7.1(L)  Hematocrit 36 - 46 % 23.5(L) 24.4(L) 23.3(L)  Platelets 150 - 400 K/uL 310 299 233   CMP Latest Ref Rng & Units 11/29/2019 11/28/2019 11/27/2019  Glucose 70 - 99 mg/dL 11/29/2019) 099(I) 338(S)  BUN 6 - 20 mg/dL 15 17 16   Creatinine 0.44 - 1.00 mg/dL 505(L 9.76  Sodium 135 - 145 mmol/L 137 135 137  Potassium 3.5 - 5.1 mmol/L 4.3 4.0 4.6  Chloride 98 - 111 mmol/L 96(L) 90(L) 93(L)  CO2 22 - 32 mmol/L 35(H) 38(H) 37(H)  Calcium 8.9 - 10.3 mg/dL 7.3(L) 7.6(L) 7.7(L)  Total Protein 6.5 - 8.1 g/dL - - -  Total Bilirubin 0.3 - 1.2 mg/dL - - -  Alkaline Phos 38 - 126 U/L - - -  AST 15 - 41 U/L - - -  ALT 0 - 44 U/L - - -    Imaging studies: No new pertinent imaging  studies   Assessment/Plan:  This is a 34 year old female with complicated diverticulitis with abscess s/p failed percutaneous drainage, s/p robotic assisted exploratory laparoscopy with multiple drainage of intra-abdominal abscess postop day #11, complicated with morbid obesity.  Patient without any clinical deterioration.  Continue without fever.  Heart rate better than before surgery now in the low 100s.  Patient needs to continue IV antibiotic therapy.  We will continue n.p.o. with TPN.  We will continue with current pain management.  No need for intervention at this moment.  We will follow up with new labs tomorrow morning.  1.93, MD

## 2019-11-30 DIAGNOSIS — K651 Peritoneal abscess: Secondary | ICD-10-CM | POA: Diagnosis not present

## 2019-11-30 DIAGNOSIS — K5720 Diverticulitis of large intestine with perforation and abscess without bleeding: Secondary | ICD-10-CM | POA: Diagnosis not present

## 2019-11-30 DIAGNOSIS — K5792 Diverticulitis of intestine, part unspecified, without perforation or abscess without bleeding: Secondary | ICD-10-CM | POA: Diagnosis not present

## 2019-11-30 LAB — COMPREHENSIVE METABOLIC PANEL
ALT: 29 U/L (ref 0–44)
AST: 24 U/L (ref 15–41)
Albumin: 1.4 g/dL — ABNORMAL LOW (ref 3.5–5.0)
Alkaline Phosphatase: 137 U/L — ABNORMAL HIGH (ref 38–126)
Anion gap: 7 (ref 5–15)
BUN: 15 mg/dL (ref 6–20)
CO2: 33 mmol/L — ABNORMAL HIGH (ref 22–32)
Calcium: 7.5 mg/dL — ABNORMAL LOW (ref 8.9–10.3)
Chloride: 95 mmol/L — ABNORMAL LOW (ref 98–111)
Creatinine, Ser: 0.54 mg/dL (ref 0.44–1.00)
GFR calc Af Amer: >60 mL/min (ref >60)
GFR calc non Af Amer: >60 mL/min (ref >60)
Glucose, Bld: 166 mg/dL — ABNORMAL HIGH (ref 70–99)
Potassium: 4.3 mmol/L (ref 3.5–5.1)
Sodium: 135 mmol/L (ref 135–145)
Total Bilirubin: 0.5 mg/dL (ref 0.3–1.2)
Total Protein: 7.5 g/dL (ref 6.5–8.1)

## 2019-11-30 LAB — GLUCOSE, CAPILLARY
Glucose-Capillary: 179 mg/dL — ABNORMAL HIGH (ref 70–99)
Glucose-Capillary: 160 mg/dL — ABNORMAL HIGH (ref 70–99)
Glucose-Capillary: 172 mg/dL — ABNORMAL HIGH (ref 70–99)
Glucose-Capillary: 177 mg/dL — ABNORMAL HIGH (ref 70–99)

## 2019-11-30 LAB — CBC WITH DIFFERENTIAL/PLATELET
Abs Immature Granulocytes: 0.34 10*3/uL — ABNORMAL HIGH (ref 0.00–0.07)
Basophils Absolute: 0.1 10*3/uL (ref 0.0–0.1)
Basophils Relative: 0 %
Eosinophils Absolute: 0.2 10*3/uL (ref 0.0–0.5)
Eosinophils Relative: 1 %
HCT: 22.1 % — ABNORMAL LOW (ref 36.0–46.0)
Hemoglobin: 7 g/dL — ABNORMAL LOW (ref 12.0–15.0)
Immature Granulocytes: 2 %
Lymphocytes Relative: 12 %
Lymphs Abs: 2 10*3/uL (ref 0.7–4.0)
MCH: 24 pg — ABNORMAL LOW (ref 26.0–34.0)
MCHC: 31.7 g/dL (ref 30.0–36.0)
MCV: 75.7 fL — ABNORMAL LOW (ref 80.0–100.0)
Monocytes Absolute: 1.3 10*3/uL — ABNORMAL HIGH (ref 0.1–1.0)
Monocytes Relative: 8 %
Neutro Abs: 12.4 10*3/uL — ABNORMAL HIGH (ref 1.7–7.7)
Neutrophils Relative %: 77 %
Platelets: 429 10*3/uL — ABNORMAL HIGH (ref 150–400)
RBC: 2.92 MIL/uL — ABNORMAL LOW (ref 3.87–5.11)
RDW: 19 % — ABNORMAL HIGH (ref 11.5–15.5)
WBC: 16.2 10*3/uL — ABNORMAL HIGH (ref 4.0–10.5)
nRBC: 0.2 % (ref 0.0–0.2)

## 2019-11-30 LAB — MAGNESIUM: Magnesium: 1.9 mg/dL (ref 1.7–2.4)

## 2019-11-30 LAB — PREALBUMIN: Prealbumin: 8.4 mg/dL — ABNORMAL LOW (ref 18–38)

## 2019-11-30 LAB — PHOSPHORUS: Phosphorus: 2.9 mg/dL (ref 2.5–4.6)

## 2019-11-30 LAB — TRIGLYCERIDES: Triglycerides: 193 mg/dL — ABNORMAL HIGH (ref <150)

## 2019-11-30 MED ORDER — BOOST / RESOURCE BREEZE PO LIQD CUSTOM
1.0000 | Freq: Three times a day (TID) | ORAL | Status: DC
  Administered 2019-11-30 – 2019-12-01 (×4): 1 via ORAL

## 2019-11-30 MED ORDER — TRACE MINERALS CU-MN-SE-ZN 300-55-60-3000 MCG/ML IV SOLN
INTRAVENOUS | Status: AC
  Filled 2019-11-30: qty 936

## 2019-11-30 MED ORDER — SODIUM CHLORIDE 0.9 % IV SOLN
1.0000 g | Freq: Three times a day (TID) | INTRAVENOUS | Status: AC
  Administered 2019-11-30 – 2019-12-14 (×42): 1 g via INTRAVENOUS
  Filled 2019-11-30 (×47): qty 1

## 2019-11-30 NOTE — Progress Notes (Signed)
Progress Note    Amanda Reyes  YWV:[id number] DOB: [birth year]  DOA: 11/09/2019 PCP: Patient, No Pcp Per      Brief Narrative:    Medical records reviewed and are as summarized below:  Amanda Reyes is a 34 y.o. female       Assessment/Plan:   Active Problems:   Diverticulitis of colon with perforation   Diverticulitis of large intestine with perforation and abscess   Nutrition Problem: Inadequate oral intake Etiology: inability to eat  Signs/Symptoms: NPO status   Body mass index is 64.46 kg/m.  (Morbid obesity)  Acute hypoxic and hypercapnic respiratory failure Obesity hypoventilation syndrome Probable severe OSA Type II DM with hyperglycemia Hypoalbuminemia Perihepatic abscess Leukocytosis Sinus tachycardia-likely from underlying infection AKI-resolved   PLAN  S/p CT-guided 12 French right abdominal abscess drain placement on 11/27/2019.  Continue IV meropenem and IV fluconazole  She still on TPN for nutrition  Continue CPAP at night and as needed during the day  Consider oxygen via nasal cannula as needed for hypoxia  Discontinue IV Lasix  Follow-up with general surgeon  Plan discussed with her mother at the bedside    Diet Order            Diet clear liquid Room service appropriate? Yes; Fluid consistency: Thin  Diet effective now                    Medications:   . sodium chloride   Intravenous Once  . chlorhexidine  15 mL Mouth Rinse BID  . Chlorhexidine Gluconate Cloth  6 each Topical Q0600  . enoxaparin (LOVENOX) injection  40 mg Subcutaneous Q12H  . feeding supplement  1 Container Oral TID BM  . insulin aspart  0-20 Units Subcutaneous Q6H  . mouth rinse  15 mL Mouth Rinse q12n4p  . pantoprazole (PROTONIX) IV  40 mg Intravenous Q12H  . sodium chloride flush  10-40 mL Intracatheter Q12H  . sodium chloride flush  5 mL Intracatheter Q8H  . sodium chloride flush  5 mL Intracatheter Q8H   Continuous Infusions: .  sodium chloride 10 mL/hr at 11/27/19 1617  . sodium chloride    . sodium chloride Stopped (11/28/19 1707)  . fluconazole (DIFLUCAN) IV Stopped (11/29/19 2000)  . meropenem (MERREM) IV 1 g (11/30/19 0546)  . TPN ADULT (ION) 90 mL/hr at 11/30/19 0040  . TPN ADULT (ION)       Anti-infectives (From admission, onward)   Start     Dose/Rate Route Frequency Ordered Stop   11/28/19 1600  fluconazole (DIFLUCAN) IVPB 800 mg        800 mg 100 mL/hr over 240 Minutes Intravenous Every 24 hours 11/27/19 1642     11/20/19 1600  anidulafungin (ERAXIS) 100 mg in sodium chloride 0.9 % 100 mL IVPB  Status:  Discontinued        100 mg 78 mL/hr over 100 Minutes Intravenous Every 24 hours 11/20/19 1227 11/27/19 1642   11/19/19 1400  anidulafungin (ERAXIS) 200 mg in sodium chloride 0.9 % 200 mL IVPB       Note to Pharmacy: Please place order for  tomorrow's dose   200 mg 78 mL/hr over 200 Minutes Intravenous  Once 11/19/19 1311 11/19/19 1714   11/19/19 1400  meropenem (MERREM) 1 g in sodium chloride 0.9 % 100 mL IVPB        1 g 200 mL/hr over 30 Minutes Intravenous Every 8 hours 11/19/19 1320  11/09/19 2200  metroNIDAZOLE (FLAGYL) IVPB 500 mg  Status:  Discontinued       "And" Linked Group Details   500 mg 100 mL/hr over 60 Minutes Intravenous Every 8 hours 11/09/19 1810 11/19/19 1311   11/09/19 2000  ciprofloxacin (CIPRO) IVPB 400 mg  Status:  Discontinued       "And" Linked Group Details   400 mg 200 mL/hr over 60 Minutes Intravenous Every 12 hours 11/09/19 1810 11/19/19 1311   11/09/19 1815  metroNIDAZOLE (FLAGYL) IVPB 500 mg        500 mg 100 mL/hr over 60 Minutes Intravenous  Once 11/09/19 1800 11/09/19 2317   11/09/19 1815  ciprofloxacin (CIPRO) IVPB 400 mg        400 mg 200 mL/hr over 60 Minutes Intravenous  Once 11/09/19 1800 11/09/19 1946             Family Communication/Anticipated D/C date and plan/Code Status   DVT prophylaxis: enoxaparin (LOVENOX) injection 40 mg Start:  11/29/19 0900 SCD's Start: 11/23/19 1508 Place and maintain sequential compression device Start: 11/17/19 1017     Code Status: Full Code  Family Communication: Plan discussed with her mother at the bedside Disposition Plan: Per surgeon           Subjective:   Abdominal pain is not too bad today.  No other complaints.  No shortness of breath or chest pain.  Objective:    Vitals:   11/30/19 0730 11/30/19 0829 11/30/19 0930 11/30/19 1130  BP: (!) 151/99  (!) 144/89 133/80  Pulse: (!) 108  (!) 106 (!) 122  Resp: (!) 22  (!) 23 (!) 22  Temp: 98.7 F (37.1 C)  99.2 F (37.3 C) 98.6 F (37 C)  TempSrc:   Oral Oral  SpO2: 100% 99% 98% 99%  Weight:      Height:       No data found.   Intake/Output Summary (Last 24 hours) at 11/30/2019 1240 Last data filed at 11/30/2019 1150 Gross per 24 hour  Intake 1907.19 ml  Output 6310 ml  Net -4402.81 ml   Filed Weights   11/26/19 1541 11/29/19 0500 11/30/19 0500  Weight: (!) 197 kg (!) 198 kg (!) 198 kg    Exam:  GEN: No acute distress SKIN: Warm and dry EYES: Pale, anicteric ENT: Moist mucous membranes CV: Regular rate, tachycardic PULM: No wheezing or rales heard ABD: soft, obese, right-sided abdominal tenderness, + 3 JP drains in the abdomen  CNS: Sleepy but arousable.  No focal deficits. EXT: No edema or tenderness GU: Foley catheter draining amber urine Rectal tube in place   Data Reviewed:   I have personally reviewed following labs and imaging studies:  Labs: Labs show the following:   Basic Metabolic Panel: Recent Labs  Lab 11/24/19 0635 11/24/19 0635 11/25/19 0501 11/25/19 0501 11/26/19 [id number] 11/26/19 [id number] 11/27/19 [id number] 11/27/19 [id number] 11/28/19 0935 11/28/19 0935 11/29/19 0602 11/30/19 0354  NA 140   < > 139   < > 139  --  137  --  135  --  137 135  K 4.7   < > 4.9   < > 4.5   < > 4.6   < > 4.0   < > 4.3 4.3  CL 100   < > 98   < > 94*  --  93*  --  90*  --  96* 95*  CO2 35*   < > 35*   <  > 37*  --  37*  --  38*  --  35* 33*  GLUCOSE 229*   < > 163*   < > 180*  --  185*  --  197*  --  178* 166*  BUN 13   < > 16   < > 16  --  16  --  17  --  15 15  CREATININE 0.50   < > 0.60   < > 0.56  --  0.58  --  0.51  --  0.56 0.54  CALCIUM 7.5*   < > 7.5*   < > 7.7*  --  7.7*  --  7.6*  --  7.3* 7.5*  MG 1.9  --  1.8  --  2.0  --  2.0  --   --   --   --  1.9  PHOS 2.1*  --  3.0  --  3.3  --  3.1  --   --   --   --  2.9   < > = values in this interval not displayed.   GFR Estimated Creatinine Clearance: 187.7 mL/min (by C-G formula based on SCr of 0.54 mg/dL). Liver Function Tests: Recent Labs  Lab 11/25/19 0501 11/26/19 0642 11/27/19 0638 11/30/19 0354  AST  --  24  --  24  ALT  --  20  --  29  ALKPHOS  --  83  --  137*  BILITOT  --  0.4  --  0.5  PROT  --  7.5  --  7.5  ALBUMIN 1.7* 1.6* 1.5* 1.4*   No results for input(s): LIPASE, AMYLASE in the last 168 hours. No results for input(s): AMMONIA in the last 168 hours. Coagulation profile No results for input(s): INR, PROTIME in the last 168 hours.  CBC: Recent Labs  Lab 11/24/19 0635 11/25/19 0501 11/26/19 1500 11/30/19 0354  WBC 15.8* 17.0* 17.1* 16.2*  NEUTROABS  --   --   --  12.4*  HGB 7.1* 7.5* 7.4* 7.0*  HCT 23.3* 24.4* 23.5* 22.1*  MCV 80.3 78.2* 77.6* 75.7*  PLT 233 299 310 429*   Cardiac Enzymes: No results for input(s): CKTOTAL, CKMB, CKMBINDEX, TROPONINI in the last 168 hours. BNP (last 3 results) No results for input(s): PROBNP in the last 8760 hours. CBG: Recent Labs  Lab 11/29/19 1726 11/29/19 1953 11/29/19 2345 11/30/19 0724 11/30/19 1153  GLUCAP 193* 160* 171* 179* 160*   D-Dimer: No results for input(s): DDIMER in the last 72 hours. Hgb A1c: No results for input(s): HGBA1C in the last 72 hours. Lipid Profile: Recent Labs    11/30/19 0354  TRIG 193*   Thyroid function studies: No results for input(s): TSH, T4TOTAL, T3FREE, THYROIDAB in the last 72 hours.  Invalid input(s):  FREET3 Anemia work up: No results for input(s): VITAMINB12, FOLATE, FERRITIN, TIBC, IRON, RETICCTPCT in the last 72 hours. Sepsis Labs: Recent Labs  Lab 11/24/19 0635 11/25/19 0501 11/26/19 1500 11/30/19 0354  WBC 15.8* 17.0* 17.1* 16.2*    Microbiology Recent Results (from the past 240 hour(s))  Aerobic/Anaerobic Culture (surgical/deep wound)     Status: None (Preliminary result)   Collection Time: 11/27/19 12:48 PM   Specimen: Abscess  Result Value Ref Range Status   Specimen Description   Final    ABSCESS ABDOMEN Performed at [facility], [address], [place], [place] [address]    Special Requests   Final    Normal Performed at [facility], [address] [place],  [place] [address]    Gram Stain   Final    MODERATE WBC PRESENT,BOTH PMN AND MONONUCLEAR MODERATE GRAM POSITIVE RODS Performed at [facility], [address] [place], [place] [address]    Culture   Final    FEW LACTOBACILLUS SPECIES NO ANAEROBES ISOLATED; CULTURE IN PROGRESS FOR 5 DAYS    Report Status PENDING  Incomplete    Procedures and diagnostic studies:  No results found.             LOS: 21 days   Lasharon Dunivan  [employer] Chartered loss adjuster on [email or web address]. If 7PM-7AM, please contact night-coverage at [email or web address]     11/30/2019, 12:40 PM

## 2019-11-30 NOTE — Progress Notes (Signed)
ID  Pt in bed Says she is feeling a bit better Has three drains in her abdomen Patient Vitals for the past 24 hrs:  BP Temp Temp src Pulse Resp SpO2 Weight  11/30/19 1130 133/80 98.6 F (37 C) Oral (!) 122 (!) 22 99 % --  11/30/19 0930 (!) 144/89 99.2 F (37.3 C) Oral (!) 106 (!) 23 98 % --  11/30/19 0829 -- -- -- -- -- 99 % --  11/30/19 0730 (!) 151/99 98.7 F (37.1 C) -- (!) 108 (!) 22 100 % --  11/30/19 0500 -- -- -- -- -- -- (!) 198 kg  11/29/19 1958 -- -- -- (!) 113 20 -- --  11/29/19 1956 (!) 158/90 99.4 F (37.4 C) Oral (!) 115 -- 100 % --    Chest b/l air entry [id number] Abd soft Morbid obesity anasarca    Labs CBC Latest Ref Rng & Units 11/30/2019 11/26/2019 11/25/2019  WBC 4.0 - 10.5 K/uL 16.2(H) 17.1(H) 17.0(H)  Hemoglobin 12.0 - 15.0 g/dL 7.0(L) 7.4(L) 7.5(L)  Hematocrit 36 - 46 % 22.1(L) 23.5(L) 24.4(L)  Platelets 150 - 400 K/uL 429(H) 310 299    CMP Latest Ref Rng & Units 11/30/2019 11/29/2019 11/28/2019  Glucose 70 - 99 mg/dL 245(Y) 099(I) 338(S)  BUN 6 - 20 mg/dL 15 15 17   Creatinine 0.44 - 1.00 mg/dL 5.05 3.97  Sodium 135 - 145 mmol/L 135 137 135  Potassium 3.5 - 5.1 mmol/L 4.3 4.3 4.0  Chloride 98 - 111 mmol/L 95(L) 96(L) 90(L)  CO2 22 - 32 mmol/L 33(H) 35(H) 38(H)  Calcium 8.9 - 10.3 mg/dL 7.5(L) 7.3(L) 7.6(L)  Total Protein 6.5 - 8.1 g/dL 7.5 - -  Total Bilirubin 0.3 - 1.2 mg/dL 0.5 - -  Alkaline Phos 38 - 126 U/L 137(H) - -  AST 15 - 41 U/L 24 - -  ALT 0 - 44 U/L 29 - -    Impression/recommendation  Perforated diverticulitis with extensive intra-abdominal loop abscesses Status post laparoscopy and washout.on 11/18/19 Because of extensive abscessesshe is currently on meropenem anidulafungin. CT abdomen done9/16shows an abscess along rtlateral margin liverand organizing abscesses central abdominal mesentry - . IR placed another drain  9/17. Culture lactobacillus- meropenem is the rx as she has amoxicillin allergy Continue  fluconazole Discussed with patient and mother.

## 2019-11-30 NOTE — Progress Notes (Addendum)
   11/30/19 1300  Clinical Encounter Type  Visited With Patient and family together;Health care provider  Visit Type Follow-up  Referral From Chaplain  Consult/Referral To Chaplain  Chaplain saw pt's mother in the hall around 11:45 and she was unhappy that her daughter's disability forms have not been completed. Chaplain told pt's mother that she would see what is being done. Chaplain briefly spoke to Interior and spatial designer, Calton Dach regarding this family and she was award of what has been going. Sherea told chaplain that secretary was working on getting forms completed.  Later in the afternoon, the secretary told pt and mother she faxed paperwork to the doctor's office and they should be completed by Friday. Mother was happy for this news.  Later during the da

## 2019-11-30 NOTE — Progress Notes (Signed)
Referring Physician(s): Dr. Richarda Blade   Supervising Physician: Ruel Favors  Patient Status:  [facility] - In-pt  Chief Complaint:  Intra abdominal abscesses s/p IR right inferior drain pm 9.3.21 and RUQ drain on 9.17.21  Subjective:  Patient alert and laying in bed, calm . Mother is at beside. Denies any fevers, headache, chest pain, SOB, cough, , nausea or  Vomiting. Patient does endorse right flank pain but states that it has improved with placement of the third drain. Patient is requesting pain medication at this time. Bedside RN made aware.  Allergies: Penicillins and Tomato  Medications: Prior to Admission medications   Medication Sig Start Date End Date Taking? Authorizing Provider  losartan-hydrochlorothiazide (HYZAAR) 100-25 MG tablet Take 1 tablet by mouth daily. 03/26/19 03/25/20 Yes Willy Eddy, MD  azithromycin (ZITHROMAX Z-PAK) 250 MG tablet Take 2 tablets (500 mg) on  Day 1,  followed by 1 tablet (250 mg) once daily on Days 2 through 5. Patient not taking: Reported on 11/09/2019 12/06/17   Bridget Hartshorn L, PA-C  ondansetron (ZOFRAN) 4 MG tablet Take 1 tablet (4 mg total) by mouth daily as needed. Patient not taking: Reported on 11/09/2019 03/26/19 03/25/20  Willy Eddy, MD     Vital Signs: BP 133/80 (BP Location: Left Wrist)   Pulse (!) 122   Temp 98.6 F (37 C) (Oral)   Resp (!) 22   Ht 5\' 9"  (1.753 m)   Wt (!) 436 lb 8.2 oz (198 kg)   LMP 11/11/2019   SpO2 99%   BMI 64.46 kg/m   Physical Exam Vitals and nursing note reviewed.  Constitutional:      Appearance: She is obese.  HENT:     Head: Normocephalic and atraumatic.  Eyes:     Conjunctiva/sclera: Conjunctivae normal.  Cardiovascular:     Rate and Rhythm: Regular rhythm. Tachycardia present.  Pulmonary:     Comments: On nasal cannula. Slightly labored with some use of accessory muscles. Abdominal:     Comments: Positive Right inferior and RUQ drain to suction. site is unremarkable  with no erythema, edema, tenderness, bleeding or drainage noted at exit site. Suture and stat lock in place. Dressing is clean dry and intact. 5 ml of  Pink tinged colored fluid noted in bulb suction device.     Musculoskeletal:     Cervical back: Normal range of motion.  Neurological:     Mental Status: She is alert and oriented to person, place, and time.     Imaging: CT IMAGE GUIDED DRAINAGE BY PERCUTANEOUS CATHETER  Result Date: 11/27/2019 INDICATION: Right abdominal abscess EXAM: CT-guided 12 French drainage right abdominal abscess MEDICATIONS: The patient is currently admitted to the hospital and receiving intravenous antibiotics. The antibiotics were administered within an appropriate time frame prior to the initiation of the procedure. ANESTHESIA/SEDATION: Fentanyl 1.0 mcg IV; Versed 50 mg IV Moderate Sedation Time:  12 minutes The patient was continuously monitored during the procedure by the interventional radiology nurse under my direct supervision. COMPLICATIONS: None immediate. PROCEDURE: Informed written consent was obtained from the patient after a thorough discussion of the procedural risks, benefits and alternatives. All questions were addressed. Maximal Sterile Barrier Technique was utilized including caps, mask, sterile gowns, sterile gloves, sterile drape, hand hygiene and skin antiseptic. A timeout was performed prior to the initiation of the procedure. Previous imaging reviewed. Patient positioned right anterior oblique. Noncontrast localization CT performed. The right abdominal abscess was localized and marked. Under sterile conditions and local anesthesia,  an 18 gauge 15 cm access needle was advanced into the abscess. Needle position confirmed with CT. Syringe aspiration yielded purulent fluid. Sample sent for culture. Guidewire inserted followed by tract dilatation to insert a 12 [place] drain. Drain catheter position confirmed CT. 80 cc purulent fluid aspirated. Catheter secured  with Prolene suture and connected to external suction bulb. Sterile dressing applied. No immediate complication. Patient tolerated the procedure well. IMPRESSION: Successful CT-guided 12 French right abdominal abscess drain placement Electronically Signed   By: Judie Petit.  Shick M.D.   On: 11/27/2019 13:23    Labs:  CBC: Recent Labs    11/24/19 0635 11/25/19 0501 11/26/19 1500 11/30/19 0354  WBC 15.8* 17.0* 17.1* 16.2*  HGB 7.1* 7.5* 7.4* 7.0*  HCT 23.3* 24.4* 23.5* 22.1*  PLT 233 299 310 429*    COAGS: Recent Labs    11/09/19 1758  INR 1.1  APTT 30    BMP: Recent Labs    11/27/19 0638 11/28/19 0935 11/29/19 0602 11/30/19 0354  NA 137 135 137 135  K 4.6 4.0 4.3 4.3  CL 93* 90* 96* 95*  CO2 37* 38* 35* 33*  GLUCOSE 185* 197* 178* 166*  BUN 16 17 15 15   CALCIUM 7.7* 7.6* 7.3* 7.5*  CREATININE 0.58 0.51 0.56 0.54  GFRNONAA >60 >60 >60 >60  GFRAA >60 >60 >60 >60    LIVER FUNCTION TESTS: Recent Labs    11/19/19 0436 11/19/19 0436 11/23/19 0543 11/23/19 0543 11/25/19 0501 11/26/19 0642 11/27/19 0638 11/30/19 0354  BILITOT 0.8  --  0.4  --   --  0.4  --  0.5  AST 11*  --  22  --   --  24  --  24  ALT 9  --  12  --   --  20  --  29  ALKPHOS 43  --  62  --   --  83  --  137*  PROT 5.0*  --  6.5  --   --  7.5  --  7.5  ALBUMIN 1.7*   < > 1.5*   < > 1.7* 1.6* 1.5* 1.4*   < > = values in this interval not displayed.    Assessment and Plan:  34 y.o. female inpatient. History of HTN and morbid obesity, choledocholithiasis  s/p cholecystectomy in 2014. Presented to the ED At [facility] with persistent abdominal pain X 2 -3 weeks with worsening in pain X 2 days and dysuria. The patient was found to have a diverticulitis with peritoneal abscess. IR placed a pelvic abscess drain via trans gluteal approach on 9.3.21. On 9.8. 21 the patient had robotic lysis of adhesions, washout and surgical drain placement to pelvic abscess. CT abdomen pelvis from 9.15.21 showed additional fluid  collections and  ongoing fevers and leukocytosis. IR placed a second drain 9.17.21 (3 in total). Per Epic output is:  Right inferior - 102ml, 10 ,ml  RUQ - 30 ml, 61ml  WBC is 16.2, Hgb 7.0. Patient is afebrile. Per note from 9m PA in surgery dated 9.20.21. Continue drains, we may be able to removed posterior right back drain in the next 24-48 hours. Maintain RLQ and RUQ for now.   IR will continue to follow along - plans per  [facility].   Electronically Signed: 12-12-1974, NP 11/30/2019, 12:11 PM   I spent a total of 15 Minutes at the patient's bedside AND on the patient's hospital floor or unit, greater than 50% of which was  counseling/coordinating care for intra abdominal abscesses

## 2019-11-30 NOTE — Progress Notes (Signed)
[facility] SURGICAL PROGRESS NOTE  Hospital Day(s): 21.   Post op day(s): 12 Days Post-Op.   Interval History:  Patient seen and examined no acute events or new complaints overnight.  Patient reports she is doing okay, she endorses abdominal pain but is unable to localize this No nausea or emesis She has continued without fever in last 24 hours, stable low tachycardia throughout admission Leukocytosis improved this morning, down to 16.2K Renal function remains normal, sCr - 0.54, good UO - 2.6L No electrolyte derangements Some degree of malnutrition with serum albumin - 1.7  Percutaneous drains with total of 85 ccs out, most from RUQ and RLQ drains, right flank drain with only 5 ccs, these are serous and serosanguinous She remains NPO and on TPN Working with therapy; recommending SNF   Vital signs in last 24 hours: [min-max] current  Temp:  [98.5 F (36.9 C)-99.4 F (37.4 C)] 99.4 F (37.4 C) (09/19 1956) Pulse Rate:  [106-115] 113 (09/19 1958) Resp:  [16-20] 20 (09/19 1958) BP: (144-158)/(88-90) 158/90 (09/19 1956) SpO2:  [100 %] 100 % (09/19 1956) FiO2 (%):  [0 %] 0 % (09/19 1100) Weight:  [591 kg] 198 kg (09/20 0500)     Height: 5\' 9"  (175.3 cm) Weight: (!) 198 kg BMI (Calculated): 64.43   Intake/Output last 2 shifts:  09/19 0701 - 09/20 0700 In: 2729.1 [I.V.:1704.9; IV Piggyback:994.2] Out: 2735 [Urine:2650; Drains:85]   Physical Exam:  Constitutional: alert, cooperative and no distress Respiratory:tachypneic,breathing appears labored however this is relatively unchanged from previous, on Wilkin, refusing BiPAP Cardiovascular:Tachycardicand sinus rhythm  Gastrointestinal:Obese, soft, no appreciable tenderness, non-distended, JP in RLQ appears more serous, right flank drain is serous, RUQ drain is serosanguinous Genitourinary: Foley in place Musculoskeletal: 1+ edema to bilateral LE Integumentary:Laparoscopic incisions are CDI with dermabond, no  erythema or drainage  Labs:  CBC Latest Ref Rng & Units 11/30/2019 11/26/2019 11/25/2019  WBC 4.0 - 10.5 K/uL 16.2(H) 17.1(H) 17.0(H)  Hemoglobin 12.0 - 15.0 g/dL 7.0(L) 7.4(L) 7.5(L)  Hematocrit 36 - 46 % 22.1(L) 23.5(L) 24.4(L)  Platelets 150 - 400 K/uL 429(H) 310 299   CMP Latest Ref Rng & Units 11/30/2019 11/29/2019 11/28/2019  Glucose 70 - 99 mg/dL 11/30/2019) 638(G) 665(L)  BUN 6 - 20 mg/dL 15 15 17   Creatinine 0.44 - 1.00 mg/dL 935(T 0.17  Sodium 135 - 145 mmol/L 135 137 135  Potassium 3.5 - 5.1 mmol/L 4.3 4.3 4.0  Chloride 98 - 111 mmol/L 95(L) 96(L) 90(L)  CO2 22 - 32 mmol/L 33(H) 35(H) 38(H)  Calcium 8.9 - 10.3 mg/dL 7.5(L) 7.3(L) 7.6(L)  Total Protein 6.5 - 8.1 g/dL 7.5 - -  Total Bilirubin 0.3 - 1.2 mg/dL 0.5 - -  Alkaline Phos 38 - 126 U/L 137(H) - -  AST 15 - 41 U/L 24 - -  ALT 0 - 44 U/L 29 - -     Imaging studies: No new pertinent imaging studies   Assessment/Plan:  34 y.o. female 12 Days Post-Op s/p robotic assisted laparoscopic lysis of adhesion and drainage of intra-abdominal abscess WITHOUT evidence of perforation or frank stoolforperforated diverticulitis, complicated by hypoxic respiratory failure requiring post-operative intubation (since extubated).   - I think we are at a point where it is reasonable to initiate diet, start with CLD + Boost Breeze; go slow  - Continue TPN at goal rate  - Continue IV ABx; switched to Meropenem, andifungalin added; ID on board(Day 11)             -  Continue drains; we may be able to remove posterior right back drain in next 24-48 hours. Maintain RLQ and RUQ drains for now - No plans for surgical re-intervention at this time - Monitor abdominal examination; on-going bowel function - Pain control prn; antiemetics prn - Monitor leukocytosis, renal function - Home medications; continue; appreciate hospitalist consultation -Mobilization encouraged as  tolerated; PT/OT following - DVT prophylaxis  All of the above findings and recommendations were discussed with the patient, and the medical team, and all of patient's questions were answered to her expressed satisfaction.  -- Lynden Oxford, PA-C [facility] 11/30/2019, 7:15 AM [phone] M-F: 7am - 4pm

## 2019-11-30 NOTE — Consult Note (Signed)
PHARMACY - TOTAL PARENTERAL NUTRITION CONSULT NOTE   Indication: Prolonged ileus  Patient Measurements: Height: 5\' 9"  (175.3 cm) Weight: (!) 198 kg (436 lb 8.2 oz) IBW/kg (Calculated) : 66.2 TPN AdjBW (KG): 90.5 Body mass index is 64.46 kg/m.  Assessment: 34 year old female with PMHx of HTN admitted with concern for perforated diverticulitis with contained abscesses. Patient is POD # 12 from laparoscopic lysis of adhesions and drainage of intra-abdominal abscess. There was no evidence of perforation. Procedure complicated by hypoxic respiratory failure requiring intubation. Patient extubated 9/10. No longer requiring blood pressure support. Since the previous note she has been re-assessed for calories and nutrition goals.  Glucose / Insulin: A1C 5.7, BG range 160 - 202: 19 units SSI required.  Electrolytes: Cl:Ac ratio improving  Cl trending down now, CO2 trending up now  Renal: SCr < 1, stable  (good UOP) Renal function remains normal, SCr - 0.54, good UO - 2.6L LFTs / TGs: LFTs WNL at baseline, TG 69-->193 Prealbumin / albumin: <5 / 1.5 Intake / Output; MIVF: Continue to monitor I&Os and needfor intermittent Lasix as tolerated to maintain euvolemia.  GI Imaging: 9/7 CT abdomen pelvis: colonic perforation related to diverticulitis with worsening fluid and gas containing abscesses is throughout the low abdomen and pelvis many with interloop and mesenteric involvement, also with multiple areas of pelvic fluid as well showing generalized inflammation tracking to the mid abdomen. Interval decompression of the abscess in the cul-de-sac following drain placement. Extensive body wall edema.  9/15 CT concerning for perihepatic fluid collection and possible abscess  Surgeries / Procedures:  9/8 Robotic assisted laparoscopic lysis of adhesions and drainage of intra-abdominal abscess  9/17 IR placement of drain in perihepatic fluid collection   Central access: 11/19/19 TPN start date:  11/19/19  Nutritional Goals  kCal: 2700 - 3000 kCal/day, Protein: >135 g/day, Fluid: > 2000 mL Goal TPN rate is 90 mL/hr (provides 140 g of protein and 2999 kcals per day)  Current Nutrition:  NPO   Plan:   Continue TPN rate of 90 mL/hr at 1800 using concentrated amino acids  Nutritional components: 21% dextrose, 65 g/L amino acids, 41.5 g/L lipids, 2999 kCal  Electrolytes in TPN: 2mEq/L of Na, 77mEq/L of K, 50mEq/L of Ca, 61mEq/L of Mg, phosphorous 15 mmol/L, continue Cl:Ac ratio at max (currently 1 : 1.04)  Add standard MVI and trace elements to TPN  Continue resistant SSI to q6h SSI and continue to adjust as needed - continue insulin to 50 units insulin regular in TPN  Fluid amount 4m  Monitor TPN labs daily until stable then on Mon/Thurs  11/30/2019 7:03 AM

## 2019-12-01 ENCOUNTER — Telehealth: Payer: Self-pay | Admitting: Emergency Medicine

## 2019-12-01 DIAGNOSIS — K5720 Diverticulitis of large intestine with perforation and abscess without bleeding: Secondary | ICD-10-CM | POA: Diagnosis not present

## 2019-12-01 LAB — CBC
HCT: 20.8 % — ABNORMAL LOW (ref 36.0–46.0)
Hemoglobin: 6.7 g/dL — ABNORMAL LOW (ref 12.0–15.0)
MCH: 24.2 pg — ABNORMAL LOW (ref 26.0–34.0)
MCHC: 32.2 g/dL (ref 30.0–36.0)
MCV: 75.1 fL — ABNORMAL LOW (ref 80.0–100.0)
Platelets: 403 10*3/uL — ABNORMAL HIGH (ref 150–400)
RBC: 2.77 MIL/uL — ABNORMAL LOW (ref 3.87–5.11)
RDW: 19.5 % — ABNORMAL HIGH (ref 11.5–15.5)
WBC: 13.7 10*3/uL — ABNORMAL HIGH (ref 4.0–10.5)
nRBC: 0.2 % (ref 0.0–0.2)

## 2019-12-01 LAB — BASIC METABOLIC PANEL
Anion gap: 8 (ref 5–15)
BUN: 19 mg/dL (ref 6–20)
CO2: 32 mmol/L (ref 22–32)
Calcium: 7.5 mg/dL — ABNORMAL LOW (ref 8.9–10.3)
Chloride: 96 mmol/L — ABNORMAL LOW (ref 98–111)
Creatinine, Ser: 0.52 mg/dL (ref 0.44–1.00)
GFR calc Af Amer: >60 mL/min (ref >60)
GFR calc non Af Amer: >60 mL/min (ref >60)
Glucose, Bld: 174 mg/dL — ABNORMAL HIGH (ref 70–99)
Potassium: 4.3 mmol/L (ref 3.5–5.1)
Sodium: 136 mmol/L (ref 135–145)

## 2019-12-01 LAB — GLUCOSE, CAPILLARY
Glucose-Capillary: 146 mg/dL — ABNORMAL HIGH (ref 70–99)
Glucose-Capillary: 154 mg/dL — ABNORMAL HIGH (ref 70–99)
Glucose-Capillary: 174 mg/dL — ABNORMAL HIGH (ref 70–99)
Glucose-Capillary: 178 mg/dL — ABNORMAL HIGH (ref 70–99)
Glucose-Capillary: 185 mg/dL — ABNORMAL HIGH (ref 70–99)
Glucose-Capillary: 199 mg/dL — ABNORMAL HIGH (ref 70–99)

## 2019-12-01 LAB — PREPARE RBC (CROSSMATCH)

## 2019-12-01 MED ORDER — TRACE MINERALS CU-MN-SE-ZN 300-55-60-3000 MCG/ML IV SOLN
INTRAVENOUS | Status: AC
  Filled 2019-12-01: qty 936

## 2019-12-01 MED ORDER — SODIUM CHLORIDE 0.9% IV SOLUTION: Freq: Once | INTRAVENOUS | Status: AC

## 2019-12-01 NOTE — Telephone Encounter (Signed)
Patient called wanting an update on FMLA paperwork, if it was sent. Please contact patient back.

## 2019-12-01 NOTE — Progress Notes (Addendum)
Nutrition Follow-up  DOCUMENTATION CODES:   Morbid obesity  INTERVENTION:   TPN per pharmacy  Boost Breeze po TID, each supplement provides 250 kcal and 9 grams of protein  NUTRITION DIAGNOSIS:   Inadequate oral intake related to inability to eat as evidenced by NPO status. -pt advanced to clear liquid diet   GOAL:   Patient will meet greater than or equal to 90% of their needs -met with TPN  MONITOR:   PO intake, Supplement acceptance, Diet advancement, Labs, Weight trends, Skin, I & O's, TPN  ASSESSMENT:   34 year old female with PMHx of HTN admitted with perforated diverticulitis with contained abscesses.  9/8 s/p robotic lysis of adhesions with irrigation and debridement of multiple intra-abdominal/peritoneal/pelvis abscesses with drain placement  Pt tolerating TPN well at goal rate. Refeed labs stable. Pt initiated on a clear liquid diet with Boost Breeze yesterday; pt is documented to be eating 0% of meals. Pt is having type 7 stools and passing flatus. Drains with 46m output. Per chart, pt appears to be weight stable over the past 6 days; pt is currently up ~60lbs from her last documented weight on 1/13. Lasix discontinued. Pt is noted to have moderate edema. Plan is for SNF after discharge.   Medications reviewed and include: lovenox, insulin, protonix  Labs reviewed: K 4.3 wnl P 2.9 wnl, Mg 1.9 wnl BNP- 128.1(H)- 9/18 Triglycerides 193- 9/20 Pre-albumin- 8.4(L)- 9/20 Wbc- 13.7(H), Hgb 6.7(L), Hct 20.8(L), MCV 75.1(L), MCH 24.2(L) cbgs- 185, 174, 178 x 24 hrs  Diet Order:   Diet Order            Diet clear liquid Room service appropriate? Yes; Fluid consistency: Thin  Diet effective now                EDUCATION NEEDS:   No education needs have been identified at this time  Skin:  Skin Assessment: Skin Integrity Issues: (closed incisions to abdomen and vagina)  Last BM:  9/20- 932mvia rectal tube  Height:   Ht Readings from Last 1 Encounters:   11/09/19 _0  (1.753 m)   Weight:   Wt Readings from Last 1 Encounters:  11/30/19 (!) 198 kg   Ideal Body Weight:  65.9 kg  BMI:  Body mass index is 64.46 kg/m.  Estimated Nutritional Needs:   Kcal:  2700-3000kcal/day  Protein:  >135g/day  Fluid:  >/= 2 L/day  CaKoleen DistanceS, RD, LDN Please refer to [facility] and/or RD on-call/weekend/after hours pager

## 2019-12-01 NOTE — Progress Notes (Signed)
Patient refused to allow nursing to remove oxygen at this time. Per patient wait till pain has decreased.  Madie Reno, RN

## 2019-12-01 NOTE — NC FL2 (Signed)
[place] MEDICAID FL2 LEVEL OF CARE SCREENING TOOL     IDENTIFICATION  Patient Name: Amanda Reyes Birthdate: [birth year] Sex: female Admission Date (Current Location): 11/09/2019  [place] Number:  Chiropodist and Address:  [facility], [address] [place], [place] [address]      Provider Number:    Attending Physician Name and Address:  Campbell Lerner, MD  Relative Name and Phone Number:  Diane Bovard-mother [phone]    Current Level of Care: Hospital Recommended Level of Care: Skilled Nursing Facility Prior Approval Number:    Date Approved/Denied:   PASRR Number: [id number]  Discharge Plan: SNF    Current Diagnoses: Patient Active Problem List   Diagnosis Date Noted  . Diverticulitis of large intestine with perforation and abscess 11/23/2019  . Diverticulitis of colon with perforation 11/09/2019  . Cholelithiasis 08/01/2012  . Obesity 08/01/2012    Orientation RESPIRATION BLADDER Height & Weight     Self, Time, Situation, Place    Incontinent Weight: (!) 436 lb 8.2 oz (198 kg) Height:  5\' 9"  (175.3 cm)  BEHAVIORAL SYMPTOMS/MOOD NEUROLOGICAL BOWEL NUTRITION STATUS      Incontinent    AMBULATORY STATUS COMMUNICATION OF NEEDS Skin   Total Care Verbally                         Personal Care Assistance Level of Assistance  Bathing, Feeding, Dressing, Total care Bathing Assistance: Maximum assistance Feeding assistance: Limited assistance Dressing Assistance: Maximum assistance Total Care Assistance: Maximum assistance   Functional Limitations Info             SPECIAL CARE FACTORS FREQUENCY  PT (By licensed PT), OT (By licensed OT)     PT Frequency: 5 x weekly OT Frequency: 5 x weekly            Contractures Contractures Info: Not present    Additional Factors Info  Code Status Code Status Info: Full             Current Medications (12/01/2019):  This is the current  hospital active medication list Current Facility-Administered Medications  Medication Dose Route Frequency Provider Last Rate Last Admin  . 0.9 %  sodium chloride infusion (Manually program via Guardrails IV Fluids)   Intravenous Once 12/03/2019, MD      . 0.9 %  sodium chloride infusion   Intravenous PRN Campbell Lerner, MD 10 mL/hr at 12/01/19 1530 500 mL at 12/01/19 1530  . 0.9 %  sodium chloride infusion  250 mL Intravenous Continuous Kasa, Kurian, MD      . 0.9 %  sodium chloride infusion   Intravenous PRN 12/03/19, PA-C   Stopped at 11/28/19 1707  . acetaminophen (TYLENOL) tablet 1,000 mg  1,000 mg Oral Q6H PRN 11/30/19, RPH   1,000 mg at 11/30/19 2058  . alum & mag hydroxide-simeth (MAALOX/MYLANTA) 200-200-20 MG/5ML suspension 30 mL  30 mL Oral Q4H PRN 09-18-2000, MD   30 mL at 11/15/19 2032  . Chlorhexidine Gluconate Cloth 2 % PADS 6 each  6 each Topical 2033 [id number], MD   6 each at 12/01/19 0546  . enoxaparin (LOVENOX) injection 40 mg  40 mg Subcutaneous Q12H 12/03/19, MD   40 mg at 12/01/19 0841  . feeding supplement (BOOST / RESOURCE BREEZE) liquid 1 Container  1 Container Oral TID BM 12/03/19, PA-C   1 Container at  12/01/19 1049  . fentaNYL (SUBLIMAZE) injection   Intravenous PRN Berdine Dance, MD   50 mcg at 11/27/19 1232  . fluconazole (DIFLUCAN) IVPB 800 mg  800 mg Intravenous Q24H Lynn Ito, MD   Stopped at 11/30/19 1914  . HYDROmorphone (DILAUDID) injection 0.5-1 mg  0.5-1 mg Intravenous Q2H PRN Donovan Kail, PA-C   1 mg at 12/01/19 1526  . insulin aspart (novoLOG) injection 0-20 Units  0-20 Units Subcutaneous Q6H Lowella Bandy, RPH   4 Units at 12/01/19 1159  . ipratropium-albuterol (DUONEB) 0.5-2.5 (3) MG/3ML nebulizer solution 3 mL  3 mL Nebulization Q4H PRN Manuela Schwartz, NP   3 mL at 11/24/19 0503  . labetalol (NORMODYNE) injection 20 mg  20 mg Intravenous Q4H PRN Manuela Schwartz, NP   20 mg at 11/30/19  2103  . lip balm (BLISTEX) ointment   Topical PRN Andreas Ohm, NP   Given at 11/24/19 2154  . LORazepam (ATIVAN) injection 2 mg  2 mg Intravenous Q6H PRN Donovan Kail, PA-C   2 mg at 11/24/19 2149  . MEDLINE mouth rinse  15 mL Mouth Rinse q12n4p Salena Saner, MD   15 mL at 11/30/19 1713  . meropenem (MERREM) 1 g in sodium chloride 0.9 % 100 mL IVPB  1 g Intravenous Q8H Ravishankar, Jayashree, MD 200 mL/hr at 12/01/19 1531 1 g at 12/01/19 1531  . methocarbamol (ROBAXIN) tablet 500 mg  500 mg Oral Q6H PRN Campbell Lerner, MD   500 mg at 12/01/19 1049  . midazolam (VERSED) injection   Intravenous PRN Berdine Dance, MD   1 mg at 11/27/19 1232  . ondansetron (ZOFRAN-ODT) disintegrating tablet 4 mg  4 mg Oral Q6H PRN Campbell Lerner, MD   4 mg at 11/15/19 2028   Or  . ondansetron [facility]) injection 4 mg  4 mg Intravenous Q6H PRN Campbell Lerner, MD   4 mg at 11/17/19 0801  . oxyCODONE (Oxy IR/ROXICODONE) immediate release tablet 5-10 mg  5-10 mg Oral Q4H PRN Campbell Lerner, MD   10 mg at 12/01/19 1049  . pantoprazole (PROTONIX) injection 40 mg  40 mg Intravenous Q12H Campbell Lerner, MD   40 mg at 12/01/19 0837  . polyvinyl alcohol (LIQUIFILM TEARS) 1.4 % ophthalmic solution 1 drop  1 drop Both Eyes PRN Campbell Lerner, MD   1 drop at 11/25/19 0453  . promethazine (PHENERGAN) injection 12.5 mg  12.5 mg Intravenous Q6H PRN Campbell Lerner, MD   12.5 mg at 11/22/19 0138  . sodium chloride flush (NS) 0.9 % injection 10-40 mL  10-40 mL Intracatheter PRN Campbell Lerner, MD      . sodium chloride flush (NS) 0.9 % injection 10-40 mL  10-40 mL Intracatheter Q12H Erin Fulling, MD   40 mL at 12/01/19 0900  . sodium chloride flush (NS) 0.9 % injection 10-40 mL  10-40 mL Intracatheter PRN Erin Fulling, MD   10 mL at 11/26/19 1511  . sodium chloride flush (NS) 0.9 % injection 5 mL  5 mL Intracatheter Q8H Campbell Lerner, MD   5 mL at 12/01/19 1418  . sodium chloride flush (NS) 0.9 %  injection 5 mL  5 mL Intracatheter Q8H Berdine Dance, MD   5 mL at 12/01/19 1417  . TPN ADULT (ION)   Intravenous Continuous TPN Lowella Bandy, RPH 90 mL/hr at 12/01/19 1400 Rate Verify at 12/01/19 1400  . TPN ADULT (ION)   Intravenous Continuous TPN Lowella Bandy, [facility]      .  traMADol (ULTRAM) tablet 50 mg  50 mg Oral Q6H PRN Salena Saner, MD   50 mg at 11/21/19 1501     Discharge Medications: Please see discharge summary for a list of discharge medications.  Relevant Imaging Results:  Relevant Lab Results:   Additional Information SS# [id number]  Eilleen Kempf, LCSW

## 2019-12-01 NOTE — Consult Note (Signed)
PHARMACY - TOTAL PARENTERAL NUTRITION CONSULT NOTE   Indication: Prolonged ileus  Patient Measurements: Height: 5\' 9"  (175.3 cm) Weight: (!) 198 kg (436 lb 8.2 oz) IBW/kg (Calculated) : 66.2 TPN AdjBW (KG): 90.5 Body mass index is 64.46 kg/m.  Assessment: 34 year old female with PMHx of HTN admitted with concern for perforated diverticulitis with contained abscesses. Patient is POD # 12 from laparoscopic lysis of adhesions and drainage of intra-abdominal abscess. There was no evidence of perforation. Procedure complicated by hypoxic respiratory failure requiring intubation. Patient extubated 9/10. No longer requiring blood pressure support. Since the previous note she has been re-assessed for calories and nutrition goals.  Glucose / Insulin: A1C 5.7, BG range 160 - 185: 16 units SSI required.  Electrolytes: Cl:Ac ratio improving  Cl trending down now, CO2 trending up now  Renal: SCr < 1, stable  (good UOP) Renal function remains normal, SCr - 0.52, good UO - 4.2 L LFTs / TGs: LFTs WNL at baseline, TG 69-->193 Prealbumin / albumin: <5 / 1.5 Intake / Output; MIVF: Continue to monitor I&Os and needfor intermittent Lasix as tolerated to maintain euvolemia.  GI Imaging: 9/7 CT abdomen pelvis: colonic perforation related to diverticulitis with worsening fluid and gas containing abscesses is throughout the low abdomen and pelvis many with interloop and mesenteric involvement, also with multiple areas of pelvic fluid as well showing generalized inflammation tracking to the mid abdomen. Interval decompression of the abscess in the cul-de-sac following drain placement. Extensive body wall edema.  9/15 CT concerning for perihepatic fluid collection and possible abscess  Surgeries / Procedures:  9/8 Robotic assisted laparoscopic lysis of adhesions and drainage of intra-abdominal abscess  9/17 IR placement of drain in perihepatic fluid collection   Central access: 11/19/19 TPN start date:  11/19/19  Nutritional Goals  kCal: 2700 - 3000 kCal/day, Protein: >135 g/day, Fluid: > 2000 mL Goal TPN rate is 90 mL/hr (provides 140 g of protein and 2999 kcals per day)  Current Nutrition:  She was started on CLD yesterday and tolerating well  Plan:   Continue TPN rate of 90 mL/hr at 1800 using concentrated amino acids  Nutritional components: 21% dextrose, 65 g/L amino acids, 41.5 g/L lipids, 2999 kCal  Electrolytes in TPN: 68mEq/L of Na, 37mEq/L of K, 6mEq/L of Ca, 61mEq/L of Mg, phosphorous 15 mmol/L, continue Cl:Ac ratio at max (currently 1 : 1.04)  Add standard MVI and trace elements to TPN  Continue resistant SSI to q6h SSI and continue to adjust as needed - continue insulin to 50 units insulin regular in TPN  Fluid amount 4m  Monitor TPN labs daily until stable then on Mon/Thurs  12/01/2019 6:58 AM

## 2019-12-01 NOTE — Progress Notes (Signed)
Patient declined Bipap at this time. Patient advised to call Respiratory if she changes her mind. Bipap on standby at bedside. Patient on 2L [place], tolerating well.

## 2019-12-01 NOTE — Progress Notes (Signed)
Physical Therapy Treatment Patient Details Name: Amanda Reyes MRN: [id number] DOB: February 17, 1986 Today's Date: 12/01/2019    History of Present Illness Pt is 34 year old female with past medical history for morbid obesity and hypertension, admitted to ICU due to acute diverticulitis and microperforation.  Patient is s/p of robotic surgery for lysis of adhesions.    PT Comments    Per MD, Campbell Lerner, pt was okay to mobilize within tolerance. Hemoglobin was 6.7 and pt has a blood transfusion ordered. Pt was found supine in bed with mother. Pt agreed to bed exercises but expressed concern about sitting at the EOB and was worried about their breathing. Pt was educated about the importance of sitting and was reassure that PT would keep within tolerence, but pt still refused due to previously listed concerns. SpO2 remained in the upper 90's during the session. Rolling was performed with Max +3 to allow the pt's nurse to inspect bandaging. Pt will benefit from PT services in a SNF setting upon discharge to safely address deficits listed in patient problem list for decreased caregiver assistance and eventual return to PLOF.   Follow Up Recommendations  SNF     Equipment Recommendations  Other (comment)    Recommendations for Other Services       Precautions / Restrictions Precautions Precautions: Fall Restrictions Weight Bearing Restrictions: No    Mobility  Bed Mobility Overal bed mobility: Needs Assistance Bed Mobility: Rolling Rolling: +2 for physical assistance;Max assist         General bed mobility comments: pt deffered sitting EOB due to being anxious about her breathing  Transfers                 General transfer comment: unable/ unsafe to trial   Ambulation/Gait             General Gait Details: unsafe to attempt   Stairs             Wheelchair Mobility    Modified Rankin (Stroke Patients Only)       Balance                                             Cognition Arousal/Alertness: Awake/alert Behavior During Therapy: Flat affect;WFL for tasks assessed/performed Overall Cognitive Status: Within Functional Limits for tasks assessed                                 General Comments: pt reported several times throughout the session that she felt very anxious.      Exercises Total Joint Exercises Ankle Circles/Pumps: AROM;Both;15 reps Quad Sets: AROM;10 reps;Both Hip ABduction/ADduction: AROM;Both;10 reps Straight Leg Raises: AROM;Both;10 reps Other Exercises Other Exercises: reviewed importance of performing ther ex throughout the day to maintain current strength Other Exercises: Pt/ mother educated on the physiological benefits of activity and risks of being sedentary/ supine throughout the day    General Comments        Pertinent Vitals/Pain Pain Score: 8  Pain Location: Lower Right abdomen Pain Descriptors / Indicators: Cramping;Aching Pain Intervention(s): Limited activity within patient's tolerance;Monitored during session    Home Living                      Prior Function  PT Goals (current goals can now be found in the care plan section) Acute Rehab PT Goals Patient Stated Goal: Go home and be able to swim again PT Goal Formulation: With patient Time For Goal Achievement: 12/08/19 Potential to Achieve Goals: Fair Progress towards PT goals: Progressing toward goals    Frequency    Min 2X/week      PT Plan Current plan remains appropriate    Co-evaluation              AM-PAC PT "6 Clicks" Mobility   Outcome Measure  Help needed turning from your back to your side while in a flat bed without using bedrails?: A Lot Help needed moving from lying on your back to sitting on the side of a flat bed without using bedrails?: Total Help needed moving to and from a bed to a chair (including a wheelchair)?: Total Help needed standing up  from a chair using your arms (e.g., wheelchair or bedside chair)?: Total Help needed to walk in hospital room?: Total Help needed climbing 3-5 steps with a railing? : Total 6 Click Score: 7    End of Session Equipment Utilized During Treatment: Gait belt;Oxygen Activity Tolerance: Other (comment);Patient tolerated treatment well (pt limited by anxiety) Patient left: in bed;with nursing/sitter in room Nurse Communication: Mobility status PT Visit Diagnosis: Repeated falls (R29.6);Muscle weakness (generalized) (M62.81);Difficulty in walking, not elsewhere classified (R26.2) Pain - Right/Left: Right     Time: 0160-1093 PT Time Calculation (min) (ACUTE ONLY): 32 min  Charges:                        Nicolette Bang, SPT 12/01/19. 2:23 PM

## 2019-12-01 NOTE — Progress Notes (Signed)
Progress Note    Amanda KurtzShamika L Blandino  ZOX:[id number] DOB: 03/27/1985  DOA: 11/09/2019 PCP: Patient, No Pcp Per      Brief Narrative:    Medical records reviewed and are as summarized below:  Amanda Reyes is a 34 y.o. female with complicated case of acute diverticulitis. Patient had perc drains placed 11/17/2019, symptoms of fevers and abd pain persisted. Patient underwent robotic lysis of adhesions with irrigation and debridement of multiple intra-abdominal/peritoneal/pelvic abscesses with drain placement on 11/18/2019.Postoperatively patient was unable to wean from the ventilator due to severe hypoventilation from morbid obesity (BMI 68) and metabolic acidosis.  She was extubated on 11/20/2019 and transitioned to BiPAP.  She has been requiring BiPAP nightly and as needed.  She was moved out of the ICU on 11/24/2019 and transferred to the medical floor for continued management.  She had recurrent fevers and persistent leukocytosis.  Repeat CT abdomen and pelvis on 11/25/2019 showed additional fluid collections.  Another abdominal drain was placed on 11/27/2019    Assessment/Plan:   Active Problems:   Diverticulitis of colon with perforation   Diverticulitis of large intestine with perforation and abscess   Nutrition Problem: Inadequate oral intake Etiology: inability to eat  Signs/Symptoms: NPO status   Body mass index is 64.46 kg/m.  (Morbid obesity)  Acute hypoxic and hypercapnic respiratory failure Obesity hypoventilation syndrome Probable severe OSA Multiple intra-abdominal/peritoneal/pelvic abscesses with drain placement Type II DM with hyperglycemia Hypoalbuminemia Perihepatic abscess Leukocytosis Sinus tachycardia-likely from underlying infection AKI-resolved   PLAN  S/p drain placement at surgery on 11/18/2019.  S/p CT-guided 12 French right abdominal abscess drain placement on 11/27/2019.  Continue IV meropenem and IV fluconazole.  She is on TPN for  nutrition.  Recommend blood transfusion for hemoglobin of 6.7.  Monitor H&H closely.  BiPAP nightly and as needed  Consider oxygen via nasal cannula as needed for hypoxia  Follow-up with ID and general surgeon  Plan discussed with her mother at the bedside    Diet Order            Diet clear liquid Room service appropriate? Yes; Fluid consistency: Thin  Diet effective now                    Medications:   . sodium chloride   Intravenous Once  . sodium chloride   Intravenous Once  . Chlorhexidine Gluconate Cloth  6 each Topical Q0600  . enoxaparin (LOVENOX) injection  40 mg Subcutaneous Q12H  . feeding supplement  1 Container Oral TID BM  . insulin aspart  0-20 Units Subcutaneous Q6H  . mouth rinse  15 mL Mouth Rinse q12n4p  . pantoprazole (PROTONIX) IV  40 mg Intravenous Q12H  . sodium chloride flush  10-40 mL Intracatheter Q12H  . sodium chloride flush  5 mL Intracatheter Q8H  . sodium chloride flush  5 mL Intracatheter Q8H   Continuous Infusions: . sodium chloride 10 mL/hr at 11/27/19 1617  . sodium chloride    . sodium chloride Stopped (11/28/19 1707)  . fluconazole (DIFLUCAN) IV Stopped (11/30/19 1914)  . meropenem (MERREM) IV Stopped (12/01/19 0544)  . TPN ADULT (ION) 90 mL/hr at 12/01/19 1400  . TPN ADULT (ION)       Anti-infectives (From admission, onward)   Start     Dose/Rate Route Frequency Ordered Stop   11/30/19 2200  meropenem (MERREM) 1 g in sodium chloride 0.9 % 100 mL IVPB  1 g 200 mL/hr over 30 Minutes Intravenous Every 8 hours 11/30/19 1531     11/28/19 1600  fluconazole (DIFLUCAN) IVPB 800 mg        800 mg 100 mL/hr over 240 Minutes Intravenous Every 24 hours 11/27/19 1642     11/20/19 1600  anidulafungin (ERAXIS) 100 mg in sodium chloride 0.9 % 100 mL IVPB  Status:  Discontinued        100 mg 78 mL/hr over 100 Minutes Intravenous Every 24 hours 11/20/19 1227 11/27/19 1642   11/19/19 1400  anidulafungin (ERAXIS) 200 mg in sodium  chloride 0.9 % 200 mL IVPB       Note to Pharmacy: Please place order for  tomorrow's dose   200 mg 78 mL/hr over 200 Minutes Intravenous  Once 11/19/19 1311 11/19/19 1714   11/19/19 1400  meropenem (MERREM) 1 g in sodium chloride 0.9 % 100 mL IVPB  Status:  Discontinued        1 g 200 mL/hr over 30 Minutes Intravenous Every 8 hours 11/19/19 1320 11/30/19 1325   11/09/19 2200  metroNIDAZOLE (FLAGYL) IVPB 500 mg  Status:  Discontinued       "And" Linked Group Details   500 mg 100 mL/hr over 60 Minutes Intravenous Every 8 hours 11/09/19 1810 11/19/19 1311   11/09/19 2000  ciprofloxacin (CIPRO) IVPB 400 mg  Status:  Discontinued       "And" Linked Group Details   400 mg 200 mL/hr over 60 Minutes Intravenous Every 12 hours 11/09/19 1810 11/19/19 1311   11/09/19 1815  metroNIDAZOLE (FLAGYL) IVPB 500 mg        500 mg 100 mL/hr over 60 Minutes Intravenous  Once 11/09/19 1800 11/09/19 2317   11/09/19 1815  ciprofloxacin (CIPRO) IVPB 400 mg        400 mg 200 mL/hr over 60 Minutes Intravenous  Once 11/09/19 1800 11/09/19 1946             Family Communication/Anticipated D/C date and plan/Code Status   DVT prophylaxis: enoxaparin (LOVENOX) injection 40 mg Start: 11/29/19 0900 SCD's Start: 11/23/19 1508 Place and maintain sequential compression device Start: 11/17/19 1017     Code Status: Full Code  Family Communication: Plan discussed with her mother at the bedside Disposition Plan: Per surgeon           Subjective:   Interval events noted.  She complains of abdominal pain graded as 4/10 in severity.  No other complaints.  Objective:    Vitals:   12/01/19 0102 12/01/19 0517 12/01/19 0537 12/01/19 1148  BP: (!) 131/92 (!) 134/91 (!) 137/95 (!) 145/95  Pulse: 100 (!) 108 (!) 103 (!) 113  Resp: (!) 24 (!) 23 (!) 24 (!) 22  Temp: 98.2 F (36.8 C) 98.3 F (36.8 C) 98.2 F (36.8 C) 98.4 F (36.9 C)  TempSrc: Oral Oral Oral Oral  SpO2: 100% 100% 100% 99%  Weight:       Height:       No data found.   Intake/Output Summary (Last 24 hours) at 12/01/2019 1425 Last data filed at 12/01/2019 1400 Gross per 24 hour  Intake 2116.42 ml  Output 945 ml  Net 1171.42 ml   Filed Weights   11/26/19 1541 11/29/19 0500 11/30/19 0500  Weight: (!) 197 kg (!) 198 kg (!) 198 kg    Exam:  GEN: NAD SKIN: Warm and dry EYES: Pale but anicteric ENT: Moist mucous membranes CV: Regular rate, tachycardic PULM: Clear to auscultation  bilaterally   ABD: soft, obese, right-sided abdominal tenderness, + 3 JP drains in the abdomen  CNS: Drowsy but communicative. EXT: No tenderness or swelling GU: Foley catheter draining amber urine Rectal tube in place   Data Reviewed:   I have personally reviewed following labs and imaging studies:  Labs: Labs show the following:   Basic Metabolic Panel: Recent Labs  Lab 11/25/19 0501 11/25/19 0501 11/26/19 9833 11/26/19 8250 11/27/19 5397 11/27/19 6734 11/28/19 0935 11/28/19 0935 11/29/19 0602 11/29/19 0602 11/30/19 0354 12/01/19 0515  NA 139   < > 139   < > 137  --  135  --  137  --  135 136  K 4.9   < > 4.5   < > 4.6   < > 4.0   < > 4.3   < > 4.3 4.3  CL 98   < > 94*   < > 93*  --  90*  --  96*  --  95* 96*  CO2 35*   < > 37*   < > 37*  --  38*  --  35*  --  33* 32  GLUCOSE 163*   < > 180*   < > 185*  --  197*  --  178*  --  166* 174*  BUN 16   < > 16   < > 16  --  17  --  15  --  15 19  CREATININE 0.60   < > 0.56   < > 0.58  --  0.51  --  0.56  --  0.54 0.52  CALCIUM 7.5*   < > 7.7*   < > 7.7*  --  7.6*  --  7.3*  --  7.5* 7.5*  MG 1.8  --  2.0  --  2.0  --   --   --   --   --  1.9  --   PHOS 3.0  --  3.3  --  3.1  --   --   --   --   --  2.9  --    < > = values in this interval not displayed.   GFR Estimated Creatinine Clearance: 187.7 mL/min (by C-G formula based on SCr of 0.52 mg/dL). Liver Function Tests: Recent Labs  Lab 11/25/19 0501 11/26/19 0642 11/27/19 0638 11/30/19 0354  AST  --  24  --  24   ALT  --  20  --  29  ALKPHOS  --  83  --  137*  BILITOT  --  0.4  --  0.5  PROT  --  7.5  --  7.5  ALBUMIN 1.7* 1.6* 1.5* 1.4*   No results for input(s): LIPASE, AMYLASE in the last 168 hours. No results for input(s): AMMONIA in the last 168 hours. Coagulation profile No results for input(s): INR, PROTIME in the last 168 hours.  CBC: Recent Labs  Lab 11/25/19 0501 11/26/19 1500 11/30/19 0354 12/01/19 0515  WBC 17.0* 17.1* 16.2* 13.7*  NEUTROABS  --   --  12.4*  --   HGB 7.5* 7.4* 7.0* 6.7*  HCT 24.4* 23.5* 22.1* 20.8*  MCV 78.2* 77.6* 75.7* 75.1*  PLT 299 310 429* 403*   Cardiac Enzymes: No results for input(s): CKTOTAL, CKMB, CKMBINDEX, TROPONINI in the last 168 hours. BNP (last 3 results) No results for input(s): PROBNP in the last 8760 hours. CBG: Recent Labs  Lab 11/30/19 2049 12/01/19 0047 12/01/19 0520 12/01/19 0732 12/01/19 1152  GLUCAP 177* 185* 174* 178* 199*   D-Dimer: No results for input(s): DDIMER in the last 72 hours. Hgb A1c: No results for input(s): HGBA1C in the last 72 hours. Lipid Profile: Recent Labs    11/30/19 0354  TRIG 193*   Thyroid function studies: No results for input(s): TSH, T4TOTAL, T3FREE, THYROIDAB in the last 72 hours.  Invalid input(s): FREET3 Anemia work up: No results for input(s): VITAMINB12, FOLATE, FERRITIN, TIBC, IRON, RETICCTPCT in the last 72 hours. Sepsis Labs: Recent Labs  Lab 11/25/19 0501 11/26/19 1500 11/30/19 0354 12/01/19 0515  WBC 17.0* 17.1* 16.2* 13.7*    Microbiology Recent Results (from the past 240 hour(s))  Aerobic/Anaerobic Culture (surgical/deep wound)     Status: None (Preliminary result)   Collection Time: 11/27/19 12:48 PM   Specimen: Abscess  Result Value Ref Range Status   Specimen Description   Final    ABSCESS ABDOMEN Performed at [facility], [address] [place], [place] [address]    Special Requests   Final    Normal Performed at [facility], [address] [place], [place] [address]    Gram Stain   Final    MODERATE WBC PRESENT,BOTH PMN AND MONONUCLEAR MODERATE GRAM POSITIVE RODS Performed at [facility], [address] [place], [place] [address]    Culture   Final    FEW LACTOBACILLUS SPECIES RARE CANDIDA ALBICANS NO ANAEROBES ISOLATED; CULTURE IN PROGRESS FOR 5 DAYS    Report Status PENDING  Incomplete    Procedures and diagnostic studies:  No results found.             LOS: 22 days   Ayzia Day  [employer] Chartered loss adjuster on [email or web address]. If 7PM-7AM, please contact night-coverage at [email or web address]     12/01/2019, 2:25 PM

## 2019-12-01 NOTE — Progress Notes (Signed)
Mobility Specialist - Progress Note   12/01/19 1218  Mobility  Activity Contraindicated/medical hold  Mobility performed by Mobility specialist    Per chart review, pt Hgb level is 6.7, which falls out of safety guidelines for mobility session. Will re-attempt session at a later date/time when medically appropriate.      Amiree No Mobility Specialist  12/01/19, 12:21 PM

## 2019-12-01 NOTE — Progress Notes (Signed)
Referring Physician(s): Piscoya,J  Supervising Physician: Irish Lack  Patient Status:  [facility] - In-pt  Chief Complaint:  Abdominal pain/abscesses  Subjective: Pt still with some soreness at abd drain sites; no N/V ; mother at bedside   Allergies: Penicillins and Tomato  Medications: Prior to Admission medications   Medication Sig Start Date End Date Taking? Authorizing Provider  losartan-hydrochlorothiazide (HYZAAR) 100-25 MG tablet Take 1 tablet by mouth daily. 03/26/19 03/25/20 Yes Willy Eddy, MD  azithromycin (ZITHROMAX Z-PAK) 250 MG tablet Take 2 tablets (500 mg) on  Day 1,  followed by 1 tablet (250 mg) once daily on Days 2 through 5. Patient not taking: Reported on 11/09/2019 12/06/17   Bridget Hartshorn L, PA-C  ondansetron (ZOFRAN) 4 MG tablet Take 1 tablet (4 mg total) by mouth daily as needed. Patient not taking: Reported on 11/09/2019 03/26/19 03/25/20  Willy Eddy, MD     Vital Signs: BP (!) 145/95 (BP Location: Left Wrist)   Pulse (!) 113   Temp 98.4 F (36.9 C) (Oral)   Resp (!) 22   Ht 5\' 9"  (1.753 m)   Wt (!) 436 lb 8.2 oz (198 kg)   LMP 11/11/2019   SpO2 99%   BMI 64.46 kg/m   Physical Exam awake, soft spoken; rt sided abd/TG drains intact, insertion sites sl tender, OP ranges from 20-40 cc serous/serosang fluid  Imaging: CT IMAGE GUIDED DRAINAGE BY PERCUTANEOUS CATHETER  Result Date: 11/27/2019 INDICATION: Right abdominal abscess EXAM: CT-guided 12 French drainage right abdominal abscess MEDICATIONS: The patient is currently admitted to the hospital and receiving intravenous antibiotics. The antibiotics were administered within an appropriate time frame prior to the initiation of the procedure. ANESTHESIA/SEDATION: Fentanyl 1.0 mcg IV; Versed 50 mg IV Moderate Sedation Time:  12 minutes The patient was continuously monitored during the procedure by the interventional radiology nurse under my direct supervision. COMPLICATIONS: None  immediate. PROCEDURE: Informed written consent was obtained from the patient after a thorough discussion of the procedural risks, benefits and alternatives. All questions were addressed. Maximal Sterile Barrier Technique was utilized including caps, mask, sterile gowns, sterile gloves, sterile drape, hand hygiene and skin antiseptic. A timeout was performed prior to the initiation of the procedure. Previous imaging reviewed. Patient positioned right anterior oblique. Noncontrast localization CT performed. The right abdominal abscess was localized and marked. Under sterile conditions and local anesthesia, an 18 gauge 15 cm access needle was advanced into the abscess. Needle position confirmed with CT. Syringe aspiration yielded purulent fluid. Sample sent for culture. Guidewire inserted followed by tract dilatation to insert a 12 13 drain. Drain catheter position confirmed CT. 80 cc purulent fluid aspirated. Catheter secured with Prolene suture and connected to external suction bulb. Sterile dressing applied. No immediate complication. Patient tolerated the procedure well. IMPRESSION: Successful CT-guided 12 French right abdominal abscess drain placement Electronically Signed   By: 13.  Shick M.D.   On: 11/27/2019 13:23    Labs:  CBC: Recent Labs    11/25/19 0501 11/26/19 1500 11/30/19 0354 12/01/19 0515  WBC 17.0* 17.1* 16.2* 13.7*  HGB 7.5* 7.4* 7.0* 6.7*  HCT 24.4* 23.5* 22.1* 20.8*  PLT 299 310 429* 403*    COAGS: Recent Labs    11/09/19 1758  INR 1.1  APTT 30    BMP: Recent Labs    11/28/19 0935 11/29/19 0602 11/30/19 0354 12/01/19 0515  NA 135 137 135 136  K 4.0 4.3 4.3 4.3  CL 90* 96* 95* 96*  CO2 38*  35* 33* 32  GLUCOSE 197* 178* 166* 174*  BUN 17 15 15 19   CALCIUM 7.6* 7.3* 7.5* 7.5*  CREATININE 0.51 0.56 0.54 0.52  GFRNONAA >60 >60 >60 >60  GFRAA >60 >60 >60 >60    LIVER FUNCTION TESTS: Recent Labs    11/19/19 0436 11/19/19 0436 11/23/19 0543  11/23/19 0543 11/25/19 0501 11/26/19 0642 11/27/19 0638 11/30/19 0354  BILITOT 0.8  --  0.4  --   --  0.4  --  0.5  AST 11*  --  22  --   --  24  --  24  ALT 9  --  12  --   --  20  --  29  ALKPHOS 43  --  62  --   --  83  --  137*  PROT 5.0*  --  6.5  --   --  7.5  --  7.5  ALBUMIN 1.7*   < > 1.5*   < > 1.7* 1.6* 1.5* 1.4*   < > = values in this interval not displayed.    Assessment and Plan: 34 y.o. female inpatient. History of HTN and morbid obesity, choledocholithiasis s/p cholecystectomy in 2014. Presented to the ED At [facility] with persistent abdominal pain X 2 -3 weeks with worsening in pain X 2 days and dysuria. The patient was found to have a diverticulitis with peritoneal abscess. IR placed a pelvic abscess drain via trans gluteal approach on 9.3.21. On 9.8. 21 the patient had robotic lysis of adhesions, washout and surgical drain placement to pelvic abscess.CT abdomen pelvis from 9.15.21 showed additional fluid collections and  ongoing fevers and leukocytosis. IR placed a second drain 9.17.21 (3 in total); afebrile, WBC 13.7 down from 16.2, hemoglobin 6.7 down to 7, creatinine normal; drain fluid cultures with E. coli and Streptococcus group C.;  Plans as outlined by surgery   Electronically Signed: D. 08-17-1999, PA-C 12/01/2019, 12:46 PM   I spent a total of 15 minutes at the the patient's bedside AND on the patient's hospital floor or unit, greater than 50% of which was counseling/coordinating care for abdominal/pelvic abscess drains    Patient ID: 12/03/2019, female   DOB: 04-11-1985, 34 y.o.   MRN: [id number]

## 2019-12-01 NOTE — TOC Progression Note (Signed)
Transition of Care [facility]) - Progression Note    Patient Details  Name: Amanda Reyes MRN: [id number] Date of Birth: [birth year]  [facility] CM/SW Contact  Meriel Flavors, LCSW Phone Number: 12/01/2019, 4:10 PM  Clinical Narrative:    CSW met with patient at bedside, Diane patient's mother was also present. CSW explained current recommendations at discharge is for patient to go to a skilled nursing facility for rehabilitation prior to returning home. Patient confirmed understanding, stating she had figured as much. CSW discussed options and ask if they would like to see a list in the area. Patient stated she is not familiar with any in the area and has no preference. CSW confirmed sending out patient's information to all in the area and then discussing options with bed offers. Patient and mother agreed. CSW retrieved PASRR# and completed FL2. Did not send out as per request to wait until patient is no longer on TPN        Expected Discharge Plan and Services                                                 Social Determinants of Health (SDOH) Interventions    Readmission Risk Interventions No flowsheet data found.

## 2019-12-01 NOTE — Progress Notes (Addendum)
[facility] SURGICAL PROGRESS NOTE  Hospital Day(s): 22.   Post op day(s): 13 Days Post-Op.   Interval History:  Patient seen and examined no acute events or new complaints overnight.  Patient reports she is feeling better this morning, she has abdominal soreness primarily around drain sites No nausea or emesis She did have a low grade fever of 100.5 at 2030 last night Leukocytosis continues to improve, now down to 13.7K Anemic to 6.7 this morning, likely multifactorial from dilution and anemia of chronic disease, she will likely benefit from a unit today Renal function remains normal, sCr - 0.52, UO - 4.2L Drain output not recorded, and remain serous She was started on CLD yesterday and tolerating well. Working with therapies; recommending SNF  Vital signs in last 24 hours: [min-max] current  Temp:  [98.2 F (36.8 C)-100.5 F (38.1 C)] 98.2 F (36.8 C) (09/21 0537) Pulse Rate:  [100-122] 103 (09/21 0537) Resp:  [20-24] 24 (09/21 0537) BP: (131-155)/(80-104) 137/95 (09/21 0537) SpO2:  [98 %-100 %] 100 % (09/21 0537)     Height: 5\' 9"  (175.3 cm) Weight: (!) 198 kg BMI (Calculated): 64.43   Intake/Output last 2 shifts:  09/20 0701 - 09/21 0700 In: 2292.3 [I.V.:2043.5; IV Piggyback:248.8] Out: 4275 [Urine:4275]   Physical Exam:  Constitutional: alert, cooperative and no distress Respiratory:tachypneic,breathing appears labored however this is relatively unchanged from previous,on [place], refusing BiPAP Cardiovascular:Tachycardicand sinus rhythm  Gastrointestinal:Obese, soft, no appreciable tenderness, non-distended, JP in RLQappears more serous, right flank drain is serous, RUQ drain is serosanguinous Genitourinary: Foley in place Musculoskeletal: 1+ edema to bilateral LE Integumentary:Laparoscopic incisions are CDI with dermabond, no erythema or drainage  Labs:  CBC Latest Ref Rng & Units 12/01/2019 11/30/2019 11/26/2019  WBC 4.0 - 10.5 K/uL 13.7(H)  16.2(H) 17.1(H)  Hemoglobin 12.0 - 15.0 g/dL 6.7(L) 7.0(L) 7.4(L)  Hematocrit 36 - 46 % 20.8(L) 22.1(L) 23.5(L)  Platelets 150 - 400 K/uL 403(H) 429(H) 310   CMP Latest Ref Rng & Units 12/01/2019 11/30/2019 11/29/2019  Glucose 70 - 99 mg/dL 12/01/2019) 423(N) 361(W)  BUN 6 - 20 mg/dL 19 15 15   Creatinine 0.44 - 1.00 mg/dL 431(V 4.00  Sodium 135 - 145 mmol/L 136 135 137  Potassium 3.5 - 5.1 mmol/L 4.3 4.3 4.3  Chloride 98 - 111 mmol/L 96(L) 95(L) 96(L)  CO2 22 - 32 mmol/L 32 33(H) 35(H)  Calcium 8.9 - 10.3 mg/dL 7.5(L) 7.5(L) 7.3(L)  Total Protein 6.5 - 8.1 g/dL - 7.5 -  Total Bilirubin 0.3 - 1.2 mg/dL - 0.5 -  Alkaline Phos 38 - 126 U/L - 137(H) -  AST 15 - 41 U/L - 24 -  ALT 0 - 44 U/L - 29 -     Imaging studies: No new pertinent imaging studies   Assessment/Plan:  34 y.o. female with gradual improvement, improving leukocytosis, 13 Days Post-Op s/p robotic assisted laparoscopic lysis of adhesion and drainage of intra-abdominal abscess WITHOUT evidence of perforation or frank stoolforperforated diverticulitis, complicated by hypoxic respiratory failure requiring post-operative intubation (since extubated).   - Continue CLD + Boost Breeze; go slow             - Continue TPN at goal rate             - Continue IV ABx; switched to Meropenem, andifungalin added; ID on board(Day 12) - Continue drains; I will plan on beginning sequential removal of drains tomorrow (09/22) starting with right posterior drain - No plans for surgical re-intervention at  this time - Monitor abdominal examination; on-going bowel function - Pain control prn; antiemetics prn  - Anemia likely multifactorial from dilution and chronic disease; agree she will probably benefit from 1 unit pRBCs; monitor  - Monitor leukocytosis, renal function - Home medications; continue; appreciate hospitalist consultation -Mobilization encouraged  as tolerated; PT/OT following - DVT prophylaxis  All of the above findings and recommendations were discussed with the patient, and the medical team, and all of patient's questions were answered to her expressed satisfaction.  -- Lynden Oxford, PA-C [facility] 12/01/2019, 7:38 AM [phone] M-F: 7am - 4pm

## 2019-12-02 DIAGNOSIS — K651 Peritoneal abscess: Secondary | ICD-10-CM | POA: Diagnosis not present

## 2019-12-02 DIAGNOSIS — K5792 Diverticulitis of intestine, part unspecified, without perforation or abscess without bleeding: Secondary | ICD-10-CM | POA: Diagnosis not present

## 2019-12-02 DIAGNOSIS — K5720 Diverticulitis of large intestine with perforation and abscess without bleeding: Secondary | ICD-10-CM | POA: Diagnosis not present

## 2019-12-02 LAB — GLUCOSE, CAPILLARY
Glucose-Capillary: 152 mg/dL — ABNORMAL HIGH (ref 70–99)
Glucose-Capillary: 142 mg/dL — ABNORMAL HIGH (ref 70–99)
Glucose-Capillary: 142 mg/dL — ABNORMAL HIGH (ref 70–99)

## 2019-12-02 LAB — AEROBIC/ANAEROBIC CULTURE W GRAM STAIN (SURGICAL/DEEP WOUND): Special Requests: NORMAL

## 2019-12-02 LAB — CBC
HCT: 24.5 % — ABNORMAL LOW (ref 36.0–46.0)
Hemoglobin: 7.6 g/dL — ABNORMAL LOW (ref 12.0–15.0)
MCH: 24.9 pg — ABNORMAL LOW (ref 26.0–34.0)
MCHC: 31 g/dL (ref 30.0–36.0)
MCV: 80.3 fL (ref 80.0–100.0)
Platelets: 407 10*3/uL — ABNORMAL HIGH (ref 150–400)
RBC: 3.05 MIL/uL — ABNORMAL LOW (ref 3.87–5.11)
RDW: 20.1 % — ABNORMAL HIGH (ref 11.5–15.5)
WBC: 13 10*3/uL — ABNORMAL HIGH (ref 4.0–10.5)
nRBC: 0.2 % (ref 0.0–0.2)

## 2019-12-02 MED ORDER — ENSURE ENLIVE PO LIQD
237.0000 mL | Freq: Three times a day (TID) | ORAL | Status: DC
  Administered 2019-12-02 – 2019-12-23 (×47): 237 mL via ORAL

## 2019-12-02 MED ORDER — HYDROMORPHONE HCL 1 MG/ML IJ SOLN
0.5000 mg | INTRAMUSCULAR | Status: DC | PRN
  Administered 2019-12-02 – 2019-12-06 (×28): 1 mg via INTRAVENOUS
  Administered 2019-12-07: 0.5 mg via INTRAVENOUS
  Administered 2019-12-07 (×3): 1 mg via INTRAVENOUS
  Administered 2019-12-07: 0.5 mg via INTRAVENOUS
  Administered 2019-12-08 – 2019-12-14 (×32): 1 mg via INTRAVENOUS
  Administered 2019-12-14: 0.5 mg via INTRAVENOUS
  Administered 2019-12-14 (×2): 1 mg via INTRAVENOUS
  Administered 2019-12-14: 0.5 mg via INTRAVENOUS
  Administered 2019-12-15 – 2019-12-20 (×35): 1 mg via INTRAVENOUS
  Filled 2019-12-02 (×105): qty 1

## 2019-12-02 MED ORDER — TRACE MINERALS CU-MN-SE-ZN 300-55-60-3000 MCG/ML IV SOLN
INTRAVENOUS | Status: AC
  Filled 2019-12-02: qty 936

## 2019-12-02 NOTE — Consult Note (Signed)
PHARMACY - TOTAL PARENTERAL NUTRITION CONSULT NOTE   Indication: Prolonged ileus  Patient Measurements: Height: 5\' 9"  (175.3 cm) Weight: (!) 198 kg (436 lb 8.2 oz) IBW/kg (Calculated) : 66.2 TPN AdjBW (KG): 90.5 Body mass index is 64.46 kg/m.  Assessment: 34 year old female with PMHx of HTN admitted with concern for perforated diverticulitis with contained abscesses. Patient is POD # 14 from laparoscopic lysis of adhesions and drainage of intra-abdominal abscess. There was no evidence of perforation. Procedure complicated by hypoxic respiratory failure requiring intubation. Patient extubated 9/10. No longer requiring blood pressure support. Since the previous note she has been re-assessed for calories and nutrition goals.  Glucose / Insulin: A1C 5.7, BG range 146 - 199: 12 units SSI required.  Electrolytes: Cl:Ac ratio improving  Cl trending down now, CO2 trending up now  Renal: SCr < 1, stable  (good UOP) Renal function remains normal, SCr - 0.52, good UO - 2.2L LFTs / TGs: LFTs WNL at baseline, TG 69-->193 Prealbumin / albumin: <5 / 1.5 Intake / Output; MIVF: Continue to monitor I&Os and needfor intermittent Lasix as tolerated to maintain euvolemia.  GI Imaging: 9/7 CT abdomen pelvis: colonic perforation related to diverticulitis with worsening fluid and gas containing abscesses is throughout the low abdomen and pelvis many with interloop and mesenteric involvement, also with multiple areas of pelvic fluid as well showing generalized inflammation tracking to the mid abdomen. Interval decompression of the abscess in the cul-de-sac following drain placement. Extensive body wall edema.  9/15 CT concerning for perihepatic fluid collection and possible abscess  Surgeries / Procedures:  9/8 Robotic assisted laparoscopic lysis of adhesions and drainage of intra-abdominal abscess  9/17 IR placement of drain in perihepatic fluid collection   Central access: 11/19/19 TPN start date:  11/19/19  Nutritional Goals  kCal: 2700 - 3000 kCal/day, Protein: >135 g/day, Fluid: > 2000 mL Goal TPN rate is 90 mL/hr (provides 140 g of protein and 2999 kcals per day)  Current Nutrition:  She was started on CLD yesterday and tolerating well  Plan:   Continue TPN rate of 90 mL/hr at 1800 using concentrated amino acids  Nutritional components: 21% dextrose, 65 g/L amino acids, 41.5 g/L lipids, 2999 kCal  Electrolytes in TPN: 75mEq/L of Na, 35mEq/L of K, 56mEq/L of Ca, 20mEq/L of Mg, phosphorous 15 mmol/L, continue Cl:Ac ratio at max (currently 1 : 1.04)  Add standard MVI and trace elements to TPN  Continue resistant SSI to q6h SSI and continue to adjust as needed - continue insulin to 50 units insulin regular in TPN  Fluid amount 4m  Monitor TPN labs daily until stable then on Mon/Thurs, next in am  12/02/2019 7:00 AM

## 2019-12-02 NOTE — Progress Notes (Signed)
   Date of Admission:  11/09/2019  11/18/19 laparotomy Antibiotic Meropenem 9/9> anidulaungin 9/9 >9/17 Fluconazole 11/27/19   ID: Amanda Reyes is a 34 y.o. female Active Problems:   Diverticulitis of colon with perforation   Diverticulitis of large intestine with perforation and abscess  PCN allergy  Buttock drain 11/13/19 RLQ 11/18/19 RUQ drain 11/27/19 PICC 11/19/19 Foley 11/18/19  Subjective: Pt is awake Says the posterior drain was removed today   Medications:  . sodium chloride   Intravenous Once  . Chlorhexidine Gluconate Cloth  6 each Topical Q0600  . enoxaparin (LOVENOX) injection  40 mg Subcutaneous Q12H  . feeding supplement (ENSURE ENLIVE)  237 mL Oral TID BM  . insulin aspart  0-20 Units Subcutaneous Q6H  . mouth rinse  15 mL Mouth Rinse q12n4p  . pantoprazole (PROTONIX) IV  40 mg Intravenous Q12H  . sodium chloride flush  10-40 mL Intracatheter Q12H  . sodium chloride flush  5 mL Intracatheter Q8H  . sodium chloride flush  5 mL Intracatheter Q8H    Objective: Vital signs in last 24 hours: Temp:  [98.4 F (36.9 C)-100 F (37.8 C)] 98.6 F (37 C) (09/22 1124) Pulse Rate:  [104-111] 104 (09/22 1124) Resp:  [20] 20 (09/22 1124) BP: (127-149)/(85-101) 141/100 (09/22 1124) SpO2:  [92 %-99 %] 98 % (09/22 1124)  PHYSICAL EXAM:  General:awake, always in bed, no distress, appears stated age.  Head: Normocephalic, without obvious abnormality, atraumatic. Eyes: Conjunctivae clear, anicteric sclerae. Pupils are equal ENT Nares normal. No drainage or sinus tenderness. Tongue coated Neck: Supple, Lungs: b/l air entry Decreased bases Heart: Regular rate and rhythm, no murmur, rub or gallop. Abdomen: Soft, rt upper quadrant drain Left lower quadrant drain Extremities: atraumatic, no cyanosis. No edema. No clubbing Skin: No rashes or lesions. Or bruising Lymph: Cervical, supraclavicular normal. Neurologic: Grossly non-focal  Lab Results CBC Latest Ref Rng &  Units 12/02/2019 12/01/2019 11/30/2019  WBC 4.0 - 10.5 K/uL 13.0(H) 13.7(H) 16.2(H)  Hemoglobin 12.0 - 15.0 g/dL 7.6(L) 6.7(L) 7.0(L)  Hematocrit 36 - 46 % 24.5(L) 20.8(L) 22.1(L)  Platelets 150 - 400 K/uL 407(H) 403(H) 429(H)   Liver Panel Recent Labs    11/30/19 0354  PROT 7.5  ALBUMIN 1.4*  AST 24  ALT 29  ALKPHOS 137*  BILITOT 0.5    Assessment/Plan: Perforated diverticulitis with extensive intra-abdominal loop abscesses Status post laparoscopy and washout.on 11/18/19 CT abdomen done9/16shows an abscess along rtlateral margin liverand organizing abscesses central abdominal mesentry - . IRplaced another drain 9/17.Culture lactobacillus and rare candida albicans - meropenem is the rx as she has amoxicillin allergy Continue fluconazole She needs to participate in PT Discussed with patient and mother and pharmacist

## 2019-12-02 NOTE — Progress Notes (Signed)
Pharmacy Antibiotic Note  Amanda Reyes is a 34 y.o. female admitted on 11/09/2019 with diverticulitis of colon with perforation. Patient day 14 s/p laparoscopic lysis of adhesions and drainage of intra-abdominal abscess with further placement of 3 drains. Pharmacy has been consulted for meropenem and fluconazole dosing. ID is following.   Today, 9/22:  Day #14 Meropenem day #19 abx Day #5 fluconazole 800mg  daily (dosed considering weight) Leukocytosis remains present (WBC 13 on 9/22)  Afebrile since 9/8  IR to serially remove drains. Output --- Right posterior: 35 ccs  Right lower quadrant: 45 ccs  Right upper quadrant: 60 ccs    Cultures:  Abscess 9/17: Lactobacillus and Candida Albicans Abscess 9/3: E. Coli & Streptococcus Group C, pan-sensitive  BCx 8/30: NG, final UCx 8/30: NG, final   Scr: 0.52 (9/21), good UOP  Baseline QTc 8/30: 390    Plan: Continue meropenem 1g every 8 hours and fluconazole 800mg  every 24 hours until adequate source control is achieved. Continue to monitor renal function daily throughout therapy. Monitor LFTs weekly throughout therapy with fluconazole.   Height: 5\' 9"  (175.3 cm) Weight: (!) 198 kg (436 lb 8.2 oz) IBW/kg (Calculated) : 66.2  Temp (24hrs), Avg:98.9 F (37.2 C), Min:98.4 F (36.9 C), Max:100 F (37.8 C)  Recent Labs  Lab 11/26/19 0642 11/26/19 1500 11/27/19 0638 11/28/19 0935 11/29/19 0602 11/30/19 0354 12/01/19 0515 12/02/19 1110  WBC  --  17.1*  --   --   --  16.2* 13.7* 13.0*  CREATININE   < >  --  0.58 0.51 0.56 0.54 0.52  --    < > = values in this interval not displayed.    Estimated Creatinine Clearance: 187.7 mL/min (by C-G formula based on SCr of 0.52 mg/dL).    Allergies  Allergen Reactions  . Penicillins Hives    Did it involve swelling of the face/tongue/throat, SOB, or low BP? No Did it involve sudden or severe rash/hives, skin peeling, or any reaction on the inside of your mouth or nose? Yes Did you need  to seek medical attention at a hospital or doctor's office? No When did it last happen? If all above answers are "NO", may proceed with cephalosporin use..3  . Tomato Rash    Antimicrobials this admission: Meropenem 9/9 >> Fluconazole 9/18>> Anidulagungin 9/9 >>9/17 Metronidazole 8/30>>9/8  Ciprofloxacin 8/30>>9/8     Thank you for allowing pharmacy to be a part of this patient's care.  11/9 12/02/2019 2:38 PM

## 2019-12-02 NOTE — Progress Notes (Signed)
[facility] SURGICAL PROGRESS NOTE  Hospital Day(s): 23.   Post op day(s): 14 Days Post-Op.   Interval History:  Patient seen and examined  no acute events or new complaints overnight.  Patient sitting up in bed, she looks the best Ive seen She denied any abdominal pain aside from at drain sites No fever, chills, nausea, emesis  CBC pending this morning, she did get 1 unit pRBCs yesterday (09/21) Drain output as follows:   - Right Posterior: 35 ccs, serous  - RLQ: 45 ccs, serous  - RUQ: 60 ccs, serous Most recent RUQ drain cultures with lactobacillus, rare candida albicans Continues on Meropenem and Andifungalin She was started on CLD; tolerating Having bowel function Working with therapies; recommending SNF   Vital signs in last 24 hours: [min-max] current  Temp:  [98.4 F (36.9 C)-100 F (37.8 C)] 99.4 F (37.4 C) (09/22 0601) Pulse Rate:  [106-113] 106 (09/22 0601) Resp:  [20-22] 20 (09/22 0601) BP: (127-149)/(85-101) 134/101 (09/22 0601) SpO2:  [92 %-99 %] 92 % (09/22 0601)     Height: 5\' 9"  (175.3 cm) Weight: (!) 198 kg BMI (Calculated): 64.43   Intake/Output last 2 shifts:  09/21 0701 - 09/22 0700 In: 5192.1 [P.O.:237; I.V.:3393; Blood:772; IV Piggyback:750.1] Out: 2405 [Urine:2175; Drains:140; Stool:90]   Physical Exam:  Constitutional: alert, cooperative and no distress Respiratory:tachypneic,breathing appears labored however this is relatively unchanged from previous,on [place], refusing BiPAP Cardiovascular:Tachycardicand sinus rhythm  Gastrointestinal:Obese, soft, no appreciable tenderness, non-distended, JP in RLQappears more serous, right flank drain is serous, RUQ drain is serous. Rectal tube in place Genitourinary: Foley in place Musculoskeletal: 1+ edema to bilateral LE Integumentary:Laparoscopic incisions are CDI with dermabond, no erythema or drainage  Labs:  CBC Latest Ref Rng & Units 12/01/2019 11/30/2019 11/26/2019  WBC 4.0 -  10.5 K/uL 13.7(H) 16.2(H) 17.1(H)  Hemoglobin 12.0 - 15.0 g/dL 6.7(L) 7.0(L) 7.4(L)  Hematocrit 36 - 46 % 20.8(L) 22.1(L) 23.5(L)  Platelets 150 - 400 K/uL 403(H) 429(H) 310   CMP Latest Ref Rng & Units 12/01/2019 11/30/2019 11/29/2019  Glucose 70 - 99 mg/dL 12/01/2019) 665(L) 935(T)  BUN 6 - 20 mg/dL 19 15 15   Creatinine 0.44 - 1.00 mg/dL 017(B 9.39  Sodium 135 - 145 mmol/L 136 135 137  Potassium 3.5 - 5.1 mmol/L 4.3 4.3 4.3  Chloride 98 - 111 mmol/L 96(L) 95(L) 96(L)  CO2 22 - 32 mmol/L 32 33(H) 35(H)  Calcium 8.9 - 10.3 mg/dL 7.5(L) 7.5(L) 7.3(L)  Total Protein 6.5 - 8.1 g/dL - 7.5 -  Total Bilirubin 0.3 - 1.2 mg/dL - 0.5 -  Alkaline Phos 38 - 126 U/L - 137(H) -  AST 15 - 41 U/L - 24 -  ALT 0 - 44 U/L - 29 -    Imaging studies: No new pertinent imaging studies   Assessment/Plan:  34 y.o. female 14 Days Post-Op s/p robotic assisted laparoscopic lysis of adhesion and drainage of intra-abdominal abscess WITHOUT evidence of perforation or frank stoolforperforated diverticulitis, complicated by hypoxic respiratory failure requiring post-operative intubation(since extubated).   - I will trial her on full liquids today, continue nutritional supplementation - Continue TPN at goal rate; she has significant malnutrition and will continue at goal rate until certain tolerating adequate PO  - Continue IV ABx; switched to Meropenem, andifungalin added; ID on board(Day 13) - Continue drains; I will plan on beginning sequential removal of drains today (09/22) starting with right posterior drain. RUQ/RLQ in no rush to remove, may benefit from re-imaging  prior to removing these, it is encouraging that the output has cleared - No plans for surgical re-intervention at this time - Monitor abdominal examination; on-going bowel function - Pain control prn; antiemetics prn             - Monitor H&H; s/p transfusion 1 unit pRBC on  09/21 - Monitor leukocytosis, renal function  - Once patient can better ambulate to commode/bathroom then we can consider removing rectal tube/foley  - Home medications; continue; appreciate hospitalist consultation -Mobilization encouraged as tolerated; PT/OT following - DVT prophylaxis  All of the above findings and recommendations were discussed with the patient, and the medical team, and all of patient's questions were answered to her expressed satisfaction.  -- Lynden Oxford, PA-C [facility] 12/02/2019, 7:32 AM [phone] M-F: 7am - 4pm

## 2019-12-02 NOTE — Progress Notes (Addendum)
PROGRESS NOTE    Amanda Reyes  ZOX:[id number] DOB: 11-21-1985 DOA: 11/09/2019 PCP: Patient, No Pcp Per  Brief Narrative:  34 year old female 14 days postop status post operative drainage of intra-abdominal abscesses.  No evidence of perforation or frank stool.  Postoperative course complicated by hypoxic respiratory failure requiring intubation.  Now extubated and appears to be improving.  Patient is surgery primary.  Medicine on consult.   Assessment & Plan:   Active Problems:   Diverticulitis of colon with perforation   Diverticulitis of large intestine with perforation and abscess  Hypoxic and hypercarbic respiratory failure Obesity hypoventilation syndrome Respiratory status at or near baseline Requiring 2L oxygen Mental status at baseline Plan: Continue nightly BiPAP and as needed Supplemental oxygen as needed As needed nebs  Intra-abdominal abscesses status post laparoscopic lysis of adhesions and drainage Status post drain placement initially on 11/18/2019 Drain upsized with IR on 11/27/2019 Drains with serous output Patient clinically improving Currently on meropenem and fluconazole Has been on TPN Plan: Continue current antibiotics, meropenem and fluconazole ID following, recommendations appreciated Remainder of care per surgical primary team On TPN per surgery  Type 2 diabetes mellitus with hyperglycemia Sugars appear controlled over interval Continue sliding-scale coverage  Acute kidney injury Resolved Avoid nephrotoxins  Hypertension Appears intermittent.  Could be pain related Overall well controlled Labetalol as needed on board   [facility] hospitalist service will continue to follow this patient with you.  Please feel free to reach out with any questions or concerns  DVT prophylaxis: Lovenox, 40 mg subcu every 12 hours Code Status: Full Family Communication: None today Disposition Plan: Per general surgery.  Medicine on consult   Consultants:    Medicine  Infectious disease  Procedures:   Laparoscopic lysis of adhesions and drainage of intra-abdominal abscess  Drain upsizing  Antimicrobials:   Meropenem  Fluconazole   Subjective: Seen and examined.  Complains of some fatigue and immobility this morning  Objective: Vitals:   12/01/19 1900 12/01/19 2007 12/02/19 0601 12/02/19 1124  BP: (!) 149/100 129/90 (!) 134/101 (!) 141/100  Pulse: (!) 109 (!) 110 (!) 106 (!) 104  Resp:  20 20 20   Temp: 98.6 F (37 C) 98.4 F (36.9 C) 99.4 F (37.4 C) 98.6 F (37 C)  TempSrc: Oral Oral Oral Oral  SpO2: 97% 99% 92% 98%  Weight:      Height:        Intake/Output Summary (Last 24 hours) at 12/02/2019 1221 Last data filed at 12/02/2019 0959 Gross per 24 hour  Intake 4932.27 ml  Output 2000 ml  Net 2932.27 ml   Filed Weights   11/26/19 1541 11/29/19 0500 11/30/19 0500  Weight: (!) 197 kg (!) 198 kg (!) 198 kg    Examination:  General exam: No acute distress. Respiratory system: Decreased at bases.  Normal work of breathing.  2 L cardiovascular system: Tachycardic, regular rhythm, no murmurs Gastrointestinal system: Obese, nondistended.  Tender to palpation epigastrium.  Positive bowel sounds.  Abdominal drain in place Central nervous system: Alert and oriented. No focal neurological deficits. Extremities: Diffusely decreased muscle strength bilaterally Skin: No rashes, lesions or ulcers Psychiatry: Judgement and insight appear normal. Mood & affect appropriate.     Data Reviewed: I have personally reviewed following labs and imaging studies  CBC: Recent Labs  Lab 11/26/19 1500 11/30/19 0354 12/01/19 0515 12/02/19 1110  WBC 17.1* 16.2* 13.7* 13.0*  NEUTROABS  --  12.4*  --   --   HGB 7.4* 7.0* 6.7*  7.6*  HCT 23.5* 22.1* 20.8* 24.5*  MCV 77.6* 75.7* 75.1* 80.3  PLT 310 429* 403* 407*   Basic Metabolic Panel: Recent Labs  Lab 11/26/19 0642 11/26/19 0642 11/27/19 0638 11/28/19 0935 11/29/19 0602  11/30/19 0354 12/01/19 0515  NA 139   < > 137 135 137 135 136  K 4.5   < > 4.6 4.0 4.3 4.3 4.3  CL 94*   < > 93* 90* 96* 95* 96*  CO2 37*   < > 37* 38* 35* 33* 32  GLUCOSE 180*   < > 185* 197* 178* 166* 174*  BUN 16   < > 16 17 15 15 19   CREATININE 0.56   < > 0.58 0.51 0.56 0.54 0.52  CALCIUM 7.7*   < > 7.7* 7.6* 7.3* 7.5* 7.5*  MG 2.0  --  2.0  --   --  1.9  --   PHOS 3.3  --  3.1  --   --  2.9  --    < > = values in this interval not displayed.   GFR: Estimated Creatinine Clearance: 187.7 mL/min (by C-G formula based on SCr of 0.52 mg/dL). Liver Function Tests: Recent Labs  Lab 11/26/19 0642 11/27/19 0638 11/30/19 0354  AST 24  --  24  ALT 20  --  29  ALKPHOS 83  --  137*  BILITOT 0.4  --  0.5  PROT 7.5  --  7.5  ALBUMIN 1.6* 1.5* 1.4*   No results for input(s): LIPASE, AMYLASE in the last 168 hours. No results for input(s): AMMONIA in the last 168 hours. Coagulation Profile: No results for input(s): INR, PROTIME in the last 168 hours. Cardiac Enzymes: No results for input(s): CKTOTAL, CKMB, CKMBINDEX, TROPONINI in the last 168 hours. BNP (last 3 results) No results for input(s): PROBNP in the last 8760 hours. HbA1C: No results for input(s): HGBA1C in the last 72 hours. CBG: Recent Labs  Lab 12/01/19 1152 12/01/19 1745 12/01/19 2353 12/02/19 0637 12/02/19 1125  GLUCAP 199* 146* 154* 152* 142*   Lipid Profile: Recent Labs    11/30/19 0354  TRIG 193*   Thyroid Function Tests: No results for input(s): TSH, T4TOTAL, FREET4, T3FREE, THYROIDAB in the last 72 hours. Anemia Panel: No results for input(s): VITAMINB12, FOLATE, FERRITIN, TIBC, IRON, RETICCTPCT in the last 72 hours. Sepsis Labs: No results for input(s): PROCALCITON, LATICACIDVEN in the last 168 hours.  Recent Results (from the past 240 hour(s))  Aerobic/Anaerobic Culture (surgical/deep wound)     Status: None   Collection Time: 11/27/19 12:48 PM   Specimen: Abscess  Result Value Ref Range  Status   Specimen Description   Final    ABSCESS ABDOMEN Performed at [facility], [address], [place], [place]    Special Requests   Final    Normal Performed at [facility], [address] [place], [place]    Gram Stain   Final    MODERATE WBC PRESENT,BOTH PMN AND MONONUCLEAR MODERATE GRAM POSITIVE RODS    Culture   Final    FEW LACTOBACILLUS SPECIES RARE CANDIDA ALBICANS NO ANAEROBES ISOLATED Performed at [facility], [address], [place], [place]    Report Status 12/02/2019 FINAL  Final         Radiology Studies: No results found.      Scheduled Meds: . sodium chloride   Intravenous Once  . Chlorhexidine Gluconate Cloth  6 each Topical Q0600  .  enoxaparin (LOVENOX) injection  40 mg Subcutaneous Q12H  . feeding supplement  1 Container Oral TID BM  . insulin aspart  0-20 Units Subcutaneous Q6H  . mouth rinse  15 mL Mouth Rinse q12n4p  . pantoprazole (PROTONIX) IV  40 mg Intravenous Q12H  . sodium chloride flush  10-40 mL Intracatheter Q12H  . sodium chloride flush  5 mL Intracatheter Q8H  . sodium chloride flush  5 mL Intracatheter Q8H   Continuous Infusions: . sodium chloride Stopped (12/02/19 0652)  . sodium chloride    . sodium chloride Stopped (11/28/19 1707)  . fluconazole (DIFLUCAN) IV Stopped (12/01/19 2155)  . meropenem (MERREM) IV 200 mL/hr at 12/02/19 0658  . TPN ADULT (ION) 90 mL/hr at 12/02/19 0658  . TPN ADULT (ION)       LOS: 23 days    Time spent: 25 minutes    Tresa Moore, MD [facility] Pager [phone]  If 7PM-7AM, please contact night-coverage 12/02/2019, 12:21 PM

## 2019-12-02 NOTE — Progress Notes (Signed)
Patient declined BIPAP QHS at this time. Has not been on Bipap QHS since September 16th. Patient on 2L [place] tolerating well at this time. Patient denies shortness of breath. Bipap on standby at patient bedside.

## 2019-12-03 ENCOUNTER — Telehealth: Payer: Self-pay | Admitting: *Deleted

## 2019-12-03 DIAGNOSIS — K5720 Diverticulitis of large intestine with perforation and abscess without bleeding: Secondary | ICD-10-CM | POA: Diagnosis not present

## 2019-12-03 LAB — BPAM RBC
Blood Product Expiration Date: 202110202359
ISSUE DATE / TIME: 202109211547
Unit Type and Rh: 6200

## 2019-12-03 LAB — COMPREHENSIVE METABOLIC PANEL
ALT: 68 U/L — ABNORMAL HIGH (ref 0–44)
AST: 46 U/L — ABNORMAL HIGH (ref 15–41)
Albumin: 1.4 g/dL — ABNORMAL LOW (ref 3.5–5.0)
Alkaline Phosphatase: 171 U/L — ABNORMAL HIGH (ref 38–126)
Anion gap: 9 (ref 5–15)
BUN: 17 mg/dL (ref 6–20)
CO2: 31 mmol/L (ref 22–32)
Calcium: 7.8 mg/dL — ABNORMAL LOW (ref 8.9–10.3)
Chloride: 95 mmol/L — ABNORMAL LOW (ref 98–111)
Creatinine, Ser: 0.5 mg/dL (ref 0.44–1.00)
GFR calc Af Amer: >60 mL/min (ref >60)
GFR calc non Af Amer: >60 mL/min (ref >60)
Glucose, Bld: 111 mg/dL — ABNORMAL HIGH (ref 70–99)
Potassium: 4.9 mmol/L (ref 3.5–5.1)
Sodium: 135 mmol/L (ref 135–145)
Total Bilirubin: 0.5 mg/dL (ref 0.3–1.2)
Total Protein: 7.9 g/dL (ref 6.5–8.1)

## 2019-12-03 LAB — GLUCOSE, CAPILLARY
Glucose-Capillary: 105 mg/dL — ABNORMAL HIGH (ref 70–99)
Glucose-Capillary: 155 mg/dL — ABNORMAL HIGH (ref 70–99)
Glucose-Capillary: 158 mg/dL — ABNORMAL HIGH (ref 70–99)
Glucose-Capillary: 160 mg/dL — ABNORMAL HIGH (ref 70–99)
Glucose-Capillary: 181 mg/dL — ABNORMAL HIGH (ref 70–99)

## 2019-12-03 LAB — TYPE AND SCREEN
ABO/RH(D): A POS
Antibody Screen: NEGATIVE
Unit division: 0

## 2019-12-03 LAB — PHOSPHORUS: Phosphorus: 3.8 mg/dL (ref 2.5–4.6)

## 2019-12-03 LAB — MAGNESIUM: Magnesium: 2.1 mg/dL (ref 1.7–2.4)

## 2019-12-03 MED ORDER — TRACE MINERALS CU-MN-SE-ZN 300-55-60-3000 MCG/ML IV SOLN
INTRAVENOUS | Status: AC
  Filled 2019-12-03: qty 936

## 2019-12-03 MED ORDER — ACETAMINOPHEN 325 MG PO TABS
650.0000 mg | ORAL_TABLET | Freq: Four times a day (QID) | ORAL | Status: DC | PRN
  Administered 2019-12-03 – 2019-12-20 (×5): 650 mg via ORAL
  Filled 2019-12-03 (×5): qty 2

## 2019-12-03 MED ORDER — FLUCONAZOLE IN SODIUM CHLORIDE 400-0.9 MG/200ML-% IV SOLN
400.0000 mg | INTRAVENOUS | Status: DC
  Administered 2019-12-03 – 2019-12-05 (×3): 400 mg via INTRAVENOUS
  Filled 2019-12-03 (×5): qty 200

## 2019-12-03 MED ORDER — FLUCONAZOLE IN SODIUM CHLORIDE 400-0.9 MG/200ML-% IV SOLN
400.0000 mg | INTRAVENOUS | Status: DC
  Administered 2019-12-03 – 2019-12-06 (×4): 400 mg via INTRAVENOUS
  Filled 2019-12-03 (×6): qty 200

## 2019-12-03 NOTE — Progress Notes (Signed)
PROGRESS NOTE    NELY DEDMON  NWG:[id number] DOB: 05-08-1985 DOA: 11/09/2019 PCP: Patient, No Pcp Per  Brief Narrative:  34 year old female 14 days postop status post operative drainage of intra-abdominal abscesses.  No evidence of perforation or frank stool.  Postoperative course complicated by hypoxic respiratory failure requiring intubation.  Now extubated and appears to be improving.  Patient is surgery primary.  Medicine on consult. Advance to soft diet today per general surgery.  Patient having bowel function.    Assessment & Plan:   Active Problems:   Diverticulitis of colon with perforation   Diverticulitis of large intestine with perforation and abscess  Transaminitis Mild elevation in LFTs compared to 11/30/2019 Likely due to TPN Continue to trend LFTs Avoid hepatotoxins if possible Recommended decreasing dose of Tylenol to 650 mg every 6 hours as needed  Hypoxic and hypercarbic respiratory failure Obesity hypoventilation syndrome Respiratory status at or near baseline Requiring 2L oxygen Mental status at baseline Plan: Continue nightly BiPAP and as needed Supplemental oxygen as needed As needed nebs  Intra-abdominal abscesses status post laparoscopic lysis of adhesions and drainage Status post drain placement initially on 11/18/2019 Drain upsized with IR on 11/27/2019 Drains with serous output Patient clinically improving Currently on meropenem and fluconazole Has been on TPN Plan: Continue current antibiotics, meropenem and fluconazole ID following, recommendations appreciated Remainder of care per surgical primary team On TPN per surgery  Type 2 diabetes mellitus with hyperglycemia Sugars well controlled over interval Goal range 140-180 Continue sliding scale coverage Consider additional low-dose Lantus if glycemic control becomes an issue   Acute kidney injury Resolved Avoid nephrotoxins  Hypertension Appears intermittent.  Could be pain  related Overall well controlled Labetalol as needed on board Consider adding amlodipine 5 mg if blood pressure control becomes an issue   [facility] hospitalist service will continue to follow this patient with you.  Please feel free to reach out with any questions or concerns  DVT prophylaxis: Lovenox, 40 mg subcu every 12 hours Code Status: Full Family Communication: None today Disposition Plan: Per general surgery.  Medicine on consult   Consultants:   Medicine  Infectious disease  Procedures:   Laparoscopic lysis of adhesions and drainage of intra-abdominal abscess  Drain upsizing  Antimicrobials:   Meropenem  Fluconazole   Subjective: Seen and examined.  Complains of some fatigue and immobility this morning  Objective: Vitals:   12/02/19 2148 12/03/19 0109 12/03/19 0423 12/03/19 0532  BP: (!) 147/108 121/81  (!) 144/95  Pulse: (!) 107   (!) 107  Resp: 20   20  Temp: 98.2 F (36.8 C)   98.6 F (37 C)  TempSrc: Oral   Oral  SpO2: 100%   100%  Weight:   (!) 198 kg   Height:        Intake/Output Summary (Last 24 hours) at 12/03/2019 1143 Last data filed at 12/03/2019 0552 Gross per 24 hour  Intake 2684.41 ml  Output 1997.5 ml  Net 686.91 ml   Filed Weights   11/29/19 0500 11/30/19 0500 12/03/19 0423  Weight: (!) 198 kg (!) 198 kg (!) 198 kg    Examination:  General exam: No acute distress. Respiratory system: Decreased at bases.  Normal work of breathing.  2 L cardiovascular system: Tachycardic, regular rhythm, no murmurs Gastrointestinal system: Obese, nondistended.  Tender to palpation epigastrium.  Positive bowel sounds.  Abdominal drain in place Central nervous system: Alert and oriented. No focal neurological deficits. Extremities: Diffusely decreased muscle strength bilaterally  Skin: No rashes, lesions or ulcers Psychiatry: Judgement and insight appear normal. Mood & affect appropriate.     Data Reviewed: I have personally reviewed following  labs and imaging studies  CBC: Recent Labs  Lab 11/26/19 1500 11/30/19 0354 12/01/19 0515 12/02/19 1110  WBC 17.1* 16.2* 13.7* 13.0*  NEUTROABS  --  12.4*  --   --   HGB 7.4* 7.0* 6.7* 7.6*  HCT 23.5* 22.1* 20.8* 24.5*  MCV 77.6* 75.7* 75.1* 80.3  PLT 310 429* 403* 407*   Basic Metabolic Panel: Recent Labs  Lab 11/27/19 0638 11/27/19 1572 11/28/19 0935 11/29/19 0602 11/30/19 0354 12/01/19 0515 12/03/19 0415  NA 137   < > 135 137 135 136 135  K 4.6   < > 4.0 4.3 4.3 4.3 4.9  CL 93*   < > 90* 96* 95* 96* 95*  CO2 37*   < > 38* 35* 33* 32 31  GLUCOSE 185*   < > 197* 178* 166* 174* 111*  BUN 16   < > 17 15 15 19 17   CREATININE 0.58   < > 0.51 0.56 0.54 0.52 0.50  CALCIUM 7.7*   < > 7.6* 7.3* 7.5* 7.5* 7.8*  MG 2.0  --   --   --  1.9  --  2.1  PHOS 3.1  --   --   --  2.9  --  3.8   < > = values in this interval not displayed.   GFR: Estimated Creatinine Clearance: 187.7 mL/min (by C-G formula based on SCr of 0.5 mg/dL). Liver Function Tests: Recent Labs  Lab 11/27/19 0638 11/30/19 0354 12/03/19 0415  AST  --  24 46*  ALT  --  29 68*  ALKPHOS  --  137* 171*  BILITOT  --  0.5 0.5  PROT  --  7.5 7.9  ALBUMIN 1.5* 1.4* 1.4*   No results for input(s): LIPASE, AMYLASE in the last 168 hours. No results for input(s): AMMONIA in the last 168 hours. Coagulation Profile: No results for input(s): INR, PROTIME in the last 168 hours. Cardiac Enzymes: No results for input(s): CKTOTAL, CKMB, CKMBINDEX, TROPONINI in the last 168 hours. BNP (last 3 results) No results for input(s): PROBNP in the last 8760 hours. HbA1C: No results for input(s): HGBA1C in the last 72 hours. CBG: Recent Labs  Lab 12/02/19 1125 12/02/19 1814 12/03/19 0024 12/03/19 0524 12/03/19 1132  GLUCAP 142* 142* 181* 105* 155*   Lipid Profile: No results for input(s): CHOL, HDL, LDLCALC, TRIG, CHOLHDL, LDLDIRECT in the last 72 hours. Thyroid Function Tests: No results for input(s): TSH, T4TOTAL,  FREET4, T3FREE, THYROIDAB in the last 72 hours. Anemia Panel: No results for input(s): VITAMINB12, FOLATE, FERRITIN, TIBC, IRON, RETICCTPCT in the last 72 hours. Sepsis Labs: No results for input(s): PROCALCITON, LATICACIDVEN in the last 168 hours.  Recent Results (from the past 240 hour(s))  Aerobic/Anaerobic Culture (surgical/deep wound)     Status: None   Collection Time: 11/27/19 12:48 PM   Specimen: Abscess  Result Value Ref Range Status   Specimen Description   Final    ABSCESS ABDOMEN Performed at [facility], [address] [place], [place]    Special Requests   Final    Normal Performed at [facility], [address] [place], [place]    Gram Stain   Final    MODERATE WBC PRESENT,BOTH PMN AND MONONUCLEAR MODERATE GRAM POSITIVE RODS    Culture   Final  FEW LACTOBACILLUS SPECIES RARE CANDIDA ALBICANS NO ANAEROBES ISOLATED Performed at [facility], [address] [place], [place] [address]    Report Status 12/02/2019 FINAL  Final         Radiology Studies: No results found.      Scheduled Meds: . sodium chloride   Intravenous Once  . Chlorhexidine Gluconate Cloth  6 each Topical Q0600  . enoxaparin (LOVENOX) injection  40 mg Subcutaneous Q12H  . feeding supplement (ENSURE ENLIVE)  237 mL Oral TID BM  . insulin aspart  0-20 Units Subcutaneous Q6H  . mouth rinse  15 mL Mouth Rinse q12n4p  . pantoprazole (PROTONIX) IV  40 mg Intravenous Q12H  . sodium chloride flush  10-40 mL Intracatheter Q12H  . sodium chloride flush  5 mL Intracatheter Q8H   Continuous Infusions: . sodium chloride 10 mL/hr at 12/03/19 0456  . sodium chloride    . sodium chloride Stopped (11/28/19 1707)  . fluconazole (DIFLUCAN) IV Stopped (12/02/19 2103)  . meropenem (MERREM) IV 1 g (12/03/19 0543)  . TPN ADULT (ION) 90 mL/hr at 12/03/19 0456  . TPN ADULT (ION)       LOS: 24 days    Time spent: 25 minutes    Tresa Moore, MD [facility] Pager [phone]  If 7PM-7AM, please contact night-coverage 12/03/2019, 11:43 AM

## 2019-12-03 NOTE — Progress Notes (Signed)
[facility] SURGICAL PROGRESS NOTE  Hospital Day(s): 24.   Post op day(s): 15 Days Post-Op.   Interval History:  Patient seen and examined no acute events or new complaints overnight.  Patient reports she continues to feel better, only complaint is intermittent sharp pain at the drain sites No fever, chills, nausea, emesis Breathing remains baseline, she continues to refuse BiPAP Labs are reassuring, leukocytosis continued to improve to 13.0K yesterday, not redrawn today Renal function remains normal with sCr - 0.50, good UO 1.9L No significant electrolyte abnormalities Continues to have some degree of malnutrition with serum albumin of 1.4 Drain output as follows:              - RLQ: 40 ccs, serous             - RUQ: 8 ccs, serous Most recent RUQ drain cultures with lactobacillus, rare candida albicans Continues on Meropenem and Andifungalin She was advanced to full liquids on 09/22; tolerating Having bowel function Working with therapies; recommending SNF  Vital signs in last 24 hours: [min-max] current  Temp:  [98.2 F (36.8 C)-98.6 F (37 C)] 98.6 F (37 C) (09/23 0532) Pulse Rate:  [104-107] 107 (09/23 0532) Resp:  [20] 20 (09/23 0532) BP: (121-147)/(81-108) 144/95 (09/23 0532) SpO2:  [98 %-100 %] 100 % (09/23 0532) Weight:  [916 kg] 198 kg (09/23 0423)     Height: 5\' 9"  (175.3 cm) Weight: (!) 198 kg BMI (Calculated): 64.43   Intake/Output last 2 shifts:  09/22 0701 - 09/23 0700 In: 3164.4 [P.O.:510; I.V.:1966.2; IV Piggyback:683.2] Out: 1997.5 [Urine:1950; Drains:47.5]   Physical Exam:  Constitutional: alert, cooperative and no distress Respiratory:tachypneic,breathing appears labored however this is relatively unchanged from previous,on [place], refusing BiPAP Cardiovascular:Tachycardicand sinus rhythm  Gastrointestinal:Obese, soft, no appreciable tenderness, non-distended, JP in RLQappears more serous, RUQ drain is serous. Rectal tube in  place Genitourinary: Foley in place Musculoskeletal: 1+ edema to bilateral LE Integumentary:Laparoscopic incisions are CDI with dermabond, no erythema or drainage  Labs:  CBC Latest Ref Rng & Units 12/02/2019 12/01/2019 11/30/2019  WBC 4.0 - 10.5 K/uL 13.0(H) 13.7(H) 16.2(H)  Hemoglobin 12.0 - 15.0 g/dL 7.6(L) 6.7(L) 7.0(L)  Hematocrit 36 - 46 % 24.5(L) 20.8(L) 22.1(L)  Platelets 150 - 400 K/uL 407(H) 403(H) 429(H)   CMP Latest Ref Rng & Units 12/03/2019 12/01/2019 11/30/2019  Glucose 70 - 99 mg/dL 12/02/2019) 384(Y) 659(D)  BUN 6 - 20 mg/dL 17 19 15   Creatinine 0.44 - 1.00 mg/dL 357(S 1.77  Sodium 135 - 145 mmol/L 135 136 135  Potassium 3.5 - 5.1 mmol/L 4.9 4.3 4.3  Chloride 98 - 111 mmol/L 95(L) 96(L) 95(L)  CO2 22 - 32 mmol/L 31 32 33(H)  Calcium 8.9 - 10.3 mg/dL 7.8(L) 7.5(L) 7.5(L)  Total Protein 6.5 - 8.1 g/dL 7.9 - 7.5  Total Bilirubin 0.3 - 1.2 mg/dL 0.5 - 0.5  Alkaline Phos 38 - 126 U/L 171(H) - 137(H)  AST 15 - 41 U/L 46(H) - 24  ALT 0 - 44 U/L 68(H) - 29     Imaging studies: No new pertinent imaging studies   Assessment/Plan: 34 y.o. female 15 Days Post-Op s/p robotic assisted laparoscopic lysis of adhesion and drainage of intra-abdominal abscess WITHOUT evidence of perforation or frank stoolforperforated diverticulitis, complicated by hypoxic respiratory failure requiring post-operative intubation(since extubated).   - I will trial her on soft diet   - Continue TPN at goal rate; she has significant malnutrition and will continue at goal rate until certain  tolerating adequate PO --> if she has adequate PO intake in the next few days we can consider starting to wean her from this - Continue IV ABx; switched to Meropenem, andifungalin added; ID on board(Day 14) - Continue drains; RUQ/RLQ in no rush to remove, may benefit from re-imaging prior to removing these, it is encouraging that the output has cleared - No plans for surgical  re-intervention at this time - Monitor abdominal examination; on-going bowel function - Pain control prn; antiemetics prn - Monitor H&H; s/p transfusion 1 unit pRBC on 09/21 - Monitor leukocytosis, renal function             - Once patient can better ambulate to commode/bathroom then we can consider removing rectal tube/foley - Home medications; continue; appreciate hospitalist consultation -Mobilization encouraged as tolerated; PT/OT following; recommending SNF - DVT prophylaxis  All of the above findings and recommendations were discussed with the patient, and the medical team, and all of patient's questions were answered to her expressed satisfaction.  -- Lynden Oxford, PA-C [facility] 12/03/2019, 7:15 AM [phone] M-F: 7am - 4pm

## 2019-12-03 NOTE — Progress Notes (Signed)
   12/03/19 2035  Assess: MEWS Score  Temp (!) 101.7 F (38.7 C)  BP (!) 151/98  Pulse Rate (!) 107  Resp (!) 24  SpO2 99 %  O2 Device Nasal Cannula  Assess: MEWS Score  MEWS Temp 2  MEWS Systolic 0  MEWS Pulse 1  MEWS RR 1  MEWS LOC 0  MEWS Score 4  MEWS Score Color Red  Assess: if the MEWS score is Yellow or Red  Were vital signs taken at a resting state? Yes  Focused Assessment Change from prior assessment (see assessment flowsheet)  Early Detection of Sepsis Score *See Row Information* Low  MEWS guidelines implemented *See Row Information* Yes  Treat  MEWS Interventions Administered prn meds/treatments  Pain Scale 0-10  Pain Score 7  Pain Type Surgical pain;Acute pain  Pain Location Abdomen  Pain Orientation Right;Upper;Lower  Pain Descriptors / Indicators Aching  Pain Frequency Constant  Pain Onset On-going  Patients Stated Pain Goal 1  Pain Intervention(s) Medication (See eMAR)  Take Vital Signs  Increase Vital Sign Frequency  Red: Q 1hr X 4 then Q 4hr X 4, if remains red, continue Q 4hrs  Escalate  MEWS: Escalate Red: discuss with charge nurse/RN and provider, consider discussing with RRT  Notify: Charge Nurse/RN  Name of Charge Nurse/RN Notified Dawn  Date Charge Nurse/RN Notified 12/03/19  Time Charge Nurse/RN Notified 2042  Notify: Provider  Provider Name/Title B. Jon Billings  Date Provider Notified 12/03/19  Time Provider Notified 2042  Notification Type Page  Notification Reason Change in status (RED MEWS)   Will give Prn meds and continue to monitor accordingly, no need for RRT notification for now.

## 2019-12-03 NOTE — Consult Note (Signed)
PHARMACY - TOTAL PARENTERAL NUTRITION CONSULT NOTE   Indication: Prolonged ileus  Patient Measurements: Height: 5\' 9"  (175.3 cm) Weight: (!) 198 kg (436 lb 8.2 oz) IBW/kg (Calculated) : 66.2 TPN AdjBW (KG): 90.5 Body mass index is 64.46 kg/m.  Assessment: 34 year old female with PMHx of HTN admitted with concern for perforated diverticulitis with contained abscesses. Patient is POD # 15 from laparoscopic lysis of adhesions and drainage of intra-abdominal abscess. There was no evidence of perforation. Procedure complicated by hypoxic respiratory failure requiring intubation. Patient extubated 9/10. No longer requiring blood pressure support. Since the previous note she has been re-assessed for calories and nutrition goals.  Glucose / Insulin: A1C 5.7, BG range 111 - 181: 10 units SSI required.  Electrolytes: Cl:Ac ratio improving, potassium and magnesium trending up although both still WNL Cl trending down now, CO2 trending up now  Renal: SCr < 1, stable  (good UOP) Renal function remains normal, SCr < 1, stable, good UO - 1.9 L LFTs / TGs: LFTs WNL at baseline now starting to increase, TG 69-->193 Prealbumin / albumin: <5 / 1.5-->1.4 Intake / Output; MIVF: Continue to monitor I&Os and needfor intermittent Lasix as tolerated to maintain euvolemia.  GI Imaging: 9/7 CT abdomen pelvis: colonic perforation related to diverticulitis with worsening fluid and gas containing abscesses is throughout the low abdomen and pelvis many with interloop and mesenteric involvement, also with multiple areas of pelvic fluid as well showing generalized inflammation tracking to the mid abdomen. Interval decompression of the abscess in the cul-de-sac following drain placement. Extensive body wall edema.  9/15 CT concerning for perihepatic fluid collection and possible abscess  Surgeries / Procedures:  9/8 Robotic assisted laparoscopic lysis of adhesions and drainage of intra-abdominal abscess  9/17 IR placement  of drain in perihepatic fluid collection   Central access: 11/19/19 TPN start date: 11/19/19  Nutritional Goals  kCal: 2700 - 3000 kCal/day, Protein: >135 g/day, Fluid: > 2000 mL Goal TPN rate is 90 mL/hr (provides 140 g of protein and 2999 kcals per day)  Current Nutrition:  Advanced to full liquids on 9/22, attempting soft diet today  Plan:   Continue TPN rate of 90 mL/hr at 1800 using concentrated amino acids  Nutritional components: 21% dextrose, 65 g/L amino acids, 41.5 g/L lipids, 2999 kCal  Electrolytes in TPN: 69mEq/L of Na, 10 mEq/L of K, 73mEq/L of Ca, 85mEq/L of Mg, phosphorous 15 mmol/L, continue Cl:Ac ratio at max (currently 1 : 1.41)  Potassium decreased from 9/22  Add standard MVI and trace elements to TPN  Continue resistant SSI to q6h SSI and continue to adjust as needed - decrease insulin to 40 units insulin regular in TPN  Fluid amount 10/22  Monitor TPN labs daily until stable then on Mon/Thurs, next in am 9/24  10/24 12/03/2019 7:27 AM

## 2019-12-03 NOTE — Progress Notes (Signed)
Physical Therapy Treatment Patient Details Name: Amanda Reyes MRN: [id number] DOB: 28-Nov-1985 Today's Date: 12/03/2019    History of Present Illness Pt is 34 year old female with past medical history for morbid obesity and hypertension, admitted to ICU due to acute diverticulitis and microperforation.  Patient is s/p of robotic surgery for lysis of adhesions.    PT Comments    Pt was found in bed with mother at bedside. Pt readily agreed to bed therex but was hesitant about sitting on the edge of the bed. Pt was able to perform all bed exercises with some difficulty but with SpO2 remaining in the high 90s. Pt was consoled and PT provided techniques to help combat anxiety surrounding exercise. Pt agreed to come to sitting but promptly began to have a bowel movement and could not initiate sitting. Pt given time to complete bowel movement and additional staff was recruited per pt request but even with rectal tube in place, pt declined to attempt to sit EOB Pt will benefit from PT services in a SNF setting upon discharge to safely address deficits listed in patient problem list for decreased caregiver assistance and eventual return to PLOF.   Follow Up Recommendations  SNF     Equipment Recommendations  Other (comment)    Recommendations for Other Services       Precautions / Restrictions Precautions Precautions: Fall Restrictions Weight Bearing Restrictions: No    Mobility  Bed Mobility               General bed mobility comments: bed therex only  Transfers                 General transfer comment: unable/ unsafe to trial   Ambulation/Gait             General Gait Details: unsafe to attempt   Stairs             Wheelchair Mobility    Modified Rankin (Stroke Patients Only)       Balance                                            Cognition Arousal/Alertness: Awake/alert Behavior During Therapy: Flat affect;WFL for tasks  assessed/performed Overall Cognitive Status: Within Functional Limits for tasks assessed                                 General Comments: pt reported increased anxiety with the thought of sitting up      Exercises Total Joint Exercises Ankle Circles/Pumps: AROM;Both;15 reps Quad Sets: AROM;10 reps;Both Hip ABduction/ADduction: AROM;Both;10 reps Straight Leg Raises: AROM;Both;10 reps Other Exercises Other Exercises: pt educated on techniques to manage anxiety    General Comments        Pertinent Vitals/Pain Pain Score: 8  Pain Location: Lower Right abdomen Pain Descriptors / Indicators: Cramping;Aching Pain Intervention(s): Limited activity within patient's tolerance;Monitored during session    Home Living                      Prior Function            PT Goals (current goals can now be found in the care plan section) Acute Rehab PT Goals Patient Stated Goal: Go home and be able to swim again PT Goal Formulation: With patient  Time For Goal Achievement: 12/08/19 Potential to Achieve Goals: Fair Progress towards PT goals: Not progressing toward goals - comment (pt's reports feeling very anxious about sitting up and is hesitant to progress)    Frequency    Min 2X/week      PT Plan Current plan remains appropriate    Co-evaluation              AM-PAC PT "6 Clicks" Mobility   Outcome Measure  Help needed turning from your back to your side while in a flat bed without using bedrails?: A Lot Help needed moving from lying on your back to sitting on the side of a flat bed without using bedrails?: Total Help needed moving to and from a bed to a chair (including a wheelchair)?: Total Help needed standing up from a chair using your arms (e.g., wheelchair or bedside chair)?: Total Help needed to walk in hospital room?: Total Help needed climbing 3-5 steps with a railing? : Total 6 Click Score: 7    End of Session Equipment Utilized  During Treatment: Gait belt;Oxygen Activity Tolerance: Other (comment);Patient tolerated treatment well (pt limited by anxiety) Patient left: in bed;with family/visitor present;with bed alarm set;with call bell/phone within reach Nurse Communication: Mobility status PT Visit Diagnosis: Repeated falls (R29.6);Muscle weakness (generalized) (M62.81);Difficulty in walking, not elsewhere classified (R26.2) Pain - Right/Left: Right     Time: 8127-5170 PT Time Calculation (min) (ACUTE ONLY): 21 min  Charges:                        Nicolette Bang, SPT 12/03/19. 4:43 PM

## 2019-12-03 NOTE — Telephone Encounter (Signed)
Faxed FMLA to [employer] at [phone] 

## 2019-12-04 ENCOUNTER — Inpatient Hospital Stay: Payer: BC Managed Care – PPO

## 2019-12-04 ENCOUNTER — Encounter: Payer: Self-pay | Admitting: Surgery

## 2019-12-04 DIAGNOSIS — K651 Peritoneal abscess: Secondary | ICD-10-CM | POA: Diagnosis not present

## 2019-12-04 DIAGNOSIS — K5792 Diverticulitis of intestine, part unspecified, without perforation or abscess without bleeding: Secondary | ICD-10-CM | POA: Diagnosis not present

## 2019-12-04 LAB — HEPATIC FUNCTION PANEL
ALT: 72 U/L — ABNORMAL HIGH (ref 0–44)
AST: 38 U/L (ref 15–41)
Albumin: 1.4 g/dL — ABNORMAL LOW (ref 3.5–5.0)
Alkaline Phosphatase: 164 U/L — ABNORMAL HIGH (ref 38–126)
Bilirubin, Direct: <0.1 mg/dL (ref 0.0–0.2)
Total Bilirubin: 0.5 mg/dL (ref 0.3–1.2)
Total Protein: 7.8 g/dL (ref 6.5–8.1)

## 2019-12-04 LAB — MAGNESIUM: Magnesium: 2.2 mg/dL (ref 1.7–2.4)

## 2019-12-04 LAB — RENAL FUNCTION PANEL
Albumin: 1.5 g/dL — ABNORMAL LOW (ref 3.5–5.0)
Anion gap: 8 (ref 5–15)
BUN: 16 mg/dL (ref 6–20)
CO2: 32 mmol/L (ref 22–32)
Calcium: 7.7 mg/dL — ABNORMAL LOW (ref 8.9–10.3)
Chloride: 94 mmol/L — ABNORMAL LOW (ref 98–111)
Creatinine, Ser: 0.53 mg/dL (ref 0.44–1.00)
GFR calc Af Amer: >60 mL/min (ref >60)
GFR calc non Af Amer: >60 mL/min (ref >60)
Glucose, Bld: 149 mg/dL — ABNORMAL HIGH (ref 70–99)
Phosphorus: 3.6 mg/dL (ref 2.5–4.6)
Potassium: 4.4 mmol/L (ref 3.5–5.1)
Sodium: 134 mmol/L — ABNORMAL LOW (ref 135–145)

## 2019-12-04 LAB — CBC
HCT: 22.7 % — ABNORMAL LOW (ref 36.0–46.0)
Hemoglobin: 7 g/dL — ABNORMAL LOW (ref 12.0–15.0)
MCH: 24.6 pg — ABNORMAL LOW (ref 26.0–34.0)
MCHC: 30.8 g/dL (ref 30.0–36.0)
MCV: 79.6 fL — ABNORMAL LOW (ref 80.0–100.0)
Platelets: 335 10*3/uL (ref 150–400)
RBC: 2.85 MIL/uL — ABNORMAL LOW (ref 3.87–5.11)
RDW: 20.2 % — ABNORMAL HIGH (ref 11.5–15.5)
WBC: 13.8 10*3/uL — ABNORMAL HIGH (ref 4.0–10.5)
nRBC: 0.2 % (ref 0.0–0.2)

## 2019-12-04 LAB — GLUCOSE, CAPILLARY
Glucose-Capillary: 151 mg/dL — ABNORMAL HIGH (ref 70–99)
Glucose-Capillary: 155 mg/dL — ABNORMAL HIGH (ref 70–99)
Glucose-Capillary: 167 mg/dL — ABNORMAL HIGH (ref 70–99)
Glucose-Capillary: 170 mg/dL — ABNORMAL HIGH (ref 70–99)

## 2019-12-04 MED ORDER — IOHEXOL 9 MG/ML PO SOLN
500.0000 mL | ORAL | Status: AC
  Administered 2019-12-04: 500 mL via ORAL

## 2019-12-04 MED ORDER — IOHEXOL 300 MG/ML  SOLN
125.0000 mL | Freq: Once | INTRAMUSCULAR | Status: AC | PRN
  Administered 2019-12-05: 125 mL via INTRAVENOUS

## 2019-12-04 MED ORDER — TRACE MINERALS CU-MN-SE-ZN 300-55-60-3000 MCG/ML IV SOLN
INTRAVENOUS | Status: AC
  Filled 2019-12-04 (×2): qty 468

## 2019-12-04 NOTE — Progress Notes (Signed)
Pharmacy Antibiotic Note  Amanda Reyes is a 34 y.o. female admitted on 11/09/2019 with diverticulitis of colon with perforation. Patient day 16 s/p laparoscopic lysis of adhesions and drainage of intra-abdominal abscess with further placement of 3 drains. Pharmacy has been consulted for meropenem and fluconazole dosing. ID is following.   Today, 9/24:  Day #16 Meropenem day #21 abx Day #7 fluconazole 800mg  daily (dosed considering weight) Leukocytosis remains present (WBC 13.8)  Febrile overnight Tm 101.7 F  IR to serially remove drains.   Cultures:  Abscess 9/17: Lactobacillus and Candida Albicans Abscess 9/3: E. Coli & Streptococcus Group C, pan-sensitive  BCx 8/30: NG, final UCx 8/30: NG, final   Scr: 0.53 Baseline QTc 8/30: 390    Plan: Continue meropenem 1g IV every 8 hours and fluconazole 800 mg IV every 24 hours until adequate source control is achieved. Continue to monitor renal function daily throughout therapy. Monitor LFTs weekly throughout therapy with fluconazole.    Height: 5\' 9"  (175.3 cm) Weight: (!) 198 kg (436 lb 8.2 oz) IBW/kg (Calculated) : 66.2  Temp (24hrs), Avg:99.3 F (37.4 C), Min:97.7 F (36.5 C), Max:101.7 F (38.7 C)  Recent Labs  Lab 11/29/19 0602 11/30/19 0354 12/01/19 0515 12/02/19 1110 12/03/19 0415 12/04/19 0530 12/04/19 0944  WBC  --  16.2* 13.7* 13.0*  --   --  13.8*  CREATININE 0.56 0.54 0.52  --  0.50 0.53  --     Estimated Creatinine Clearance: 187.7 mL/min (by C-G formula based on SCr of 0.53 mg/dL).    Allergies  Allergen Reactions   Penicillins Hives    Did it involve swelling of the face/tongue/throat, SOB, or low BP? No Did it involve sudden or severe rash/hives, skin peeling, or any reaction on the inside of your mouth or nose? Yes Did you need to seek medical attention at a hospital or doctor's office? No When did it last happen? If all above answers are NO, may proceed with cephalosporin use..3   Tomato  Rash    Antimicrobials this admission: Meropenem 9/9 >> Fluconazole 9/18>> Anidulagungin 9/9 >>9/17 Metronidazole 8/30>>9/8  Ciprofloxacin 8/30>>9/8     Thank you for allowing pharmacy to be a part of this patients care.  11/9 12/04/2019 1:37 PM

## 2019-12-04 NOTE — Progress Notes (Signed)
ID    Date of Admission:  11/09/2019  11/18/19 laparotomy Antibiotic Meropenem 9/9> anidulaungin 9/9 >9/17 Fluconazole 11/27/19>>  Culture 8/30 BC -NG 9/3 intraabdominal abscess culture   e.coli, group  Streptococcus group C 11/27/19 -RT Upper quadrant abscess culture- lactobacillus and candida albicans   ID: Amanda Reyes is a 34 y.o. female Active Problems:   Diverticulitis of colon with perforation   Diverticulitis of large intestine with perforation and abscess  PCN allergy  Buttock drain 11/13/19>>9/22 RLQ 11/18/19 RUQ drain 11/27/19 PICC 11/19/19 Foley 11/18/19  Subjective: Had fever yesterday In bed   Medications:   sodium chloride   Intravenous Once   Chlorhexidine Gluconate Cloth  6 each Topical Q0600   enoxaparin (LOVENOX) injection  40 mg Subcutaneous Q12H   feeding supplement (ENSURE ENLIVE)  237 mL Oral TID BM   insulin aspart  0-20 Units Subcutaneous Q6H   iohexol  500 mL Oral Q1 Hr x 2   mouth rinse  15 mL Mouth Rinse q12n4p   pantoprazole (PROTONIX) IV  40 mg Intravenous Q12H   sodium chloride flush  10-40 mL Intracatheter Q12H   sodium chloride flush  5 mL Intracatheter Q8H    Objective: Vital signs in last 24 hours: Temp:  [98.2 F (36.8 C)-101.7 F (38.7 C)] 99.2 F (37.3 C) (09/24 0351) Pulse Rate:  [105-109] 105 (09/24 0351) Resp:  [20-24] 22 (09/24 0351) BP: (136-156)/(92-101) 156/101 (09/24 0351) SpO2:  [97 %-100 %] 97 % (09/24 0351)  PHYSICAL EXAM:  General:awake, always in bed, no distress, chronically ill Tongue coated Neck: Supple, Lungs: b/l air entry Decreased bases Heart: Regular rate and rhythm, no murmur, rub or gallop. Abdomen: Soft, rt upper quadrant drain lower quadrant drain Extremities: atraumatic, no cyanosis. ++ edema.  Skin: No rashes or lesions. Or bruising Lymph: Cervical, supraclavicular normal. Neurologic: Grossly non-focal  Lab Results CBC Latest Ref Rng & Units 12/04/2019 12/02/2019 12/01/2019  WBC 4.0  - 10.5 K/uL 13.8(H) 13.0(H) 13.7(H)  Hemoglobin 12.0 - 15.0 g/dL 7.0(L) 7.6(L) 6.7(L)  Hematocrit 36 - 46 % 22.7(L) 24.5(L) 20.8(L)  Platelets 150 - 400 K/uL 335 407(H) 403(H)   Liver Panel Recent Labs    12/03/19 0415 12/03/19 0415 12/04/19 0530 12/04/19 0944  PROT 7.9  --   --  7.8  ALBUMIN 1.4*   < > 1.5* 1.4*  AST 46*  --   --  38  ALT 68*  --   --  72*  ALKPHOS 171*  --   --  164*  BILITOT 0.5  --   --  0.5  BILIDIR  --   --   --  <0.1  IBILI  --   --   --  NOT CALCULATED   < > = values in this interval not displayed.   Imaging CT abdomen 11/25/19 Cul-de-sac drainage catheter in place with resolution of abscess in that location. Additional drainage catheter in the lower anterior pelvis with less abscess fluid in that region. 2. Organizing abscess in the anterior central abdominal mesentery with multiple small air bubbles. Probable interloop abscess in the central to right abdomen measuring approximately 7.8 x 6.5 x 4.4 cm. 3. Subcapsular fluid along the lateral margin of the liver with a prominent collection at the caudal tip where it measures approximately 13 x 4.7 x 11.7 cm consistent with abscess. 4. Ileus pattern with dilated fluid and air-filled loops of small intestine. 5. Moderate right effusion with dependent pulmonary atelectasis. Small pleural effusion on the left. Body  wall edema.    Assessment/Plan: Perforated diverticulitis with extensive intra-abdominal loop abscesses Status post laparoscopy and washout.on 11/18/19 CT abdomen done9/16shows an abscess along rtlateral margin liverand organizing abscesses central abdominal mesentry - . IRplaced another drain 9/17.Culture lactobacillus and rare candida albicans -on meropenem and fluconazole As new fever- CT abdomen is being ordered by Surgery to look for any collections to drain  She needs to participate in PT Discussed with patient and mother and surgical team ID will follow her peripherally this  weekend- call if needed

## 2019-12-04 NOTE — Progress Notes (Addendum)
   12/04/19 0755  Clinical Encounter Type  Visited With Patient  Visit Type Follow-up  Referral From Chaplain  Consult/Referral To Chaplain  Chaplain stopped in and briefly visited with pt. When asked how she was feeling, pt said ok. Chaplain asked, "better" and pt said "yes". Chaplain will follow up with pt later.

## 2019-12-04 NOTE — Progress Notes (Signed)
[facility] SURGICAL PROGRESS NOTE  Hospital Day(s): 25.   Post op day(s): 16 Days Post-Op.   Interval History:  Patient seen and examined She did again develop fevers overnight, T-Max 101.7 at 2030.  Patient reports that she is feeling okay, still with intermittent pain at drain sites No nausea or emesis CBC pending this morning Renal function remains normal, sCr - 0.53, UO - 2.2L Mild hyponatremia to 134, otherwise no electrolyte derangements Drain output as follows:   - RLQ: 20 ccs, serous - RUQ: 40 ccs, serous Most recent RUQ drain cultures with lactobacillus, rare candida albicans Continues onMeropenem and Andifungalin She was advanced to soft diet on 09/23; tolerating Having bowel function Working with therapies; recommending SNF  Vital signs in last 24 hours: [min-max] current  Temp:  [98.2 F (36.8 C)-101.7 F (38.7 C)] 99.2 F (37.3 C) (09/24 0351) Pulse Rate:  [105-109] 105 (09/24 0351) Resp:  [20-24] 22 (09/24 0351) BP: (136-156)/(92-101) 156/101 (09/24 0351) SpO2:  [97 %-100 %] 97 % (09/24 0351)     Height: 5\' 9"  (175.3 cm) Weight: (!) 198 kg BMI (Calculated): 64.43   Intake/Output last 2 shifts:  09/23 0701 - 09/24 0700 In: 3502.8 [P.O.:478; I.V.:2132.7; IV Piggyback:872.1] Out: 2310 [Urine:2200; Drains:60; Stool:50]   Physical Exam:  Constitutional: alert, cooperative and no distress Respiratory:tachypneic,breathing appears labored however this is relatively unchanged from previous,on [place], refusing BiPAP Cardiovascular:Tachycardicand sinus rhythm  Gastrointestinal:Obese, soft, no appreciable tenderness, non-distended, JP in RLQappears more serous, RUQ drain isserous. Rectal tube in place Genitourinary: Foley in place Musculoskeletal: 1+ edema to bilateral LE Integumentary:Laparoscopic incisions are CDI with dermabond, no erythema or drainage  Labs:  CBC Latest Ref Rng & Units 12/02/2019 12/01/2019 11/30/2019  WBC 4.0  - 10.5 K/uL 13.0(H) 13.7(H) 16.2(H)  Hemoglobin 12.0 - 15.0 g/dL 7.6(L) 6.7(L) 7.0(L)  Hematocrit 36 - 46 % 24.5(L) 20.8(L) 22.1(L)  Platelets 150 - 400 K/uL 407(H) 403(H) 429(H)   CMP Latest Ref Rng & Units 12/04/2019 12/03/2019 12/01/2019  Glucose 70 - 99 mg/dL 12/03/2019) 505(L) 976(B)  BUN 6 - 20 mg/dL 16 17 19   Creatinine 0.44 - 1.00 mg/dL 341(P 3.79  Sodium 135 - 145 mmol/L 134(L) 135 136  Potassium 3.5 - 5.1 mmol/L 4.4 4.9 4.3  Chloride 98 - 111 mmol/L 94(L) 95(L) 96(L)  CO2 22 - 32 mmol/L 32 31 32  Calcium 8.9 - 10.3 mg/dL 7.7(L) 7.8(L) 7.5(L)  Total Protein 6.5 - 8.1 g/dL - 7.9 -  Total Bilirubin 0.3 - 1.2 mg/dL - 0.5 -  Alkaline Phos 38 - 126 U/L - 171(H) -  AST 15 - 41 U/L - 46(H) -  ALT 0 - 44 U/L - 68(H) -     Imaging studies: No new pertinent imaging studies   Assessment/Plan:  34 y.o. female again with intermittent fevers 16 Days Post-Op s/p robotic assisted laparoscopic lysis of adhesion and drainage of intra-abdominal abscess WITHOUT evidence of perforation or frank stoolforperforated diverticulitis, complicated by hypoxic respiratory failure requiring post-operative intubation(since extubated).   - Okay to continue soft diet  - We can wean TPN to half rate today; continue otherwise for now, no rush to stop this completely until certain she has adequate pO intake   - Continue IV ABx; switched to Meropenem, andifungalin added; ID on board(Day 15) - Continue drains; RUQ/RLQ in no rush to remove, may benefit from re-imaging prior to removing these, it is encouraging that the output has cleared - No plans for surgical re-intervention at this time -  Monitor abdominal examination; on-going bowel function - Pain control prn; antiemetics prn -Monitor H&H; s/p transfusion 1 unit pRBC on 09/21 - Monitor leukocytosis, renal function - Once patient can better ambulate to commode/bathroom  then we can consider removing rectal tube/foley - Home medications; continue; appreciate hospitalist consultation -Mobilization encouraged as tolerated; PT/OT following; recommending SNF - DVT prophylaxis  All of the above findings and recommendations were discussed with the patient, and the medical team, and all of patient's questions were answered to her expressed satisfaction.  -- Lynden Oxford, PA-C [facility] 12/04/2019, 7:19 AM [phone] M-F: 7am - 4pm

## 2019-12-04 NOTE — Consult Note (Signed)
PHARMACY - TOTAL PARENTERAL NUTRITION CONSULT NOTE   Indication: Prolonged ileus  Patient Measurements: Height: 5\' 9"  (175.3 cm) Weight: (!) 198 kg (436 lb 8.2 oz) IBW/kg (Calculated) : 66.2 TPN AdjBW (KG): 90.5 Body mass index is 64.46 kg/m.  Assessment: 34 year old female with PMHx of HTN admitted with concern for perforated diverticulitis with contained abscesses. Patient is POD # 16 from laparoscopic lysis of adhesions and drainage of intra-abdominal abscess. There was no evidence of perforation. Procedure complicated by hypoxic respiratory failure requiring intubation. Patient extubated 9/10. No longer requiring blood pressure support. Since the previous note she has been re-assessed for calories and nutrition goals.  Glucose / Insulin: A1C 5.7, BG range 151-160: 16 units SSI required.  Electrolytes: Cl:Ac ratio improving, potassium and magnesium trending up although both still WNL Cl trending down now, CO2 trending up now  Renal: SCr < 1, stable  (good UOP) Renal function remains normal, SCr < 1, stable, good UO - 1.9 L LFTs / TGs: LFTs WNL at baseline now starting to increase, TG 69-->193 Prealbumin / albumin: <5 / 1.5-->1.4 Intake / Output; MIVF: Continue to monitor I&Os and needfor intermittent Lasix as tolerated to maintain euvolemia.  GI Imaging: 9/7 CT abdomen pelvis: colonic perforation related to diverticulitis with worsening fluid and gas containing abscesses is throughout the low abdomen and pelvis many with interloop and mesenteric involvement, also with multiple areas of pelvic fluid as well showing generalized inflammation tracking to the mid abdomen. Interval decompression of the abscess in the cul-de-sac following drain placement. Extensive body wall edema.  9/15 CT concerning for perihepatic fluid collection and possible abscess  Surgeries / Procedures:  9/8 Robotic assisted laparoscopic lysis of adhesions and drainage of intra-abdominal abscess  9/17 IR placement of  drain in perihepatic fluid collection   Central access: 11/19/19 TPN start date: 11/19/19  Nutritional Goals  kCal: 2700 - 3000 kCal/day, Protein: >135 g/day, Fluid: > 2000 mL Goal TPN rate is 90 mL/hr (provides 140 g of protein and 2999 kcals per day)  Current Nutrition:  Advanced to full liquids on 9/22 then advanced to soft diet on 9/23  Plan:   Decrease TPN to half rate of 45 mL/hr at 1800 using concentrated amino acids  Nutritional components: 21% dextrose, 65 g/L amino acids, 41.5 g/L lipids, 1499 kCal  Electrolytes in TPN: 17mEq/L of Na, 20 mEq/L of K, 66mEq/L of Ca, 90mEq/L of Mg, phosphorous 15 mmol/L, continue Cl:Ac ratio at max (currently 1 : 1.14)  Potassium increased to 20 mEq/L for about same total daily potassium amount as current full TPN  Add standard MVI and trace elements to TPN  Continue resistant SSI to q6h SSI and continue to adjust as needed - insulin decreased to 20 units insulin regular in TPN as TPN cut in half  Monitor TPN labs daily until stable then on Mon/Thurs, next in am 9/25   10/25 12/04/2019 9:01 AM

## 2019-12-04 NOTE — Progress Notes (Signed)
Pt declined BiPAP overnight. Aware of its importance, re-educated but prefers to stay on nasal cannula overnight.

## 2019-12-05 ENCOUNTER — Inpatient Hospital Stay: Payer: BC Managed Care – PPO

## 2019-12-05 DIAGNOSIS — K5720 Diverticulitis of large intestine with perforation and abscess without bleeding: Secondary | ICD-10-CM | POA: Diagnosis not present

## 2019-12-05 LAB — BASIC METABOLIC PANEL
Anion gap: 9 (ref 5–15)
BUN: 17 mg/dL (ref 6–20)
CO2: 31 mmol/L (ref 22–32)
Calcium: 8.1 mg/dL — ABNORMAL LOW (ref 8.9–10.3)
Chloride: 96 mmol/L — ABNORMAL LOW (ref 98–111)
Creatinine, Ser: 0.52 mg/dL (ref 0.44–1.00)
GFR calc Af Amer: >60 mL/min (ref >60)
GFR calc non Af Amer: >60 mL/min (ref >60)
Glucose, Bld: 136 mg/dL — ABNORMAL HIGH (ref 70–99)
Potassium: 4.8 mmol/L (ref 3.5–5.1)
Sodium: 136 mmol/L (ref 135–145)

## 2019-12-05 LAB — CBC
HCT: 23.8 % — ABNORMAL LOW (ref 36.0–46.0)
Hemoglobin: 7.7 g/dL — ABNORMAL LOW (ref 12.0–15.0)
MCH: 24.7 pg — ABNORMAL LOW (ref 26.0–34.0)
MCHC: 32.4 g/dL (ref 30.0–36.0)
MCV: 76.3 fL — ABNORMAL LOW (ref 80.0–100.0)
Platelets: 346 10*3/uL (ref 150–400)
RBC: 3.12 MIL/uL — ABNORMAL LOW (ref 3.87–5.11)
RDW: 20.7 % — ABNORMAL HIGH (ref 11.5–15.5)
WBC: 14.7 10*3/uL — ABNORMAL HIGH (ref 4.0–10.5)
nRBC: 0.3 % — ABNORMAL HIGH (ref 0.0–0.2)

## 2019-12-05 LAB — GLUCOSE, CAPILLARY
Glucose-Capillary: 138 mg/dL — ABNORMAL HIGH (ref 70–99)
Glucose-Capillary: 151 mg/dL — ABNORMAL HIGH (ref 70–99)
Glucose-Capillary: 179 mg/dL — ABNORMAL HIGH (ref 70–99)
Glucose-Capillary: 205 mg/dL — ABNORMAL HIGH (ref 70–99)

## 2019-12-05 LAB — PHOSPHORUS: Phosphorus: 3.8 mg/dL (ref 2.5–4.6)

## 2019-12-05 LAB — MAGNESIUM: Magnesium: 2.2 mg/dL (ref 1.7–2.4)

## 2019-12-05 MED ORDER — LABETALOL HCL 5 MG/ML IV SOLN
20.0000 mg | INTRAVENOUS | Status: DC | PRN
  Administered 2019-12-05 – 2019-12-06 (×3): 20 mg via INTRAVENOUS
  Filled 2019-12-05 (×3): qty 4

## 2019-12-05 MED ORDER — AMLODIPINE BESYLATE 5 MG PO TABS
5.0000 mg | ORAL_TABLET | Freq: Every day | ORAL | Status: DC
  Administered 2019-12-05 – 2019-12-06 (×2): 5 mg via ORAL
  Filled 2019-12-05 (×2): qty 1

## 2019-12-05 MED ORDER — TRACE MINERALS CU-MN-SE-ZN 300-55-60-3000 MCG/ML IV SOLN
INTRAVENOUS | Status: AC
  Filled 2019-12-05: qty 468

## 2019-12-05 MED ORDER — SODIUM CHLORIDE 0.9 % IV BOLUS
1000.0000 mL | Freq: Once | INTRAVENOUS | Status: AC
  Administered 2019-12-05: 1000 mL via INTRAVENOUS

## 2019-12-05 NOTE — Progress Notes (Signed)
CC: Diverticulitis Subjective: No new complaints. Refused PT and unable to mobilize, has a rectal tube and foley. Ct personally reviewed showing a  Pelvic collection that is not easily amenable for perc drainage. No free air or free perforation  Objective: Vital signs in last 24 hours: Temp:  [99.3 F (37.4 C)-100.4 F (38 C)] 100.4 F (38 C) (09/25 1422) Pulse Rate:  [112-123] 114 (09/25 1422) Resp:  [19-20] 19 (09/25 1422) BP: (142-158)/(82-105) 142/98 (09/25 1422) SpO2:  [92 %-100 %] 97 % (09/25 1422) Weight:  [224.5 kg] 224.5 kg (09/25 0438) Last BM Date: 12/05/19  Intake/Output from previous day: 09/24 0701 - 09/25 0700 In: 2995.9 [P.O.:720; I.V.:1576; IV Piggyback:699.8] Out: 3550 [Urine:3500; Drains:50] Intake/Output this shift: Total I/O In: -  Out: 750 [Urine:750]  Physical exam: Morbidly obese Abd: soft, two drain in place RLQ drain w 1/4 inch out and had some intermittent leak some purulent fluid. RUQ serous, incisions c/d/i  No peritonitis  Lab Results: CBC  Recent Labs    12/04/19 0944 12/05/19 0444  WBC 13.8* 14.7*  HGB 7.0* 7.7*  HCT 22.7* 23.8*  PLT 335 346   BMET Recent Labs    12/04/19 0530 12/05/19 0444  NA 134* 136  K 4.4 4.8  CL 94* 96*  CO2 32 31  GLUCOSE 149* 136*  BUN 16 17  CREATININE 0.53 0.52  CALCIUM 7.7* 8.1*   PT/INR No results for input(s): LABPROT, INR in the last 72 hours. ABG No results for input(s): PHART, HCO3 in the last 72 hours.  Invalid input(s): PCO2, PO2  Studies/Results: No results found.  Anti-infectives: Anti-infectives (From admission, onward)   Start     Dose/Rate Route Frequency Ordered Stop   12/03/19 2000  fluconazole (DIFLUCAN) IVPB 400 mg       "And" Linked Group Details   400 mg 100 mL/hr over 120 Minutes Intravenous Every 24 hours 12/03/19 1657     12/03/19 1800  fluconazole (DIFLUCAN) IVPB 400 mg       "And" Linked Group Details   400 mg 100 mL/hr over 120 Minutes Intravenous Every 24  hours 12/03/19 1657     11/30/19 2200  meropenem (MERREM) 1 g in sodium chloride 0.9 % 100 mL IVPB        1 g 200 mL/hr over 30 Minutes Intravenous Every 8 hours 11/30/19 1531     11/28/19 1600  fluconazole (DIFLUCAN) IVPB 800 mg  Status:  Discontinued        800 mg 100 mL/hr over 240 Minutes Intravenous Every 24 hours 11/27/19 1642 12/03/19 1657   11/20/19 1600  anidulafungin (ERAXIS) 100 mg in sodium chloride 0.9 % 100 mL IVPB  Status:  Discontinued        100 mg 78 mL/hr over 100 Minutes Intravenous Every 24 hours 11/20/19 1227 11/27/19 1642   11/19/19 1400  anidulafungin (ERAXIS) 200 mg in sodium chloride 0.9 % 200 mL IVPB       Note to Pharmacy: Please place order for  tomorrow's dose   200 mg 78 mL/hr over 200 Minutes Intravenous  Once 11/19/19 1311 11/19/19 1714   11/19/19 1400  meropenem (MERREM) 1 g in sodium chloride 0.9 % 100 mL IVPB  Status:  Discontinued        1 g 200 mL/hr over 30 Minutes Intravenous Every 8 hours 11/19/19 1320 11/30/19 1325   11/09/19 2200  metroNIDAZOLE (FLAGYL) IVPB 500 mg  Status:  Discontinued       "And" Linked  Group Details   500 mg 100 mL/hr over 60 Minutes Intravenous Every 8 hours 11/09/19 1810 11/19/19 1311   11/09/19 2000  ciprofloxacin (CIPRO) IVPB 400 mg  Status:  Discontinued       "And" Linked Group Details   400 mg 200 mL/hr over 60 Minutes Intravenous Every 12 hours 11/09/19 1810 11/19/19 1311   11/09/19 1815  metroNIDAZOLE (FLAGYL) IVPB 500 mg        500 mg 100 mL/hr over 60 Minutes Intravenous  Once 11/09/19 1800 11/09/19 2317   11/09/19 1815  ciprofloxacin (CIPRO) IVPB 400 mg        400 mg 200 mL/hr over 60 Minutes Intravenous  Once 11/09/19 1800 11/09/19 1946      Assessment/Plan:  Diverticulitis s/p lap drainage of multiple collection and IR drainage of multiple collection on a super morbidly obese pt. CT scan shows a deep collection, not likely amenable to draiange. She is not toxic and she is not peritonitic. I would  continue A/Bs rx for now. Very complex and difficult case w/o any good answers. I think doing major open surgery would not be in her best interest.  Sterling Big, MD, [facility]  12/05/2019

## 2019-12-05 NOTE — Progress Notes (Addendum)
Mobility Specialist - Progress Note   12/05/19 1420  Mobility  Activity Refused mobility  Mobility performed by Mobility specialist    Pt refused mobility session this afternoon d/t having severe pain. Will re-attempt session at a later date/time.   Fannie Gathright Mobility Specialist  12/05/19, 2:22 PM

## 2019-12-05 NOTE — Progress Notes (Signed)
PROGRESS NOTE    Amanda Reyes  NUU:[id number] DOB: [birth year] DOA: 11/09/2019 PCP: Patient, No Pcp Per  Brief Narrative:  34 year old female 14 days postop status post operative drainage of intra-abdominal abscesses.  No evidence of perforation or frank stool.  Postoperative course complicated by hypoxic respiratory failure requiring intubation.  Now extubated and appears to be improving.  Patient is surgery primary.  Medicine on consult. Advance to soft diet per general surgery.  Patient having bowel function.  Fever noted 9/24.  WBC uptrending.  Attempted to get repeat CT abd but were unable to get patient down to radiology  BP remains elevated.  Requiring multiple labetalol doses.    Assessment & Plan:   Active Problems:   Diverticulitis of colon with perforation   Diverticulitis of large intestine with perforation and abscess  Intra-abdominal abscesses status post laparoscopic lysis of adhesions and drainage Status post drain placement initially on 11/18/2019 Drain upsized with IR on 11/27/2019 Drains with serous output Patient clinically improving Currently on meropenem and fluconazole Has been on TPN Breakthrough fever noted 9/24 CT abd unable to be performed yet as patient could not be transported  WBC uptrending Plan: Continue current antibiotics, meropenem and fluconazole ID following, recommendations appreciated Remainder of care per surgical primary team On TPN per surgery  Hypertension BP control suboptimal Requiring multiple doses of prn labetalol Recommend adding scheduled amlodipine 5mg  daily, can uptitrate to 10mg  QD if needed Continue prn labetalol Vitals per unit protocol  Transaminitis Mild elevation in LFTs compared to 11/30/2019 Likely due to TPN Continue to trend LFTs Avoid hepatotoxins if possible Dose of APAP decreased to 650 q6h  Hypoxic and hypercarbic respiratory failure Obesity hypoventilation syndrome Respiratory status at or near  baseline Requiring 2L oxygen Mental status at baseline Plan: Continue nightly BiPAP and as needed Supplemental oxygen as needed As needed nebs    Type 2 diabetes mellitus with hyperglycemia Sugars well controlled over interval Goal range 140-180 Continue sliding scale coverage Consider additional low-dose Lantus if glycemic control becomes an issue   Acute kidney injury Resolved Avoid nephrotoxins     [facility] hospitalist service will continue to follow this patient with you.  Please feel free to reach out with any questions or concerns  DVT prophylaxis: Lovenox, 40 mg subcu every 12 hours Code Status: Full Family Communication: None today Disposition Plan: Per general surgery.  Medicine on consult   Consultants:   Medicine  Infectious disease  Procedures:   Laparoscopic lysis of adhesions and drainage of intra-abdominal abscess  Drain upsizing  Antimicrobials:   Meropenem  Fluconazole   Subjective: Seen and examined.  Complains of some fatigue and immobility this morning  Objective: Vitals:   12/04/19 1159 12/04/19 1700 12/04/19 1946 12/05/19 0438  BP: (!) 149/89  (!) 147/93 (!) 158/105  Pulse: (!) 108  (!) 122 (!) 112  Resp: 16  20 20   Temp: 97.7 F (36.5 C)  100.2 F (37.9 C) 99.7 F (37.6 C)  TempSrc: Oral   Oral  SpO2: 100% 95% 92% 100%  Weight:    (!) 224.5 kg  Height:        Intake/Output Summary (Last 24 hours) at 12/05/2019 0916 Last data filed at 12/05/2019 Gross per 24 hour  Intake 2995.86 ml  Output 4300 ml  Net -1304.14 ml   Filed Weights   11/30/19 0500 12/03/19 0423 12/05/19 0438  Weight: (!) 198 kg (!) 198 kg (!) 224.5 kg    Examination:  General exam: No  acute distress. Respiratory system: Decreased at bases.  Normal work of breathing.  2 L cardiovascular system: Tachycardic, regular rhythm, no murmurs Gastrointestinal system: Obese, nondistended.  Tender to palpation epigastrium.  Positive bowel sounds.  Abdominal  drain in place Central nervous system: Alert and oriented. No focal neurological deficits. Extremities: Diffusely decreased muscle strength bilaterally Skin: No rashes, lesions or ulcers Psychiatry: Judgement and insight appear normal. Mood & affect appropriate.     Data Reviewed: I have personally reviewed following labs and imaging studies  CBC: Recent Labs  Lab 11/30/19 0354 12/01/19 0515 12/02/19 1110 12/04/19 0944 12/05/19 0444  WBC 16.2* 13.7* 13.0* 13.8* 14.7*  NEUTROABS 12.4*  --   --   --   --   HGB 7.0* 6.7* 7.6* 7.0* 7.7*  HCT 22.1* 20.8* 24.5* 22.7* 23.8*  MCV 75.7* 75.1* 80.3 79.6* 76.3*  PLT 429* 403* 407* 335 346   Basic Metabolic Panel: Recent Labs  Lab 11/30/19 0354 12/01/19 0515 12/03/19 0415 12/04/19 0530 12/05/19 0444  NA 135 136 135 134* 136  K 4.3 4.3 4.9 4.4 4.8  CL 95* 96* 95* 94* 96*  CO2 33* 32 31 32 31  GLUCOSE 166* 174* 111* 149* 136*  BUN 15 19 17 16 17   CREATININE 0.54 0.52 0.50 0.53 0.52  CALCIUM 7.5* 7.5* 7.8* 7.7* 8.1*  MG 1.9  --  2.1 2.2 2.2  PHOS 2.9  --  3.8 3.6 3.8   GFR: Estimated Creatinine Clearance: 204.5 mL/min (by C-G formula based on SCr of 0.52 mg/dL). Liver Function Tests: Recent Labs  Lab 11/30/19 0354 12/03/19 0415 12/04/19 0530 12/04/19 0944  AST 24 46*  --  38  ALT 29 68*  --  72*  ALKPHOS 137* 171*  --  164*  BILITOT 0.5 0.5  --  0.5  PROT 7.5 7.9  --  7.8  ALBUMIN 1.4* 1.4* 1.5* 1.4*   No results for input(s): LIPASE, AMYLASE in the last 168 hours. No results for input(s): AMMONIA in the last 168 hours. Coagulation Profile: No results for input(s): INR, PROTIME in the last 168 hours. Cardiac Enzymes: No results for input(s): CKTOTAL, CKMB, CKMBINDEX, TROPONINI in the last 168 hours. BNP (last 3 results) No results for input(s): PROBNP in the last 8760 hours. HbA1C: No results for input(s): HGBA1C in the last 72 hours. CBG: Recent Labs  Lab 12/04/19 0516 12/04/19 1158 12/04/19 1744  12/04/19 2341 12/05/19 0511  GLUCAP 151* 170* 155* 167* 138*   Lipid Profile: No results for input(s): CHOL, HDL, LDLCALC, TRIG, CHOLHDL, LDLDIRECT in the last 72 hours. Thyroid Function Tests: No results for input(s): TSH, T4TOTAL, FREET4, T3FREE, THYROIDAB in the last 72 hours. Anemia Panel: No results for input(s): VITAMINB12, FOLATE, FERRITIN, TIBC, IRON, RETICCTPCT in the last 72 hours. Sepsis Labs: No results for input(s): PROCALCITON, LATICACIDVEN in the last 168 hours.  Recent Results (from the past 240 hour(s))  Aerobic/Anaerobic Culture (surgical/deep wound)     Status: None   Collection Time: 11/27/19 12:48 PM   Specimen: Abscess  Result Value Ref Range Status   Specimen Description   Final    ABSCESS ABDOMEN Performed at [facility], [address] [place], [place]    Special Requests   Final    Normal Performed at [facility], [address] [place], [place]    Gram Stain   Final    MODERATE WBC PRESENT,BOTH PMN AND MONONUCLEAR MODERATE GRAM POSITIVE RODS    Culture  Final    FEW LACTOBACILLUS SPECIES RARE CANDIDA ALBICANS NO ANAEROBES ISOLATED Performed at [facility], [address] [place], [place] [address]    Report Status 12/02/2019 FINAL  Final         Radiology Studies: No results found.      Scheduled Meds: . sodium chloride   Intravenous Once  . amLODipine  5 mg Oral Daily  . Chlorhexidine Gluconate Cloth  6 each Topical Q0600  . enoxaparin (LOVENOX) injection  40 mg Subcutaneous Q12H  . feeding supplement (ENSURE ENLIVE)  237 mL Oral TID BM  . insulin aspart  0-20 Units Subcutaneous Q6H  . mouth rinse  15 mL Mouth Rinse q12n4p  . pantoprazole (PROTONIX) IV  40 mg Intravenous Q12H  . sodium chloride flush  10-40 mL Intracatheter Q12H  . sodium chloride flush  5 mL Intracatheter Q8H   Continuous Infusions: . sodium chloride Stopped (12/04/19 0520)  . sodium chloride    . sodium  chloride Stopped (11/28/19 1707)  . fluconazole (DIFLUCAN) IV Stopped (12/04/19 1950)   And  . fluconazole (DIFLUCAN) IV Stopped (12/04/19 2240)  . meropenem (MERREM) IV 1 g (12/05/19 0505)  . TPN ADULT (ION) 45 mL/hr at 12/05/19 0300     LOS: 26 days    Time spent: 25 minutes    Tresa Moore, MD [facility] Pager [phone]  If 7PM-7AM, please contact night-coverage 12/05/2019, 9:16 AM

## 2019-12-05 NOTE — Consult Note (Signed)
PHARMACY - TOTAL PARENTERAL NUTRITION CONSULT NOTE   Indication: Prolonged ileus  Patient Measurements: Height: 5\' 9"  (175.3 cm) Weight: (!) 224.5 kg (495 lb) IBW/kg (Calculated) : 66.2 TPN AdjBW (KG): 90.5 Body mass index is 73.1 kg/m.  Assessment: 34 year old female with PMHx of HTN admitted with concern for perforated diverticulitis with contained abscesses. Patient is POD # 17 from laparoscopic lysis of adhesions and drainage of intra-abdominal abscess.   Glucose / Insulin: A1C 5.7, BG range 136 - 170: 15 units SSI required.  Electrolytes: Cl:Ac ratio improving, potassium and magnesium trending up although both still WNL Cl trending down now, CO2 trending up now  Renal: SCr < 1, stable  (good UOP) Renal function remains normal, SCr < 1, stable, good UO - 1.9 L LFTs / TGs: LFTs WNL at baseline now starting to increase, TG 69-->193 Prealbumin / albumin: <5 / 1.5-->1.4 Intake / Output; MIVF: Continue to monitor I&Os and needfor intermittent Lasix as tolerated to maintain euvolemia.  GI Imaging: 9/7 CT abdomen pelvis: colonic perforation related to diverticulitis with worsening fluid and gas containing abscesses is throughout the low abdomen and pelvis many with interloop and mesenteric involvement, also with multiple areas of pelvic fluid as well showing generalized inflammation tracking to the mid abdomen. Interval decompression of the abscess in the cul-de-sac following drain placement. Extensive body wall edema.  9/15 CT concerning for perihepatic fluid collection and possible abscess  Surgeries / Procedures:  9/8 Robotic assisted laparoscopic lysis of adhesions and drainage of intra-abdominal abscess  9/17 IR placement of drain in perihepatic fluid collection   Central access: 11/19/19 TPN start date: 11/19/19  Nutritional Goals  kCal: 2700 - 3000 kCal/day, Protein: >135 g/day, Fluid: > 2000 mL Goal TPN rate is 90 mL/hr (provides 140 g of protein and 2999 kcals per  day)  Current Nutrition:  Advanced to full liquids on 9/22 then advanced to soft diet on 9/23  Plan:   continue TPN at half rate of 45 mL/hr at 1800 using concentrated amino acids  Nutritional components: 21% dextrose, 65 g/L amino acids, 41.5 g/L lipids, 1499 kCal  Electrolytes in TPN: 51mEq/L of Na, 10 mEq/L of K, 26mEq/L of Ca, 0 mEq/L of Mg, phosphorous 15 mmol/L, continue Cl:Ac ratio at max (currently 1 : 1.41)  Potassium decreased to 10 mEq/L   Magnesium was removed  Add standard MVI and trace elements to TPN  Continue resistant SSI to q6h SSI and continue to adjust as needed - insulin decreased to 20 units insulin regular in TPN as TPN cut in half  Monitor TPN labs daily until stable then on Mon/Thurs, next in am 9/26   10/26 12/05/2019 7:05 AM

## 2019-12-06 ENCOUNTER — Inpatient Hospital Stay: Payer: BC Managed Care – PPO

## 2019-12-06 DIAGNOSIS — K5720 Diverticulitis of large intestine with perforation and abscess without bleeding: Secondary | ICD-10-CM | POA: Diagnosis not present

## 2019-12-06 LAB — CBC WITH DIFFERENTIAL/PLATELET
Abs Immature Granulocytes: 0.3 10*3/uL — ABNORMAL HIGH (ref 0.00–0.07)
Basophils Absolute: 0.1 10*3/uL (ref 0.0–0.1)
Basophils Relative: 0 %
Eosinophils Absolute: 0.2 10*3/uL (ref 0.0–0.5)
Eosinophils Relative: 1 %
HCT: 24.7 % — ABNORMAL LOW (ref 36.0–46.0)
Hemoglobin: 7.6 g/dL — ABNORMAL LOW (ref 12.0–15.0)
Immature Granulocytes: 2 %
Lymphocytes Relative: 10 %
Lymphs Abs: 1.8 10*3/uL (ref 0.7–4.0)
MCH: 24.2 pg — ABNORMAL LOW (ref 26.0–34.0)
MCHC: 30.8 g/dL (ref 30.0–36.0)
MCV: 78.7 fL — ABNORMAL LOW (ref 80.0–100.0)
Monocytes Absolute: 1.2 10*3/uL — ABNORMAL HIGH (ref 0.1–1.0)
Monocytes Relative: 7 %
Neutro Abs: 14.5 10*3/uL — ABNORMAL HIGH (ref 1.7–7.7)
Neutrophils Relative %: 80 %
Platelets: 332 10*3/uL (ref 150–400)
RBC: 3.14 MIL/uL — ABNORMAL LOW (ref 3.87–5.11)
RDW: 20.3 % — ABNORMAL HIGH (ref 11.5–15.5)
WBC: 18 10*3/uL — ABNORMAL HIGH (ref 4.0–10.5)
nRBC: 0.3 % — ABNORMAL HIGH (ref 0.0–0.2)

## 2019-12-06 LAB — BASIC METABOLIC PANEL
Anion gap: 7 (ref 5–15)
BUN: 19 mg/dL (ref 6–20)
CO2: 31 mmol/L (ref 22–32)
Calcium: 7.7 mg/dL — ABNORMAL LOW (ref 8.9–10.3)
Chloride: 96 mmol/L — ABNORMAL LOW (ref 98–111)
Creatinine, Ser: 0.46 mg/dL (ref 0.44–1.00)
GFR calc Af Amer: >60 mL/min (ref >60)
GFR calc non Af Amer: >60 mL/min (ref >60)
Glucose, Bld: 135 mg/dL — ABNORMAL HIGH (ref 70–99)
Potassium: 4.5 mmol/L (ref 3.5–5.1)
Sodium: 134 mmol/L — ABNORMAL LOW (ref 135–145)

## 2019-12-06 LAB — GLUCOSE, CAPILLARY
Glucose-Capillary: 127 mg/dL — ABNORMAL HIGH (ref 70–99)
Glucose-Capillary: 155 mg/dL — ABNORMAL HIGH (ref 70–99)
Glucose-Capillary: 149 mg/dL — ABNORMAL HIGH (ref 70–99)

## 2019-12-06 LAB — MAGNESIUM: Magnesium: 1.9 mg/dL (ref 1.7–2.4)

## 2019-12-06 LAB — OSMOLALITY: Osmolality: 287 mOsm/kg (ref 275–295)

## 2019-12-06 MED ORDER — AMLODIPINE BESYLATE 10 MG PO TABS
10.0000 mg | ORAL_TABLET | Freq: Every day | ORAL | Status: DC
  Administered 2019-12-07 – 2019-12-23 (×17): 10 mg via ORAL
  Filled 2019-12-06 (×17): qty 1

## 2019-12-06 MED ORDER — TRACE MINERALS CU-MN-SE-ZN 300-55-60-3000 MCG/ML IV SOLN
INTRAVENOUS | Status: AC
  Filled 2019-12-06: qty 468

## 2019-12-06 NOTE — Progress Notes (Signed)
PROGRESS NOTE    Amanda KurtzShamika L Reyes  ZOX:[id number] DOB: 08/03/1985 DOA: 11/09/2019 PCP: Patient, No Pcp Per  Brief Narrative:  34 year old female 14 days postop status post operative drainage of intra-abdominal abscesses.  No evidence of perforation or frank stool.  Postoperative course complicated by hypoxic respiratory failure requiring intubation.  Now extubated and appears to be improving.  Patient is surgery primary.  Medicine on consult. Advance to soft diet per general surgery.  Patient having bowel function.  Fever noted 9/24.  WBC uptrending.  Attempted to get repeat CT abd but were unable to get patient down to radiology  BP remains elevated.  Requiring multiple labetalol doses.  Patient continues to be hypertensive, tachycardic, spike intermittent low-grade temperatures.  Etiology unclear.  Possible differentials include pain, infection, pulmonary embolism, atelectasis.    Assessment & Plan:   Active Problems:   Diverticulitis of colon with perforation   Diverticulitis of large intestine with perforation and abscess    Complex patient.  No clear answers.  Patient remains persistently tachycardic and hypertensive.  Possible etiologies include pain, atelectasis, worsening or new source of infection, pulmonary embolism.  Volume status difficult to accurately ascertain given body habitus.  Do not feel that weights nor ins and outs are accurate.  Patient has been getting approximately 1700 cc of fluid with her TPN daily.  Low suspicion for volume depletion.  Patient is high risk for clinical deterioration.  Case discussed with general surgery Dr. Everlene FarrierPabon.    Intra-abdominal abscesses status post laparoscopic lysis of adhesions and drainage Status post drain placement initially on 11/18/2019 Drain upsized with IR on 11/27/2019 Drains with serous output Patient clinically improving Currently on meropenem and fluconazole Has been on TPN Breakthrough fever noted 9/24 Repeat CT scan  with persistent mild diverticulitis and contained perforation WBC uptrending Plan: Continue current antibiotics, meropenem and fluconazole ID following, recommendations appreciated Remainder of care per surgical primary team On TPN per surgery  Hypertension Tachycardia Breakthrough fever BP control suboptimal Patient remained persistently tachycardic Requiring multiple doses of prn labetalol Also spiking intermittent low-grade temperatures Plan: Continue amlodipine, uptitrate to 10 mg daily Continue prn labetalol Vitals per unit protocol Attempt to treat underlying illness Repeat blood cultures as needed for fever Stress incentive spirometry use  Transaminitis Mild elevation in LFTs compared to 11/30/2019 Likely due to TPN Continue to trend LFTs Avoid hepatotoxins if possible Dose of APAP decreased to 650 q6h  Hypoxic and hypercarbic respiratory failure Obesity hypoventilation syndrome Respiratory status at or near baseline Requiring 2L oxygen Mental status at baseline Plan: Continue nightly BiPAP and as needed Supplemental oxygen as needed As needed nebs  Type 2 diabetes mellitus with hyperglycemia Sugars well controlled over interval Goal range 140-180 Continue sliding scale coverage Consider additional low-dose Lantus if glycemic control becomes an issue  Acute kidney injury Resolved Avoid nephrotoxins   Thank you for allowing us to participate in the care of your patient.  [facility] will continue to follow this patient with you.    DVT prophylaxis: Lovenox, 40 mg subcu every 12 hours Code Status: Full Family Communication: None today Disposition Plan: Per general surgery.  Medicine on consult   Consultants:   Medicine  Infectious disease  Procedures:   Laparoscopic lysis of adhesions and drainage of intra-abdominal abscess  Drain upsizing  Antimicrobials:   Meropenem  Fluconazole   Subjective: Seen and examined.   Complains of diffuse pain this morning.  Nontoxic-appearing  Objective: Vitals:   12/05/19 0934 12/05/19 1422 12/05/19  1819 12/06/19 0549  BP: (!) 153/82 (!) 142/98 124/80 (!) 155/104  Pulse: (!) 123 (!) 114 (!) 114 (!) 117  Resp: 19 19 19  (!) 22  Temp: 99.3 F (37.4 C) (!) 100.4 F (38 C) 98.8 F (37.1 C) 98.6 F (37 C)  TempSrc: Oral Oral Oral Oral  SpO2: 93% 97% 95% 100%  Weight:      Height:        Intake/Output Summary (Last 24 hours) at 12/06/2019 1106 Last data filed at 12/06/2019 0653 Gross per 24 hour  Intake 3077.84 ml  Output 1755 ml  Net 1322.84 ml   Filed Weights   11/30/19 0500 12/03/19 0423 12/05/19 0438  Weight: (!) 198 kg (!) 198 kg (!) 224.5 kg    Examination:  General exam: No acute distress. Respiratory system: Decreased at bases.  Normal work of breathing.  2 L cardiovascular system: Tachycardic, regular rhythm, no murmurs Gastrointestinal system: Obese, nondistended.  Tender to palpation epigastrium.  Positive bowel sounds.  Abdominal drain in place Central nervous system: Alert and oriented. No focal neurological deficits. Extremities: Diffusely decreased muscle strength bilaterally Skin: No rashes, lesions or ulcers Psychiatry: Judgement and insight appear normal. Mood & affect appropriate.     Data Reviewed: I have personally reviewed following labs and imaging studies  CBC: Recent Labs  Lab 11/30/19 0354 12/01/19 0515 12/02/19 1110 12/04/19 0944 12/05/19 0444  WBC 16.2* 13.7* 13.0* 13.8* 14.7*  NEUTROABS 12.4*  --   --   --   --   HGB 7.0* 6.7* 7.6* 7.0* 7.7*  HCT 22.1* 20.8* 24.5* 22.7* 23.8*  MCV 75.7* 75.1* 80.3 79.6* 76.3*  PLT 429* 403* 407* 335 346   Basic Metabolic Panel: Recent Labs  Lab 11/30/19 0354 11/30/19 0354 12/01/19 0515 12/03/19 0415 12/04/19 0530 12/05/19 0444 12/06/19 0518  NA 135   < > 136 135 134* 136 134*  K 4.3   < > 4.3 4.9 4.4 4.8 4.5  CL 95*   < > 96* 95* 94* 96* 96*  CO2 33*   < > 32 31 32 31  31  GLUCOSE 166*   < > 174* 111* 149* 136* 135*  BUN 15   < > 19 17 16 17 19   CREATININE 0.54   < > 0.52 0.50 0.53 0.52 0.46  CALCIUM 7.5*   < > 7.5* 7.8* 7.7* 8.1* 7.7*  MG 1.9  --   --  2.1 2.2 2.2 1.9  PHOS 2.9  --   --  3.8 3.6 3.8  --    < > = values in this interval not displayed.   GFR: Estimated Creatinine Clearance: 204.5 mL/min (by C-G formula based on SCr of 0.46 mg/dL). Liver Function Tests: Recent Labs  Lab 11/30/19 0354 12/03/19 0415 12/04/19 0530 12/04/19 0944  AST 24 46*  --  38  ALT 29 68*  --  72*  ALKPHOS 137* 171*  --  164*  BILITOT 0.5 0.5  --  0.5  PROT 7.5 7.9  --  7.8  ALBUMIN 1.4* 1.4* 1.5* 1.4*   No results for input(s): LIPASE, AMYLASE in the last 168 hours. No results for input(s): AMMONIA in the last 168 hours. Coagulation Profile: No results for input(s): INR, PROTIME in the last 168 hours. Cardiac Enzymes: No results for input(s): CKTOTAL, CKMB, CKMBINDEX, TROPONINI in the last 168 hours. BNP (last 3 results) No results for input(s): PROBNP in the last 8760 hours. HbA1C: No results for input(s): HGBA1C in the last  72 hours. CBG: Recent Labs  Lab 12/05/19 0511 12/05/19 1142 12/05/19 1701 12/05/19 2355 12/06/19 0513  GLUCAP 138* 151* 205* 179* 127*   Lipid Profile: No results for input(s): CHOL, HDL, LDLCALC, TRIG, CHOLHDL, LDLDIRECT in the last 72 hours. Thyroid Function Tests: No results for input(s): TSH, T4TOTAL, FREET4, T3FREE, THYROIDAB in the last 72 hours. Anemia Panel: No results for input(s): VITAMINB12, FOLATE, FERRITIN, TIBC, IRON, RETICCTPCT in the last 72 hours. Sepsis Labs: No results for input(s): PROCALCITON, LATICACIDVEN in the last 168 hours.  Recent Results (from the past 240 hour(s))  Aerobic/Anaerobic Culture (surgical/deep wound)     Status: None   Collection Time: 11/27/19 12:48 PM   Specimen: Abscess  Result Value Ref Range Status   Specimen Description   Final    ABSCESS ABDOMEN Performed at [facility], [address], [place], [place] [address]    Special Requests   Final    Normal Performed at [facility], [address] [place], [place] [address]    Gram Stain   Final    MODERATE WBC PRESENT,BOTH PMN AND MONONUCLEAR MODERATE GRAM POSITIVE RODS    Culture   Final    FEW LACTOBACILLUS SPECIES RARE CANDIDA ALBICANS NO ANAEROBES ISOLATED Performed at [facility], [address], [place], [place] [address]    Report Status 12/02/2019 FINAL  Final         Radiology Studies: CT ABDOMEN PELVIS W CONTRAST  Result Date: 12/05/2019 CLINICAL DATA:  Complicated perforated diverticulitis. Follow-up diverticular abscess status post percutaneous catheter drainage. EXAM: CT ABDOMEN AND PELVIS WITH CONTRAST TECHNIQUE: Multidetector CT imaging of the abdomen and pelvis was performed using the standard protocol following bolus administration of intravenous contrast. CONTRAST:  OMNIPAQUE IOHEXOL 300 MG/ML  SOLN COMPARISON:  11/25/2019 FINDINGS: Lower Chest: Stable small to moderate right pleural effusion and compressive right lower lobe atelectasis. Hepatobiliary: No hepatic masses identified. Prior cholecystectomy. No evidence of biliary obstruction. A percutaneous drainage catheter is now seen in place along the lateral margin of the right hepatic lobe. Previously seen fluid collection at this site is nearly completely resolved. Pancreas:  No mass or inflammatory changes. Spleen: Within normal limits in size and appearance. Adrenals/Urinary Tract: No masses identified. 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 No evidence of ureteral calculi or hydronephrosis. Urinary bladder is nearly completely empty with Foley catheter in place. Stomach/Bowel: Mild diverticulitis is again seen involving the sigmoid colon, without significant change. Drain remains in place with the tip in the inferior right pelvis. Extraluminal gas  collections are again seen in the sigmoid mesocolon, with largest measuring 7.3 cm on image 69/2 compared to 6.9 cm previously. This is consistent with localized perforation. A small amount of free intraperitoneal air is noted but decreased since previous study. Generalized gaseous distention of small large bowel is unchanged, consistent with adynamic ileus. Vascular/Lymphatic: No pathologically enlarged lymph nodes. No abdominal aortic aneurysm. Reproductive:  No mass or other significant abnormality. Other:  Diffuse body wall edema, without significant change. Musculoskeletal:  No suspicious bone lesions identified. : Near complete resolution of right perihepatic fluid collection following percutaneous drainage catheter placement. Persistent mild sigmoid diverticulitis. Persistent extraluminal gas collections in the sigmoid mesocolon, consistent with perforation. However free intraperitoneal air has nearly completely resolved since prior exam. Adynamic ileus pattern, without significant change. Stable small to moderate right pleural effusion and compressive right lower lobe atelectasis. Stable diffuse body wall edema. Electronically Signed  By: Danae Orleans M.D.   On: 12/05/2019 13:29        Scheduled Meds:  sodium chloride   Intravenous Once   amLODipine  5 mg Oral Daily   Chlorhexidine Gluconate Cloth  6 each Topical Q0600   enoxaparin (LOVENOX) injection  40 mg Subcutaneous Q12H   feeding supplement (ENSURE ENLIVE)  237 mL Oral TID BM   insulin aspart  0-20 Units Subcutaneous Q6H   mouth rinse  15 mL Mouth Rinse q12n4p   pantoprazole (PROTONIX) IV  40 mg Intravenous Q12H   sodium chloride flush  10-40 mL Intracatheter Q12H   sodium chloride flush  5 mL Intracatheter Q8H   Continuous Infusions:  sodium chloride Stopped (12/04/19 0520)   sodium chloride     sodium chloride 10 mL/hr at 12/06/19 0300   fluconazole (DIFLUCAN) IV 400 mg (12/05/19 1722)   And   fluconazole  (DIFLUCAN) IV Stopped (12/06/19 0146)   meropenem (MERREM) IV 1 g (12/06/19 0521)   TPN ADULT (ION) 45 mL/hr at 12/06/19 0300   TPN ADULT (ION)       LOS: 27 days    Time spent: 25 minutes    Tresa Moore, MD [facility] Pager [phone]  If 7PM-7AM, please contact night-coverage 12/06/2019, 11:06 AM

## 2019-12-06 NOTE — Consult Note (Signed)
PHARMACY - TOTAL PARENTERAL NUTRITION CONSULT NOTE   Indication: Prolonged ileus  Patient Measurements: Height: 5\' 9"  (175.3 cm) Weight: (!) 224.5 kg (495 lb) IBW/kg (Calculated) : 66.2 TPN AdjBW (KG): 90.5 Body mass index is 73.1 kg/m.  Assessment: 34 year old female with PMHx of HTN admitted with concern for perforated diverticulitis with contained abscesses. Patient is POD # 18 from laparoscopic lysis of adhesions and drainage of intra-abdominal abscess.   Glucose / Insulin: A1C 5.7, BG range 136 - 205: 18 units SSI required.  Electrolytes: Cl:Ac ratio improving, sodium trending lower, 32 ratio have improved Renal: SCr < 1, stable Renal function remains normal, SCr < 1, stable, UO - 2.4 L LFTs / TGs: LFTs WNL at baseline, ALT trending up slightly still, TG 69-->193 Prealbumin / albumin: <5 / 1.5-->1.4 Intake / Output; MIVF: Continue to monitor I&Os  GI Imaging: 9/7 CT abdomen pelvis: colonic perforation related to diverticulitis with worsening fluid and gas containing abscesses is throughout the low abdomen and pelvis many with interloop and mesenteric involvement, also with multiple areas of pelvic fluid as well showing generalized inflammation tracking to the mid abdomen. Interval decompression of the abscess in the cul-de-sac following drain placement. Extensive body wall edema.  9/15 CT concerning for perihepatic fluid collection and possible abscess  Surgeries / Procedures:  9/8 Robotic assisted laparoscopic lysis of adhesions and drainage of intra-abdominal abscess  9/17 IR placement of drain in perihepatic fluid collection   Central access: 11/19/19 TPN start date: 11/19/19  Nutritional Goals  kCal: 2700 - 3000 kCal/day, Protein: >135 g/day, Fluid: > 2000 mL Goal TPN rate is 90 mL/hr (provides 140 g of protein and 2999 kcals per day)  Current Nutrition:  Advanced to full liquids on 9/22 then advanced to soft diet on 9/23  Plan:   continue TPN at half rate of  45 mL/hr at 1800 using concentrated amino acids  Nutritional components: 21% dextrose, 65 g/L amino acids, 41.5 g/L lipids, 1499 kCal  Electrolytes in TPN: 7mEq/L of Na, 10 mEq/L of K, 6mEq/L of Ca, 0 mEq/L of Mg, phosphorous 15 mmol/L, continue Cl:Ac ratio at max (currently 1 : 1.41)  Sodium increased to 83mEq/L  Add standard MVI and trace elements to TPN  Continue resistant SSI to q6h SSI and continue to adjust as needed - 20 units insulin regular in TPN   Monitor TPN labs daily until stable then on Mon/Thurs, next in am 9/27   10/27 12/06/2019 7:34 AM

## 2019-12-06 NOTE — Progress Notes (Signed)
   12/06/19 1952  Assess: MEWS Score  Temp 98.8 F (37.1 C)  BP (!) 150/94  Pulse Rate (!) 122  Resp (!) 22  SpO2 92 %  O2 Device Room Air  Assess: MEWS Score  MEWS Temp 0  MEWS Systolic 0  MEWS Pulse 2  MEWS RR 1  MEWS LOC 0  MEWS Score 3  MEWS Score Color Yellow  Assess: if the MEWS score is Yellow or Red  Were vital signs taken at a resting state? Yes  Focused Assessment Change from prior assessment (see assessment flowsheet)  Early Detection of Sepsis Score *See Row Information* Medium  MEWS guidelines implemented *See Row Information* Yes  Treat  MEWS Interventions Administered prn meds/treatments  Take Vital Signs  Increase Vital Sign Frequency  Yellow: Q 2hr X 2 then Q 4hr X 2, if remains yellow, continue Q 4hrs  Escalate  MEWS: Escalate Yellow: discuss with charge nurse/RN and consider discussing with provider and RRT  Notify: Charge Nurse/RN  Name of Charge Nurse/RN Notified Delaney Meigs   Date Charge Nurse/RN Notified 12/06/19  Time Charge Nurse/RN Notified 2014  Document  Progress note created (see row info) Yes   Pt's MEWS score increased from a 2 to a 3. I assessed the pt and administered Labetalol per the prn guidelines.

## 2019-12-06 NOTE — Progress Notes (Signed)
CC: Diverticulitis  Subjective: No new complaints. Refused PT and unable to mobilize, has a rectal tube and foley. No other complaints. States she is too weak to get up   Objective: Vital signs in last 24 hours: Temp:  [98.6 F (37 C)-99.4 F (37.4 C)] 99.4 F (37.4 C) (09/26 1206) Pulse Rate:  [114-118] 118 (09/26 1206) Resp:  [18-22] 18 (09/26 1206) BP: (124-155)/(80-104) 154/91 (09/26 1206) SpO2:  [95 %-100 %] 96 % (09/26 1206) Last BM Date: 12/06/19  Intake/Output from previous day: 09/25 0701 - 09/26 0700 In: 3077.8 [P.O.:240; I.V.:1321.5; IV Piggyback:1516.4] Out: 2505 [Urine:2450; Drains:35; Stool:20] Intake/Output this shift: No intake/output data recorded.  Physical exam: Super morbidly obese Abd: soft, minimal tenderness w/o peritonitis now RLQ some feculent fluid   Lab Results: CBC  Recent Labs    12/04/19 0944 12/05/19 0444  WBC 13.8* 14.7*  HGB 7.0* 7.7*  HCT 22.7* 23.8*  PLT 335 346   BMET Recent Labs    12/05/19 0444 12/06/19 0518  NA 136 134*  K 4.8 4.5  CL 96* 96*  CO2 31 31  GLUCOSE 136* 135*  BUN 17 19  CREATININE 0.52 0.46  CALCIUM 8.1* 7.7*   PT/INR No results for input(s): LABPROT, INR in the last 72 hours. ABG No results for input(s): PHART, HCO3 in the last 72 hours.  Invalid input(s): PCO2, PO2  Studies/Results: CT ABDOMEN PELVIS W CONTRAST  Result Date: 12/05/2019 CLINICAL DATA:  Complicated perforated diverticulitis. Follow-up diverticular abscess status post percutaneous catheter drainage. EXAM: CT ABDOMEN AND PELVIS WITH CONTRAST TECHNIQUE: Multidetector CT imaging of the abdomen and pelvis was performed using the standard protocol following bolus administration of intravenous contrast. CONTRAST:  OMNIPAQUE IOHEXOL 300 MG/ML  SOLN COMPARISON:  11/25/2019 FINDINGS: Lower Chest: Stable small to moderate right pleural effusion and compressive right lower lobe atelectasis. Hepatobiliary: No hepatic masses identified.  Prior cholecystectomy. No evidence of biliary obstruction. A percutaneous drainage catheter is now seen in place along the lateral margin of the right hepatic lobe. Previously seen fluid collection at this site is nearly completely resolved. Pancreas:  No mass or inflammatory changes. Spleen: Within normal limits in size and appearance. Adrenals/Urinary Tract: No masses identified. 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 No evidence of ureteral calculi or hydronephrosis. Urinary bladder is nearly completely empty with Foley catheter in place. Stomach/Bowel: Mild diverticulitis is again seen involving the sigmoid colon, without significant change. Drain remains in place with the tip in the inferior right pelvis. Extraluminal gas collections are again seen in the sigmoid mesocolon, with largest measuring 7.3 cm on image 69/2 compared to 6.9 cm previously. This is consistent with localized perforation. A small amount of free intraperitoneal air is noted but decreased since previous study. Generalized gaseous distention of small large bowel is unchanged, consistent with adynamic ileus. Vascular/Lymphatic: No pathologically enlarged lymph nodes. No abdominal aortic aneurysm. Reproductive:  No mass or other significant abnormality. Other:  Diffuse body wall edema, without significant change. Musculoskeletal:  No suspicious bone lesions identified. : Near complete resolution of right perihepatic fluid collection following percutaneous drainage catheter placement. Persistent mild sigmoid diverticulitis. Persistent extraluminal gas collections in the sigmoid mesocolon, consistent with perforation. However free intraperitoneal air has nearly completely resolved since prior exam. Adynamic ileus pattern, without significant change. Stable small to moderate right pleural effusion and compressive right lower lobe atelectasis. Stable diffuse body wall edema. Electronically Signed    By: Dietrich Pates.D.  On: 12/05/2019 13:29   DG Chest Port 1 View  Result Date: 12/06/2019 CLINICAL DATA:  Chest pain EXAM: PORTABLE CHEST 1 VIEW COMPARISON:  11/20/2019 FINDINGS: Cardiac shadow remains enlarged. Right-sided PICC line is again noted and stable. Endotracheal tube and gastric catheter have been removed in the interval. Persistent vascular congestion is noted although improved when compared with the prior exam. Small right-sided pleural effusion is noted. IMPRESSION: Persistent but improved vascular congestion. Small right pleural effusion is noted. Electronically Signed   By: Alcide Clever M.D.   On: 12/06/2019 11:35    Anti-infectives: Anti-infectives (From admission, onward)   Start     Dose/Rate Route Frequency Ordered Stop   12/03/19 2000  fluconazole (DIFLUCAN) IVPB 400 mg       "And" Linked Group Details   400 mg 100 mL/hr over 120 Minutes Intravenous Every 24 hours 12/03/19 1657     12/03/19 1800  fluconazole (DIFLUCAN) IVPB 400 mg       "And" Linked Group Details   400 mg 100 mL/hr over 120 Minutes Intravenous Every 24 hours 12/03/19 1657     11/30/19 2200  meropenem (MERREM) 1 g in sodium chloride 0.9 % 100 mL IVPB        1 g 200 mL/hr over 30 Minutes Intravenous Every 8 hours 11/30/19 1531     11/28/19 1600  fluconazole (DIFLUCAN) IVPB 800 mg  Status:  Discontinued        800 mg 100 mL/hr over 240 Minutes Intravenous Every 24 hours 11/27/19 1642 12/03/19 1657   11/20/19 1600  anidulafungin (ERAXIS) 100 mg in sodium chloride 0.9 % 100 mL IVPB  Status:  Discontinued        100 mg 78 mL/hr over 100 Minutes Intravenous Every 24 hours 11/20/19 1227 11/27/19 1642   11/19/19 1400  anidulafungin (ERAXIS) 200 mg in sodium chloride 0.9 % 200 mL IVPB       Note to Pharmacy: Please place order for  tomorrow's dose   200 mg 78 mL/hr over 200 Minutes Intravenous  Once 11/19/19 1311 11/19/19 1714   11/19/19 1400  meropenem (MERREM) 1 g in sodium chloride 0.9 % 100 mL IVPB   Status:  Discontinued        1 g 200 mL/hr over 30 Minutes Intravenous Every 8 hours 11/19/19 1320 11/30/19 1325   11/09/19 2200  metroNIDAZOLE (FLAGYL) IVPB 500 mg  Status:  Discontinued       "And" Linked Group Details   500 mg 100 mL/hr over 60 Minutes Intravenous Every 8 hours 11/09/19 1810 11/19/19 1311   11/09/19 2000  ciprofloxacin (CIPRO) IVPB 400 mg  Status:  Discontinued       "And" Linked Group Details   400 mg 200 mL/hr over 60 Minutes Intravenous Every 12 hours 11/09/19 1810 11/19/19 1311   11/09/19 1815  metroNIDAZOLE (FLAGYL) IVPB 500 mg        500 mg 100 mL/hr over 60 Minutes Intravenous  Once 11/09/19 1800 11/09/19 2317   11/09/19 1815  ciprofloxacin (CIPRO) IVPB 400 mg        400 mg 200 mL/hr over 60 Minutes Intravenous  Once 11/09/19 1800 11/09/19 1946      Assessment/Plan:  Reticulitis status post drainage of multiple abdominal abscesses laparoscopically and also percutaneously.  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  Is a very difficult situation given the status of the patient.  She is super morbidly obese with a reported BMI of 73 now and unable to mobilize.  She is at high risk of decubitus ulcer, PE, airway compromise and multiple other complications related to the high BMI and mobility issues.  We will manage this fistula conservatively with drain and I will back off from the diet to a clear liquid diet for now. Splane the seriousness of the situation to the mother who understands.  Again this is a very difficult situation with no good answers or solutions May need CTA to rule out PE and we will discuss with hospitalist about this.  Sterling Big, MD, [facility]  12/06/2019

## 2019-12-06 NOTE — Progress Notes (Signed)
Mobility Specialist - Progress Note   12/06/19 1419  Mobility  Activity Contraindicated/medical hold  Mobility performed by Mobility specialist    Pt's RHR was high, trending at 124 bpm. Not medically appropriate to see for mobility session. HR falls out of safety guidelines. Will hold and re-attempt session at a later date/time.    Izayah Miner Mobility Specialist  12/06/19, 2:22 PM

## 2019-12-07 ENCOUNTER — Inpatient Hospital Stay: Payer: BC Managed Care – PPO

## 2019-12-07 DIAGNOSIS — K5792 Diverticulitis of intestine, part unspecified, without perforation or abscess without bleeding: Secondary | ICD-10-CM | POA: Diagnosis not present

## 2019-12-07 DIAGNOSIS — K651 Peritoneal abscess: Secondary | ICD-10-CM | POA: Diagnosis not present

## 2019-12-07 LAB — GLUCOSE, CAPILLARY
Glucose-Capillary: 112 mg/dL — ABNORMAL HIGH (ref 70–99)
Glucose-Capillary: 119 mg/dL — ABNORMAL HIGH (ref 70–99)
Glucose-Capillary: 134 mg/dL — ABNORMAL HIGH (ref 70–99)
Glucose-Capillary: 138 mg/dL — ABNORMAL HIGH (ref 70–99)

## 2019-12-07 LAB — COMPREHENSIVE METABOLIC PANEL
ALT: 74 U/L — ABNORMAL HIGH (ref 0–44)
AST: 42 U/L — ABNORMAL HIGH (ref 15–41)
Albumin: 1.4 g/dL — ABNORMAL LOW (ref 3.5–5.0)
Alkaline Phosphatase: 190 U/L — ABNORMAL HIGH (ref 38–126)
Anion gap: 6 (ref 5–15)
BUN: 20 mg/dL (ref 6–20)
CO2: 30 mmol/L (ref 22–32)
Calcium: 7.7 mg/dL — ABNORMAL LOW (ref 8.9–10.3)
Chloride: 98 mmol/L (ref 98–111)
Creatinine, Ser: 0.53 mg/dL (ref 0.44–1.00)
GFR calc Af Amer: >60 mL/min (ref >60)
GFR calc non Af Amer: >60 mL/min (ref >60)
Glucose, Bld: 125 mg/dL — ABNORMAL HIGH (ref 70–99)
Potassium: 4.4 mmol/L (ref 3.5–5.1)
Sodium: 134 mmol/L — ABNORMAL LOW (ref 135–145)
Total Bilirubin: 0.5 mg/dL (ref 0.3–1.2)
Total Protein: 8 g/dL (ref 6.5–8.1)

## 2019-12-07 LAB — CBC WITH DIFFERENTIAL/PLATELET
Abs Immature Granulocytes: 0.28 10*3/uL — ABNORMAL HIGH (ref 0.00–0.07)
Basophils Absolute: 0.1 10*3/uL (ref 0.0–0.1)
Basophils Relative: 1 %
Eosinophils Absolute: 0.2 10*3/uL (ref 0.0–0.5)
Eosinophils Relative: 1 %
HCT: 23.5 % — ABNORMAL LOW (ref 36.0–46.0)
Hemoglobin: 7.4 g/dL — ABNORMAL LOW (ref 12.0–15.0)
Immature Granulocytes: 2 %
Lymphocytes Relative: 13 %
Lymphs Abs: 1.9 10*3/uL (ref 0.7–4.0)
MCH: 24.6 pg — ABNORMAL LOW (ref 26.0–34.0)
MCHC: 31.5 g/dL (ref 30.0–36.0)
MCV: 78.1 fL — ABNORMAL LOW (ref 80.0–100.0)
Monocytes Absolute: 1.2 10*3/uL — ABNORMAL HIGH (ref 0.1–1.0)
Monocytes Relative: 8 %
Neutro Abs: 11.6 10*3/uL — ABNORMAL HIGH (ref 1.7–7.7)
Neutrophils Relative %: 75 %
Platelets: 324 10*3/uL (ref 150–400)
RBC: 3.01 MIL/uL — ABNORMAL LOW (ref 3.87–5.11)
RDW: 20.2 % — ABNORMAL HIGH (ref 11.5–15.5)
WBC: 15.3 10*3/uL — ABNORMAL HIGH (ref 4.0–10.5)
nRBC: 0.5 % — ABNORMAL HIGH (ref 0.0–0.2)

## 2019-12-07 LAB — TRIGLYCERIDES: Triglycerides: 245 mg/dL — ABNORMAL HIGH (ref <150)

## 2019-12-07 LAB — MAGNESIUM: Magnesium: 2 mg/dL (ref 1.7–2.4)

## 2019-12-07 LAB — PREALBUMIN: Prealbumin: 8.6 mg/dL — ABNORMAL LOW (ref 18–38)

## 2019-12-07 LAB — PHOSPHORUS: Phosphorus: 4 mg/dL (ref 2.5–4.6)

## 2019-12-07 MED ORDER — IOHEXOL 350 MG/ML SOLN
100.0000 mL | Freq: Once | INTRAVENOUS | Status: AC | PRN
  Administered 2019-12-07: 100 mL via INTRAVENOUS

## 2019-12-07 MED ORDER — FENTANYL CITRATE (PF) 100 MCG/2ML IJ SOLN
INTRAMUSCULAR | Status: AC | PRN
  Administered 2019-12-07 (×2): 50 ug via INTRAVENOUS

## 2019-12-07 MED ORDER — FENTANYL CITRATE (PF) 100 MCG/2ML IJ SOLN
INTRAMUSCULAR | Status: AC
  Filled 2019-12-07: qty 2

## 2019-12-07 MED ORDER — MIDAZOLAM HCL 2 MG/2ML IJ SOLN
INTRAMUSCULAR | Status: AC | PRN
  Administered 2019-12-07: 1 mg via INTRAVENOUS

## 2019-12-07 MED ORDER — MIDAZOLAM HCL 2 MG/2ML IJ SOLN
INTRAMUSCULAR | Status: AC
  Filled 2019-12-07: qty 4

## 2019-12-07 MED ORDER — SODIUM CHLORIDE 0.9% FLUSH
5.0000 mL | Freq: Three times a day (TID) | INTRAVENOUS | Status: DC
  Administered 2019-12-07 – 2019-12-23 (×43): 5 mL

## 2019-12-07 MED ORDER — TRACE MINERALS CU-MN-SE-ZN 300-55-60-3000 MCG/ML IV SOLN
INTRAVENOUS | Status: AC
  Filled 2019-12-07: qty 936

## 2019-12-07 MED ORDER — SODIUM CHLORIDE 0.9 % IV SOLN
INTRAVENOUS | Status: AC | PRN
  Administered 2019-12-07: 10 mL/h via INTRAVENOUS

## 2019-12-07 NOTE — Progress Notes (Signed)
[facility] SURGICAL PROGRESS NOTE  Hospital Day(s): 28.   Post op day(s): 19 Days Post-Op.   Interval History:  Patient seen and examined no acute events or new complaints overnight.  Patient reports she still having abdominal pain, right mid abdomen, unchanged No fever, chills, nausea, emesis Leukocytosis fluctuating but improving, down to 15.3K Renal function remains normal, sCr - 0.53, UO - 1.7L No significant electrolyte derangements Drain output as follows:              - RLQ: 10ccs, serous - RUQ:10ccs, serous Continues onMeropenem Back down to CLD; tolerating Having bowel function Working with therapies; recommending SNF  Vital signs in last 24 hours: [min-max] current  Temp:  [98.8 F (37.1 C)-99.9 F (37.7 C)] 99.3 F (37.4 C) (09/27 0454) Pulse Rate:  [114-122] 114 (09/27 0454) Resp:  [18-22] 20 (09/27 0454) BP: (140-154)/(75-94) 149/75 (09/27 0454) SpO2:  [90 %-96 %] 95 % (09/27 0454) Weight:  [220.4 kg] 220.4 kg (09/27 0454)     Height: 5\' 9"  (175.3 cm) Weight: (!) 220.4 kg BMI (Calculated): 71.74   Intake/Output last 2 shifts:  09/26 0701 - 09/27 0700 In: 980 [P.O.:550; I.V.:30; IV Piggyback:400] Out: 1770 [Urine:1750; Drains:20]   Physical Exam:  Constitutional: alert, cooperative and no distress Respiratory:tachypneic,breathing appears labored however this is relatively unchanged from previous,on [place], refusing BiPAP Cardiovascular:Tachycardicand sinus rhythm  Gastrointestinal:Obese, soft, she appears to be tender in right mid-abdomen but this is difficult to pinpoint given body habitus, non-distended, JP in RLQappears more serous, RUQ drain isserous. Rectal tube in place Genitourinary: Foley in place Musculoskeletal: 1+ edema to bilateral LE Integumentary:Laparoscopic incisions are CDI with dermabond, no erythema or drainage  Labs:  CBC Latest Ref Rng & Units 12/07/2019 12/06/2019 12/05/2019  WBC 4.0 - 10.5 K/uL  15.3(H) 18.0(H) 14.7(H)  Hemoglobin 12.0 - 15.0 g/dL 7.4(L) 7.6(L) 7.7(L)  Hematocrit 36 - 46 % 23.5(L) 24.7(L) 23.8(L)  Platelets 150 - 400 K/uL 324 332 346   CMP Latest Ref Rng & Units 12/07/2019 12/06/2019 12/05/2019  Glucose 70 - 99 mg/dL 12/07/2019) 951(O) 841(Y)  BUN 6 - 20 mg/dL 20 19 17   Creatinine 0.44 - 1.00 mg/dL 606(T 0.16  Sodium 135 - 145 mmol/L 134(L) 134(L) 136  Potassium 3.5 - 5.1 mmol/L 4.4 4.5 4.8  Chloride 98 - 111 mmol/L 98 96(L) 96(L)  CO2 22 - 32 mmol/L 30 31 31   Calcium 8.9 - 10.3 mg/dL 7.7(L) 7.7(L) 8.1(L)  Total Protein 6.5 - 8.1 g/dL 8.0 - -  Total Bilirubin 0.3 - 1.2 mg/dL 0.5 - -  Alkaline Phos 38 - 126 U/L 190(H) - -  AST 15 - 41 U/L 42(H) - -  ALT 0 - 44 U/L 74(H) - -     Imaging studies: No new pertinent imaging studies   Assessment/Plan:  34 y.o. female 42 Days Post-Op s/p robotic assisted laparoscopic lysis of adhesion and drainage of intra-abdominal abscess WITHOUT evidence of perforation or frank stoolforperforated diverticulitis, complicated by hypoxic respiratory failure requiring post-operative intubation(since extubated).   - Myself, or Dr , will discuss with IR the regarding the feasibility of draining her gaseous pocket and possible fistula. This appears to be fairly well surrounded by bowel and I am not sure there will be a great window.     - Continue TPN, NPO, Nutritional supplements   - Continue IV ABx; switched to Meropenem, andifungalin added; ID on board(Day 18) - Continue drains;RUQ/RLQ in no rush to remove, may benefit from re-imaging prior  to removing these, it is encouraging that the output has cleared - No plans for surgical re-intervention at this time - Monitor abdominal examination; on-going bowel function - Pain control prn; antiemetics prn -Monitor H&H; s/p transfusion 1 unit pRBC on 09/21 - Monitor leukocytosis, renal  function - Once patient can better ambulate to commode/bathroom then we can consider removing rectal tube/foley - Home medications; continue; appreciate hospitalist consultation -Mobilization encouraged as tolerated; PT/OT following; recommending SNF - DVT prophylaxis  All of the above findings and recommendations were discussed with the patient, patient's family (mother at bedside) and the medical team, and all of patient's questions were answered toher expressed satisfaction.  -- Lynden Oxford, PA-C [facility] 12/07/2019, 7:22 AM [phone] M-F: 7am - 4pm

## 2019-12-07 NOTE — Progress Notes (Signed)
Mobility Specialist - Progress Note   12/07/19 1700  Mobility  Activity Contraindicated/medical hold  Mobility performed by Mobility specialist     Upon arriving to room, pt was preparing to go OOF for imaging. Will attempt session at another date/time when pt is readily available.    Filiberto Pinks Mobility Specialist 12/07/19, 5:02 PM

## 2019-12-07 NOTE — Progress Notes (Signed)
Pt mother called RN for pain medication for pt - this RN administered PRN PO pain medication and educated pt and mother on the need for an oral regimen to help control pain instead of just IV dilaudid. Pt mother stated "that doesn't work for her". Also educated on the effects of too much pain medication following her procedure where she had fentanyl. Pt laying in bed with eyes closed, agreeable to PO pain meds rating pain 9/10

## 2019-12-07 NOTE — Procedures (Signed)
Interventional Radiology Procedure Note  Procedure: CT Guided Drainage of LLQ abscess  Complications: None  Estimated Blood Loss: < 10 mL  Findings: 12 Fr drain placed in LLQ gas collection. After decompression of air, return of sparse bloody fluid. Drain attached to suction bulb drainage.  Will follow.  Jodi Marble. Fredia Sorrow, M.D Pager:  [phone]

## 2019-12-07 NOTE — Consult Note (Signed)
Chief Complaint: Diverticulitis with intra abdominal abscess and fluid collection. Request is for LLQ abscess drain placement  Referring Physician(s): Dr. Toula Moos  Supervising Physician: Irish Lack  Patient Status: [facility] - In-pt  History of Present Illness: Amanda Reyes is a 34 y.o. female History of HTN, morbid obesity, choledocholithiasis s/p cholecystectomy in 2014. presented to the ED at [facility] with persistent abdominal pain x 2 -3 weeks with dysuria. Found to have a diverticulitis with peritoneal abscess. IR placed a pelvic abscess drain via transgluteal approach on 9.3.21. (Drain removed on 9.22.21) On 9.8.21 the patient had a robotic lysis of adhesions, washout and a surgical drain placement to the pelvic abscess. Due to ongoing fevers and leukocytosis IR placed a RUQ drain on 9.17.21.  CT abd pelvis from 9.25.21 reads Mild diverticulitis is again seen involving the  sigmoid colon, without significant change. Drain remains in place with the tip in the inferior right pelvis. Extraluminal gas collections are again seen in the sigmoid mesocolon, with largest measuring 7.3 cm on image 69/2 compared to 6.9 cm previously. This is consistent with localized perforation. A small amount of free intraperitoneal air is noted but decreased since previous study. Generalized gaseous distention of small large bowel is unchanged, consistent with adynamic ileus. Team is requesting an intra abdominal abscess drain placement to the LLQ air pocket. Team is concerned for a possible fistula  Past Medical History:  Diagnosis Date  . Hypertension     Past Surgical History:  Procedure Laterality Date  . CHOLECYSTECTOMY N/A 08/08/2012   Procedure: LAPAROSCOPIC CHOLECYSTECTOMY;  Surgeon: Dalia Heading, MD;  Location: [facility];  Service: General;  Laterality: N/A;  . CYSTOSCOPY  11/18/2019   Procedure: CYSTOSCOPY;  Surgeon: Campbell Lerner, MD;  Location: [facility];  Service: General;;  . ERCP N/A  08/02/2012   Procedure: ENDOSCOPIC RETROGRADE CHOLANGIOPANCREATOGRAPHY (ERCP);  Surgeon: Malissa Hippo, MD;  Location: [facility];  Service: Gastroenterology;  Laterality: N/A;  . IRRIGATION AND DEBRIDEMENT ABDOMEN  11/18/2019   Procedure: IRRIGATION AND DEBRIDEMENT ABDOMEN; INTRA-ABDOMINAL/PELVIC ABCESSES AND DRAIN PLACEMENT.;  Surgeon: Campbell Lerner, MD;  Location: [facility];  Service: General;;  . PANCREATIC STENT PLACEMENT N/A 08/02/2012   Procedure: PANCREATIC STENT PLACEMENT;  Surgeon: Malissa Hippo, MD;  Location: [facility];  Service: Gastroenterology;  Laterality: N/A;  . ROBOTIC ASSISTED LAPAROSCOPIC LYSIS OF ADHESION  11/18/2019   Procedure: XI ROBOTIC ASSISTED LAPAROSCOPIC LYSIS OF ADHESION;;  Surgeon: Campbell Lerner, MD;  Location: [facility];  Service: General;;  . SPHINCTEROTOMY N/A 08/02/2012   Procedure: SPHINCTEROTOMY;  Surgeon: Malissa Hippo, MD;  Location: [facility];  Service: Gastroenterology;  Laterality: N/A;  . STONE EXTRACTION WITH BASKET N/A 08/02/2012   Procedure: STONE EXTRACTION WITH BASKET;  Surgeon: Malissa Hippo, MD;  Location: [facility];  Service: Gastroenterology;  Laterality: N/A;    Allergies: Penicillins and Tomato  Medications: Prior to Admission medications   Medication Sig Start Date End Date Taking? Authorizing Provider  losartan-hydrochlorothiazide (HYZAAR) 100-25 MG tablet Take 1 tablet by mouth daily. 03/26/19 03/25/20 Yes Willy Eddy, MD  azithromycin (ZITHROMAX Z-PAK) 250 MG tablet Take 2 tablets (500 mg) on  Day 1,  followed by 1 tablet (250 mg) once daily on Days 2 through 5. Patient not taking: Reported on 11/09/2019 12/06/17   Bridget Hartshorn L, PA-C  ondansetron (ZOFRAN) 4 MG tablet Take 1 tablet (4 mg total) by mouth daily as needed. Patient not taking: Reported on 11/09/2019 03/26/19 03/25/20  Willy Eddy, MD  History reviewed. No pertinent family history.  Social History   Socioeconomic History  . Marital status: Single    Spouse  name: Not on file  . Number of children: Not on file  . Years of education: Not on file  . Highest education level: Not on file  Occupational History  . Not on file  Tobacco Use  . Smoking status: Never Smoker  . Smokeless tobacco: Never Used  Substance and Sexual Activity  . Alcohol use: No  . Drug use: No  . Sexual activity: Never    Birth control/protection: Abstinence  Other Topics Concern  . Not on file  Social History Narrative  . Not on file   Social Determinants of Health   Financial Resource Strain:   . Difficulty of Paying Living Expenses: Not on file  Food Insecurity:   . Worried About Programme researcher, broadcasting/film/videounning Out of Food in the Last Year: Not on file  . Ran Out of Food in the Last Year: Not on file  Transportation Needs:   . Lack of Transportation (Medical): Not on file  . Lack of Transportation (Non-Medical): Not on file  Physical Activity:   . Days of Exercise per Week: Not on file  . Minutes of Exercise per Session: Not on file  Stress:   . Feeling of Stress : Not on file  Social Connections:   . Frequency of Communication with Friends and Family: Not on file  . Frequency of Social Gatherings with Friends and Family: Not on file  . Attends Religious Services: Not on file  . Active Member of Clubs or Organizations: Not on file  . Attends BankerClub or Organization Meetings: Not on file  . Marital Status: Not on file     Review of Systems: A 12 point ROS discussed and pertinent positives are indicated in the HPI above.  All other systems are negative.  Review of Systems  Constitutional: Negative for fatigue and fever.  HENT: Negative for congestion.   Respiratory: Negative for cough and shortness of breath.   Gastrointestinal: Positive for abdominal pain (RLQ by drain exit site. ). Negative for diarrhea, nausea and vomiting.    Vital Signs: BP (!) 148/101 (BP Location: Left Arm)   Pulse (!) 108   Temp 98.4 F (36.9 C) (Oral)   Resp 20   Ht 5\' 9"  (1.753 m)   Wt (!)  486 lb (220.4 kg)   LMP 11/11/2019   SpO2 94%   BMI 71.77 kg/m   Physical Exam Vitals and nursing note reviewed.  Constitutional:      Appearance: She is well-developed.  HENT:     Head: Normocephalic and atraumatic.  Eyes:     Conjunctiva/sclera: Conjunctivae normal.  Cardiovascular:     Rate and Rhythm: Regular rhythm. Tachycardia present.  Pulmonary:     Effort: Pulmonary effort is normal.     Breath sounds: Normal breath sounds.  Abdominal:     Comments: RUQ (IR) to suction with serous fluid collection. RLQ (surgical)  drain to suction with tan output  Musculoskeletal:        General: Normal range of motion.     Cervical back: Normal range of motion.  Skin:    General: Skin is warm.  Neurological:     Mental Status: She is alert and oriented to person, place, and time.     Imaging: DG Chest 1 View  Result Date: 11/09/2019 CLINICAL DATA:  LEFT-sided pain. EXAM: CHEST  1 VIEW COMPARISON:  03/25/2019 chest radiograph  FINDINGS: The cardiomediastinal silhouette is unremarkable. Mild peribronchial thickening again noted. There is no evidence of focal airspace disease, pulmonary edema, suspicious pulmonary nodule/mass, pleural effusion, or pneumothorax. No acute bony abnormalities are identified. IMPRESSION: No active disease. Electronically Signed   By: Harmon Pier M.D.   On: 11/09/2019 18:18   DG Abd 1 View  Result Date: 11/18/2019 CLINICAL DATA:  NG tube placement. EXAM: ABDOMEN - 1 VIEW COMPARISON:  11/10/2019 FINDINGS: The NG tube tip projects over the gastric antrum/pylorus. The tip is pointed distally. IMPRESSION: NG tube tip projects over the gastric antrum/pylorus. Electronically Signed   By: Katherine Mantle M.D.   On: 11/18/2019 19:28   DG Abd 1 View  Result Date: 11/10/2019 CLINICAL DATA:  Left colonic diverticulitis with evidence for perforation on CT scan yesterday. EXAM: ABDOMEN - 1 VIEW COMPARISON:  CT scan from yesterday. FINDINGS: Right-sided cubitus views of  the abdomen are limited by patient body habitus. No gross intraperitoneal free air evident. No gaseous bowel dilatation to suggest obstruction. IMPRESSION: Limited study without evidence of gross intraperitoneal free air under the non dependent abdominal wall. Patient was noted to have multiple sites of extraluminal gas in the pelvis on yesterday's CT. Electronically Signed   By: Kennith Center M.D.   On: 11/10/2019 10:52   CT ABDOMEN PELVIS W CONTRAST  Result Date: 12/05/2019 CLINICAL DATA:  Complicated perforated diverticulitis. Follow-up diverticular abscess status post percutaneous catheter drainage. EXAM: CT ABDOMEN AND PELVIS WITH CONTRAST TECHNIQUE: Multidetector CT imaging of the abdomen and pelvis was performed using the standard protocol following bolus administration of intravenous contrast. CONTRAST:  OMNIPAQUE IOHEXOL 300 MG/ML  SOLN COMPARISON:  11/25/2019 FINDINGS: Lower Chest: Stable small to moderate right pleural effusion and compressive right lower lobe atelectasis. Hepatobiliary: No hepatic masses identified. Prior cholecystectomy. No evidence of biliary obstruction. A percutaneous drainage catheter is now seen in place along the lateral margin of the right hepatic lobe. Previously seen fluid collection at this site is nearly completely resolved. Pancreas:  No mass or inflammatory changes. Spleen: Within normal limits in size and appearance. Adrenals/Urinary Tract: No masses identified. 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 No evidence of ureteral calculi or hydronephrosis. Urinary bladder is nearly completely empty with Foley catheter in place. Stomach/Bowel: Mild diverticulitis is again seen involving the sigmoid colon, without significant change. Drain remains in place with the tip in the inferior right pelvis. Extraluminal gas collections are again seen in the sigmoid mesocolon, with largest measuring 7.3 cm on image 69/2 compared to  6.9 cm previously. This is consistent with localized perforation. A small amount of free intraperitoneal air is noted but decreased since previous study. Generalized gaseous distention of small large bowel is unchanged, consistent with adynamic ileus. Vascular/Lymphatic: No pathologically enlarged lymph nodes. No abdominal aortic aneurysm. Reproductive:  No mass or other significant abnormality. Other:  Diffuse body wall edema, without significant change. Musculoskeletal:  No suspicious bone lesions identified. : Near complete resolution of right perihepatic fluid collection following percutaneous drainage catheter placement. Persistent mild sigmoid diverticulitis. Persistent extraluminal gas collections in the sigmoid mesocolon, consistent with perforation. However free intraperitoneal air has nearly completely resolved since prior exam. Adynamic ileus pattern, without significant change. Stable small to moderate right pleural effusion and compressive right lower lobe atelectasis. Stable diffuse body wall edema. Electronically Signed   By: Danae Orleans M.D.   On: 12/05/2019 13:29   CT ABDOMEN PELVIS W CONTRAST  Result Date: 11/25/2019 CLINICAL  DATA:  Leukocytosis.  Abscess drainage. EXAM: CT ABDOMEN AND PELVIS WITH CONTRAST TECHNIQUE: Multidetector CT imaging of the abdomen and pelvis was performed using the standard protocol following bolus administration of intravenous contrast. CONTRAST:  OMNIPAQUE IOHEXOL 300 MG/ML  SOLN COMPARISON:  11/17/2019.  11/12/2019.  11/09/2019. FINDINGS: Lower chest: Moderate right effusion with dependent pulmonary atelectasis. Small pleural effusion on the left. Hepatobiliary: Previous cholecystectomy. No liver parenchymal lesion. Subcapsular fluid along the lateral margin of the liver with a prominent collection at the caudal tip where it measures approximately 13 x 4.7 x 11.7 cm consistent with abscess. Pancreas: Negative Spleen: Negative Adrenals/Urinary Tract: Adrenal  glands are normal. No significant kidney finding. Bladder catheter in place. Stomach/Bowel: Cul-de-sac drainage catheter in place with resolution of abscess in that location. Additional drainage catheter in the lower anterior pelvis with less abscess fluid in that region. Organizing abscess with multiple dots of air in the anterior central abdominal mesentery. Probable interloop abscess in the central to right abdomen measuring approximately 7.8 x 6.5 x 4.4 cm. Ileus pattern with dilated fluid and air-filled loops of small intestine. Vascular/Lymphatic: Negative Reproductive: No reproductive pathology suspected. Other: Body wall edema. Musculoskeletal: No acute finding. IMPRESSION: 1. Cul-de-sac drainage catheter in place with resolution of abscess in that location. Additional drainage catheter in the lower anterior pelvis with less abscess fluid in that region. 2. Organizing abscess in the anterior central abdominal mesentery with multiple small air bubbles. Probable interloop abscess in the central to right abdomen measuring approximately 7.8 x 6.5 x 4.4 cm. 3. Subcapsular fluid along the lateral margin of the liver with a prominent collection at the caudal tip where it measures approximately 13 x 4.7 x 11.7 cm consistent with abscess. 4. Ileus pattern with dilated fluid and air-filled loops of small intestine. 5. Moderate right effusion with dependent pulmonary atelectasis. Small pleural effusion on the left. Body wall edema. Electronically Signed   By: Paulina Fusi M.D.   On: 11/25/2019 14:27   CT ABDOMEN PELVIS W CONTRAST  Addendum Date: 11/17/2019   ADDENDUM REPORT: 11/17/2019 11:48 ADDENDUM: These results were called by telephone at the time of interpretation on 11/17/2019 at 11:48 am to provider [facility] , who verbally acknowledged these results. Electronically Signed   By: Donzetta Kohut M.D.   On: 11/17/2019 11:48   Result Date: 11/17/2019 CLINICAL DATA:  Abdominal abscess/infection EXAM: CT ABDOMEN  AND PELVIS WITH CONTRAST TECHNIQUE: Multidetector CT imaging of the abdomen and pelvis was performed using the standard protocol following bolus administration of intravenous contrast. CONTRAST:  OMNIPAQUE IOHEXOL 300 MG/ML  SOLN COMPARISON:  11/12/2019 FINDINGS: Lower chest: Incidental imaging of the lung bases is unremarkable. Hepatobiliary: Signs of hepatic steatosis and lobular hepatic contours. Portal vein is patent. Hepatic veins are patent. No focal, suspicious hepatic lesion. Post cholecystectomy peer pneumobilia as on the previous exam. Pancreas: Pancreas is normal without ductal dilation or signs of inflammation. Spleen: Spleen normal in size and contour. Adrenals/Urinary Tract: Adrenal glands are normal. Symmetric renal enhancement. Low-density lesion in the upper pole of the RIGHT kidney is likely a cyst and is unchanged. No hydronephrosis. Generalized stranding throughout the pelvis about the urinary bladder in the setting of pelvic abscesses. Stomach/Bowel: Proximal small bowel dilation. Dilation is similar to the prior exam. Numerous interloop abscesses in the low abdomen. Largest area measuring 8.0 x 6.5 cm previously with gas and fluid in this area measuring approximately 4.1 x 3.9 cm. Another interloop collection in the RIGHT hemiabdomen (image  67, series 2) 5.5 x 3.8 cm, gas tracking away from this area into the LEFT lower quadrant and with gas in fluid along the LEFT anterior abdomen. Gas containing collection in the LEFT anterior abdomen measuring 8.1 x 3.7 cm, more gas in this area perhaps better organized collection and on the prior study where it measured approximately 7.9 x 3.8 cm. Signs of colonic perforation with adjacent collection, clear communication between the colonic lumen in adjacent collections in the lower abdomen is seen on image 75 of series 2 with an adjacent gas and fluid containing collection in the central pelvis (image 71, series 2) this measures approximately 4 x 2.6  cm as compared to 3.5 cm in greatest dimension on the prior study. Generalized stranding throughout the pelvis. Additional fluid which layers inferiorly, mixed with gas and extending atop the urinary bladder and from LEFT to RIGHT across the lower abdomen and pelvis (image 77, series 2) dominant collection in this area which extends inferiorly with horseshoe shaped when viewed in coronal view measuring approximately 6.6 x 5.0 cm with smaller component extending towards the RIGHT abdomen over the urinary bladder and uterine fundus. Vascular/Lymphatic: Scattered lymph nodes throughout the retroperitoneum similar to the prior exam. No signs of atherosclerotic changes. Portal vein is patent. Scattered small lymph nodes also throughout the pelvis in the setting of diffuse inflammation. Reproductive: Uterus surrounded by inflammation and pelvic fluid collections. Other: Extensive body wall edema. Generalized inflammation in the pelvis and lower abdomen. Pelvic drain in place, interval decompression of the abscess in the cul-de-sac. Musculoskeletal: No acute bone finding. No destructive bone process. IMPRESSION: 1. Signs of colonic perforation related to diverticulitis with worsening fluid and gas containing abscesses is throughout the low abdomen and pelvis many with interloop and mesenteric involvement, also with multiple areas of pelvic fluid as well showing generalized inflammation tracking to the mid abdomen. 2. Interval decompression of the abscess in the cul-de-sac following drain placement. 3. Signs of hepatic steatosis and lobular hepatic contours. 4. Post cholecystectomy with pneumobilia as on the previous exam. 5. Extensive body wall edema. 6. Aortic atherosclerosis. These results will be called to the ordering clinician or representative by the Radiologist Assistant, and communication documented in the PACS or [employer]. Aortic Atherosclerosis (ICD10-I70.0). Electronically Signed: By: Donzetta Kohut M.D.  On: 11/17/2019 11:31   CT ABDOMEN PELVIS W CONTRAST  Result Date: 11/12/2019 CLINICAL DATA:  Left lower quadrant abdominal pain, perforated diverticulitis EXAM: CT ABDOMEN AND PELVIS WITH CONTRAST TECHNIQUE: Multidetector CT imaging of the abdomen and pelvis was performed using the standard protocol following bolus administration of intravenous contrast. CONTRAST:  OMNIPAQUE IOHEXOL 300 MG/ML  SOLN COMPARISON:  11/09/2019 FINDINGS: Lower chest: Small right pleural effusion has developed with minimal right basilar compressive atelectasis. Left lung bases clear. Visualized heart and pericardium are unremarkable. Hepatobiliary: Cholecystectomy has been performed. Mild pneumobilia is present in keeping with probable prior sphincterotomy. Mild hepatomegaly is stable. No focal intrahepatic lesion identified. Pancreas: Unremarkable Spleen: Unremarkable Adrenals/Urinary Tract: Stable probable cortical cyst within the right upper pole and punctate 2 mm nonobstructing calculus within the right lower pole. The kidneys and adrenal glands are otherwise unremarkable. Foley catheter balloon is seen within a decompressed bladder lumen. Stomach/Bowel: There is progressive distension of multiple proximal loops of small bowel with gradual transition to more normal caliber small bowel within the area of inflammation within the pelvis suggesting changes of a a partial small bowel obstruction and/or developing ileus secondary to the inflammatory pelvic process.  The mid and distal small bowel as well as the colon is decompressed. There is increasing mesenteric infiltration within the pelvis, peritoneal enhancement within the pelvis, and shotty mesenteric and retroperitoneal adenopathy all in keeping with progressive inflammatory change. A loculated pericolonic gas and fluid collection has developed within the cul-de-sac in keeping with a focal abscess measuring 6.8 x 8.7 cm at axial image # 83/2. There is increasing, more  amorphous extraluminal gas and fluid within the left hemipelvis and anterior peritoneum within the hypogastric region best seen at axial image # 66/2 and 76/2, however, these do not demonstrate a well-defined brim at this time. A similar collection is noted within the deep pelvis within the leaves of the mesentery measuring roughly 3.5 cm at axial image # 72/2. The appendix is unremarkable. Vascular/Lymphatic: No frankly pathologic adenopathy within the abdomen and pelvis. The abdominal vasculature is unremarkable. Reproductive: Uterus and bilateral adnexa are unremarkable. Other: Increasing subcutaneous edema is noted within pannus and flanks bilaterally. Musculoskeletal: No acute or significant osseous findings. IMPRESSION: 1. Interval development of a 6.8 x 8.7 cm loculated pericolonic gas and fluid collection within the cul-de-sac in keeping with a focal abscess. There is increasing, more amorphous extraluminal gas and fluid within the left hemipelvis, deep pelvis and anterior peritoneum within the hypogastric region, however, these do not demonstrate a well-defined brim at this time. 2. Increasing distension of multiple proximal loops of small bowel with gradual transition to more normal caliber small bowel within the area of inflammation within the pelvis suggesting changes of a partial small bowel obstruction and/or developing ileus secondary to the inflammatory pelvic process. 3. Progressive inflammatory change within the pelvis. 4. Increasing subcutaneous edema within pannus and flanks bilaterally in keeping with developing anasarca Electronically Signed   By: Helyn Numbers MD   On: 11/12/2019 23:02   CT ABDOMEN PELVIS W CONTRAST  Result Date: 11/09/2019 CLINICAL DATA:  Abdominal pain EXAM: CT ABDOMEN AND PELVIS WITH CONTRAST TECHNIQUE: Multidetector CT imaging of the abdomen and pelvis was performed using the standard protocol following bolus administration of intravenous contrast. CONTRAST:   OMNIPAQUE IOHEXOL 300 MG/ML  SOLN COMPARISON:  None. FINDINGS: Lower chest: Lung bases demonstrate no acute consolidation or effusion. Normal cardiac size. Hepatobiliary: No focal liver abnormality is seen. Status post cholecystectomy. No biliary dilatation. Pancreas: Unremarkable. No pancreatic ductal dilatation or surrounding inflammatory changes. Spleen: Normal in size without focal abnormality. Adrenals/Urinary Tract: Adrenal glands are normal. Kidneys show no hydronephrosis. Small cyst in the upper pole of the right kidney. Foley catheter in the bladder which is decompressed. Stomach/Bowel: The stomach is nonenlarged. No dilated small bowel. Negative appendix. Prominent submucosal fat deposition at the colon consistent with chronic inflammatory process. Left colon diverticular disease. Focal wall thickening and inflammatory change at the sigmoid colon. More focal inflammatory process at the sigmoid colon with soft tissue stranding and small foci of gas, consistent with perforation. No well organized abscess at this time. Vascular/Lymphatic: Nonaneurysmal aorta.  No suspicious adenopathy. Reproductive: Uterus and bilateral adnexa are unremarkable. Other: Multiple foci of free air within the abdomen, for example series 2, image number 33, series 2, image number 37, series 2, image number 39/40, series 2, image number 50, and small focus of free air near the umbilicus. Small amount of mildly complex fluid in the pelvis without strong rim enhancement to suggest organized abscess at this time. Musculoskeletal: No acute or significant osseous findings. IMPRESSION: 1. Focal wall thickening and inflammatory change at the sigmoid colon with  extraluminal gas collection and considerable focal inflammatory change, consistent with acute perforated diverticulitis. There is a small focus of free air in the umbilical region. 2. Small pelvic fluid collection with slight increased density values though without strong rim  enhancement at this time to suggest organized pelvic abscess. Critical Value/emergent results were called by telephone at the time of interpretation on 11/09/2019 at 6:02 pm to provider [facility] , who verbally acknowledged these results. Electronically Signed   By: Jasmine Pang M.D.   On: 11/09/2019 18:02   DG Chest Port 1 View  Result Date: 12/06/2019 CLINICAL DATA:  Chest pain EXAM: PORTABLE CHEST 1 VIEW COMPARISON:  11/20/2019 FINDINGS: Cardiac shadow remains enlarged. Right-sided PICC line is again noted and stable. Endotracheal tube and gastric catheter have been removed in the interval. Persistent vascular congestion is noted although improved when compared with the prior exam. Small right-sided pleural effusion is noted. IMPRESSION: Persistent but improved vascular congestion. Small right pleural effusion is noted. Electronically Signed   By: Alcide Clever M.D.   On: 12/06/2019 11:35   DG Chest Port 1 View  Result Date: 11/20/2019 CLINICAL DATA:  Acute respiratory failure with hypoxia EXAM: PORTABLE CHEST 1 VIEW COMPARISON:  11/09/2019 FINDINGS: Endotracheal to is 3 cm above the carina. Right central line tip in the SVC. NG tube is in the stomach. Cardiomegaly with vascular congestion. Suspect mild pulmonary edema. No effusions or acute bony abnormality. IMPRESSION: Cardiomegaly with vascular congestion and probable mild pulmonary edema. Electronically Signed   By: Charlett Nose M.D.   On: 11/20/2019 02:01   CT IMAGE GUIDED DRAINAGE BY PERCUTANEOUS CATHETER  Result Date: 11/27/2019 INDICATION: Right abdominal abscess EXAM: CT-guided 12 French drainage right abdominal abscess MEDICATIONS: The patient is currently admitted to the hospital and receiving intravenous antibiotics. The antibiotics were administered within an appropriate time frame prior to the initiation of the procedure. ANESTHESIA/SEDATION: Fentanyl 1.0 mcg IV; Versed 50 mg IV Moderate Sedation Time:  12 minutes The patient was  continuously monitored during the procedure by the interventional radiology nurse under my direct supervision. COMPLICATIONS: None immediate. PROCEDURE: Informed written consent was obtained from the patient after a thorough discussion of the procedural risks, benefits and alternatives. All questions were addressed. Maximal Sterile Barrier Technique was utilized including caps, mask, sterile gowns, sterile gloves, sterile drape, hand hygiene and skin antiseptic. A timeout was performed prior to the initiation of the procedure. Previous imaging reviewed. Patient positioned right anterior oblique. Noncontrast localization CT performed. The right abdominal abscess was localized and marked. Under sterile conditions and local anesthesia, an 18 gauge 15 cm access needle was advanced into the abscess. Needle position confirmed with CT. Syringe aspiration yielded purulent fluid. Sample sent for culture. Guidewire inserted followed by tract dilatation to insert a 12 [place] drain. Drain catheter position confirmed CT. 80 cc purulent fluid aspirated. Catheter secured with Prolene suture and connected to external suction bulb. Sterile dressing applied. No immediate complication. Patient tolerated the procedure well. IMPRESSION: Successful CT-guided 12 French right abdominal abscess drain placement Electronically Signed   By: Judie Petit.  Shick M.D.   On: 11/27/2019 13:23   CT IMAGE GUIDED DRAINAGE BY PERCUTANEOUS CATHETER  Result Date: 11/17/2019 CLINICAL DATA:  Ruptured diverticulitis with peritoneal abscess formation. Largest fluid collection is within the central pelvis in the cul-de-sac. EXAM: CT GUIDED CATHETER DRAINAGE OF PELVIC PERITONEAL ABSCESS ANESTHESIA/SEDATION: 2.0 mg IV Versed 100 mcg IV Fentanyl Total Moderate Sedation Time:  28 minutes The patient's level of consciousness and physiologic  status were continuously monitored during the procedure by Radiology nursing. PROCEDURE: The procedure, risks, benefits, and  alternatives were explained to the patient. Questions regarding the procedure were encouraged and answered. The patient understands and consents to the procedure. A time out was performed prior to initiating the procedure. CT was performed through the pelvis in a prone position. The right transgluteal region was prepped with chlorhexidine in a sterile fashion, and a sterile drape was applied covering the operative field. A sterile gown and sterile gloves were used for the procedure. Local anesthesia was provided with 1% Lidocaine. Under CT guidance, an 18 gauge trocar needle was advanced to the level of the pelvic abscess. After return fluid from the needle, a guidewire was advanced. The percutaneous tract was dilated and a 10 French percutaneous drainage catheter advanced. Catheter position was confirmed by CT. A fluid sample was aspirated from the drain and sent for culture analysis. The drain was connected to a suction bulb and secured at the skin with a Prolene retention suture and StatLock device. COMPLICATIONS: None FINDINGS: Abscess in the cul-de-sac was targeted for percutaneous drainage from a trans gluteal approach. The abscess contains an air-fluid level and measures approximately 6.5 cm in transverse width. There was grossly purulent fluid return from the collection after needle puncture. After drain placement, there is good return of purulent fluid. IMPRESSION: CT-guided percutaneous catheter drainage of pelvic abscess in the cul-de-sac with return of purulent fluid. A fluid sample was sent for culture analysis. A 10 French drain was placed and attached to suction bulb drainage. Electronically Signed   By: Irish Lack M.D.   On: 11/17/2019 09:05   [place] EKG SITE RITE  Result Date: 11/17/2019 If Site Rite image not attached, placement could not be confirmed due to current cardiac rhythm.   Labs:  CBC: Recent Labs    12/04/19 0944 12/05/19 0444 12/06/19 0719 12/07/19 0501  WBC 13.8* 14.7*  18.0* 15.3*  HGB 7.0* 7.7* 7.6* 7.4*  HCT 22.7* 23.8* 24.7* 23.5*  PLT 335 346 332 324    COAGS: Recent Labs    11/09/19 1758  INR 1.1  APTT 30    BMP: Recent Labs    12/04/19 0530 12/05/19 0444 12/06/19 0518 12/07/19 0501  NA 134* 136 134* 134*  K 4.4 4.8 4.5 4.4  CL 94* 96* 96* 98  CO2 32 GLUCOSE 149* 136* 135* 125*  BUN CALCIUM 7.7* 8.1* 7.7* 7.7*  CREATININE 0.53 0.52 0.46 0.53  GFRNONAA >60 >60 >60 >60  GFRAA >60 >60 >60 >60    LIVER FUNCTION TESTS: Recent Labs    11/30/19 0354 11/30/19 0354 12/03/19 0415 12/04/19 0530 12/04/19 0944 12/07/19 0501  BILITOT 0.5  --  0.5  --  0.5 0.5  AST 24  --  46*  --  38 42*  ALT 29  --  68*  --  72* 74*  ALKPHOS 137*  --  171*  --  164* 190*  PROT 7.5  --  7.9  --  7.8 8.0  ALBUMIN 1.4*   < > 1.4* 1.5* 1.4* 1.4*   < > = values in this interval not displayed.     Assessment and Plan:  34 y.o. female in patient. History of HTN, morbid obesity, choledocholithiasis s/p cholecystectomy in 2014. presented to the ED at [facility] with persistent abdominal pain x 2 -3 weeks with dysuria. Found to have a diverticulitis with peritoneal abscess. IR placed a  pelvic abscess drain via transgluteal approach on 9.3.21. (Drain removed on 9.22.21) On 9.8.21 the patient had a robotic lysis of adhesions, washout and a surgical drain placement to the pelvic abscess. Due to ongoing fevers and leukocytosis IR placed a RUQ drain on 9.17.21.  CT abd pelvis from 9.25.21 reads Mild diverticulitis is again seen involving the  sigmoid colon, without significant change. Drain remains in place with the tip in the inferior right pelvis. Extraluminal gas collections are again seen in the sigmoid mesocolon, with largest measuring 7.3 cm on image 69/2 compared to 6.9 cm previously. This is consistent with localized perforation. A small amount of free intraperitoneal air is noted but decreased since previous study. Generalized gaseous  distention of small large bowel is unchanged, consistent with adynamic ileus. Team is requesting an intra abdominal abscess drain placement to the LLQ air pocket. Team is concerned for a possible fistula.  Per Epic output is:  RUQ - 10 ml, 10 ml, 30 ml  Per note from  Z. Shulz PA (sugery) dated  9.27.21   Continue drains;RUQ/RLQ in no rush to remove, may benefit from re-imaging prior to removing these, it is encouraging that the output has cleared,   WBC is 15.3, Hgb 7.4, AST 42, ALT 74, Alkaline phopsphatase 190. All other labs and medications are within acceptable parameters. Patient is on subcutaneous prophylactic dose of lovenox. Last dose received on 9.27.21 @ 07:56. Patient is on TPN due to ilius.  Risks and benefits discussed with the patient and the patient's mother including bleeding, infection, damage to adjacent structures, bowel perforation/fistula connection, and sepsis.  All of the patient's and mother's  questions were answered, patient and mother are agreeable to proceed. Consent signed and in chart.    Thank you for this interesting consult.  I greatly enjoyed meeting BILL MCVEY and look forward to participating in their care.  A copy of this report was sent to the requesting provider on this date.  Electronically Signed: Alene Mires, NP 12/07/2019, 9:49 AM   I spent a total of 40 Minutes    in face to face in clinical consultation, greater than 50% of which was counseling/coordinating care for LLQ intra abdominal abscess drain placement.

## 2019-12-07 NOTE — OR Nursing (Signed)
Dr Fredia Sorrow aware pt had ensure 4 hrs ago. Ok to plan to give moderate sedation, pt also Lovenox shot this am.

## 2019-12-07 NOTE — OR Nursing (Signed)
Epic failed and kept locking up during procedure: Fentanyl 50 mcg given IV at1610 Pt continued to be in pain and after time out : 1620 she received 1 mg Versed IV and Fentanyl 50 mcg IV  1625 Versed 1 mg IV 1634 Versed 1 mg IV and Fentanyl 50 mcg IV for 8/10 pain 1640 continuing to report significant pain all over Versed 1 MG and Fentnayl 50 mcg IV 1650 procedure completed.  Pt had Additional CT to rule out PE Pt transferred back to bed and transferred back to her inpt room 214. Report given to [facility] but specials recovery nurse to finish recovering her in her room until 5 pm.  12 french sheath draining bloody fluid lower left quadrant abdomin.

## 2019-12-07 NOTE — Progress Notes (Signed)
PT Cancellation Note  Patient Details Name: Amanda Reyes MRN: [id number] DOB: 1986-02-23   Cancelled Treatment:    Reason Eval/Treat Not Completed: Medical issues which prohibited therapy: Per MD note 9/27 "Recommend CTA thorax to rule out pulmonary embolism and patient is also scheduled for IR guided drain today".  Will hold PT services this date and will attempt to see pt at a future date/time as medically appropriate.     Ovidio Hanger PT, DPT 12/07/19, 4:08 PM

## 2019-12-07 NOTE — Progress Notes (Signed)
PROGRESS NOTE    Amanda Reyes  FBP:[id number] DOB: [birth year] DOA: 11/09/2019 PCP: Patient, No Pcp Per  Brief Narrative:  34 year old female 14 days postop status post operative drainage of intra-abdominal abscesses.  No evidence of perforation or frank stool.  Postoperative course complicated by hypoxic respiratory failure requiring intubation.  Now extubated and appears to be improving.  Patient is surgery primary.  Medicine on consult. Advance to soft diet per general surgery.  Patient having bowel function.  Fever noted 9/24.  WBC uptrending.  Attempted to get repeat CT abd but were unable to get patient down to radiology  BP remains elevated.  Requiring multiple labetalol doses.  Patient continues to be hypertensive, tachycardic, spike intermittent low-grade temperatures.  Etiology unclear.  Possible differentials include pain, infection, pulmonary embolism, atelectasis.    Assessment & Plan:   Active Problems:   Diverticulitis of colon with perforation   Diverticulitis of large intestine with perforation and abscess    Patient persistently tachycardic, hypertensive, tachypneic.  Poor respiratory effort.  Possible etiologies include manifestation of worsening infection, pain, pulmonary embolism.  Pulmonary embolism high on differential despite chemoprophylaxis given body habitus and essentially immobile status.  Patient has been refusing to work with physical therapy and feels she is in too much pain to exert herself in any way.  I have communicated with general surgery attending and PA.  Recommend CTA thorax to rule out pulmonary embolism.  Per surgery patient is also scheduled for IR guided drain today.    Intra-abdominal abscesses status post laparoscopic lysis of adhesions and drainage Status post drain placement initially on 11/18/2019 Drain upsized with IR on 11/27/2019 Drains with serous output Patient clinically improving Currently on meropenem and fluconazole Has  been on TPN Breakthrough fever noted 9/24 Repeat CT scan with persistent mild diverticulitis and contained perforation WBC uptrending Plan: Continue current antibiotics, meropenem and fluconazole ID following, recommendations appreciated Remainder of care per surgical primary team On TPN per surgery  Hypertension Tachycardia Breakthrough fever BP control suboptimal Patient remained persistently tachycardic Requiring multiple doses of prn labetalol Also spiking intermittent low-grade temperatures Plan: Continue amlodipine 10 mg daily Continue prn labetalol Vitals per unit protocol Attempt to treat underlying illness Repeat blood cultures as needed for fever Stress incentive spirometry use If blood pressure remains elevated after IR drain placement and PE is ruled out can consider adding a second scheduled antihypertensive.  Transaminitis Mild elevation in LFTs compared to 11/30/2019 Likely due to TPN Continue to trend LFTs Avoid hepatotoxins if possible Dose of APAP decreased to 650 q6h  Hypoxic and hypercarbic respiratory failure Obesity hypoventilation syndrome Respiratory status at or near baseline Requiring 2L oxygen Mental status at baseline Plan: Continue nightly BiPAP and as needed Supplemental oxygen as needed As needed nebs  Type 2 diabetes mellitus with hyperglycemia Sugars well controlled over interval Goal range 140-180 Continue sliding scale coverage Consider additional low-dose Lantus if glycemic control becomes an issue  Acute kidney injury Resolved Avoid nephrotoxins   Thank you for allowing [place] to participate in the care of your patient.  [facility] hospitalist service will continue to follow this patient with you.    DVT prophylaxis: Lovenox, 40 mg subcu every 12 hours Code Status: Full Family Communication: Mother at bedside Disposition Plan: Per general surgery.  Medicine on consult   Consultants:   Medicine  Infectious  disease  Procedures:   Laparoscopic lysis of adhesions and drainage of intra-abdominal abscess  Drain upsizing  Antimicrobials:   Meropenem  Fluconazole  Subjective: Seen and examined.  Complains of diffuse pain.  Nontoxic-appearing. Objective: Vitals:   12/06/19 2249 12/07/19 0016 12/07/19 0454 12/07/19 0755  BP: 140/86 (!) 146/82 (!) 149/75 (!) 148/101  Pulse: (!) 115 (!) 117 (!) 114 (!) 108  Resp: 20 20 20    Temp: 99 F (37.2 C) 99.9 F (37.7 C) 99.3 F (37.4 C) 98.4 F (36.9 C)  TempSrc: Oral Oral Oral Oral  SpO2: 92% 90% 95% 94%  Weight:   (!) 220.4 kg   Height:        Intake/Output Summary (Last 24 hours) at 12/07/2019 1149 Last data filed at 12/07/2019 1006 Gross per 24 hour  Intake 1340 ml  Output 1770 ml  Net -430 ml   Filed Weights   12/03/19 0423 12/05/19 0438 12/07/19 0454  Weight: (!) 198 kg (!) 224.5 kg (!) 220.4 kg    Examination:  General exam: No acute distress. Respiratory system: Decreased at bases.  Normal work of breathing.  2 L cardiovascular system: Tachycardic, regular rhythm, no murmurs Gastrointestinal system: Obese, nondistended.  Tender to palpation epigastrium.  Positive bowel sounds.  Abdominal drain in place Central nervous system: Alert and oriented. No focal neurological deficits. Extremities: Diffusely decreased muscle strength bilaterally Skin: No rashes, lesions or ulcers Psychiatry: Judgement and insight appear normal. Mood & affect appropriate.     Data Reviewed: I have personally reviewed following labs and imaging studies  CBC: Recent Labs  Lab 12/02/19 1110 12/04/19 0944 12/05/19 0444 12/06/19 0719 12/07/19 0501  WBC 13.0* 13.8* 14.7* 18.0* 15.3*  NEUTROABS  --   --   --  14.5* 11.6*  HGB 7.6* 7.0* 7.7* 7.6* 7.4*  HCT 24.5* 22.7* 23.8* 24.7* 23.5*  MCV 80.3 79.6* 76.3* 78.7* 78.1*  PLT 407* 335 346 332 324   Basic Metabolic Panel: Recent Labs  Lab 12/03/19 0415 12/04/19 0530 12/05/19 0444  12/06/19 0518 12/07/19 0501  NA 135 134* 136 134* 134*  K 4.9 4.4 4.8 4.5 4.4  CL 95* 94* 96* 96* 98  CO2 31 32 31 31 30   GLUCOSE 111* 149* 136* 135* 125*  BUN 17 16 17 19 20   CREATININE 0.50 0.53 0.52 0.46 0.53  CALCIUM 7.8* 7.7* 8.1* 7.7* 7.7*  MG 2.1 2.2 2.2 1.9 2.0  PHOS 3.8 3.6 3.8  --  4.0   GFR: Estimated Creatinine Clearance: 202 mL/min (by C-G formula based on SCr of 0.53 mg/dL). Liver Function Tests: Recent Labs  Lab 12/03/19 0415 12/04/19 0530 12/04/19 0944 12/07/19 0501  AST 46*  --  38 42*  ALT 68*  --  72* 74*  ALKPHOS 171*  --  164* 190*  BILITOT 0.5  --  0.5 0.5  PROT 7.9  --  7.8 8.0  ALBUMIN 1.4* 1.5* 1.4* 1.4*   No results for input(s): LIPASE, AMYLASE in the last 168 hours. No results for input(s): AMMONIA in the last 168 hours. Coagulation Profile: No results for input(s): INR, PROTIME in the last 168 hours. Cardiac Enzymes: No results for input(s): CKTOTAL, CKMB, CKMBINDEX, TROPONINI in the last 168 hours. BNP (last 3 results) No results for input(s): PROBNP in the last 8760 hours. HbA1C: No results for input(s): HGBA1C in the last 72 hours. CBG: Recent Labs  Lab 12/06/19 1218 12/06/19 1751 12/07/19 0013 12/07/19 0543 12/07/19 1127  GLUCAP 155* 149* 138* 119* 134*   Lipid Profile: Recent Labs    12/07/19 0501  TRIG 245*   Thyroid Function Tests: No results for input(s): TSH, T4TOTAL,  FREET4, T3FREE, THYROIDAB in the last 72 hours. Anemia Panel: No results for input(s): VITAMINB12, FOLATE, FERRITIN, TIBC, IRON, RETICCTPCT in the last 72 hours. Sepsis Labs: No results for input(s): PROCALCITON, LATICACIDVEN in the last 168 hours.  Recent Results (from the past 240 hour(s))  Aerobic/Anaerobic Culture (surgical/deep wound)     Status: None   Collection Time: 11/27/19 12:48 PM   Specimen: Abscess  Result Value Ref Range Status   Specimen Description   Final    ABSCESS ABDOMEN Performed at [facility], [address]  [place], [place] [address]    Special Requests   Final    Normal Performed at [facility], [address] [place], [place] [address]    Gram Stain   Final    MODERATE WBC PRESENT,BOTH PMN AND MONONUCLEAR MODERATE GRAM POSITIVE RODS    Culture   Final    FEW LACTOBACILLUS SPECIES RARE CANDIDA ALBICANS NO ANAEROBES ISOLATED Performed at [facility], [address] [place], [place] [address]    Report Status 12/02/2019 FINAL  Final         Radiology Studies: DG Chest Port 1 View  Result Date: 12/06/2019 CLINICAL DATA:  Chest pain EXAM: PORTABLE CHEST 1 VIEW COMPARISON:  11/20/2019 FINDINGS: Cardiac shadow remains enlarged. Right-sided PICC line is again noted and stable. Endotracheal tube and gastric catheter have been removed in the interval. Persistent vascular congestion is noted although improved when compared with the prior exam. Small right-sided pleural effusion is noted. IMPRESSION: Persistent but improved vascular congestion. Small right pleural effusion is noted. Electronically Signed   By: Alcide Clever M.D.   On: 12/06/2019 11:35        Scheduled Meds: . sodium chloride   Intravenous Once  . amLODipine  10 mg Oral Daily  . Chlorhexidine Gluconate Cloth  6 each Topical Q0600  . enoxaparin (LOVENOX) injection  40 mg Subcutaneous Q12H  . feeding supplement (ENSURE ENLIVE)  237 mL Oral TID BM  . insulin aspart  0-20 Units Subcutaneous Q6H  . mouth rinse  15 mL Mouth Rinse q12n4p  . pantoprazole (PROTONIX) IV  40 mg Intravenous Q12H  . sodium chloride flush  10-40 mL Intracatheter Q12H  . sodium chloride flush  5 mL Intracatheter Q8H   Continuous Infusions: . sodium chloride Stopped (12/04/19 0520)  . sodium chloride    . sodium chloride 10 mL/hr at 12/06/19 0300  . meropenem (MERREM) IV 1 g (12/07/19 0722)  . TPN ADULT (ION) 45 mL/hr at 12/06/19 1831  . TPN ADULT (ION)       LOS: 28 days    Time spent: 25 minutes    Tresa Moore,  MD [facility] Pager [phone]  If 7PM-7AM, please contact night-coverage 12/07/2019, 11:49 AM

## 2019-12-07 NOTE — Consult Note (Signed)
PHARMACY - TOTAL PARENTERAL NUTRITION CONSULT NOTE   Indication: Prolonged ileus  Patient Measurements: Height: 5\' 9"  (175.3 cm) Weight: (!) 220.4 kg (486 lb) IBW/kg (Calculated) : 66.2 TPN AdjBW (KG): 90.5 Body mass index is 71.77 kg/m.  Assessment: 34 year old female with PMHx of HTN admitted with concern for perforated diverticulitis with contained abscesses. Patient is POD # 18 from laparoscopic lysis of adhesions and drainage of intra-abdominal abscess.   Glucose / Insulin: A1C 5.7, BG range 119-155: 10 units resistant SSI required.  Electrolytes: Cl:Ac ratio improving, sodium trending lower, 32 ratio have improved. Corrected Ca=9.8 Renal: SCr < 1, stable Renal function remains normal, SCr < 1, stable, UO -  LFTs / TGs: LFTs WNL at baseline, ALT trending up slightly still, TG 69-->193->245 Prealbumin / albumin: <8.6 / 1.4 Intake / Output; MIVF: Continue to monitor I&Os  GI Imaging: 9/7 CT abdomen pelvis: colonic perforation related to diverticulitis with worsening fluid and gas containing abscesses is throughout the low abdomen and pelvis many with interloop and mesenteric involvement, also with multiple areas of pelvic fluid as well showing generalized inflammation tracking to the mid abdomen. Interval decompression of the abscess in the cul-de-sac following drain placement. Extensive body wall edema.  9/15 CT concerning for perihepatic fluid collection and possible abscess  Surgeries / Procedures:  9/8 Robotic assisted laparoscopic lysis of adhesions and drainage of intra-abdominal abscess  9/17 IR placement of drain in perihepatic fluid collection   Central access: 11/19/19 TPN start date: 11/19/19  Nutritional Goals  kCal: 2700 - 3000 kCal/day, Protein: >135 g/day, Fluid: > 2000 mL Goal TPN rate is 90 mL/hr (provides 140 g of protein and 2999 kcals per day)  Current Nutrition:  Advanced to full liquids on 9/22 then advanced to soft diet on 9/23 then back to clear  liquids on 9/26  Plan:   increase TPN back to full rate of 90 mL/hr at 1800 using concentrated amino acids  Nutritional components: 21% dextrose, 65 g/L amino acids, 41.5 g/L lipids, 2999 kCal  Electrolytes in TPN: 25mEq/L of Na, 10 mEq/L of K, 50mEq/L of Ca, 0 mEq/L of Mg, phosphorous 15 mmol/L, continue Cl:Ac ratio at max (currently 1 : 1.41)  Sodium increased to 57mEq/L on 9/26  Add standard MVI and trace elements to TPN  Continue resistant SSI to q6h SSI and continue to adjust as needed - 20 units insulin regular in TPN   Monitor TPN labs daily until stable then on Mon/Thurs, next in am 9/28   Amanda Reyes 12/07/2019 12:29 PM

## 2019-12-07 NOTE — Progress Notes (Signed)
Pharmacy Antibiotic Note  Amanda Reyes is a 34 y.o. female admitted on 11/09/2019 with diverticulitis of colon with perforation. Patient day 16 s/p laparoscopic lysis of adhesions and drainage of intra-abdominal abscess with further placement of 3 drains. Pharmacy has been consulted for meropenem dosing. ID is following.   Today, 9/27:  Day #19 Meropenem, day #21 abx Leukocytosis remains present (WBC 15.3)   overnight Tm 99.9 F  IR to place drain.   Cultures:  Abscess 9/17: Lactobacillus and Candida Albicans Abscess 9/3: E. Coli & Streptococcus Group C, pan-sensitive  BCx 8/30: NG, final UCx 8/30: NG, final   Scr: 0.53 Baseline QTc 8/30: 390    Plan: Continue meropenem 1g IV every 8 hours  until adequate source control is achieved. Continue to monitor renal function daily throughout therapy.    Height: 5\' 9"  (175.3 cm) Weight: (!) 220.4 kg (486 lb) IBW/kg (Calculated) : 66.2  Temp (24hrs), Avg:99.1 F (37.3 C), Min:98.4 F (36.9 C), Max:99.9 F (37.7 C)  Recent Labs  Lab 12/01/19 0515 12/02/19 1110 12/03/19 0415 12/04/19 0530 12/04/19 0944 12/05/19 0444 12/06/19 0518 12/06/19 0719 12/07/19 0501  WBC  --  13.0*  --   --  13.8* 14.7*  --  18.0* 15.3*  CREATININE   < >  --  0.50 0.53  --  0.52 0.46  --  0.53   < > = values in this interval not displayed.    Estimated Creatinine Clearance: 202 mL/min (by C-G formula based on SCr of 0.53 mg/dL).    Allergies  Allergen Reactions  . Penicillins Hives    Did it involve swelling of the face/tongue/throat, SOB, or low BP? No Did it involve sudden or severe rash/hives, skin peeling, or any reaction on the inside of your mouth or nose? Yes Did you need to seek medical attention at a hospital or doctor's office? No When did it last happen? If all above answers are "NO", may proceed with cephalosporin use..3  . Tomato Rash    Antimicrobials this admission: Meropenem 9/9 >> Fluconazole 9/18>>9/27 Anidulagungin  9/9 >>9/17 Metronidazole 8/30>>9/8  Ciprofloxacin 8/30>>9/8   Thank you for allowing pharmacy to be a part of this patient's care.  Chasmine Lender A 12/07/2019 12:49 PM

## 2019-12-07 NOTE — Progress Notes (Signed)
ID    Date of Admission:  11/09/2019  11/18/19 laparotomy Antibiotic Meropenem 9/9> anidulaungin 9/9 >9/17 Fluconazole 11/27/19>>  Culture 8/30 BC -NG 9/3 intraabdominal abscess culture   e.coli, group  Streptococcus group C 11/27/19 -RT Upper quadrant abscess culture- lactobacillus and candida albicans   ID: Amanda Reyes is a 34 y.o. female Active Problems:   Diverticulitis of colon with perforation   Diverticulitis of large intestine with perforation and abscess  PCN allergy  Buttock drain 11/13/19>>9/22 RLQ 11/18/19 RUQ drain 11/27/19 PICC 11/19/19 Foley 11/18/19  Subjective: Pt waiting to go for another drain placement    Medications:  . sodium chloride   Intravenous Once  . amLODipine  10 mg Oral Daily  . Chlorhexidine Gluconate Cloth  6 each Topical Q0600  . enoxaparin (LOVENOX) injection  40 mg Subcutaneous Q12H  . feeding supplement (ENSURE ENLIVE)  237 mL Oral TID BM  . insulin aspart  0-20 Units Subcutaneous Q6H  . mouth rinse  15 mL Mouth Rinse q12n4p  . pantoprazole (PROTONIX) IV  40 mg Intravenous Q12H  . sodium chloride flush  10-40 mL Intracatheter Q12H  . sodium chloride flush  5 mL Intracatheter Q8H    Objective: Vital signs in last 24 hours: Temp:  [98.4 F (36.9 C)-99.9 F (37.7 C)] 98.4 F (36.9 C) (09/27 0755) Pulse Rate:  [108-122] 108 (09/27 0755) Resp:  [18-22] 20 (09/27 0454) BP: (140-154)/(75-101) 148/101 (09/27 0755) SpO2:  [90 %-96 %] 94 % (09/27 0755) Weight:  [220.4 kg] 220.4 kg (09/27 0454)  PHYSICAL EXAM:  General:awake, ill, pale Tongue coated Neck: Supple, Lungs: b/l air entry Decreased bases Heart: Regular rate and rhythm, no murmur, rub or gallop. Abdomen: Soft, rt upper quadrant drain lower quadrant drain Extremities: atraumatic, no cyanosis. ++ edema.  Skin: No rashes or lesions. Or bruising Lymph: Cervical, supraclavicular normal. Neurologic: Grossly non-focal  Lab Results CBC Latest Ref Rng & Units 12/07/2019  12/06/2019 12/05/2019  WBC 4.0 - 10.5 K/uL 15.3(H) 18.0(H) 14.7(H)  Hemoglobin 12.0 - 15.0 g/dL 7.4(L) 7.6(L) 7.7(L)  Hematocrit 36 - 46 % 23.5(L) 24.7(L) 23.8(L)  Platelets 150 - 400 K/uL 324 332 346   Liver Panel Recent Labs    12/07/19 0501  PROT 8.0  ALBUMIN 1.4*  AST 42*  ALT 74*  ALKPHOS 190*  BILITOT 0.5   Imaging CT abdomen 11/25/19 Cul-de-sac drainage catheter in place with resolution of abscess in that location. Additional drainage catheter in the lower anterior pelvis with less abscess fluid in that region. 2. Organizing abscess in the anterior central abdominal mesentery with multiple small air bubbles. Probable interloop abscess in the central to right abdomen measuring approximately 7.8 x 6.5 x 4.4 cm. 3. Subcapsular fluid along the lateral margin of the liver with a prominent collection at the caudal tip where it measures approximately 13 x 4.7 x 11.7 cm consistent with abscess. 4. Ileus pattern with dilated fluid and air-filled loops of small intestine. 5. Moderate right effusion with dependent pulmonary atelectasis. Small pleural effusion on the left. Body wall edema.    Assessment/Plan: Perforated diverticulitis with extensive intra-abdominal loop abscesses Status post laparoscopy and washout.on 11/18/19 CT abdomen done9/16shows an abscess along rtlateral margin liverand organizing abscesses central abdominal mesentry - . IRplaced another drain 9/17.Culture lactobacillus and rare candida albicans -on meropenem and fluconazole As new fever- CT abdomen was done and she is going for another drain placement in the left lower quadrant air/fluid collection  Anemia- due to above- ? prbc  She needs to participate in PT, do incentive spirometry, sit up in bed/chair  Discussed with patient and mother and surgical team

## 2019-12-08 DIAGNOSIS — K651 Peritoneal abscess: Secondary | ICD-10-CM | POA: Diagnosis not present

## 2019-12-08 DIAGNOSIS — D649 Anemia, unspecified: Secondary | ICD-10-CM | POA: Diagnosis not present

## 2019-12-08 DIAGNOSIS — K5792 Diverticulitis of intestine, part unspecified, without perforation or abscess without bleeding: Secondary | ICD-10-CM | POA: Diagnosis not present

## 2019-12-08 LAB — CBC WITH DIFFERENTIAL/PLATELET
Abs Immature Granulocytes: 0.27 10*3/uL — ABNORMAL HIGH (ref 0.00–0.07)
Basophils Absolute: 0.1 10*3/uL (ref 0.0–0.1)
Basophils Relative: 1 %
Eosinophils Absolute: 0.2 10*3/uL (ref 0.0–0.5)
Eosinophils Relative: 2 %
HCT: 23.5 % — ABNORMAL LOW (ref 36.0–46.0)
Hemoglobin: 7.3 g/dL — ABNORMAL LOW (ref 12.0–15.0)
Immature Granulocytes: 2 %
Lymphocytes Relative: 12 %
Lymphs Abs: 1.5 10*3/uL (ref 0.7–4.0)
MCH: 24.3 pg — ABNORMAL LOW (ref 26.0–34.0)
MCHC: 31.1 g/dL (ref 30.0–36.0)
MCV: 78.1 fL — ABNORMAL LOW (ref 80.0–100.0)
Monocytes Absolute: 1 10*3/uL (ref 0.1–1.0)
Monocytes Relative: 8 %
Neutro Abs: 10.2 10*3/uL — ABNORMAL HIGH (ref 1.7–7.7)
Neutrophils Relative %: 75 %
Platelets: 290 10*3/uL (ref 150–400)
RBC: 3.01 MIL/uL — ABNORMAL LOW (ref 3.87–5.11)
RDW: 20.5 % — ABNORMAL HIGH (ref 11.5–15.5)
WBC: 13.3 10*3/uL — ABNORMAL HIGH (ref 4.0–10.5)
nRBC: 0.6 % — ABNORMAL HIGH (ref 0.0–0.2)

## 2019-12-08 LAB — GLUCOSE, CAPILLARY
Glucose-Capillary: 137 mg/dL — ABNORMAL HIGH (ref 70–99)
Glucose-Capillary: 143 mg/dL — ABNORMAL HIGH (ref 70–99)
Glucose-Capillary: 148 mg/dL — ABNORMAL HIGH (ref 70–99)
Glucose-Capillary: 160 mg/dL — ABNORMAL HIGH (ref 70–99)
Glucose-Capillary: 170 mg/dL — ABNORMAL HIGH (ref 70–99)

## 2019-12-08 LAB — BASIC METABOLIC PANEL
Anion gap: 7 (ref 5–15)
BUN: 19 mg/dL (ref 6–20)
CO2: 28 mmol/L (ref 22–32)
Calcium: 7.8 mg/dL — ABNORMAL LOW (ref 8.9–10.3)
Chloride: 98 mmol/L (ref 98–111)
Creatinine, Ser: 0.54 mg/dL (ref 0.44–1.00)
GFR calc Af Amer: >60 mL/min (ref >60)
GFR calc non Af Amer: >60 mL/min (ref >60)
Glucose, Bld: 156 mg/dL — ABNORMAL HIGH (ref 70–99)
Potassium: 3.8 mmol/L (ref 3.5–5.1)
Sodium: 133 mmol/L — ABNORMAL LOW (ref 135–145)

## 2019-12-08 LAB — TRIGLYCERIDES: Triglycerides: 237 mg/dL — ABNORMAL HIGH (ref <150)

## 2019-12-08 MED ORDER — TRACE MINERALS CU-MN-SE-ZN 300-55-60-3000 MCG/ML IV SOLN
INTRAVENOUS | Status: AC
  Filled 2019-12-08: qty 936

## 2019-12-08 NOTE — Progress Notes (Signed)
Physical Therapy Treatment Patient Details Name: Amanda Reyes MRN: [id number] DOB: [birth year] Today's Date: 12/08/2019    History of Present Illness Pt is 34 year old female with past medical history for morbid obesity and hypertension, admitted to ICU due to acute diverticulitis and microperforation.  Patient is s/p of robotic surgery for lysis of adhesions.    PT Comments    Pt had a low grade fever and reported being hot, but agreed to therapy. Pt was able to perform rolling with Max+3 assist and then pt came from sidelying to sit with Max +3. While sitting, pt need bilateral UE and Max A to remain sitting upright. Pt was sweating and SOB of breath but reported no dizziness. Door to poor signal quality, SpO2 could be not be determined but HR was 127 After 3 minutes pt needed to lie back down secondary to fatigue and required Max +4 to lie back down. Pt agreed to work with mobility tech after PT left. Pt will benefit from PT services in a SNF setting upon discharge to safely address deficits listed in patient problem list for decreased caregiver assistance and eventual return to PLOF.   Follow Up Recommendations  SNF     Equipment Recommendations       Recommendations for Other Services       Precautions / Restrictions Precautions Precautions: Fall Restrictions Weight Bearing Restrictions: No    Mobility  Bed Mobility Overal bed mobility: Needs Assistance Bed Mobility: Rolling Rolling: +3 for physical assistance;Max assist;+3 for safety/equipment     Sit to supine: Max assist;Total assist;+3 for physical assistance;+3 for safety/equipment   General bed mobility comments: pt required significant effort to come to sitting  Transfers                 General transfer comment: unable/ unsafe to trial   Ambulation/Gait             General Gait Details: unsafe to attempt   Stairs             Wheelchair Mobility    Modified Rankin (Stroke Patients  Only)       Balance Overall balance assessment: Needs assistance Sitting-balance support: Bilateral upper extremity supported;Feet unsupported Sitting balance-Leahy Scale: Zero Sitting balance - Comments: pt requires Max +3 to remain sitting EOB Postural control: Right lateral lean     Standing balance comment: not attempted                            Cognition Arousal/Alertness: Awake/alert Behavior During Therapy: Flat affect;WFL for tasks assessed/performed Overall Cognitive Status: Within Functional Limits for tasks assessed                                 General Comments: pt reported feeling really hot and had a low grade fever      Exercises Other Exercises Other Exercises: pt sat EOB for 3 minutes for tolerence to upright sitting    General Comments General comments (skin integrity, edema, etc.): pt had significant diaphoresis with bed mobility      Pertinent Vitals/Pain Pain Score: 8  Faces Pain Scale: Hurts whole lot Pain Location: Lower Right abdomen Pain Intervention(s): Limited activity within patient's tolerance;Repositioned;Monitored during session    Home Living                      Prior Function  PT Goals (current goals can now be found in the care plan section) Acute Rehab PT Goals Patient Stated Goal: Go home and be able to swim again PT Goal Formulation: With patient Time For Goal Achievement: 12/08/19 Potential to Achieve Goals: Fair Progress towards PT goals: Progressing toward goals    Frequency    Min 2X/week      PT Plan Current plan remains appropriate    Co-evaluation              AM-PAC PT "6 Clicks" Mobility   Outcome Measure  Help needed turning from your back to your side while in a flat bed without using bedrails?: A Lot Help needed moving from lying on your back to sitting on the side of a flat bed without using bedrails?: Total Help needed moving to and from a bed  to a chair (including a wheelchair)?: Total Help needed standing up from a chair using your arms (e.g., wheelchair or bedside chair)?: Total Help needed to walk in hospital room?: Total Help needed climbing 3-5 steps with a railing? : Total 6 Click Score: 7    End of Session Equipment Utilized During Treatment: Gait belt Activity Tolerance: Patient tolerated treatment well Patient left: in bed;with family/visitor present;Other (comment) (mobility tech present and anticipated performing more exercise if pt was up to it) Nurse Communication: Mobility status PT Visit Diagnosis: Repeated falls (R29.6);Muscle weakness (generalized) (M62.81);Difficulty in walking, not elsewhere classified (R26.2) Pain - Right/Left: Right     Time: 2297-9892 PT Time Calculation (min) (ACUTE ONLY): 28 min  Charges:                        Nicolette Bang, SPT 12/08/19. 3:20 PM

## 2019-12-08 NOTE — Consult Note (Signed)
PHARMACY - TOTAL PARENTERAL NUTRITION CONSULT NOTE   Indication: Prolonged ileus  Patient Measurements: Height: 5\' 9"  (175.3 cm) Weight: (!) 220.4 kg (486 lb) IBW/kg (Calculated) : 66.2 TPN AdjBW (KG): 90.5 Body mass index is 71.77 kg/m.  Assessment: 34 year old female with PMHx of HTN admitted with concern for perforated diverticulitis with contained abscesses. Patient is POD # 20 from laparoscopic lysis of adhesions and drainage of intra-abdominal abscess.   Glucose / Insulin: A1C 5.7, BG range 112 - 156: 10 units resistant SSI required.  Electrolytes: Cl:Ac ratio improving, sodium trending lower, 32 ratio have improved, sodium and potassium trending down Renal: SCr < 1, stable Renal function remains normal, SCr < 1, stable, UO -  LFTs / TGs: LFTs WNL at baseline, ALT trending up slightly still, TG 69-->193->245 Prealbumin / albumin: <8.6 / 1.4 Intake / Output; MIVF: Continue to monitor I&Os  GI Imaging: 9/7 CT abdomen pelvis: colonic perforation related to diverticulitis with worsening fluid and gas containing abscesses is throughout the low abdomen and pelvis many with interloop and mesenteric involvement, also with multiple areas of pelvic fluid as well showing generalized inflammation tracking to the mid abdomen. Interval decompression of the abscess in the cul-de-sac following drain placement. Extensive body wall edema.  9/15 CT concerning for perihepatic fluid collection and possible abscess 9/28 CT-guided percutaneous drainage of left lower quadrant abscess containing mostly gas and also yielding some bloody fluid  Surgeries / Procedures:  9/8 Robotic assisted laparoscopic lysis of adhesions and drainage of intra-abdominal abscess  9/17 IR placement of drain in perihepatic fluid collection  9/27 IR drain of LLQ abscess  Central access: 11/19/19 TPN start date: 11/19/19  Nutritional Goals  kCal: 2700 - 3000 kCal/day, Protein: >135 g/day, Fluid: > 2000 mL Goal TPN  rate is 90 mL/hr (provides 140 g of protein and 2999 kcals per day)  Current Nutrition:  Advanced to full liquids on 9/22 then advanced to soft diet on 9/23 then back to clear liquids on 9/26  Plan:   Continue TPN at 90 mL/hr using concentrated amino acids  Nutritional components: 21% dextrose, 65 g/L amino acids, 41.5 g/L lipids, 2999 kCal  Electrolytes in TPN: 138mEq/L of Na, 15 mEq/L of K, 77mEq/L of Ca, 0 mEq/L of Mg, phosphorous 15 mmol/L, continue Cl:Ac ratio at max (currently 1.7 : 1)  Sodium increased to 100 mEq/L today  Potassium increased to 15 mEq/L today  Add standard MVI and trace elements to TPN  Continue resistant SSI to q6h SSI and continue to adjust as needed - 20 units insulin regular in TPN   Monitor TPN labs daily until stable then on Mon/Thurs, next in am 9/29   10/29 12/08/2019 7:04 AM

## 2019-12-08 NOTE — Progress Notes (Signed)
Nutrition Follow-up  DOCUMENTATION CODES:   Morbid obesity  INTERVENTION:  TPN per pharmacy  Continue Ensure Enlive po TID, each supplement provides 350 kcal and 20 grams of protein (strawberry) per MD  [facility] updated with pt prefrences  NUTRITION DIAGNOSIS:   Inadequate oral intake related to inability to eat as evidenced by NPO status. -progressing, diet advanced to clears  GOAL:   Patient will meet greater than or equal to 90% of their needs -meeting with TPN   MONITOR:   PO intake, Supplement acceptance, Diet advancement, Labs, Weight trends, Skin, I & O's  REASON FOR ASSESSMENT:   Ventilator, Consult New TPN/TNA  ASSESSMENT:   34 year old female with PMHx of HTN admitted with perforated diverticulitis with contained abscesses.  9/8 s/p robotic lysis of adhesions with irrigation and debridement of multiple intra-abdominal/peritoneal/pelvis abscesses with drain placement  Pt tolerating TPN well at goal rate, diet downgraded to clears on 9/26.  Pt reports drinking some apple juice this morning and tolerating clear liquid diet. Mother states that pt does not tolerate grape juice and requested it not be sent on trays. RD has updated preference in [facility]. Observed Ensure sitting on ice at bedside tray, informed pt Ensure not part of CLD and offered Boost Breeze supplement. Mother stated MD allowing pt to continue drinking Ensure while on clears. RD spoke with PA via secure chat, confirmed Ensure supplement is fine to drink.   Admit wt 211 kg (464.2 lb)      Current wt 220.4 kg (484.88 lb) Net +7.3 L   Medications reviewed and include: Protonix IVPB: Merrem TPN @ 90 ml/hr Labs: CBGs 160,170,143,112, Na 133 (L), TGs 233 (H)   Diet Order:   Diet Order            Diet clear liquid Room service appropriate? Yes; Fluid consistency: Thin  Diet effective now                 EDUCATION NEEDS:   No education needs have been identified at this time  Skin:   Skin Assessment: Skin Integrity Issues: (closed incisions to abdomen and vagina)  Last BM:  9/27-type 7  Height:   Ht Readings from Last 1 Encounters:  11/09/19 5\' 9"  (1.753 m)    Weight:   Wt Readings from Last 1 Encounters:  12/07/19 (!) 220.4 kg    Ideal Body Weight:  65.9 kg  BMI:  Body mass index is 71.77 kg/m.  Estimated Nutritional Needs:   Kcal:  2700-3000kcal/day  Protein:  >135g/day  Fluid:  >/= 2 L/day    12/09/19, RD, LDN Clinical Nutrition After Hours/Weekend Pager # in Amion

## 2019-12-08 NOTE — Progress Notes (Signed)
Patient and her mother were informed of plan to move patient to room 221 and put patient on a size wise bed . Patient and her mother were both agreeable to the move. Anselm Jungling

## 2019-12-08 NOTE — Progress Notes (Signed)
ID  Date of Admission:  11/09/2019  11/18/19 laparotomy Antibiotic Meropenem 9/9> anidulaungin 9/9 >9/17 Fluconazole 11/27/19>>12/07/19  Culture 8/30 BC -NG 9/3 intraabdominal abscess culture   e.coli,   Streptococcus group C 11/27/19 -RT Upper quadrant abscess culture- lactobacillus and candida albicans    Pt had a new drain placed left lower quadrant Has rt upper quadrant and rt lower quadrant drain Looks ill Pale As per mom she is havingn menstrual cramps  Patient Vitals for the past 24 hrs:  BP Temp Temp src Pulse Resp SpO2  12/08/19 1145 (!) 143/84 100.1 F (37.8 C) Oral (!) 117 (!) 22 92 %  12/08/19 0625 (!) 137/93 98.7 F (37.1 C) -- (!) 110 (!) 26 96 %  12/08/19 0105 119/87 99.8 F (37.7 C) Oral (!) 115 (!) 25 100 %  12/07/19 1949 (!) 129/98 98.4 F (36.9 C) -- (!) 117 (!) 27 98 %  ill, pale grunting Chest b/l air entry Decreased abses HSs1s2 abd soft Edema of the abdominal wall  Foley -11/18/19 PICC  Rt arm 11/19/19 Rt upper quadrant drain -11/27/19 Rt LQ drain 11/18/19 Left LQ drain 12/07/19 Rectal tube  CBC Latest Ref Rng & Units 12/08/2019 12/07/2019 12/06/2019  WBC 4.0 - 10.5 K/uL 13.3(H) 15.3(H) 18.0(H)  Hemoglobin 12.0 - 15.0 g/dL 7.3(L) 7.4(L) 7.6(L)  Hematocrit 36 - 46 % 23.5(L) 23.5(L) 24.7(L)  Platelets 150 - 400 K/uL 290 324 332    CMP Latest Ref Rng & Units 12/08/2019 12/07/2019 12/06/2019  Glucose 70 - 99 mg/dL 810(F) 751(W) 258(N)  BUN 6 - 20 mg/dL 19 20 19   Creatinine 0.44 - 1.00 mg/dL 2.77 8.24  Sodium 135 - 145 mmol/L 133(L) 134(L) 134(L)  Potassium 3.5 - 5.1 mmol/L 3.8 4.4 4.5  Chloride 98 - 111 mmol/L 98 98 96(L)  CO2 22 - 32 mmol/L 28 30 31   Calcium 8.9 - 10.3 mg/dL 7.8(L) 7.7(L) 7.7(L)  Total Protein 6.5 - 8.1 g/dL - 8.0 -  Total Bilirubin 0.3 - 1.2 mg/dL - 0.5 -  Alkaline Phos 38 - 126 U/L - 190(H) -  AST 15 - 41 U/L - 42(H) -  ALT 0 - 44 U/L - 74(H) -    Impression/recommedation Perforated diverticulitis with extensive  intra-abdominal loop abscesses- admitted 11/09/19 Status post laparoscopy and washout.on 11/18/19 CT abdomen done9/16shows an abscess along rtlateral margin liverand organizing abscesses central abdominal mesentry - . IRplaced another drain9/17.Culturelactobacillus and rare candida albicans -on meropenem and fluconazole. ( fluconazole stopped 9/27) As new fever- CT abdomen was done a new drain placed on 12/07/19  Anemia- Hb 7.3  Morbid obesity   She needs to participate in PT, do incentive spirometry, sit up in bed/chair  Discussed with patient and mother and her nurse

## 2019-12-08 NOTE — Progress Notes (Signed)
Mobility Specialist - Progress Note   12/08/19 1448  Mobility  Activity Dangled on edge of bed (supine exercises)  Range of Motion/Exercises All extremities (ankle pumps, SLR, Hip add/abd, Arm raises, elbow ext/flex)  Level of Assistance Dependent, patient does less than 25%  Assistive Device None  Distance Ambulated (ft) 0 ft  Mobility Response Tolerated well  Mobility performed by Mobility specialist  $Mobility charge 1 Mobility    Pre-mobility: 118 HR, 138/88 BP, 100% SpO2 During mobility: 127 HR Post-mobility: 118 HR, 139/97 BP, 100% SpO2   Pt was lying in bed upon arrival with mother present in room. Pt agreed to session. Pt c/o feeling hot, per chart review, pt was running a low grade fever earlier this date. After VS were taken, PT entered room. Mobility assisted in PT session. Pt was able to dangle EOB with MaxA +3-4. Pt presented difficulty in self-support to hold upright posture and balance. Pt transferred from EOB-supine with total care +4, nurse tech entered to assist with bed mobility. PT session ended and pt was motivated to continue session with mobility. Pt was able to perform bed exercises: straight leg raises, hip add/abd (10x/leg), ankle pumps (15x/leg), arm raises, and elbow ext/flex (10x/arm) with modA. Overall, pt tolerated session well. Pt was left in bed with all needs in reach.    Filiberto Pinks Mobility Specialist 12/08/19, 2:59 PM

## 2019-12-08 NOTE — Progress Notes (Signed)
Referring Physician(s): Rodenberg,D  Supervising Physician: Suttle,D  Patient Status:  [facility] - In-pt  Chief Complaint:  Abdominal pain/abscesses  Subjective: Patient doing fairly well; she does report some soreness at abd drain insertion sites; denies N/V   Allergies: Penicillins and Tomato  Medications: Prior to Admission medications   Medication Sig Start Date End Date Taking? Authorizing Provider  losartan-hydrochlorothiazide (HYZAAR) 100-25 MG tablet Take 1 tablet by mouth daily. 03/26/19 03/25/20 Yes Willy Eddy, MD  azithromycin (ZITHROMAX Z-PAK) 250 MG tablet Take 2 tablets (500 mg) on  Day 1,  followed by 1 tablet (250 mg) once daily on Days 2 through 5. Patient not taking: Reported on 11/09/2019 12/06/17   Bridget Hartshorn L, PA-C  ondansetron (ZOFRAN) 4 MG tablet Take 1 tablet (4 mg total) by mouth daily as needed. Patient not taking: Reported on 11/09/2019 03/26/19 03/25/20  Willy Eddy, MD     Vital Signs: BP (!) 137/93 (BP Location: Left Arm)   Pulse (!) 110   Temp 98.7 F (37.1 C)   Resp (!) 26   Ht 5\' 9"  (1.753 m)   Wt (!) 486 lb (220.4 kg)   LMP 11/11/2019   SpO2 96%   BMI 71.77 kg/m   Physical Exam awake, alert; right upper, right lower and left lower abdominal drains intact; output from right upper quadrant 10 cc serous fluid, right lower quadrant 70 cc cream-colored fluid and  left lower quadrant 30 cc blood-tinged fluid  Imaging: CT ANGIO CHEST PE W OR WO CONTRAST  Addendum Date: 12/07/2019   ADDENDUM REPORT: 12/07/2019 17:38 ADDENDUM: Additional finding: 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 Electronically Signed   By: 12/09/2019 M.D.   On: 12/07/2019 17:38   Result Date: 12/07/2019 CLINICAL DATA:  Short of breath rule out pulmonary embolism. EXAM: CT ANGIOGRAPHY CHEST WITH CONTRAST TECHNIQUE: Multidetector CT imaging of the chest was performed using the standard protocol during bolus administration of  intravenous contrast. Multiplanar CT image reconstructions and MIPs were obtained to evaluate the vascular anatomy. CONTRAST:  12/09/2019 OMNIPAQUE IOHEXOL 350 MG/ML SOLN COMPARISON:  Chest 12/06/2019 FINDINGS: Cardiovascular: Negative for pulmonary embolism. Pulmonary arteries normal in caliber. Heart size mildly enlarged. Normal thoracic aorta. Mediastinum/Nodes: Negative for mass or adenopathy. Lungs/Pleura: Moderately large right pleural effusion with right lower lobe compressive atelectasis. Minimal left effusion. Negative for pneumonia. Upper Abdomen: Hepatomegaly with fatty infiltration. Musculoskeletal: No acute abnormality. Review of the MIP images confirms the above findings. IMPRESSION: Negative for pulmonary embolism Moderately large right pleural effusion with compressive atelectasis right lower lobe. Minimal left pleural effusion Hepatomegaly with fatty infiltration of the liver. Electronically Signed: By: 12/08/2019 M.D. On: 12/07/2019 17:21   CT ABDOMEN PELVIS W CONTRAST  Result Date: 12/05/2019 CLINICAL DATA:  Complicated perforated diverticulitis. Follow-up diverticular abscess status post percutaneous catheter drainage. EXAM: CT ABDOMEN AND PELVIS WITH CONTRAST TECHNIQUE: Multidetector CT imaging of the abdomen and pelvis was performed using the standard protocol following bolus administration of intravenous contrast. CONTRAST:  12/07/2019 OMNIPAQUE IOHEXOL 300 MG/ML  SOLN COMPARISON:  11/25/2019 FINDINGS: Lower Chest: Stable small to moderate right pleural effusion and compressive right lower lobe atelectasis. Hepatobiliary: No hepatic masses identified. Prior cholecystectomy. No evidence of biliary obstruction. A percutaneous drainage catheter is now seen in place along the lateral margin of the right hepatic lobe. Previously seen fluid collection at this site is nearly completely resolved. Pancreas:  No mass or inflammatory changes. Spleen: Within normal limits in size and appearance.  Adrenals/Urinary Tract:  No masses identified. 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 No evidence of ureteral calculi or hydronephrosis. Urinary bladder is nearly completely empty with Foley catheter in place. Stomach/Bowel: Mild diverticulitis is again seen involving the sigmoid colon, without significant change. Drain remains in place with the tip in the inferior right pelvis. Extraluminal gas collections are again seen in the sigmoid mesocolon, with largest measuring 7.3 cm on image 69/2 compared to 6.9 cm previously. This is consistent with localized perforation. A small amount of free intraperitoneal air is noted but decreased since previous study. Generalized gaseous distention of small large bowel is unchanged, consistent with adynamic ileus. Vascular/Lymphatic: No pathologically enlarged lymph nodes. No abdominal aortic aneurysm. Reproductive:  No mass or other significant abnormality. Other:  Diffuse body wall edema, without significant change. Musculoskeletal:  No suspicious bone lesions identified. : Near complete resolution of right perihepatic fluid collection following percutaneous drainage catheter placement. Persistent mild sigmoid diverticulitis. Persistent extraluminal gas collections in the sigmoid mesocolon, consistent with perforation. However free intraperitoneal air has nearly completely resolved since prior exam. Adynamic ileus pattern, without significant change. Stable small to moderate right pleural effusion and compressive right lower lobe atelectasis. Stable diffuse body wall edema. Electronically Signed   By: Danae Orleans M.D.   On: 12/05/2019 13:29   DG Chest Port 1 View  Result Date: 12/06/2019 CLINICAL DATA:  Chest pain EXAM: PORTABLE CHEST 1 VIEW COMPARISON:  11/20/2019 FINDINGS: Cardiac shadow remains enlarged. Right-sided PICC line is again noted and stable. Endotracheal tube and gastric catheter have been removed in the interval.  Persistent vascular congestion is noted although improved when compared with the prior exam. Small right-sided pleural effusion is noted. IMPRESSION: Persistent but improved vascular congestion. Small right pleural effusion is noted. Electronically Signed   By: Alcide Clever M.D.   On: 12/06/2019 11:35   CT IMAGE GUIDED DRAINAGE BY PERCUTANEOUS CATHETER  Result Date: 12/08/2019 CLINICAL DATA:  Ruptured diverticulitis with prior percutaneous catheter drainage procedures and surgery. 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 EXAM: CT GUIDED CATHETER DRAINAGE OF PERITONEAL ABSCESS ANESTHESIA/SEDATION: 4.0 mg IV Versed 200 mcg IV Fentanyl Total Moderate Sedation Time:  40 minutes The patient's level of consciousness and physiologic status were continuously monitored during the procedure by Radiology nursing. PROCEDURE: The procedure, risks, benefits, and alternatives were explained to the patient. Questions regarding the procedure were encouraged and answered. The patient understands and consents to the procedure. A time out was performed prior to initiating the procedure. CT was performed in a supine position with the left side rolled up slightly through the lower abdomen and pelvis. The left abdominal wall was prepped with chlorhexidine in a sterile fashion, and a sterile drape was applied covering the operative field. A sterile gown and sterile gloves were used for the procedure. Local anesthesia was provided with 1% Lidocaine. An 18 gauge trocar needle was advanced under CT guidance to the level of a left lower quadrant abscess. After confirming needle tip position, a guidewire was advanced. The needle was removed and the percutaneous tract dilated to 12 [place]. A 12 French percutaneous drain was then advanced over the wire. Drain  position was confirmed by CT. The drain was then connected to a suction bulb. The drainage catheter was secured at the skin with a Prolene retention suture  and StatLock device. COMPLICATIONS: None FINDINGS: Again localized is a primarily air-filled cavity in the left lower quadrant measuring up to nearly 8 cm in maximal diameter. After drain placement, there was immediate evacuation air with return of some bloody fluid. Postprocedural CT shows good drainage catheter positioning with complete decompression of the air component of the abscess. IMPRESSION: CT-guided percutaneous drainage of left lower quadrant abscess containing mostly gas and also yielding some bloody fluid. A 12 French drain was placed and attached to suction bulb drainage. Electronically Signed   By: Irish Lack M.D.   On: 12/08/2019 09:17    Labs:  CBC: Recent Labs    12/05/19 0444 12/06/19 0719 12/07/19 0501 12/08/19 0611  WBC 14.7* 18.0* 15.3* 13.3*  HGB 7.7* 7.6* 7.4* 7.3*  HCT 23.8* 24.7* 23.5* 23.5*  PLT 346 332 324 290    COAGS: Recent Labs    11/09/19 1758  INR 1.1  APTT 30    BMP: Recent Labs    12/05/19 0444 12/06/19 0518 12/07/19 0501 12/08/19 0611  NA 136 134* 134* 133*  K 4.8 4.5 4.4 3.8  CL 96* 96* 98 98  CO2 31 31 30 28   GLUCOSE 136* 135* 125* 156*  BUN 17 19 20 19   CALCIUM 8.1* 7.7* 7.7* 7.8*  CREATININE 0.52 0.46 0.53 0.54  GFRNONAA >60 >60 >60 >60  GFRAA >60 >60 >60 >60    LIVER FUNCTION TESTS: Recent Labs    11/30/19 0354 11/30/19 0354 12/03/19 0415 12/04/19 0530 12/04/19 0944 12/07/19 0501  BILITOT 0.5  --  0.5  --  0.5 0.5  AST 24  --  46*  --  38 42*  ALT 29  --  68*  --  72* 74*  ALKPHOS 137*  --  171*  --  164* 190*  PROT 7.5  --  7.9  --  7.8 8.0  ALBUMIN 1.4*   < > 1.4* 1.5* 1.4* 1.4*   < > = values in this interval not displayed.    Assessment and Plan: 34 y.o. female 19 days post-op s/p robotic assisted laparoscopic lysis of adhesion and drainage of  intra-abdominal abscess WITHOUT evidence of perforation or frank stoolforperforated diverticulitis, complicated by hypoxic respiratory failure requiring post-operative intubation(since extubated); status post CT-guided drainage of pelvic cul-de-sac fluid collection 9/3 (removed 9/22), right upper abdominal abscess drain placement on 9/17 and left lower quadrant abscess drain on 9/28; patient also with right lower abdominal surgical drain; afebrile, WBC 13.3 down from 15.3, hemoglobin 7.3, creatinine normal; CT angio chest yesterday revealed no PE, moderately large right effusion, minimal left effusion, hepatomegaly with fatty infiltration of the liver; continue drain irrigation/output monitoring/lab checks; possible right upper quadrant drain removal tomorrow by surgical team if OP low; f/u CT A/P once remaining drain outputs are minimal over 2-3 day span or if clinical status worsens; may need drain injections before removal    Electronically Signed: D. 10/17, PA-C 12/08/2019, 10:53 AM   I spent a total of 15 minutes at the the patient's bedside AND on the patient's hospital floor or unit, greater than 50% of which was counseling/coordinating care for abdominal abscess drains    Patient ID: Amanda Reyes, female   DOB: 26-Jul-1985, 34 y.o.   MRN: [id number]

## 2019-12-08 NOTE — Progress Notes (Signed)
[facility] SURGICAL PROGRESS NOTE  Hospital Day(s): 29.   Post op day(s): 20 Days Post-Op.   Interval History:  Patient seen and examined no acute events or new complaints overnight.  Patient reports she is feeling better this morning, up talking on phone. Most of her abdominal pain appears to be at drain sites.  No fever, chills, nausea, emesis Leukocytosis improved to 13.3K, no fevers Renal function remains normal, sCr - 0.54, UO - 2.2L No electrolyte derangements Drain output as follows:  - RLQ:70ccs, this appears more purulent - RUQ:10ccs, serous  - LLQ: 30 ccs, serous with clot in bulb, no frank stool or purulence  Continues onMeropenem Back down to CLD; tolerating Having bowel function Working with therapies; recommending SNF  Vital signs in last 24 hours: [min-max] current  Temp:  [97.6 F (36.4 C)-99.8 F (37.7 C)] 98.7 F (37.1 C) (09/28 0625) Pulse Rate:  [108-122] 110 (09/28 0625) Resp:  [22-38] 26 (09/28 0625) BP: (119-154)/(74-114) 137/93 (09/28 0625) SpO2:  [90 %-100 %] 96 % (09/28 0625)     Height: 5\' 9"  (175.3 cm) Weight: (!) 220.4 kg BMI (Calculated): 71.74   Intake/Output last 2 shifts:  09/27 0701 - 09/28 0700 In: 2564.5 [P.O.:360; I.V.:1684.3; IV Piggyback:500.2] Out: 2310 [Urine:2200; Drains:110]   Physical Exam:  Constitutional: alert, cooperative and no distress Respiratory:tachypneic,breathing appears labored however this is relatively unchanged from previous,on [place], refusing BiPAP Cardiovascular:Tachycardicand sinus rhythm  Gastrointestinal:Obese, soft, she appears to be tender in right mid-abdomen but this is difficult to pinpoint given body habitus, non-distended, JP in RLQappears more serous, RUQ drain isserous. Rectal tube in place. Newly placed LLQ drain, output appears serous and clot in bulb Genitourinary: Foley in place Musculoskeletal: 1+ edema to bilateral  LE Integumentary:Laparoscopic incisions are CDI with dermabond, no erythema or drainage  Labs:  CBC Latest Ref Rng & Units 12/08/2019 12/07/2019 12/06/2019  WBC 4.0 - 10.5 K/uL 13.3(H) 15.3(H) 18.0(H)  Hemoglobin 12.0 - 15.0 g/dL 7.3(L) 7.4(L) 7.6(L)  Hematocrit 36 - 46 % 23.5(L) 23.5(L) 24.7(L)  Platelets 150 - 400 K/uL 290 324 332   CMP Latest Ref Rng & Units 12/08/2019 12/07/2019 12/06/2019  Glucose 70 - 99 mg/dL 12/08/2019) 161(W) 960(A)  BUN 6 - 20 mg/dL 19 20 19   Creatinine 0.44 - 1.00 mg/dL 540(J 8.11  Sodium 135 - 145 mmol/L 133(L) 134(L) 134(L)  Potassium 3.5 - 5.1 mmol/L 3.8 4.4 4.5  Chloride 98 - 111 mmol/L 98 98 96(L)  CO2 22 - 32 mmol/L 28 30 31   Calcium 8.9 - 10.3 mg/dL 7.8(L) 7.7(L) 7.7(L)  Total Protein 6.5 - 8.1 g/dL - 8.0 -  Total Bilirubin 0.3 - 1.2 mg/dL - 0.5 -  Alkaline Phos 38 - 126 U/L - 190(H) -  AST 15 - 41 U/L - 42(H) -  ALT 0 - 44 U/L - 74(H) -     Imaging studies: No new pertinent imaging studies   Assessment/Plan:  34 y.o. female 20 Days Post-Op s/p robotic assisted laparoscopic lysis of adhesion and drainage of intra-abdominal abscess WITHOUT evidence of perforation or frank stoolforperforated diverticulitis, complicated by hypoxic respiratory failure requiring post-operative intubation(since extubated).   - Continue CLD for now  - Continue TPN, NPO, Nutritional supplements              - Continue IV ABx; switched to Meropenem, andifungalin added; ID on board(Day 19) - Continue drains;Continue RLQ/LLQ drains. RUQ drain with minimal serous outut for multiple days and collection drain on recent CT -->  if output remains serous and low I will plan to remove this tomorrow (09/29).  - No plans for surgical re-intervention at this time - Monitor abdominal examination; on-going bowel function - Pain control prn; antiemetics prn -Monitor H&H; s/p transfusion 1 unit pRBC on 09/21 -  Monitor leukocytosis, renal function - Once patient can better ambulate to commode/bathroom then we can consider removing rectal tube/foley - Home medications; continue; appreciate hospitalist consultation -Mobilization encouraged as tolerated; PT/OT following; recommending SNF - DVT prophylaxis  All of the above findings and recommendations were discussed with the patient, patient's family (mother at bedside) and the medical team, and all of patient's questions were answered toher expressed satisfaction.  -- Lynden Oxford, PA-C [facility] 12/08/2019, 7:25 AM [phone] M-F: 7am - 4pm

## 2019-12-08 NOTE — Progress Notes (Signed)
PROGRESS NOTE    FAYTHE HEITZENRATER  TUU:[id number] DOB: 1985-11-24 DOA: 11/09/2019 PCP: Patient, No Pcp Per  Brief Narrative:  34 year old female 14 days postop status post operative drainage of intra-abdominal abscesses.  No evidence of perforation or frank stool.  Postoperative course complicated by hypoxic respiratory failure requiring intubation.  Now extubated and appears to be improving.  Patient is surgery primary.  Medicine on consult. Advance to soft diet per general surgery.  Patient having bowel function.  Fever noted 9/24.  WBC uptrending.  Attempted to get repeat CT abd but were unable to get patient down to radiology  BP remains elevated.  Requiring multiple labetalol doses.  IR consulted yesterday 12/07/2019 for third drain placement.  Patient appears to be responding and clinically improving.  Remains somewhat tachycardic however work of breathing appears improved.  Patient still very poor when it comes to ambulation.  No fevers noted over interval.  CTA negative for pulmonary embolism.    Assessment & Plan:   Active Problems:   Diverticulitis of colon with perforation   Diverticulitis of large intestine with perforation and abscess    Patient remains tachycardic however work of breathing appears improved and abdominal exam also less tender.  CTA negative for pulmonary embolism.  No fevers over interval.   Intra-abdominal abscesses status post laparoscopic lysis of adhesions and drainage Status post drain placement initially on 11/18/2019 Drain upsized with IR on 11/27/2019 Drains with serous output Patient clinically improving Currently on meropenem and fluconazole Has been on TPN Breakthrough fever noted 9/24 Repeat CT scan with persistent mild diverticulitis and contained perforation Status post third IR drain on 12/07/2019 Plan: Continue current antibiotics, meropenem and fluconazole ID following, recommendations appreciated Remainder of care per surgical  primary team On TPN per surgery  Hypertension Tachycardia Breakthrough fever BP control suboptimal Patient remained persistently tachycardic Requiring multiple doses of prn labetalol Breakthrough fevers appear to have improved Blood pressure control improved PE ruled out Remains tachycardic.  Suspect pain related versus immobility given body habitus Plan: Continue amlodipine 10 mg daily Continue prn labetalol Vitals per unit protocol Attempt to treat underlying illness Repeat blood cultures as needed for fever Stress incentive spirometry use  Transaminitis Mild elevation in LFTs compared to 11/30/2019 Likely due to TPN Continue to trend LFTs Avoid hepatotoxins if possible Dose of APAP decreased to 650 q6h  Hypoxic and hypercarbic respiratory failure Obesity hypoventilation syndrome Respiratory status at or near baseline Requiring 2L oxygen Mental status at baseline Plan: Continue nightly BiPAP and as needed Supplemental oxygen as needed As needed nebs  Type 2 diabetes mellitus with hyperglycemia Sugars well controlled over interval Goal range 140-180 Continue sliding scale coverage Consider additional low-dose Lantus if glycemic control becomes an issue  Acute kidney injury Resolved Avoid nephrotoxins   Thank you for allowing [place] to participate in the care of your patient.  [facility] hospitalist service will continue to follow this patient with you.    DVT prophylaxis: Lovenox, 40 mg subcu every 12 hours Code Status: Full Family Communication: Mother at bedside Disposition Plan: Per general surgery.  Medicine on consult   Consultants:   Medicine  Infectious disease  Procedures:   Laparoscopic lysis of adhesions and drainage of intra-abdominal abscess  Drain upsizing  Antimicrobials:   Meropenem  Fluconazole   Subjective: Seen and examined.  Pain control improving.  Abdominal exam improved.  Nontoxic-appearing  Objective: Vitals:   12/07/19  1949 12/08/19 0105 12/08/19 0625 12/08/19 1145  BP: (!) 129/98 119/87 Marland Kitchen)  137/93 (!) 143/84  Pulse: (!) 117 (!) 115 (!) 110 (!) 117  Resp: (!) 27 (!) 25 (!) 26 (!) 22  Temp: 98.4 F (36.9 C) 99.8 F (37.7 C) 98.7 F (37.1 C) 100.1 F (37.8 C)  TempSrc:  Oral  Oral  SpO2: 98% 100% 96% 92%  Weight:      Height:        Intake/Output Summary (Last 24 hours) at 12/08/2019 1320 Last data filed at 12/08/2019 6734 Gross per 24 hour  Intake 2444.47 ml  Output 2310 ml  Net 134.47 ml   Filed Weights   12/03/19 0423 12/05/19 0438 12/07/19 0454  Weight: (!) 198 kg (!) 224.5 kg (!) 220.4 kg    Examination:  General exam: No acute distress. Respiratory system: Decreased at bases.  Normal work of breathing.  2 L cardiovascular system: Tachycardic, regular rhythm, no murmurs Gastrointestinal system: Obese, nondistended.  Tender to palpation epigastrium.  Positive bowel sounds.  Abdominal drain in place Central nervous system: Alert and oriented. No focal neurological deficits. Extremities: Diffusely decreased muscle strength bilaterally Skin: No rashes, lesions or ulcers Psychiatry: Judgement and insight appear normal. Mood & affect appropriate.     Data Reviewed: I have personally reviewed following labs and imaging studies  CBC: Recent Labs  Lab 12/04/19 0944 12/05/19 0444 12/06/19 0719 12/07/19 0501 12/08/19 0611  WBC 13.8* 14.7* 18.0* 15.3* 13.3*  NEUTROABS  --   --  14.5* 11.6* 10.2*  HGB 7.0* 7.7* 7.6* 7.4* 7.3*  HCT 22.7* 23.8* 24.7* 23.5* 23.5*  MCV 79.6* 76.3* 78.7* 78.1* 78.1*  PLT 335 346 332 324 290   Basic Metabolic Panel: Recent Labs  Lab 12/03/19 0415 12/03/19 0415 12/04/19 0530 12/05/19 0444 12/06/19 0518 12/07/19 0501 12/08/19 0611  NA 135   < > 134* 136 134* 134* 133*  K 4.9   < > 4.4 4.8 4.5 4.4 3.8  CL 95*   < > 94* 96* 96* 98 98  CO2 31   < > 32 31 31 30 28   GLUCOSE 111*   < > 149* 136* 135* 125* 156*  BUN 17   < > 16 17 19 20 19   CREATININE  0.50   < > 0.53 0.52 0.46 0.53 0.54  CALCIUM 7.8*   < > 7.7* 8.1* 7.7* 7.7* 7.8*  MG 2.1  --  2.2 2.2 1.9 2.0  --   PHOS 3.8  --  3.6 3.8  --  4.0  --    < > = values in this interval not displayed.   GFR: Estimated Creatinine Clearance: 202 mL/min (by C-G formula based on SCr of 0.54 mg/dL). Liver Function Tests: Recent Labs  Lab 12/03/19 0415 12/04/19 0530 12/04/19 0944 12/07/19 0501  AST 46*  --  38 42*  ALT 68*  --  72* 74*  ALKPHOS 171*  --  164* 190*  BILITOT 0.5  --  0.5 0.5  PROT 7.9  --  7.8 8.0  ALBUMIN 1.4* 1.5* 1.4* 1.4*   No results for input(s): LIPASE, AMYLASE in the last 168 hours. No results for input(s): AMMONIA in the last 168 hours. Coagulation Profile: No results for input(s): INR, PROTIME in the last 168 hours. Cardiac Enzymes: No results for input(s): CKTOTAL, CKMB, CKMBINDEX, TROPONINI in the last 168 hours. BNP (last 3 results) No results for input(s): PROBNP in the last 8760 hours. HbA1C: No results for input(s): HGBA1C in the last 72 hours. CBG: Recent Labs  Lab 12/07/19 1127 12/07/19 1843  12/08/19 0027 12/08/19 0633 12/08/19 1145  GLUCAP 134* 112* 143* 170* 160*   Lipid Profile: Recent Labs    12/07/19 0501 12/08/19 0611  TRIG 245* 237*   Thyroid Function Tests: No results for input(s): TSH, T4TOTAL, FREET4, T3FREE, THYROIDAB in the last 72 hours. Anemia Panel: No results for input(s): VITAMINB12, FOLATE, FERRITIN, TIBC, IRON, RETICCTPCT in the last 72 hours. Sepsis Labs: No results for input(s): PROCALCITON, LATICACIDVEN in the last 168 hours.  No results found for this or any previous visit (from the past 240 hour(s)).       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 Electronically Signed   By: Marlan Palau M.D.   On: 12/07/2019 17:38   Result Date:  12/07/2019 CLINICAL DATA:  Short of breath rule out pulmonary embolism. EXAM: CT ANGIOGRAPHY CHEST WITH CONTRAST TECHNIQUE: Multidetector CT imaging of the chest was performed using the standard protocol during bolus administration of intravenous contrast. Multiplanar CT image reconstructions and MIPs were obtained to evaluate the vascular anatomy. CONTRAST:  OMNIPAQUE IOHEXOL 350 MG/ML SOLN COMPARISON:  Chest 12/06/2019 FINDINGS: Cardiovascular: Negative for pulmonary embolism. Pulmonary arteries normal in caliber. Heart size mildly enlarged. Normal thoracic aorta. Mediastinum/Nodes: Negative for mass or adenopathy. Lungs/Pleura: Moderately large right pleural effusion with right lower lobe compressive atelectasis. Minimal left effusion. Negative for pneumonia. Upper Abdomen: Hepatomegaly with fatty infiltration. Musculoskeletal: No acute abnormality. Review of the MIP images confirms the above findings. IMPRESSION: Negative for pulmonary embolism Moderately large right pleural effusion with compressive atelectasis right lower lobe. Minimal left pleural effusion Hepatomegaly with fatty infiltration of the liver. Electronically Signed: By: Marlan Palau M.D. On: 12/07/2019 17:21   CT IMAGE GUIDED DRAINAGE BY PERCUTANEOUS CATHETER  Result Date: 12/08/2019 CLINICAL DATA:  Ruptured diverticulitis with prior percutaneous catheter drainage procedures and surgery. 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 EXAM: CT GUIDED CATHETER DRAINAGE OF PERITONEAL ABSCESS ANESTHESIA/SEDATION: 4.0 mg IV Versed 200 mcg IV Fentanyl Total Moderate Sedation Time:  40 minutes The patient's level of consciousness and physiologic status were continuously monitored during the procedure by Radiology nursing. PROCEDURE: The procedure, risks, benefits, and  alternatives were explained to the patient. Questions regarding the procedure were encouraged and answered. The patient understands and consents to the procedure. A time out was performed prior to initiating the procedure. CT was performed in a supine position with the left side rolled up slightly through the lower abdomen and pelvis. The left abdominal wall was prepped with chlorhexidine in a sterile fashion, and a sterile drape was applied covering the operative field. A sterile gown and sterile gloves were used for the procedure. Local anesthesia was provided with 1% Lidocaine. An 18 gauge trocar needle was advanced under CT guidance to the level of a left lower quadrant abscess. After confirming needle tip position, a guidewire was advanced. The needle was removed and the percutaneous tract dilated to 12 [place]. A 12 French percutaneous drain was then advanced over the wire. Drain position was confirmed by CT. The drain was then connected to a suction bulb. The drainage catheter was secured at the skin with a Prolene retention suture and StatLock device. COMPLICATIONS: None FINDINGS: Again localized is a primarily air-filled  cavity in the left lower quadrant measuring up to nearly 8 cm in maximal diameter. After drain placement, there was immediate evacuation air with return of some bloody fluid. Postprocedural CT shows good drainage catheter positioning with complete decompression of the air component of the abscess. IMPRESSION: CT-guided percutaneous drainage of left lower quadrant abscess containing mostly gas and also yielding some bloody fluid. A 12 French drain was placed and attached to suction bulb drainage. Electronically Signed   By: Irish LackGlenn  Yamagata M.D.   On: 12/08/2019 09:17        Scheduled Meds: . sodium chloride   Intravenous Once  . amLODipine  10 mg Oral Daily  . Chlorhexidine Gluconate Cloth  6 each Topical Q0600  . enoxaparin (LOVENOX) injection  40 mg Subcutaneous Q12H  . feeding  supplement (ENSURE ENLIVE)  237 mL Oral TID BM  . insulin aspart  0-20 Units Subcutaneous Q6H  . mouth rinse  15 mL Mouth Rinse q12n4p  . pantoprazole (PROTONIX) IV  40 mg Intravenous Q12H  . sodium chloride flush  10-40 mL Intracatheter Q12H  . sodium chloride flush  5 mL Intracatheter Q8H  . sodium chloride flush  5 mL Intracatheter Q8H   Continuous Infusions: . sodium chloride    . sodium chloride 500 mL (12/08/19 0609)  . meropenem (MERREM) IV 1 g (12/08/19 1225)  . TPN ADULT (ION) 90 mL/hr at 12/08/19 0341  . TPN ADULT (ION)       LOS: 29 days    Time spent: 25 minutes    Tresa MooreSudheer B Shamiah Kahler, MD [facility] Pager [phone]  If 7PM-7AM, please contact night-coverage 12/08/2019, 1:20 PM

## 2019-12-09 DIAGNOSIS — I10 Essential (primary) hypertension: Secondary | ICD-10-CM

## 2019-12-09 DIAGNOSIS — E46 Unspecified protein-calorie malnutrition: Secondary | ICD-10-CM

## 2019-12-09 DIAGNOSIS — R7303 Prediabetes: Secondary | ICD-10-CM

## 2019-12-09 LAB — CBC WITH DIFFERENTIAL/PLATELET
Abs Immature Granulocytes: 0.19 10*3/uL — ABNORMAL HIGH (ref 0.00–0.07)
Basophils Absolute: 0.1 10*3/uL (ref 0.0–0.1)
Basophils Relative: 0 %
Eosinophils Absolute: 0.2 10*3/uL (ref 0.0–0.5)
Eosinophils Relative: 2 %
HCT: 23.2 % — ABNORMAL LOW (ref 36.0–46.0)
Hemoglobin: 7.4 g/dL — ABNORMAL LOW (ref 12.0–15.0)
Immature Granulocytes: 2 %
Lymphocytes Relative: 13 %
Lymphs Abs: 1.7 10*3/uL (ref 0.7–4.0)
MCH: 24.2 pg — ABNORMAL LOW (ref 26.0–34.0)
MCHC: 31.9 g/dL (ref 30.0–36.0)
MCV: 75.8 fL — ABNORMAL LOW (ref 80.0–100.0)
Monocytes Absolute: 1.1 10*3/uL — ABNORMAL HIGH (ref 0.1–1.0)
Monocytes Relative: 9 %
Neutro Abs: 9.5 10*3/uL — ABNORMAL HIGH (ref 1.7–7.7)
Neutrophils Relative %: 74 %
Platelets: 298 10*3/uL (ref 150–400)
RBC: 3.06 MIL/uL — ABNORMAL LOW (ref 3.87–5.11)
RDW: 20.3 % — ABNORMAL HIGH (ref 11.5–15.5)
WBC: 12.7 10*3/uL — ABNORMAL HIGH (ref 4.0–10.5)
nRBC: 0.6 % — ABNORMAL HIGH (ref 0.0–0.2)

## 2019-12-09 LAB — RENAL FUNCTION PANEL
Albumin: 1.4 g/dL — ABNORMAL LOW (ref 3.5–5.0)
Anion gap: 6 (ref 5–15)
BUN: 18 mg/dL (ref 6–20)
CO2: 28 mmol/L (ref 22–32)
Calcium: 7.8 mg/dL — ABNORMAL LOW (ref 8.9–10.3)
Chloride: 100 mmol/L (ref 98–111)
Creatinine, Ser: 0.58 mg/dL (ref 0.44–1.00)
GFR calc Af Amer: >60 mL/min (ref >60)
GFR calc non Af Amer: >60 mL/min (ref >60)
Glucose, Bld: 147 mg/dL — ABNORMAL HIGH (ref 70–99)
Phosphorus: 3.6 mg/dL (ref 2.5–4.6)
Potassium: 3.7 mmol/L (ref 3.5–5.1)
Sodium: 134 mmol/L — ABNORMAL LOW (ref 135–145)

## 2019-12-09 LAB — GLUCOSE, CAPILLARY
Glucose-Capillary: 155 mg/dL — ABNORMAL HIGH (ref 70–99)
Glucose-Capillary: 157 mg/dL — ABNORMAL HIGH (ref 70–99)
Glucose-Capillary: 159 mg/dL — ABNORMAL HIGH (ref 70–99)
Glucose-Capillary: 163 mg/dL — ABNORMAL HIGH (ref 70–99)
Glucose-Capillary: 167 mg/dL — ABNORMAL HIGH (ref 70–99)

## 2019-12-09 LAB — MAGNESIUM: Magnesium: 1.8 mg/dL (ref 1.7–2.4)

## 2019-12-09 MED ORDER — HYDROCHLOROTHIAZIDE 25 MG PO TABS
25.0000 mg | ORAL_TABLET | Freq: Every day | ORAL | Status: DC
  Administered 2019-12-09 – 2019-12-23 (×15): 25 mg via ORAL
  Filled 2019-12-09 (×15): qty 1

## 2019-12-09 MED ORDER — TRACE MINERALS CU-MN-SE-ZN 300-55-60-3000 MCG/ML IV SOLN
INTRAVENOUS | Status: AC
  Filled 2019-12-09: qty 936

## 2019-12-09 NOTE — TOC Progression Note (Signed)
Transition of Care [facility]) - Progression Note    Patient Details  Name: Amanda Reyes MRN: [id number] Date of Birth: [birth year]  [facility] CM/SW Contact  Chapman Fitch, RN Phone Number: 12/09/2019, 3:45 PM  Clinical Narrative:      Permission from PA to pursue LTACH.  Patient in agreement if an option.  She does not have a preference of local [facility].  Both [facility] and [facility] unable to offer a bed due to patients weight and level of care she will require       Expected Discharge Plan and Services                                                 Social Determinants of Health (SDOH) Interventions    Readmission Risk Interventions No flowsheet data found.

## 2019-12-09 NOTE — Consult Note (Addendum)
PHARMACY - TOTAL PARENTERAL NUTRITION CONSULT NOTE   Indication: Prolonged ileus  Patient Measurements: Height: 5\' 9"  (175.3 cm) Weight: (!) 220.4 kg (486 lb) IBW/kg (Calculated) : 66.2 TPN AdjBW (KG): 90.5 Body mass index is 71.77 kg/m.  Assessment: 34 year old female with PMHx of HTN admitted with concern for perforated diverticulitis with contained abscesses. Patient is POD # 21 from laparoscopic lysis of adhesions and drainage of intra-abdominal abscess.   Glucose / Insulin: A1C 5.7, BG range 137 - 156: 14 units resistant SSI required.  Electrolytes: Cl:Ac ratio improving, sodium trending lower, 32 ratio have improved, sodium and potassium stabilized, magnesium at lower end of range Renal: SCr < 1, stable Renal function remains normal, SCr < 1, stable, UO - 2.1L LFTs / TGs: LFTs WNL at baseline, ALT trending up slightly still, TG 69-->193->245 Prealbumin / albumin: <8.6 / 1.4 Intake / Output; MIVF: Continue to monitor I&Os  GI Imaging: 9/7 CT abdomen pelvis: colonic perforation related to diverticulitis with worsening fluid and gas containing abscesses is throughout the low abdomen and pelvis many with interloop and mesenteric involvement, also with multiple areas of pelvic fluid as well showing generalized inflammation tracking to the mid abdomen. Interval decompression of the abscess in the cul-de-sac following drain placement. Extensive body wall edema.  9/15 CT concerning for perihepatic fluid collection and possible abscess 9/28 CT-guided percutaneous drainage of left lower quadrant abscess containing mostly gas and also yielding some bloody fluid  Surgeries / Procedures:  9/8 Robotic assisted laparoscopic lysis of adhesions and drainage of intra-abdominal abscess  9/17 IR placement of drain in perihepatic fluid collection  9/27 IR drain of LLQ abscess  Central access: 11/19/19 TPN start date: 11/19/19  Nutritional Goals  kCal: 2700 - 3000 kCal/day, Protein: >135  g/day, Fluid: > 2000 mL Goal TPN rate is 90 mL/hr (provides 140 g of protein and 2999 kcals per day)  Current Nutrition:  Advanced to full liquids on 9/22 then advanced to soft diet on 9/23 then back to clear liquids on 9/26  Plan:   Continue TPN at 90 mL/hr using concentrated amino acids  Nutritional components: 21% dextrose, 65 g/L amino acids, 41.5 g/L lipids, 2999 kCal  Electrolytes in TPN: 159mEq/L of Na, 15 mEq/L of K, 35mEq/L of Ca, 5 mEq/L of Mg, phosphorous 15 mmol/L, continue Cl:Ac ratio at max (currently 1.7 : 1)  Sodium increased to 100 mEq/L 9/28  Magnesium increased to 5 mEq/L  Add standard MVI and trace elements to TPN  Continue resistant SSI to q6h SSI and continue to adjust as needed - 20 units insulin regular in TPN   Monitor TPN labs daily until stable then on Mon/Thurs, next in am 9/29   10/29 12/09/2019 7:17 AM

## 2019-12-09 NOTE — Progress Notes (Signed)
   12/09/19 1920  Assess: MEWS Score  Temp 98.6 F (37 C)  BP (!) 133/93  Pulse Rate (!) 118  ECG Heart Rate (!) 118  Resp (!) 28  Assess: MEWS Score  MEWS Temp 0  MEWS Systolic 0  MEWS Pulse 2  MEWS RR 2  MEWS LOC 0  MEWS Score 4  MEWS Score Color Red  Assess: if the MEWS score is Yellow or Red  Were vital signs taken at a resting state? Yes  Focused Assessment No change from prior assessment  Early Detection of Sepsis Score *See Row Information* Medium  MEWS guidelines implemented *See Row Information* Yes  Treat  MEWS Interventions Escalated (See documentation below)  Escalate  MEWS: Escalate Red: discuss with charge nurse/RN and provider, consider discussing with RRT  Notify: Charge Nurse/RN  Name of Charge Nurse/RN Notified Crystal RN   Date Charge Nurse/RN Notified 12/09/19  Time Charge Nurse/RN Notified 1955  Notify: Provider  Provider Name/Title Manuela Schwartz  Date Provider Notified 12/09/19  Time Provider Notified 2000  Notification Type Page  Notification Reason Other (Comment) (Red MEWS )  Response No new orders  Date of Provider Response 12/09/19  Time of Provider Response 2011  MEWS score elevated due to HR and RR. NP notified. No new orders at this time. MEWS VS implemented.

## 2019-12-09 NOTE — Progress Notes (Addendum)
[facility] SURGICAL PROGRESS NOTE  Hospital Day(s): 30.   Post op day(s): 21 Days Post-Op.   Interval History:  Patient seen and examined no acute events or new complaints overnight.  Patient reports that her abdominal pain is improving, pain remains primarily at drain site No nausea, emesis Leukocytosis improving further, down to 12.7K, no fevers Renal function remains normal, sCr - 0.58, good UO - 2.1L No electrolyte derangements Drain output as follows:              - RLQ: 30 ccs, this appears more purulent             - RUQ: 10 ccs, serous             - LLQ: 30 ccs, appears more purulent this morning Continues on Meropenem (Day 20) Back down to CLD; tolerating Having bowel function Therapies on board; recommending SNF; this does appear to be major limiting factor in her progress, as therapy is frequently cancelling due to her tachycardia (which has been present for her entire hospital stay).   Vital signs in last 24 hours: [min-max] current  Temp:  [99.1 F (37.3 C)-100.1 F (37.8 C)] 99.1 F (37.3 C) (09/29 0354) Pulse Rate:  [109-117] 109 (09/29 0354) Resp:  [22-24] 22 (09/29 0354) BP: (134-149)/(84-96) 147/88 (09/29 0354) SpO2:  [92 %-95 %] 95 % (09/29 0354)     Height: 5\' 9"  (175.3 cm) Weight: (!) 220.4 kg BMI (Calculated): 71.74   Intake/Output last 2 shifts:  09/28 0701 - 09/29 0700 In: 275 [P.O.:240; I.V.:5] Out: 2190 [Urine:2100; Drains:90]   Physical Exam:  Constitutional: alert, cooperative and no distress  Respiratory: Breathing appears to be improving, less tachypneic, still with some intermittent conversational dyspnea but again this has been her baseline here in the hospital Cardiovascular: Tachycardic and sinus rhythm  Gastrointestinal: Obese, soft, she appears to be tender in right mid-abdomen but this is difficult to pinpoint given body habitus, non-distended, JP in RLQ appears purulent, RUQ drain is serous. Rectal tube in place.  Newly placed LLQ drain, output appears more purulent this morning Genitourinary: Foley in place Musculoskeletal: 1+ edema to bilateral LE Integumentary: Laparoscopic incisions are CDI with dermabond, no erythema or drainage   Labs:  CBC Latest Ref Rng & Units 12/09/2019 12/08/2019 12/07/2019  WBC 4.0 - 10.5 K/uL 12.7(H) 13.3(H) 15.3(H)  Hemoglobin 12.0 - 15.0 g/dL 7.4(L) 7.3(L) 7.4(L)  Hematocrit 36 - 46 % 23.2(L) 23.5(L) 23.5(L)  Platelets 150 - 400 K/uL 298 290 324   CMP Latest Ref Rng & Units 12/09/2019 12/08/2019 12/07/2019  Glucose 70 - 99 mg/dL 12/09/2019) 536(U) 440(H)  BUN 6 - 20 mg/dL 18 19 20   Creatinine 0.44 - 1.00 mg/dL 474(Q 5.95  Sodium 135 - 145 mmol/L 134(L) 133(L) 134(L)  Potassium 3.5 - 5.1 mmol/L 3.7 3.8 4.4  Chloride 98 - 111 mmol/L 100 98 98  CO2 22 - 32 mmol/L 28 28 30   Calcium 8.9 - 10.3 mg/dL 7.8(L) 7.8(L) 7.7(L)  Total Protein 6.5 - 8.1 g/dL - - 8.0  Total Bilirubin 0.3 - 1.2 mg/dL - - 0.5  Alkaline Phos 38 - 126 U/L - - 190(H)  AST 15 - 41 U/L - - 42(H)  ALT 0 - 44 U/L - - 74(H)     Imaging studies: No new pertinent imaging studies   Assessment/Plan:  34 y.o. female with gradual and slow improvement 21 Days Post-Op s/p robotic assisted laparoscopic lysis of adhesion and drainage of intra-abdominal abscess  WITHOUT evidence of perforation or frank stool for perforated diverticulitis, complicated by hypoxic respiratory failure requiring post-operative intubation (since extubated).   - Continue CLD for now             - Continue TPN, NPO, Nutritional supplements              - Continue IV ABx; switched to Meropenem, andifungalin added; ID on board (Day 20)             - Continue drains; Continue RLQ/LLQ drains. Will remove RUQ drain today as output is minimal, serous, and collection resolved on recent CT.              - No plans for surgical re-intervention at this time             - Monitor abdominal examination; on-going bowel function             - Pain  control prn; antiemetics prn             - Monitor H&H; s/p transfusion 1 unit pRBC on 09/21             - Monitor leukocytosis, renal function             - Once patient can better ambulate to commode/bathroom then we can consider removing rectal tube/foley             - Home medications; continue; appreciate hospitalist consultation             - Mobilization encouraged as tolerated; PT/OT following; recommending SNF             - DVT prophylaxis   All of the above findings and recommendations were discussed with the patient, and the medical team, and all of patient's questions were answered to her expressed satisfaction.  -- Lynden Oxford, PA-C [facility] 12/09/2019, 7:17 AM [phone] M-F: 7am - 4pm  I saw and evaluated the patient.  I agree with the above documentation, exam, and plan, which I have edited where appropriate. Duanne Guess  9:32 AM

## 2019-12-09 NOTE — Progress Notes (Signed)
   12/09/19 1627  Assess: MEWS Score  Temp 98.6 F (37 C)  BP (!) 133/93  Pulse Rate (!) 119  Resp (!) 32  SpO2 96 %  O2 Device Room Air  Patient Activity (if Appropriate) In bed  Assess: MEWS Score  MEWS Temp 0  MEWS Systolic 0  MEWS Pulse 2  MEWS RR 2  MEWS LOC 0  MEWS Score 4  MEWS Score Color Red  Assess: if the MEWS score is Yellow or Red  Were vital signs taken at a resting state? Yes  Focused Assessment No change from prior assessment  Early Detection of Sepsis Score *See Row Information* Medium  MEWS guidelines implemented *See Row Information* Yes  Treat  MEWS Interventions Administered prn meds/treatments  Pain Scale 0-10  Pain Score 8  Pain Type Acute pain  Pain Location Abdomen  Pain Orientation Right;Left  Pain Descriptors / Indicators Aching  Pain Frequency Constant  Pain Onset On-going  Multiple Pain Sites No  Breathing 0  Negative Vocalization 0  Facial Expression 1  Body Language 1  Consolability 0  PAINAD Score 2  Complains of Other (Comment) (pain)  Interventions Medication (see MAR)  Take Vital Signs  Increase Vital Sign Frequency  Red: Q 1hr X 4 then Q 4hr X 4, if remains red, continue Q 4hrs  Escalate  MEWS: Escalate Red: discuss with charge nurse/RN and provider, consider discussing with RRT  Notify: Charge Nurse/RN  Name of Charge Nurse/RN Notified Olivia, RN  Date Charge Nurse/RN Notified 12/09/19  Time Charge Nurse/RN Notified 1827  Notify: Provider  Provider Name/Title Merilyn Baba  Date Provider Notified 12/09/19  Time Provider Notified 1828  Notification Type Page  Notification Reason Change in status  Response No new orders  Date of Provider Response 12/09/19  Time of Provider Response 1828  Document  Patient Outcome Stabilized after interventions (no change at this time)  Progress note created (see row info) Yes  MEWS red . MD Tonna Boehringer notified, see note.

## 2019-12-09 NOTE — Plan of Care (Addendum)
Dr. Tonna Boehringer made aware of patient vitals and MEWS score of RED change from yellow. No new orders. Pt given IV dilaudid at 1751. MD states he feels that he does not feel a need to change things at this time.   Assessed patient and no change from prior assessment. Pt denies SOB. Pt states she feels better after IV dilaudid given. Pt given ensure at this time stating dinner was not good and she didn't like it. Will continue to monitor.   Red MEWS guidelines implemented at this time.

## 2019-12-09 NOTE — Progress Notes (Signed)
PROGRESS NOTE    Amanda Reyes  ZOX:[id number] DOB: 1985-10-05 DOA: 11/09/2019 PCP: Patient, No Pcp Per   Brief Narrative: Amanda Reyes is a 33 y.o. female status post operative drainage of intra-abdominal abscesses.  No evidence of perforation or frank stool.  Postoperative course complicated by hypoxic respiratory failure requiring intubation.  Now extubated. Course continues to be complicated by recurrent fever requiring repeat drain placement and continued IV antibiotics. Currently on TPN. Significant physical debility with likely SNF discharge.   Assessment & Plan:   Active Problems:   Diverticulitis of colon with perforation   Diverticulitis of large intestine with perforation and abscess   Intra-abdominal abscesses Patient is status post laparoscopic lysis of adhesions and drainage of abscesses.  Patient also had drain placement on 9/8 upsized on 9/17 in addition to the drain placed on 9/27 for chronic fevers and LLQ abscess.  Patient was previously on ciprofloxacin and flagyl, transitioned to  meropenem and anidulafungin -> fluconazole for treatment of infection/abscess continued E. coli with evidence of few lactobacillus species and rare Candida albicans.  Fluconazole was eventually discontinued. -ID recommendations: Meropenem  Recurrent fever Secondary to above.  Currently is afebrile.  Acute respiratory failure with hypoxia and hypercapnia Patient required intubation and mechanical ventilation post surgery. Patient is currently on 2 L via nasal cannula. -Wean to room air as able  Essential hypertension Uncontrolled.  Patient is currently on amlodipine 10 mg daily.  Patient is on losartan-hydrochlorothiazide as an outpatient. -Will discontinue amlodipine secondary to tachycardia although unlikely this is the main driving factor. -Start HCTZ 25 mg daily  Tachycardia Patient has had persistent tachycardia this admission.  In setting of infection.  Patient is also  amlodipine which may be contributing but likely very minimally.  Currently not hemodynamically unstable.  No need for tight control at this time.  Malnutrition Morbid obesity Currently on TPN secondary to above. Also with evidence of hypoalbuminemia last albumin of 1.4.  Dietitian is consulted.  Patient is currently advanced to a regular diet. -General surgery recommendations for TPN/diet  Transaminitis Possibly secondary to TPN.  Currently stable.  History of cholecystectomy.  Last CT scan without evidence of biliary obstruction.  Previous fluid collection almost completely resolved on last CT (9/25)  AKI Resolved.  Pre-diabetes Patient does not appear to have a diagnosis of diabetes.  Last hemoglobin A1c of 5.7%.  Complicated by significant obesity.  Patient will benefit from Metformin use on discharge as she likely has a significant amount of insulin resistance.  Patient is currently managed on sliding scale insulin while inpatient.   DVT prophylaxis: Per primary (Lovenox) Code Status:   Code Status: Full Code Family Communication: Mother at bedside Disposition Plan: Per primary, General Surgery   Antimicrobials:  Meropenem  Anidulafungin  Fluconazole    Subjective: No fevers, chills, nausea, vomiting, abdominal pain. No dyspnea or chest pain.   Objective: Vitals:   12/08/19 2004 12/08/19 2331 12/09/19 0354 12/09/19 0850  BP: (!) 149/91 (!) 134/96 (!) 147/88 (!) 144/103  Pulse: (!) 115 (!) 116 (!) 109 (!) 109  Resp: (!) 24 (!) 22 (!) 22   Temp: 99.9 F (37.7 C) 99.5 F (37.5 C) 99.1 F (37.3 C)   TempSrc: Oral Oral Oral   SpO2: 95% 92% 95% 96%  Weight:      Height:        Intake/Output Summary (Last 24 hours) at 12/09/2019 0913 Last data filed at 12/09/2019 0900 Gross per 24 hour  Intake 585  ml  Output 2190 ml  Net -1605 ml   Filed Weights   12/03/19 0423 12/05/19 0438 12/07/19 0454  Weight: (!) 198 kg (!) 224.5 kg (!) 220.4 kg     Examination:  General exam: Appears calm and comfortable Respiratory system: Clear to auscultation. Respiratory effort normal. Cardiovascular system: S1 & S2 heard, RRR. No murmurs, rubs, gallops or clicks. Gastrointestinal system: Abdomen is obese, soft and nontender. No organomegaly or masses felt. 3 drain sites noted. Decreased bowel sounds heard. Central nervous system: Alert and oriented. No focal neurological deficits. Musculoskeletal: BLE edema. No calf tenderness Skin: No cyanosis. No rashes Psychiatry: Judgement and insight appear normal. Mood & affect appropriate.     Data Reviewed: I have personally reviewed following labs and imaging studies  CBC Lab Results  Component Value Date   WBC 12.7 (H) 12/09/2019   RBC 3.06 (L) 12/09/2019   HGB 7.4 (L) 12/09/2019   HCT 23.2 (L) 12/09/2019   MCV 75.8 (L) 12/09/2019   MCH 24.2 (L) 12/09/2019   PLT 298 12/09/2019   MCHC 31.9 12/09/2019   RDW 20.3 (H) 12/09/2019   LYMPHSABS 1.7 12/09/2019   MONOABS 1.1 (H) 12/09/2019   EOSABS 0.2 12/09/2019   BASOSABS 0.1 12/09/2019     Last metabolic panel Lab Results  Component Value Date   NA 134 (L) 12/09/2019   K 3.7 12/09/2019   CL 100 12/09/2019   CO2 28 12/09/2019   BUN 18 12/09/2019   CREATININE 0.58 12/09/2019   GLUCOSE 147 (H) 12/09/2019   GFRNONAA >60 12/09/2019   GFRAA >60 12/09/2019   CALCIUM 7.8 (L) 12/09/2019   PHOS 3.6 12/09/2019   PROT 8.0 12/07/2019   ALBUMIN 1.4 (L) 12/09/2019   BILITOT 0.5 12/07/2019   ALKPHOS 190 (H) 12/07/2019   AST 42 (H) 12/07/2019   ALT 74 (H) 12/07/2019   ANIONGAP 6 12/09/2019    CBG (last 3)  Recent Labs    12/08/19 1817 12/08/19 2334 12/09/19 0536  GLUCAP 137* 148* 157*     GFR: Estimated Creatinine Clearance: 202 mL/min (by C-G formula based on SCr of 0.58 mg/dL).  Coagulation Profile: No results for input(s): INR, PROTIME in the last 168 hours.  No results found for this or any previous visit (from the past  240 hour(s)).      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 Electronically Signed   By: Marlan Palau M.D.   On: 12/07/2019 17:38   Result Date: 12/07/2019 CLINICAL DATA:  Short of breath rule out pulmonary embolism. EXAM: CT ANGIOGRAPHY CHEST WITH CONTRAST TECHNIQUE: Multidetector CT imaging of the chest was performed using the standard protocol during bolus administration of intravenous contrast. Multiplanar CT image reconstructions and MIPs were obtained to evaluate the vascular anatomy. CONTRAST:  OMNIPAQUE IOHEXOL 350 MG/ML SOLN COMPARISON:  Chest 12/06/2019 FINDINGS: Cardiovascular: Negative for pulmonary embolism. Pulmonary arteries normal in caliber. Heart size mildly enlarged. Normal thoracic aorta. Mediastinum/Nodes: Negative for mass or adenopathy. Lungs/Pleura: Moderately large right pleural effusion with right lower lobe compressive atelectasis. Minimal left effusion. Negative for pneumonia. Upper Abdomen: Hepatomegaly with fatty infiltration. Musculoskeletal: No acute abnormality. Review of the MIP images confirms the above findings. IMPRESSION: Negative for pulmonary embolism Moderately large right pleural effusion with compressive atelectasis right lower lobe. Minimal left pleural effusion Hepatomegaly with fatty infiltration of the liver. Electronically Signed: By:  Marlan Palau M.D. On: 12/07/2019 17:21   CT IMAGE GUIDED DRAINAGE BY PERCUTANEOUS CATHETER  Result Date: 12/08/2019 CLINICAL DATA:  Ruptured diverticulitis with prior percutaneous catheter drainage procedures and surgery. 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 EXAM: CT  GUIDED CATHETER DRAINAGE OF PERITONEAL ABSCESS ANESTHESIA/SEDATION: 4.0 mg IV Versed 200 mcg IV Fentanyl Total Moderate Sedation Time:  40 minutes The patient's level of consciousness and physiologic status were continuously monitored during the procedure by Radiology nursing. PROCEDURE: The procedure, risks, benefits, and alternatives were explained to the patient. Questions regarding the procedure were encouraged and answered. The patient understands and consents to the procedure. A time out was performed prior to initiating the procedure. CT was performed in a supine position with the left side rolled up slightly through the lower abdomen and pelvis. The left abdominal wall was prepped with chlorhexidine in a sterile fashion, and a sterile drape was applied covering the operative field. A sterile gown and sterile gloves were used for the procedure. Local anesthesia was provided with 1% Lidocaine. An 18 gauge trocar needle was advanced under CT guidance to the level of a left lower quadrant abscess. After confirming needle tip position, a guidewire was advanced. The needle was removed and the percutaneous tract dilated to 12 [place]. A 12 French percutaneous drain was then advanced over the wire. Drain position was confirmed by CT. The drain was then connected to a suction bulb. The drainage catheter was secured at the skin with a Prolene retention suture and StatLock device. COMPLICATIONS: None FINDINGS: Again localized is a primarily air-filled cavity in the left lower quadrant measuring up to nearly 8 cm in maximal diameter. After drain placement, there was immediate evacuation air with return of some bloody fluid. Postprocedural CT shows good drainage catheter positioning with complete decompression of the air component of the abscess. IMPRESSION: CT-guided percutaneous drainage of left lower quadrant abscess containing mostly gas and also yielding some bloody fluid. A 12 French drain was placed and attached to  suction bulb drainage. Electronically Signed   By: Irish Lack M.D.   On: 12/08/2019 09:17        Scheduled Meds: . sodium chloride   Intravenous Once  . amLODipine  10 mg Oral Daily  . Chlorhexidine Gluconate Cloth  6 each Topical Q0600  . enoxaparin (LOVENOX) injection  40 mg Subcutaneous Q12H  . feeding supplement (ENSURE ENLIVE)  237 mL Oral TID BM  . insulin aspart  0-20 Units Subcutaneous Q6H  . mouth rinse  15 mL Mouth Rinse q12n4p  . pantoprazole (PROTONIX) IV  40 mg Intravenous Q12H  . sodium chloride flush  10-40 mL Intracatheter Q12H  . sodium chloride flush  5 mL Intracatheter Q8H  . sodium chloride flush  5 mL Intracatheter Q8H   Continuous Infusions: . sodium chloride    . sodium chloride 500 mL (12/08/19 0609)  . meropenem (MERREM) IV 1 g (12/09/19 0545)  . TPN ADULT (ION) 90 mL/hr at 12/08/19 1851     LOS: 30 days     Jacquelin Hawking, MD [facility] 12/09/2019, 9:13 AM  If 7PM-7AM, please contact night-coverage [email or web address]

## 2019-12-10 DIAGNOSIS — R7303 Prediabetes: Secondary | ICD-10-CM | POA: Diagnosis not present

## 2019-12-10 DIAGNOSIS — E46 Unspecified protein-calorie malnutrition: Secondary | ICD-10-CM | POA: Diagnosis not present

## 2019-12-10 DIAGNOSIS — D649 Anemia, unspecified: Secondary | ICD-10-CM | POA: Diagnosis not present

## 2019-12-10 DIAGNOSIS — K5720 Diverticulitis of large intestine with perforation and abscess without bleeding: Secondary | ICD-10-CM | POA: Diagnosis not present

## 2019-12-10 DIAGNOSIS — K5792 Diverticulitis of intestine, part unspecified, without perforation or abscess without bleeding: Secondary | ICD-10-CM | POA: Diagnosis not present

## 2019-12-10 DIAGNOSIS — K651 Peritoneal abscess: Secondary | ICD-10-CM | POA: Diagnosis not present

## 2019-12-10 LAB — COMPREHENSIVE METABOLIC PANEL
ALT: 103 U/L — ABNORMAL HIGH (ref 0–44)
AST: 44 U/L — ABNORMAL HIGH (ref 15–41)
Albumin: 1.5 g/dL — ABNORMAL LOW (ref 3.5–5.0)
Alkaline Phosphatase: 193 U/L — ABNORMAL HIGH (ref 38–126)
Anion gap: 8 (ref 5–15)
BUN: 19 mg/dL (ref 6–20)
CO2: 27 mmol/L (ref 22–32)
Calcium: 7.7 mg/dL — ABNORMAL LOW (ref 8.9–10.3)
Chloride: 99 mmol/L (ref 98–111)
Creatinine, Ser: 0.55 mg/dL (ref 0.44–1.00)
GFR calc Af Amer: >60 mL/min (ref >60)
GFR calc non Af Amer: >60 mL/min (ref >60)
Glucose, Bld: 147 mg/dL — ABNORMAL HIGH (ref 70–99)
Potassium: 3.9 mmol/L (ref 3.5–5.1)
Sodium: 134 mmol/L — ABNORMAL LOW (ref 135–145)
Total Bilirubin: 0.4 mg/dL (ref 0.3–1.2)
Total Protein: 8.1 g/dL (ref 6.5–8.1)

## 2019-12-10 LAB — CBC WITH DIFFERENTIAL/PLATELET
Abs Immature Granulocytes: 0.24 10*3/uL — ABNORMAL HIGH (ref 0.00–0.07)
Basophils Absolute: 0.1 10*3/uL (ref 0.0–0.1)
Basophils Relative: 0 %
Eosinophils Absolute: 0.2 10*3/uL (ref 0.0–0.5)
Eosinophils Relative: 2 %
HCT: 23.1 % — ABNORMAL LOW (ref 36.0–46.0)
Hemoglobin: 7.5 g/dL — ABNORMAL LOW (ref 12.0–15.0)
Immature Granulocytes: 2 %
Lymphocytes Relative: 16 %
Lymphs Abs: 1.9 10*3/uL (ref 0.7–4.0)
MCH: 24.4 pg — ABNORMAL LOW (ref 26.0–34.0)
MCHC: 32.5 g/dL (ref 30.0–36.0)
MCV: 75.2 fL — ABNORMAL LOW (ref 80.0–100.0)
Monocytes Absolute: 1.1 10*3/uL — ABNORMAL HIGH (ref 0.1–1.0)
Monocytes Relative: 9 %
Neutro Abs: 8.7 10*3/uL — ABNORMAL HIGH (ref 1.7–7.7)
Neutrophils Relative %: 71 %
Platelets: 282 10*3/uL (ref 150–400)
RBC: 3.07 MIL/uL — ABNORMAL LOW (ref 3.87–5.11)
RDW: 20.1 % — ABNORMAL HIGH (ref 11.5–15.5)
WBC: 12.2 10*3/uL — ABNORMAL HIGH (ref 4.0–10.5)
nRBC: 0.7 % — ABNORMAL HIGH (ref 0.0–0.2)

## 2019-12-10 LAB — GLUCOSE, CAPILLARY
Glucose-Capillary: 146 mg/dL — ABNORMAL HIGH (ref 70–99)
Glucose-Capillary: 182 mg/dL — ABNORMAL HIGH (ref 70–99)
Glucose-Capillary: 149 mg/dL — ABNORMAL HIGH (ref 70–99)

## 2019-12-10 LAB — MAGNESIUM: Magnesium: 1.8 mg/dL (ref 1.7–2.4)

## 2019-12-10 LAB — PHOSPHORUS: Phosphorus: 3.6 mg/dL (ref 2.5–4.6)

## 2019-12-10 MED ORDER — TRACE MINERALS CU-MN-SE-ZN 300-55-60-3000 MCG/ML IV SOLN
INTRAVENOUS | Status: AC
  Filled 2019-12-10: qty 936

## 2019-12-10 NOTE — Consult Note (Signed)
PHARMACY - TOTAL PARENTERAL NUTRITION CONSULT NOTE   Indication: Prolonged ileus  Patient Measurements: Height: 5\' 9"  (175.3 cm) Weight: (!) 220.4 kg (486 lb) IBW/kg (Calculated) : 66.2 TPN AdjBW (KG): 90.5 Body mass index is 71.77 kg/m.  Assessment: 34 year old female with PMHx of HTN admitted with concern for perforated diverticulitis with contained abscesses. Patient is POD # 22 from laparoscopic lysis of adhesions and drainage of intra-abdominal abscess.   Glucose / Insulin: A1C 5.7, BG range 147 - 167: 15 units resistant SSI required.  Electrolytes: Cl:Ac ratio improved and stable, sodium trending lower,   magnesium at lower end of range Renal: SCr < 1, stable Renal function remains normal, SCr < 1, stable, UO - 2.1L LFTs / TGs: LFTs WNL at baseline, ALT trending up, TG 69-->193->245 Prealbumin / albumin: <8.6 / 1.4 Intake / Output; MIVF: Continue to monitor I&Os  GI Imaging: 9/7 CT abdomen pelvis: colonic perforation related to diverticulitis with worsening fluid and gas containing abscesses is throughout the low abdomen and pelvis many with interloop and mesenteric involvement, also with multiple areas of pelvic fluid as well showing generalized inflammation tracking to the mid abdomen. Interval decompression of the abscess in the cul-de-sac following drain placement. Extensive body wall edema.  9/15 CT concerning for perihepatic fluid collection and possible abscess 9/28 CT-guided percutaneous drainage of left lower quadrant abscess containing mostly gas and also yielding some bloody fluid  Surgeries / Procedures:  9/8 Robotic assisted laparoscopic lysis of adhesions and drainage of intra-abdominal abscess  9/17 IR placement of drain in perihepatic fluid collection  9/27 IR drain of LLQ abscess  Central access: 11/19/19 TPN start date: 11/19/19  Nutritional Goals  kCal: 2700 - 3000 kCal/day, Protein: >135 g/day, Fluid: > 2000 mL Goal TPN rate is 90 mL/hr (provides 140 g  of protein and 2999 kcals per day)  Current Nutrition:  Advanced to full liquids on 9/22 then advanced to soft diet on 9/23 then back to clear liquids on 9/26  Plan:   Continue TPN at 90 mL/hr using concentrated amino acids  Nutritional components: 21% dextrose, 65 g/L amino acids, 41.5 g/L lipids, 2999 kCal  Electrolytes in TPN: 161mEq/L of Na, 15 mEq/L of K, 86mEq/L of Ca, 5 mEq/L of Mg, phosphorous 15 mmol/L, continue Cl:Ac ratio at max (currently 1.7 : 1)  Sodium increased to 100 mEq/L 9/28  Magnesium increased to 5 mEq/L 9/29  Add standard MVI and trace elements to TPN  Continue resistant SSI to q6h SSI and continue to adjust as needed - 20 units insulin regular in TPN   Monitor TPN labs daily until stable then on Mon/Thurs, next in am 9/29   10/29 12/10/2019 7:02 AM

## 2019-12-10 NOTE — Progress Notes (Signed)
Mobility Specialist - Progress Note   12/10/19 1713  Mobility  Activity  (bed-level exercises)  Level of Assistance Minimal assist, patient does 75% or more  Mobility Response Tolerated well  Mobility performed by Mobility specialist  $Mobility charge 1 Mobility    Pre-mobility: 116 HR, 148/93 BP, 97% SpO2 Post-mobility: 117 HR, 132/95 BP, 96% SpO2   Pt laying in bed w/ mom present in room upon arrival. Pt agreed to session. Pt performed UE bed-level exercises: straight arm raise x5, and cross body reaches x10. Pt performed LE bed-level exercises: slr x 10/leg, hip ad/ad x 10/leg, quad sets x 10/leg, glute sets x10. Pt needed min. Assist for these exercises to help w/ increasing ROM. Pt encouraged to perform these exercises in between sessions w/ the help of her mom. Pt states understanding. Overall, pt tolerated session very well. Pt remained in bed w/ mom present in room. All needs placed in reach. Nurse was notified.    Carmen Vallecillo Mobility Specialist  12/10/19, 5:20 PM

## 2019-12-10 NOTE — Care Plan (Signed)
Pts mother called out to secretary stating that patient needs pain medication. Pt in bed with eyes closed not grunting or making noise. States she hurts but would not give me a number. Pt couldn't keep eyes open during pain assessment. Pt and mother educated that patient was given oxycodone 10mg  at 1705 and could not have IV pain medication this close to prior administration and the patient not being able to open her eyes to answer questions. Pt given muscle relaxer since she stated that she is tense.   Mother and patient educated about narcotic pain medication and patient was hurting like this because she will not move for staff and does minimal mobility. Mother was educated about patients sedation and inability to follow commands for IS. Pt could not perform IS at this time due to sedation and would not follow direction from this writer. Robaxin 500mg  given at this time since pain medication was just administered within the last hour and 10 minutes.

## 2019-12-10 NOTE — Treatment Plan (Signed)
Patient RED MEWS complete at this time. Pt is back to yellow and MD is aware that patient does this.

## 2019-12-10 NOTE — Progress Notes (Addendum)
[facility] SURGICAL PROGRESS NOTE  Hospital Day(s): 31.   Post op day(s): 22 Days Post-Op.   Interval History:  Patient seen and examined no acute events or new complaints overnight. Patient reports she has sporadic and random twinges of pain in her upper and lower abdomen, she appears comfortably at time of my examination No fever, chills, nausea, emesis Leukocytosis continues to gradually improve, down to 12.2K Hgb stable low, 7.5 Renal function normal, sCr - 0.55, good UO - 1.7L No significant electrolyte derangements Continues to have some degree of malnutrition, hypoalbuminemia - 1.5   Drain output as follows:  - RLQ:25ccs,this appears more purulent - LLQ: 5 ccs,appears more purulent this morning Continues onMeropenem (Day 21) Back on CLD; tolerating Having bowel function Therapies on board; recommending SNF; this does appear to be major limiting factor in her progress, as therapy is frequently cancelling due to her tachycardia (which has been present for her entire hospital stay).   Vital signs in last 24 hours: [min-max] current  Temp:  [98.4 F (36.9 C)-99.6 F (37.6 C)] 99 F (37.2 C) (09/30 0730) Pulse Rate:  [109-119] 112 (09/30 0730) Resp:  [20-32] 24 (09/30 0348) BP: (109-148)/(77-103) 148/90 (09/30 0730) SpO2:  [94 %-100 %] 94 % (09/30 0730)     Height: 5\' 9"  (175.3 cm) Weight: (!) 220.4 kg BMI (Calculated): 71.74   Intake/Output last 2 shifts:  09/29 0701 - 09/30 0700 In: 3106.6 [P.O.:720; I.V.:1866.6; IV Piggyback:500] Out: 1780 [Urine:1700; Drains:30; Stool:50]   Physical Exam:  Constitutional: alert, cooperative and no distress Respiratory:Breathing appears to be improving, less tachypneic, still with some intermittent conversational dyspnea but again this has been her baseline here in the hospital Cardiovascular:Tachycardicand sinus rhythm  Gastrointestinal:Obese, soft,she appears to be tender in  right mid-abdomen but this is difficult to pinpoint given body habitus, non-distended, JP in RLQappears purulent. Rectal tube in place. More recently placed LLQ drain, output appears more seropurulent this morning Genitourinary: Foley in place Musculoskeletal: 1+ edema to bilateral LE Integumentary:Laparoscopic incisions are CDI with dermabond, no erythema or drainage  Labs:  CBC Latest Ref Rng & Units 12/10/2019 12/09/2019 12/08/2019  WBC 4.0 - 10.5 K/uL 12.2(H) 12.7(H) 13.3(H)  Hemoglobin 12.0 - 15.0 g/dL 7.5(L) 7.4(L) 7.3(L)  Hematocrit 36 - 46 % 23.1(L) 23.2(L) 23.5(L)  Platelets 150 - 400 K/uL 282 298 290   CMP Latest Ref Rng & Units 12/10/2019 12/09/2019 12/08/2019  Glucose 70 - 99 mg/dL 12/10/2019) 267(T) 245(Y)  BUN 6 - 20 mg/dL 19 18 19   Creatinine 0.44 - 1.00 mg/dL 099(I 3.38  Sodium 135 - 145 mmol/L 134(L) 134(L) 133(L)  Potassium 3.5 - 5.1 mmol/L 3.9 3.7 3.8  Chloride 98 - 111 mmol/L 99 100 98  CO2 22 - 32 mmol/L 27 28 28   Calcium 8.9 - 10.3 mg/dL 7.7(L) 7.8(L) 7.8(L)  Total Protein 6.5 - 8.1 g/dL 8.1 - -  Total Bilirubin 0.3 - 1.2 mg/dL 0.4 - -  Alkaline Phos 38 - 126 U/L 193(H) - -  AST 15 - 41 U/L 44(H) - -  ALT 0 - 44 U/L 103(H) - -     Imaging studies: No new pertinent imaging studies   Assessment/Plan:  34 y.o. female with gradual improvement in leukocytosis 22 Days Post-Op s/p robotic assisted laparoscopic lysis of adhesion and drainage of intra-abdominal abscess WITHOUT evidence of perforation or frank stoolforperforated diverticulitis, complicated by hypoxic respiratory failure requiring post-operative intubation(since extubated).   - Continue CLDfor now - Continue TPN, NPO, Nutritional supplements -  Continue IV ABx; switched to Meropenem (Day 21)   - Continue RLQ/LLQ drains   - No plans for surgical re-intervention at this time - Monitor abdominal examination; on-going bowel function - Pain control prn;  antiemetics prn -Monitor H&H; s/p transfusion 1 unit pRBC on 09/21 - Monitor leukocytosis, renal function - Once patient can better transfer/ambulate to commode/bathroom then we can consider removing rectal tube/foley - Home medications; continue; appreciate hospitalist consultation -Mobilization encouraged as tolerated; PT/OT following; recommending SNF - DVT prophylaxis  All of the above findings and recommendations were discussed with the patient,and the medical team, and all of patient's questions were answered toher expressed satisfaction.    -- Lynden Oxford, PA-C [facility] 12/10/2019, 8:26 AM [phone] M-F: 7am - 4pm  I saw and evaluated the patient.  I agree with the above documentation, exam, and plan, which I have edited where appropriate. Duanne Guess  11:49 AM

## 2019-12-10 NOTE — Progress Notes (Signed)
ID Date of Admission:11/09/2019  11/18/19 laparotomy Antibiotic Meropenem 9/9> anidulaungin 9/9 >9/17 Fluconazole 11/27/19>>12/07/19  Culture8/30 BC -NG 9/3 intraabdominal abscess culture e.coli,   Streptococcus group C 11/27/19 -RT Upper quadrant abscess culture- lactobacillus and candida albicans 9/29 JP drain fluid- efeacium  Foley -11/18/19 PICC  Rt arm 11/19/19 Rt upper quadrant drain -11/27/19 Rt LQ drain 11/18/19 Left LQ drain 12/07/19 Rectal tube    Nurses just cleaned her She has no specific complaints  Patient Vitals for the past 24 hrs:  BP Temp Temp src Pulse Resp SpO2  12/10/19 1130 122/73 98.7 F (37.1 C) Oral (!) 112 (!) 24 96 %  12/10/19 0730 (!) 148/90 99 F (37.2 C) Oral (!) 112 -- 94 %  12/10/19 0348 136/83 99.1 F (37.3 C) Oral (!) 112 (!) 24 95 %  12/09/19 2333 109/77 98.8 F (37.1 C) Oral (!) 119 (!) 24 100 %  12/09/19 2220 117/88 98.7 F (37.1 C) Oral (!) 117 (!) 24 --  12/09/19 2120 130/88 99.1 F (37.3 C) Oral (!) 118 (!) 24 100 %  12/09/19 2024 (!) 142/103 98.4 F (36.9 C) Oral (!) 118 20 94 %  12/09/19 1920 (!) 133/93 98.6 F (37 C) Oral (!) 118 (!) 28 100 %  12/09/19 1820 (!) 139/95 99.6 F (37.6 C) Oral (!) 118 (!) 24 96 %  12/09/19 1627 (!) 133/93 98.6 F (37 C) Oral (!) 119 (!) 32 96 %   O/E grunting respiration Pale Some distress Chest b/l air entry- decreased bases HS-Tachycardia abd soft  3 Jp drain  CBC Latest Ref Rng & Units 12/10/2019 12/09/2019 12/08/2019  WBC 4.0 - 10.5 K/uL 12.2(H) 12.7(H) 13.3(H)  Hemoglobin 12.0 - 15.0 g/dL 7.5(L) 7.4(L) 7.3(L)  Hematocrit 36 - 46 % 23.1(L) 23.2(L) 23.5(L)  Platelets 150 - 400 K/uL 282 298 290    CMP Latest Ref Rng & Units 12/10/2019 12/09/2019 12/08/2019  Glucose 70 - 99 mg/dL 409(W) 119(J) 478(G)  BUN 6 - 20 mg/dL 19 18 19   Creatinine 0.44 - 1.00 mg/dL 9.56 2.13  Sodium 135 - 145 mmol/L 134(L) 134(L) 133(L)  Potassium 3.5 - 5.1 mmol/L 3.9 3.7 3.8  Chloride 98 - 111 mmol/L 99  100 98  CO2 22 - 32 mmol/L 27 28 28   Calcium 8.9 - 10.3 mg/dL 7.7(L) 7.8(L) 7.8(L)  Total Protein 6.5 - 8.1 g/dL 8.1 - -  Total Bilirubin 0.3 - 1.2 mg/dL 0.4 - -  Alkaline Phos 38 - 126 U/L 193(H) - -  AST 15 - 41 U/L 44(H) - -  ALT 0 - 44 U/L 103(H) - -   Impression/recommedation Perforated diverticulitis with extensive intra-abdominal loop abscesses- admitted 11/09/19 Status post laparoscopy and washout.on 11/18/19 CT abdomen done9/16shows an abscess along rtlateral margin liverand organizing abscesses central abdominal mesentry - . IRplaced another drain9/17.Culturelactobacillus and rare candida albicans -on meropenem   CT abdomenwas done a new drain placed on 12/07/19  Chronic foley since 9/8- risk for CAUTI, no neurogenic bladder or obstruction discussed with patient regarding foley removal and trying purewick- They both are  Interested. Will discuss with primary team regarding that  Anemia- Hb 7.3 Would benefit from blood transfusion as she is unable to participate in PT due to tachycardia and transfusion may help- Also for her weight of > 400 pounds this hb is low  Morbid obesity   She needs to participate in PT, do incentive spirometry, sit up in bed/chair Discussed with patient and mother and her nurse

## 2019-12-10 NOTE — Treatment Plan (Signed)
Harriett Sine called in tele and made aware of order d/c

## 2019-12-10 NOTE — Treatment Plan (Signed)
Dressings changed to abdomen at this time.   Upper left incision is not healed, unattached edges, red with granulation tissue present. Area cleansed with secura and gauze and tape applied.   Lower left drain dressing changed at this time. Area is clean, dry and intact, pink in color.  Gauze and tegaderm applied at this time.   Upper right incision is unattached edges, size of pinhole, pink in color. Gauze applied and tape.   Lower right JP dressing changed at this time. JP drain stripped as well. Area is clean, dry, pink and sutures attached to skin. Gauze and tegaderm applied at this time.

## 2019-12-10 NOTE — Progress Notes (Signed)
PROGRESS NOTE    Amanda Reyes  WNU:[id number] DOB: 1985-09-14 DOA: 11/09/2019 PCP: Patient, No Pcp Per   Brief Narrative: Amanda Reyes is a 34 y.o. female status post operative drainage of intra-abdominal abscesses.  No evidence of perforation or frank stool.  Postoperative course complicated by hypoxic respiratory failure requiring intubation.  Now extubated. Course continues to be complicated by recurrent fever requiring repeat drain placement and continued IV antibiotics. Currently on TPN. Significant physical debility with likely SNF discharge.   Assessment & Plan:   Active Problems:   Diverticulitis of colon with perforation   Diverticulitis of large intestine with perforation and abscess   Intra-abdominal abscesses Patient is status post laparoscopic lysis of adhesions and drainage of abscesses.  Patient also had drain placement on 9/8 upsized on 9/17 in addition to the drain placed on 9/27 for chronic fevers and LLQ abscess.  Patient was previously on ciprofloxacin and flagyl, transitioned to  meropenem and anidulafungin -> fluconazole for treatment of infection/abscess continued E. coli with evidence of few lactobacillus species and rare Candida albicans.  Fluconazole was eventually.  Discontinued. Drain culture (9/28) shows few enterococcus faecium -ID recommendations: Meropenem -Discussed with pharmacy with regard to enterococcus faecium who will alert ID; may need to expand antibiotic treatment to include Vancomycin. Patient is still spiking high grade temps without overt fever  Recurrent fever Secondary to above.  Currently is afebrile but with recurrent high grade temperatures  Acute respiratory failure with hypoxia and hypercapnia Patient required intubation and mechanical ventilation post surgery. Patient is currently on 2 L via nasal cannula. -Wean to room air as able  Essential hypertension Better controlled on hydrochlorothiazide. Patient is on  losartan-hydrochlorothiazide as an outpatient. -Continue HCTZ 25 mg daily  Tachycardia Patient has had persistent tachycardia this admission.  In setting of infection.  Patient was also amlodipine which may have been contributing but likely very minimally.  Currently not hemodynamically unstable.  No need for tight control at this time.  Malnutrition Morbid obesity Currently on TPN secondary to above. Also with evidence of hypoalbuminemia last albumin of 1.4.  Dietitian is consulted.  Patient is currently advanced to a regular diet. -General surgery recommendations for TPN/diet  Transaminitis Possibly secondary to TPN.  Currently stable.  History of cholecystectomy.  Last CT scan without evidence of biliary obstruction.  Previous fluid collection almost completely resolved on last CT (9/25). AST/ALT continue to climb. -Daily CMP  AKI Resolved.  Pre-diabetes Patient does not appear to have a diagnosis of diabetes.  Last hemoglobin A1c of 5.7%.  Complicated by significant obesity.  Patient will benefit from Metformin use on discharge as she likely has a significant amount of insulin resistance.  Patient is currently managed on sliding scale insulin while inpatient.   DVT prophylaxis: Per primary (Lovenox) Code Status:   Code Status: Full Code Family Communication: Mother at bedside Disposition Plan: Per primary, General Surgery   Antimicrobials:  Meropenem  Anidulafungin  Fluconazole    Subjective: Worsened abdominal pain. She states she is trying to have a bowel movement. She reports passing gas. No nausea/vomiting.   Objective: Vitals:   12/09/19 2333 12/10/19 0348 12/10/19 0730 12/10/19 1130  BP: 109/77 136/83 (!) 148/90 122/73  Pulse: (!) 119 (!) 112 (!) 112 (!) 112  Resp: (!) 24 (!) 24  (!) 24  Temp: 98.8 F (37.1 C) 99.1 F (37.3 C) 99 F (37.2 C) 98.7 F (37.1 C)  TempSrc: Oral Oral Oral Oral  SpO2: 100% 95%  94% 96%  Weight:      Height:         Intake/Output Summary (Last 24 hours) at 12/10/2019 1309 Last data filed at 12/10/2019 1200 Gross per 24 hour  Intake 2986.56 ml  Output 1785 ml  Net 1201.56 ml   Filed Weights   12/03/19 0423 12/05/19 0438 12/07/19 0454  Weight: (!) 198 kg (!) 224.5 kg (!) 220.4 kg    Examination:  General exam: Appears calm and comfortable Respiratory system: Clear to auscultation. Respiratory effort normal. Cardiovascular system: S1 & S2 heard, Tachycardia with normal rate. No murmurs, rubs, gallops or clicks. Gastrointestinal system: Abdomen is obese, soft and tender generally. No organomegaly or masses felt. Decreased bowel sounds heard. Central nervous system: Alert and oriented. No focal neurological deficits. Musculoskeletal: LE edema. No calf tenderness Skin: No cyanosis. No rashes Psychiatry: Judgement and insight appear normal. Mood & affect appropriate.     Data Reviewed: I have personally reviewed following labs and imaging studies  CBC Lab Results  Component Value Date   WBC 12.2 (H) 12/10/2019   RBC 3.07 (L) 12/10/2019   HGB 7.5 (L) 12/10/2019   HCT 23.1 (L) 12/10/2019   MCV 75.2 (L) 12/10/2019   MCH 24.4 (L) 12/10/2019   PLT 282 12/10/2019   MCHC 32.5 12/10/2019   RDW 20.1 (H) 12/10/2019   LYMPHSABS 1.9 12/10/2019   MONOABS 1.1 (H) 12/10/2019   EOSABS 0.2 12/10/2019   BASOSABS 0.1 12/10/2019     Last metabolic panel Lab Results  Component Value Date   NA 134 (L) 12/10/2019   K 3.9 12/10/2019   CL 99 12/10/2019   CO2 27 12/10/2019   BUN 19 12/10/2019   CREATININE 0.55 12/10/2019   GLUCOSE 147 (H) 12/10/2019   GFRNONAA >60 12/10/2019   GFRAA >60 12/10/2019   CALCIUM 7.7 (L) 12/10/2019   PHOS 3.6 12/10/2019   PROT 8.1 12/10/2019   ALBUMIN 1.5 (L) 12/10/2019   BILITOT 0.4 12/10/2019   ALKPHOS 193 (H) 12/10/2019   AST 44 (H) 12/10/2019   ALT 103 (H) 12/10/2019   ANIONGAP 8 12/10/2019    CBG (last 3)  Recent Labs    12/09/19 2345 12/10/19 0553  12/10/19 1203  GLUCAP 167* 146* 182*     GFR: Estimated Creatinine Clearance: 202 mL/min (by C-G formula based on SCr of 0.55 mg/dL).  Coagulation Profile: No results for input(s): INR, PROTIME in the last 168 hours.  Recent Results (from the past 240 hour(s))  Body fluid culture     Status: None (Preliminary result)   Collection Time: 12/08/19  9:56 PM   Specimen: JP Drain; Body Fluid  Result Value Ref Range Status   Specimen Description   Final    JP DRAINAGE Performed at [facility], [address] [place], [place] [address]    Special Requests   Final    NONE Performed at [facility], [address] [place], [place] [address]    Gram Stain   Final    ABUNDANT WBC PRESENT, PREDOMINANTLY PMN RARE GRAM POSITIVE COCCI IN PAIRS    Culture   Final    FEW GRAM NEGATIVE RODS FEW ENTEROCOCCUS FAECIUM CULTURE REINCUBATED FOR BETTER GROWTH Performed at [facility], [address], [place], [place] [address]    Report Status PENDING  Incomplete        Radiology Studies: No results found.      Scheduled Meds: . sodium chloride   Intravenous Once  .  amLODipine  10 mg Oral Daily  . Chlorhexidine Gluconate Cloth  6 each Topical Q0600  . enoxaparin (LOVENOX) injection  40 mg Subcutaneous Q12H  . feeding supplement (ENSURE ENLIVE)  237 mL Oral TID BM  . hydrochlorothiazide  25 mg Oral Daily  . insulin aspart  0-20 Units Subcutaneous Q6H  . mouth rinse  15 mL Mouth Rinse q12n4p  . pantoprazole (PROTONIX) IV  40 mg Intravenous Q12H  . sodium chloride flush  10-40 mL Intracatheter Q12H  . sodium chloride flush  5 mL Intracatheter Q8H  . sodium chloride flush  5 mL Intracatheter Q8H   Continuous Infusions: . sodium chloride    . sodium chloride Stopped (12/08/19 1457)  . meropenem (MERREM) IV 1 g (12/10/19 0730)  . TPN ADULT (ION) 90 mL/hr at 12/09/19 1749  . TPN ADULT (ION)       LOS: 31 days     Jacquelin Hawking, MD [facility] 12/10/2019, 1:09 PM  If 7PM-7AM, please contact night-coverage [email or web address]

## 2019-12-10 NOTE — Progress Notes (Addendum)
Pharmacy Antibiotic Note  Amanda Reyes is a 34 y.o. female admitted on 11/09/2019 with diverticulitis of colon with perforation. Patient day 14 s/p laparoscopic lysis of adhesions and drainage of intra-abdominal abscess with further placement of 3 drains. Pharmacy has been consulted for meropenem dosing. ID is following.   Today, 9/30:  Day #22 Meropenem day #32 abx  Leukocytosis remains present (WBC 12.7 on 9/29) but is decreasing  Afebrile 3 drains in place, IR likely to remove to RUQ drain soon Right Lower Quadrant: 30 ccs, more purluent  Left lower quadrant: appears more purluent Right upper quadrant: 10 ccs, serous    Cultures:  JP Drain 9/28: Enterococcus faecium, susceptibilities to follow Abscess 9/17: Lactobacillus and Candida Albicans Abscess 9/3: E. Coli & Streptococcus Group C, pan-sensitive  BCx 8/30: NG, final UCx 8/30: NG, final   Scr: 0.52 (9/29), good UOP     Plan: Continue meropenem 1g every 8 hours.Continue to monitor renal function daily throughout therapy.   Height: 5\' 9"  (175.3 cm) Weight: (!) 220.4 kg (486 lb) IBW/kg (Calculated) : 66.2  Temp (24hrs), Avg:98.9 F (37.2 C), Min:98.4 F (36.9 C), Max:99.6 F (37.6 C)  Recent Labs  Lab 12/05/19 0444 12/06/19 0518 12/06/19 0719 12/07/19 0501 12/08/19 0611 12/09/19 0534 12/09/19 0537 12/10/19 0440  WBC   < >  --  18.0* 15.3* 13.3* 12.7*  --  12.2*  CREATININE  --  0.46  --  0.53 0.54  --  0.58 0.55   < > = values in this interval not displayed.    Estimated Creatinine Clearance: 202 mL/min (by C-G formula based on SCr of 0.55 mg/dL).    Allergies  Allergen Reactions  . Penicillins Hives    Did it involve swelling of the face/tongue/throat, SOB, or low BP? No Did it involve sudden or severe rash/hives, skin peeling, or any reaction on the inside of your mouth or nose? Yes Did you need to seek medical attention at a hospital or doctor's office? No When did it last happen? If all  above answers are "NO", may proceed with cephalosporin use..3  . Tomato Rash    Antimicrobials this admission: Meropenem 9/9 >> Fluconazole 9/18>>9/27 Anidulagungin 9/9 >>9/17 Metronidazole 8/30>>9/8  Ciprofloxacin 8/30>>9/8     Thank you for allowing pharmacy to be a part of this patient's care.  9/30 12/10/2019 2:21 PM

## 2019-12-10 NOTE — Plan of Care (Signed)
Educated patient at this time and mother the use of incentive spirometer and how it helps with pneumonia in the hospital and blood clots. Educated pt for 30 minutes and demonstrated how to use IS. Pt fair effort of but patient states it is hard. Will continue to re inforce use.

## 2019-12-11 DIAGNOSIS — R7303 Prediabetes: Secondary | ICD-10-CM | POA: Diagnosis not present

## 2019-12-11 DIAGNOSIS — E46 Unspecified protein-calorie malnutrition: Secondary | ICD-10-CM | POA: Diagnosis not present

## 2019-12-11 DIAGNOSIS — K5792 Diverticulitis of intestine, part unspecified, without perforation or abscess without bleeding: Secondary | ICD-10-CM | POA: Diagnosis not present

## 2019-12-11 DIAGNOSIS — K5720 Diverticulitis of large intestine with perforation and abscess without bleeding: Secondary | ICD-10-CM | POA: Diagnosis not present

## 2019-12-11 DIAGNOSIS — K651 Peritoneal abscess: Secondary | ICD-10-CM | POA: Diagnosis not present

## 2019-12-11 DIAGNOSIS — D649 Anemia, unspecified: Secondary | ICD-10-CM | POA: Diagnosis not present

## 2019-12-11 LAB — BASIC METABOLIC PANEL
Anion gap: 8 (ref 5–15)
BUN: 22 mg/dL — ABNORMAL HIGH (ref 6–20)
CO2: 27 mmol/L (ref 22–32)
Calcium: 8.1 mg/dL — ABNORMAL LOW (ref 8.9–10.3)
Chloride: 98 mmol/L (ref 98–111)
Creatinine, Ser: 0.65 mg/dL (ref 0.44–1.00)
GFR calc Af Amer: >60 mL/min (ref >60)
GFR calc non Af Amer: >60 mL/min (ref >60)
Glucose, Bld: 203 mg/dL — ABNORMAL HIGH (ref 70–99)
Potassium: 4.1 mmol/L (ref 3.5–5.1)
Sodium: 133 mmol/L — ABNORMAL LOW (ref 135–145)

## 2019-12-11 LAB — URINALYSIS, ROUTINE W REFLEX MICROSCOPIC
Bilirubin Urine: NEGATIVE
Glucose, UA: NEGATIVE mg/dL
Hgb urine dipstick: NEGATIVE
Ketones, ur: NEGATIVE mg/dL
Leukocytes,Ua: NEGATIVE
Nitrite: NEGATIVE
Protein, ur: NEGATIVE mg/dL
Specific Gravity, Urine: 1.017 (ref 1.005–1.030)
pH: 7 (ref 5.0–8.0)

## 2019-12-11 LAB — MAGNESIUM: Magnesium: 1.8 mg/dL (ref 1.7–2.4)

## 2019-12-11 LAB — CBC
HCT: 25.6 % — ABNORMAL LOW (ref 36.0–46.0)
Hemoglobin: 8.1 g/dL — ABNORMAL LOW (ref 12.0–15.0)
MCH: 24.3 pg — ABNORMAL LOW (ref 26.0–34.0)
MCHC: 31.6 g/dL (ref 30.0–36.0)
MCV: 76.6 fL — ABNORMAL LOW (ref 80.0–100.0)
Platelets: 325 10*3/uL (ref 150–400)
RBC: 3.34 MIL/uL — ABNORMAL LOW (ref 3.87–5.11)
RDW: 20 % — ABNORMAL HIGH (ref 11.5–15.5)
WBC: 13.1 10*3/uL — ABNORMAL HIGH (ref 4.0–10.5)
nRBC: 0.5 % — ABNORMAL HIGH (ref 0.0–0.2)

## 2019-12-11 LAB — GLUCOSE, CAPILLARY
Glucose-Capillary: 139 mg/dL — ABNORMAL HIGH (ref 70–99)
Glucose-Capillary: 164 mg/dL — ABNORMAL HIGH (ref 70–99)
Glucose-Capillary: 167 mg/dL — ABNORMAL HIGH (ref 70–99)
Glucose-Capillary: 176 mg/dL — ABNORMAL HIGH (ref 70–99)
Glucose-Capillary: 181 mg/dL — ABNORMAL HIGH (ref 70–99)

## 2019-12-11 LAB — PROCALCITONIN: Procalcitonin: 0.3 ng/mL

## 2019-12-11 MED ORDER — TRACE MINERALS CU-MN-SE-ZN 300-55-60-3000 MCG/ML IV SOLN
INTRAVENOUS | Status: AC
  Filled 2019-12-11: qty 468

## 2019-12-11 MED ORDER — LINEZOLID 600 MG/300ML IV SOLN
600.0000 mg | Freq: Two times a day (BID) | INTRAVENOUS | Status: AC
  Administered 2019-12-12 – 2019-12-18 (×15): 600 mg via INTRAVENOUS
  Filled 2019-12-11 (×16): qty 300

## 2019-12-11 NOTE — Progress Notes (Signed)
ID Date of Admission:11/09/2019  11/18/19 laparotomy Antibiotic Meropenem 9/9> anidulaungin 9/9 >9/17 Fluconazole 11/27/19>>12/07/19  Culture8/30 BC -NG 9/3 intraabdominal abscess culture e.coli,  Streptococcus group C 11/27/19 -RT Upper quadrant abscess culture- lactobacillus and candida albicans 9/29 JP drain fluid- efeacium, e.coli  Foley -11/18/19>> PICC Rt arm 11/19/19>> Rt upper quadrant drain -11/27/19>> Rt LQ drain 11/18/19>> Left LQ drain 12/07/19>> Rectal tube   Pt says she was having abdominal pain this morning and after the foley was flushed she is feeling better TMAx over the past 7 days is 100  Patient Vitals for the past 24 hrs:  BP Temp Temp src Pulse Resp SpO2  12/11/19 1157 (!) 140/93 98 F (36.7 C) Oral (!) 119 (!) 24 99 %  12/11/19 0800 (!) 156/108 98.2 F (36.8 C) Oral (!) 125 (!) 22 99 %  12/11/19 0328 (!) 149/91 98.2 F (36.8 C) Oral (!) 124 (!) 22 99 %  12/11/19 0005 134/85 99 F (37.2 C) Oral (!) 114 (!) 24 96 %  12/10/19 2021 (!) 146/100 99.3 F (37.4 C) Oral (!) 117 (!) 24 95 %    In bed Pale Grunting Ill Chest b/l air entry abd- edema of abdominal wall- rt UQ drain, RT LQ drain and left lower quadrant drain Anasarca Foley Rectal tube  Labs CBC Latest Ref Rng & Units 12/11/2019 12/10/2019 12/09/2019  WBC 4.0 - 10.5 K/uL 13.1(H) 12.2(H) 12.7(H)  Hemoglobin 12.0 - 15.0 g/dL 8.1(L) 7.5(L) 7.4(L)  Hematocrit 36 - 46 % 25.6(L) 23.1(L) 23.2(L)  Platelets 150 - 400 K/uL 325 282 298    CMP Latest Ref Rng & Units 12/11/2019 12/10/2019 12/09/2019  Glucose 70 - 99 mg/dL 546(E) 703(J) 009(F)  BUN 6 - 20 mg/dL 81(W) 19 18  Creatinine 0.44 - 1.00 mg/dL 2.99 3.71 6.96  Sodium 135 - 145 mmol/L 133(L) 134(L) 134(L)  Potassium 3.5 - 5.1 mmol/L 4.1 3.9 3.7  Chloride 98 - 111 mmol/L 98 99 100  CO2 22 - 32 mmol/L 27 27 28   Calcium 8.9 - 10.3 mg/dL 8.1(L) 7.7(L) 7.8(L)  Total Protein 6.5 - 8.1 g/dL - 8.1 -  Total Bilirubin 0.3 - 1.2 mg/dL - 0.4 -   Alkaline Phos 38 - 126 U/L - 193(H) -  AST 15 - 41 U/L - 44(H) -  ALT 0 - 44 U/L - 103(H) -    Impression/recommendation Perforated diverticulitis with extensive intra-abdominal loop abscesses- admitted 11/09/19 Status post laparoscopy and washout.on 11/18/19 CT abdomen done9/16shows an abscess along rtlateral margin liverand organizing abscesses central abdominal mesentry - . IRplaced another drain9/17.Culturelactobacillus and rare candida albicans -on meropenem   CT abdomenwas donea new drain placed on 12/07/19 repeat culture enterococcus faecium and e.coli Called lab they still have not ID- will add linezolid It is not going to be possible to treat the stool organisms which are continuously leaking intrabdominally thru  Perforation/fistula. This is causing ongoing  low level cytokine response inspite of antibiotic.   she has developed MDRO . Need tighter source control   Chronic foley since 9/8- purewick will be tried if she is more mobile   Anemia-Hb 7.3 Discussed with hospitalist regarding need for PRBC as she has been persistently tachycardic especially with PT and also has been  dizzy. Pt would like to defer unless absolutely needed   Morbid obesity   She needs to participate in PT, do incentive spirometry, sit up in bed/chair  Discussed with surgical team PA and hospitalist Dr.Netty today. Also discussed with patient and her mother  ID will follow her remotely this weekend- call if needed

## 2019-12-11 NOTE — Progress Notes (Signed)
   12/11/19 0900  Assess: MEWS Score  Level of Consciousness Alert  Assess: MEWS Score  MEWS Temp 0  MEWS Systolic 0  MEWS Pulse 2  MEWS RR 1  MEWS LOC 0  MEWS Score 3  MEWS Score Color Yellow  Assess: if the MEWS score is Yellow or Red  Were vital signs taken at a resting state? Yes  Focused Assessment No change from prior assessment  Early Detection of Sepsis Score *See Row Information* Low  MEWS guidelines implemented *See Row Information* No, previously yellow, continue vital signs every 4 hours  Treat  Pain Scale 0-10  Pain Score 10  Pain Type Surgical pain  Pain Location Abdomen  Pain Orientation Right;Anterior  Pain Intervention(s) Medication (See eMAR)  Escalate  MEWS: Escalate Yellow: discuss with charge nurse/RN and consider discussing with provider and RRT  Notify: Provider  Provider Name/Title Raphael Gibney  Date Provider Notified 12/11/19  Time Provider Notified 0900  Notification Type Face-to-face  Notification Reason Other (Comment) ( pain control/foley issue/JP)  Response See new orders  Date of Provider Response 12/11/19  Time of Provider Response [id number]  Document  Patient Outcome Other (Comment) (pain better controled)

## 2019-12-11 NOTE — Progress Notes (Addendum)
Paged on call provider Dr. Maia Plan about pt's pain in peri area. Pt has been experiencing pressure and pain where the foley catheter is at. Pt states she feels pain every time the foley drains. Pt is requesting for the foley to be taken out. Foley catheter was placed on 11/18/2019. Pt's peri area is constantly moist and soiled due to pt being on her period. Peri care is needed multiple times during shift. Informed the provider that she may have a UTI. Urine is cloudy with sediment. Pt continues to yell and scream out from pain. Prn IV dilaudid, po oxy/tramadol, IV ativan given to help with no relief. On call informed writer to continue to monitor and primary surgeon will see pt in the am. Will continue to monitor.    0630- Notified on call about stool coming from JP drain on left side and pt needing the foley replaced. The urine is frothy, cloudy with sediment. Urine difficult to drain because of it. Pt continues to say she feel pressure where the foley is at. Pain meds doesn't seem to help with managing pain. Will continue to monitor.

## 2019-12-11 NOTE — Progress Notes (Signed)
°   12/10/19 1120  Clinical Encounter Type  Visited With Patient and family together  Visit Type Follow-up  Referral From Chaplain  Consult/Referral To Chaplain  While rounding the unit, chaplain stopped in to check on Pt. When chaplain entered the room, she saw Pt's mother sitting on the side and Pt appeared to be in pain. When asked how she was doing Pt said as always, fine. Mother said she is in pain. Chaplain asked if staff knew she was in pain and Pt said yes. Pt's mother said they just gave her something for pain, but not through the IV. Mother went on to say they want to transfer her, but the place they want to send her, won't take her because she is hooked up to various things. Chaplain said to Pt, give the medicine time and it will kick in and she shook her head. Chaplain began rubbing Pt's hand and arm and continued to do so for about 10 to 15 minutes. The more chaplain rubbed her arm, the more Pt relaxed and calmed down. Pt eventually went to sleep. Mother commented on how chaplain calmed Pt down. Selene, needed to be quiet and still. It appears chaplain brought about a calm presence which help her relax. Chaplain will check on Pt later.

## 2019-12-11 NOTE — Progress Notes (Signed)
Mobility Specialist - Progress Note   12/11/19 1522  Mobility  Activity  (bed exercises)  Level of Assistance Standby assist, set-up cues, supervision of patient - no hands on  Assistive Device Other (Comment) (bed rails)  Mobility Response Tolerated well  Mobility performed by Mobility specialist  $Mobility charge 1 Mobility    Pre-mobility: 115 HR, 126/79 BP, 96% SpO2    Pt laying in bed w/ mom present in room. Pt agreed to session. Pt performed UE bed-level exercises: straight arm raise x 10, crossover reach x 5, and toe touches x 10. Further mobility limited d/t pt feeling lethargic and "sleepy". Pt states she just took some pain medication. States they have made her feel tired, on top of PT session this AM. Will hold off rest of session. Pt encouraged to complete exercises later today in b/w mobility sessions. Pt and mom show understanding. Pt remained in bed w/ mom present in room. All needs placed in reach. Nurse was notified.    Yeny Schmoll Mobility Specialist  12/11/19, 3:25 PM

## 2019-12-11 NOTE — Progress Notes (Signed)
[facility] SURGICAL PROGRESS NOTE  Hospital Day(s): 32.   Post op day(s): 23 Days Post-Op.   Interval History:  Patient seen and examined She had issues overnight complaining about the foley, pain, cloudy, UA negative  Patient reports she has intermittent "pressure and discomfort" in her pelvis with attempting to urinate Abdominal discomfort at drain sites unchanged, she reports less distension this morning No nausea or emesis Leukocytosis stable, 13.1K Renal function remains normal, sCr - 0.65, UO - 1.4L No significant electrolyte derangements, mild hyponatremia to 133 Drain output as follows:  - RLQ:15ccs,this appears more purulent - LLQ: 120 ccs,appears more feculent Back down to FLD; tolerating Therapies on board; recommending SNF; this does appear to be major limiting factorin her progress, as therapy is frequently cancelling due to her tachycardia (which has been present for her entire hospital stay).  Vital signs in last 24 hours: [min-max] current  Temp:  [98.2 F (36.8 C)-99.3 F (37.4 C)] 98.2 F (36.8 C) (10/01 0328) Pulse Rate:  [112-124] 124 (10/01 0328) Resp:  [22-24] 22 (10/01 0328) BP: (122-149)/(73-100) 149/91 (10/01 0328) SpO2:  [94 %-99 %] 99 % (10/01 0328)     Height: 5\' 9"  (175.3 cm) Weight: (!) 220.4 kg BMI (Calculated): 71.74   Intake/Output last 2 shifts:  09/30 0701 - 10/01 0700 In: 3447.1 [P.O.:480; I.V.:2207.1; IV Piggyback:300] Out: 1535 [Urine:1400; Drains:135]   Physical Exam:  Constitutional: alert, cooperative and no distress Respiratory:Breathing appears to be improving, less tachypneic, still with some intermittent conversational dyspnea but again this has been her baseline herein the hospital Cardiovascular:Tachycardicand sinus rhythm  Gastrointestinal:Obese, soft,she appears to be tender in right mid-abdomen but this is difficult to pinpoint given body habitus, non-distended, JP in  RLQappearspurulent. Rectal tube in place. Newly placed LLQ drain, output appearsmore seropurulent this morning Genitourinary: Foley in place Musculoskeletal: 1+ edema to bilateral LE Integumentary:Laparoscopic incisions are CDI with dermabond, no erythema or drainage   Labs:  CBC Latest Ref Rng & Units 12/10/2019 12/09/2019 12/08/2019  WBC 4.0 - 10.5 K/uL 12.2(H) 12.7(H) 13.3(H)  Hemoglobin 12.0 - 15.0 g/dL 7.5(L) 7.4(L) 7.3(L)  Hematocrit 36 - 46 % 23.1(L) 23.2(L) 23.5(L)  Platelets 150 - 400 K/uL 282 298 290   CMP Latest Ref Rng & Units 12/11/2019 12/10/2019 12/09/2019  Glucose 70 - 99 mg/dL 12/11/2019) 169(C) 789(F)  BUN 6 - 20 mg/dL 810(F) 19 18  Creatinine 0.44 - 1.00 mg/dL 75(Z 0.25 8.52  Sodium 135 - 145 mmol/L 133(L) 134(L) 134(L)  Potassium 3.5 - 5.1 mmol/L 4.1 3.9 3.7  Chloride 98 - 111 mmol/L 98 99 100  CO2 22 - 32 mmol/L 27 27 28   Calcium 8.9 - 10.3 mg/dL 8.1(L) 7.7(L) 7.8(L)  Total Protein 6.5 - 8.1 g/dL - 8.1 -  Total Bilirubin 0.3 - 1.2 mg/dL - 0.4 -  Alkaline Phos 38 - 126 U/L - 193(H) -  AST 15 - 41 U/L - 44(H) -  ALT 0 - 44 U/L - 103(H) -     Imaging studies: No new pertinent imaging studies   Assessment/Plan:  34 y.o. female 57 Days Post-Op s/p robotic assisted laparoscopic lysis of adhesion and drainage of intra-abdominal abscess WITHOUT evidence of perforation or frank stoolforperforated diverticulitis, complicated by hypoxic respiratory failure requiring post-operative intubation(since extubated)   - I will trial on soft diet this morning    - Wean TPN to 1/2 rate today; monitor LFTs  - Continue nutritional supplementation  - Continue IV ABx; switched to Meropenem (Day 22); d/w ID  and if without fever and leukocytosis improving we will holiday from ABx             - Continue RLQ/LLQ drains; monitor and record output             - No plans for surgical re-intervention at this time - Monitor abdominal examination; on-going bowel  function - Pain control prn; antiemetics prn -Monitor H&H; s/p transfusion 1 unit pRBC on 09/21 - Monitor leukocytosis, renal function - Once patient can better ambulate to commode/bathroom then we can consider removing rectal tube/foley - Home medications; continue; appreciate hospitalist consultation -Mobilization encouraged as tolerated; PT/OT following; recommending SNF - DVT prophylaxis  All of the above findings and recommendations were discussed with the patient,and the medical team, and all of patient's questions were answered toher expressed satisfaction.   -- Lynden Oxford, PA-C [facility] 12/11/2019, 7:20 AM [phone] M-F: 7am - 4pm

## 2019-12-11 NOTE — Progress Notes (Signed)
Physical Therapy Treatment Patient Details Name: Amanda Reyes MRN: [id number] DOB: 10/01/1985 Today's Date: 12/11/2019    History of Present Illness Pt is 34 year old female with past medical history for morbid obesity and hypertension, admitted to ICU due to acute diverticulitis and microperforation.  Patient is s/p of robotic surgery for lysis of adhesions.    PT Comments    Pt was pleasant and motivated to participate during the session. Pt was able to perform all bed level therex with mild fatigue and was encouraged to perform proper breathing techniques throughout. Pt performed rolling L and R with Max A +3 for hygiene and linen changing. Pt was then able to come to sitting EOB with Max A +4. Pt noted some lightheadedness but it soon subsided. Pt was able to maintain balance with feet supported and bilateral UE support. Pt was able to perform partial STS x4 with Max A. Pt noted seom fatigue after. Pt then came from sitting to supine with Max A +3. SpO2 and HR were WFL throughout the session. Pt will benefit from PT services in a SNF setting upon discharge to safely address deficits listed in patient problem list for decreased caregiver assistance and eventual return to PLOF.    Follow Up Recommendations  SNF     Equipment Recommendations  Other (comment) (TBD at next level of care)    Recommendations for Other Services       Precautions / Restrictions Precautions Precautions: Fall Restrictions Weight Bearing Restrictions: No    Mobility  Bed Mobility Overal bed mobility: Needs Assistance Bed Mobility: Rolling Rolling: +3 for physical assistance;Max assist;+3 for safety/equipment Sidelying to sit: +4 for safety/equipment;+4 for physical assistance;Max assist;Total assist   Sit to supine: Max assist;Total assist;+3 for physical assistance;+3 for safety/equipment Sit to sidelying: Max assist;+3 for safety/equipment General bed mobility comments: pt required significant  effort to come to sitting  Transfers Overall transfer level: Needs assistance Equipment used: Rolling walker (2 wheeled) Transfers: Sit to/from Stand Sit to Stand: Max assist;+2 physical assistance;+2 safety/equipment         General transfer comment: pt able to perform x 4 with Max A and significant effort. Pt unable to push up too full trunk extension  Ambulation/Gait             General Gait Details: unsafe to attempt   Stairs             Wheelchair Mobility    Modified Rankin (Stroke Patients Only)       Balance Overall balance assessment: Needs assistance Sitting-balance support: Feet unsupported;Bilateral upper extremity supported Sitting balance-Leahy Scale: Fair Sitting balance - Comments: pt able to maintain balance sitting EOB with feet upported and bilateral UE support. Unable to maintain balnce outside BOS     Standing balance-Leahy Scale: Poor Standing balance comment: not able to come up to standing fully and maintain posistion                            Cognition Arousal/Alertness: Awake/alert Behavior During Therapy: Flat affect;WFL for tasks assessed/performed Overall Cognitive Status: Within Functional Limits for tasks assessed                                        Exercises Total Joint Exercises Ankle Circles/Pumps: AROM;Both;15 reps Quad Sets: AROM;10 reps;Both Other Exercises Other Exercises: pt performed supine crunches  x 10 Other Exercises: pt performed crossbody reach to L x10 and R x10    General Comments        Pertinent Vitals/Pain Pain Assessment: No/denies pain Pain Intervention(s): Limited activity within patient's tolerance;Monitored during session    Home Living                      Prior Function            PT Goals (current goals can now be found in the care plan section) Acute Rehab PT Goals Patient Stated Goal: Go home and be able to swim again PT Goal Formulation:  With patient Time For Goal Achievement: 12/08/19 Potential to Achieve Goals: Fair Progress towards PT goals: Progressing toward goals    Frequency    Min 2X/week      PT Plan Current plan remains appropriate    Co-evaluation              AM-PAC PT "6 Clicks" Mobility   Outcome Measure  Help needed turning from your back to your side while in a flat bed without using bedrails?: A Lot Help needed moving from lying on your back to sitting on the side of a flat bed without using bedrails?: Total Help needed moving to and from a bed to a chair (including a wheelchair)?: Total Help needed standing up from a chair using your arms (e.g., wheelchair or bedside chair)?: A Lot Help needed to walk in hospital room?: Total Help needed climbing 3-5 steps with a railing? : Total 6 Click Score: 8    End of Session Equipment Utilized During Treatment: Gait belt Activity Tolerance: Patient tolerated treatment well Patient left: in bed;with family/visitor present;with call bell/phone within reach;Other (comment) (4 bed rails set up as pt was found) Nurse Communication: Mobility status PT Visit Diagnosis: Repeated falls (R29.6);Muscle weakness (generalized) (M62.81);Difficulty in walking, not elsewhere classified (R26.2) Pain - Right/Left: Right     Time: 1105-1150 PT Time Calculation (min) (ACUTE ONLY): 45 min  Charges:                        Nicolette Bang, SPT 12/11/19. 1:09 PM

## 2019-12-11 NOTE — Progress Notes (Signed)
Pharmacy Antibiotic Note  Amanda Reyes is a 34 y.o. female admitted on 11/09/2019 with diverticulitis of colon with perforation. Patient day  23 s/p laparoscopic lysis of adhesions and drainage of intra-abdominal abscess with further placement of 3 drains. Pharmacy has been consulted for meropenem dosing. ID is following.   Day #23 Meropenem day #33 abx Leukocytosis remains present but stable Afebrile 2 drains in place Right Lower Quadrant: 15 ccs, more purluent  Left lower quadrant: 120 ccs appears more feculent    Cultures:  JP Drain 9/28: Enterococcus faecium, susceptibilities to follow Abscess 9/17: Lactobacillus and Candida Albicans Abscess 9/3: E. Coli & Streptococcus Group C, pan-sensitive  BCx 8/30: NG, final UCx 8/30: NG, final   Scr: 0.65 (10/1), good UOP    Plan: Continue meropenem 1g every 8 hours.Continue to monitor renal function daily throughout therapy.   Height: 5\' 9"  (175.3 cm) Weight: (!) 220.4 kg (486 lb) IBW/kg (Calculated) : 66.2  Temp (24hrs), Avg:98.7 F (37.1 C), Min:98.2 F (36.8 C), Max:99.3 F (37.4 C)  Recent Labs  Lab 12/07/19 0501 12/08/19 0611 12/09/19 0534 12/09/19 0537 12/10/19 0440 12/11/19 0613 12/11/19 0810  WBC 15.3* 13.3* 12.7*  --  12.2*  --  13.1*  CREATININE 0.53 0.54  --  0.58 0.55 0.65  --     Estimated Creatinine Clearance: 202 mL/min (by C-G formula based on SCr of 0.65 mg/dL).    Allergies  Allergen Reactions  . Penicillins Hives    Did it involve swelling of the face/tongue/throat, SOB, or low BP? No Did it involve sudden or severe rash/hives, skin peeling, or any reaction on the inside of your mouth or nose? Yes Did you need to seek medical attention at a hospital or doctor's office? No When did it last happen? If all above answers are "NO", may proceed with cephalosporin use..3  . Tomato Rash    Antimicrobials this admission: Meropenem 9/9 >> Fluconazole 9/18>>9/27 Anidulagungin 9/9  >>9/17 Metronidazole 8/30>>9/8  Ciprofloxacin 8/30>>9/8   Thank you for allowing pharmacy to be a part of this patient's care.  9/30 12/11/2019 9:41 AM

## 2019-12-11 NOTE — Progress Notes (Signed)
PROGRESS NOTE    Amanda Reyes  UTM:[id number] DOB: [birth year] DOA: 11/09/2019 PCP: Patient, No Pcp Per   Brief Narrative: Amanda Reyes is a 34 y.o. female status post operative drainage of intra-abdominal abscesses.  No evidence of perforation or frank stool.  Postoperative course complicated by hypoxic respiratory failure requiring intubation.  Now extubated. Course continues to be complicated by recurrent fever requiring repeat drain placement and continued IV antibiotics. Currently on TPN. Significant physical debility with likely SNF discharge.   Assessment & Plan:   Active Problems:   Diverticulitis of colon with perforation   Diverticulitis of large intestine with perforation and abscess   Intra-abdominal abscesses Patient is status post laparoscopic lysis of adhesions and drainage of abscesses.  Patient also had drain placement on 9/8 upsized on 9/17 in addition to the drain placed on 9/27 for chronic fevers and LLQ abscess.  Patient was previously on ciprofloxacin and flagyl, transitioned to  meropenem and anidulafungin -> fluconazole for treatment of infection/abscess continued E. coli with evidence of few lactobacillus species and rare Candida albicans.  Fluconazole was eventually.  Discontinued. Drain culture (9/28) shows few enterococcus faecium. LLQ drain is draining feculent appearing fluid. -ID recommendations: Meropenem -Discussed addition of coverage for enterococcus with ID; recommending not start at this time -Will obtain procalcitonin and trend  Recurrent fever Secondary to above.  Currently is afebrile but with recurrent high grade temperatures.  Acute respiratory failure with hypoxia and hypercapnia Patient required intubation and mechanical ventilation post surgery. Patient is currently on 2 L via nasal cannula. -Wean to room air as able  Essential hypertension Better controlled on hydrochlorothiazide. Patient is on losartan-hydrochlorothiazide as an  outpatient. -Continue HCTZ 25 mg daily  Tachycardia Patient has had persistent tachycardia this admission.  In setting of infection.  Patient was also amlodipine which may have been contributing but likely very minimally.  Currently not hemodynamically unstable.  No need for tight control at this time.  Malnutrition Morbid obesity Currently on TPN secondary to above. Also with evidence of hypoalbuminemia last albumin of 1.4.  Dietitian is consulted.  Patient is currently advanced to a regular diet. -General surgery recommendations for TPN/diet  Transaminitis Possibly secondary to TPN.  Currently stable.  History of cholecystectomy.  Last CT scan without evidence of biliary obstruction.  Previous fluid collection almost completely resolved on last CT (9/25). AST/ALT continue to climb. -Daily CMP  AKI Resolved.  Pre-diabetes Patient does not appear to have a diagnosis of diabetes.  Last hemoglobin A1c of 5.7%.  Complicated by significant obesity.  Patient will benefit from Metformin use on discharge as she likely has a significant amount of insulin resistance.  Patient is currently managed on sliding scale insulin while inpatient.   DVT prophylaxis: Per primary (Lovenox) Code Status:   Code Status: Full Code Family Communication: None at bedside Disposition Plan: Per primary, General Surgery   Antimicrobials:  Meropenem  Anidulafungin  Fluconazole    Subjective: Patient continues to have unrelenting abdominal pain now with suprapubic pressure. She is sure that her foley is causing her discomfort. She appears significantly uncomfortable. She appears to be draining her bladder adequately looking at UOP. We discussed transfusion and she only wants blood if ABSOLUTELY necessary.  Objective: Vitals:   12/10/19 2021 12/11/19 0005 12/11/19 0328 12/11/19 0800  BP: (!) 146/100 134/85 (!) 149/91 (!) 156/108  Pulse: (!) 117 (!) 114 (!) 124 (!) 125  Resp: (!) 24 (!) 24 (!) 22 (!) 22   Temp:  99.3 F (37.4 C) 99 F (37.2 C) 98.2 F (36.8 C) 98.2 F (36.8 C)  TempSrc: Oral Oral Oral Oral  SpO2: 95% 96% 99% 99%  Weight:      Height:        Intake/Output Summary (Last 24 hours) at 12/11/2019 0920 Last data filed at 12/11/2019 0800 Gross per 24 hour  Intake 4732.84 ml  Output 1400 ml  Net 3332.84 ml   Filed Weights   12/03/19 0423 12/05/19 0438 12/07/19 0454  Weight: (!) 198 kg (!) 224.5 kg (!) 220.4 kg    Examination:  General exam: Appears calm and uncomfortable/in pain Respiratory system: Clear/diminished to auscultation. Respiratory effort normal. Cardiovascular system: S1 & S2 heard, Tachycardia, normal rhythm. No murmurs, rubs, gallops or clicks. Gastrointestinal system: Abdomen is obese, soft and generally tender. No organomegaly or masses felt. Decreased bowel sounds heard. Central nervous system: Alert and oriented. No focal neurological deficits. Musculoskeletal: No calf tenderness Skin: No cyanosis. No rashes Psychiatry: Judgement and insight appear normal.    Data Reviewed: I have personally reviewed following labs and imaging studies  CBC Lab Results  Component Value Date   WBC 13.1 (H) 12/11/2019   RBC 3.34 (L) 12/11/2019   HGB 8.1 (L) 12/11/2019   HCT 25.6 (L) 12/11/2019   MCV 76.6 (L) 12/11/2019   MCH 24.3 (L) 12/11/2019   PLT 325 12/11/2019   MCHC 31.6 12/11/2019   RDW 20.0 (H) 12/11/2019   LYMPHSABS 1.9 12/10/2019   MONOABS 1.1 (H) 12/10/2019   EOSABS 0.2 12/10/2019   BASOSABS 0.1 12/10/2019     Last metabolic panel Lab Results  Component Value Date   NA 133 (L) 12/11/2019   K 4.1 12/11/2019   CL 98 12/11/2019   CO2 27 12/11/2019   BUN 22 (H) 12/11/2019   CREATININE 0.65 12/11/2019   GLUCOSE 203 (H) 12/11/2019   GFRNONAA >60 12/11/2019   GFRAA >60 12/11/2019   CALCIUM 8.1 (L) 12/11/2019   PHOS 3.6 12/10/2019   PROT 8.1 12/10/2019   ALBUMIN 1.5 (L) 12/10/2019   BILITOT 0.4 12/10/2019   ALKPHOS 193 (H) 12/10/2019    AST 44 (H) 12/10/2019   ALT 103 (H) 12/10/2019   ANIONGAP 8 12/11/2019    CBG (last 3)  Recent Labs    12/10/19 1654 12/11/19 0002 12/11/19 0604  GLUCAP 149* 164* 176*     GFR: Estimated Creatinine Clearance: 202 mL/min (by C-G formula based on SCr of 0.65 mg/dL).  Coagulation Profile: No results for input(s): INR, PROTIME in the last 168 hours.  Recent Results (from the past 240 hour(s))  Body fluid culture     Status: None (Preliminary result)   Collection Time: 12/08/19  9:56 PM   Specimen: JP Drain; Body Fluid  Result Value Ref Range Status   Specimen Description   Final    JP DRAINAGE Performed at [facility], [address] [place], [place] [address]    Special Requests   Final    NONE Performed at [facility], [address] [place], [place] [address]    Gram Stain   Final    ABUNDANT WBC PRESENT, PREDOMINANTLY PMN RARE GRAM POSITIVE COCCI IN PAIRS    Culture   Final    FEW GRAM NEGATIVE RODS FEW ENTEROCOCCUS FAECIUM CULTURE REINCUBATED FOR BETTER GROWTH Performed at [facility], [address], [place], [place] [address]    Report Status PENDING  Incomplete        Radiology Studies:  No results found.      Scheduled Meds: . sodium chloride   Intravenous Once  . amLODipine  10 mg Oral Daily  . Chlorhexidine Gluconate Cloth  6 each Topical Q0600  . enoxaparin (LOVENOX) injection  40 mg Subcutaneous Q12H  . feeding supplement (ENSURE ENLIVE)  237 mL Oral TID BM  . hydrochlorothiazide  25 mg Oral Daily  . insulin aspart  0-20 Units Subcutaneous Q6H  . mouth rinse  15 mL Mouth Rinse q12n4p  . pantoprazole (PROTONIX) IV  40 mg Intravenous Q12H  . sodium chloride flush  10-40 mL Intracatheter Q12H  . sodium chloride flush  5 mL Intracatheter Q8H  . sodium chloride flush  5 mL Intracatheter Q8H   Continuous Infusions: . sodium chloride    . sodium chloride 10 mL/hr at 12/11/19 0800  . meropenem (MERREM) IV  Stopped (12/11/19 0657)  . TPN ADULT (ION) 90 mL/hr at 12/11/19 0800     LOS: 32 days     Jacquelin Hawking, MD [facility] 12/11/2019, 9:20 AM  If 7PM-7AM, please contact night-coverage [email or web address]

## 2019-12-11 NOTE — Consult Note (Signed)
PHARMACY - TOTAL PARENTERAL NUTRITION CONSULT NOTE   Indication: Prolonged ileus  Patient Measurements: Height: 5\' 9"  (175.3 cm) Weight: (!) 220.4 kg (486 lb) IBW/kg (Calculated) : 66.2 TPN AdjBW (KG): 90.5 Body mass index is 71.77 kg/m.  Assessment: 34 year old female with PMHx of HTN admitted with concern for perforated diverticulitis with contained abscesses. Patient is POD # 23 from laparoscopic lysis of adhesions and drainage of intra-abdominal abscess.   Glucose / Insulin: A1C 5.7, BG range 147 - 182: 15 units resistant SSI required.  Electrolytes: Cl:Ac ratio improved and stable, sodium trending lower,   magnesium at lower end of range Renal: SCr < 1, stable Renal function remains normal, SCr < 1, stable LFTs / TGs: LFTs WNL at baseline, ALT trending up, TG 69-->193->245, bilirubin WNL Prealbumin / albumin: 8.6 / 1.4 Intake / Output; MIVF: Continue to monitor I&Os  GI Imaging: 9/7 CT abdomen pelvis: colonic perforation related to diverticulitis with worsening fluid and gas containing abscesses is throughout the low abdomen and pelvis many with interloop and mesenteric involvement, also with multiple areas of pelvic fluid as well showing generalized inflammation tracking to the mid abdomen. Interval decompression of the abscess in the cul-de-sac following drain placement. Extensive body wall edema.  9/15 CT concerning for perihepatic fluid collection and possible abscess 9/28 CT-guided percutaneous drainage of left lower quadrant abscess containing mostly gas and also yielding some bloody fluid  Surgeries / Procedures:  9/8 Robotic assisted laparoscopic lysis of adhesions and drainage of intra-abdominal abscess  9/17 IR placement of drain in perihepatic fluid collection  9/27 IR drain of LLQ abscess 9/28 CT-guided percutaneous drainage of left lower quadrant abscess  Central access: 11/19/19 TPN start date: 11/19/19  Nutritional Goals  kCal: 2700 - 3000 kCal/day, Protein:  >135 g/day, Fluid: > 2000 mL Goal TPN rate is 90 mL/hr (provides 140 g of protein and 2999 kcals per day)  Current Nutrition:  Advanced to full liquids on 9/22 then advanced to soft diet on 9/23 then back to clear liquids on 9/26, trial of soft diet 10/1  Plan:   Due to elevated LFTs adjust TPN to 45 mL/hr using concentrated amino acids: total volume 1180 mL  Nutritional components: 21% dextrose (226.8g), 65 g/L (70.2g) amino acids, 41.5 g/L (44.8g) lipids, 1499 kCal  Electrolytes in TPN: 110mEq/L of Na, 15 mEq/L of K, 62mEq/L of Ca, 5 mEq/L of Mg, phosphorous 15 mmol/L, continue Cl:Ac ratio at max (currently 1.7 : 1)  Sodium increased to 100 mEq/L 9/28  Magnesium increased to 5 mEq/L 9/29  Add standard MVI and trace elements to TPN  Continue resistant SSI to q6h SSI and continue to adjust as needed - 10 units insulin regular in TPN   Monitor TPN labs daily until stable then on Mon/Thurs, next in am 10/2   12/2 12/11/2019 6:57 AM

## 2019-12-12 DIAGNOSIS — D649 Anemia, unspecified: Secondary | ICD-10-CM

## 2019-12-12 DIAGNOSIS — R7303 Prediabetes: Secondary | ICD-10-CM | POA: Diagnosis not present

## 2019-12-12 DIAGNOSIS — N179 Acute kidney failure, unspecified: Secondary | ICD-10-CM

## 2019-12-12 DIAGNOSIS — R000 Tachycardia, unspecified: Secondary | ICD-10-CM

## 2019-12-12 DIAGNOSIS — K5720 Diverticulitis of large intestine with perforation and abscess without bleeding: Secondary | ICD-10-CM | POA: Diagnosis not present

## 2019-12-12 LAB — BODY FLUID CULTURE

## 2019-12-12 LAB — CBC
HCT: 23.5 % — ABNORMAL LOW (ref 36.0–46.0)
Hemoglobin: 7.3 g/dL — ABNORMAL LOW (ref 12.0–15.0)
MCH: 24.1 pg — ABNORMAL LOW (ref 26.0–34.0)
MCHC: 31.1 g/dL (ref 30.0–36.0)
MCV: 77.6 fL — ABNORMAL LOW (ref 80.0–100.0)
Platelets: 294 10*3/uL (ref 150–400)
RBC: 3.03 MIL/uL — ABNORMAL LOW (ref 3.87–5.11)
RDW: 20.5 % — ABNORMAL HIGH (ref 11.5–15.5)
WBC: 9.3 10*3/uL (ref 4.0–10.5)
nRBC: 0.5 % — ABNORMAL HIGH (ref 0.0–0.2)

## 2019-12-12 LAB — RENAL FUNCTION PANEL
Albumin: 1.5 g/dL — ABNORMAL LOW (ref 3.5–5.0)
Anion gap: 8 (ref 5–15)
BUN: 19 mg/dL (ref 6–20)
CO2: 28 mmol/L (ref 22–32)
Calcium: 8.5 mg/dL — ABNORMAL LOW (ref 8.9–10.3)
Chloride: 98 mmol/L (ref 98–111)
Creatinine, Ser: 0.54 mg/dL (ref 0.44–1.00)
GFR calc Af Amer: >60 mL/min (ref >60)
GFR calc non Af Amer: >60 mL/min (ref >60)
Glucose, Bld: 127 mg/dL — ABNORMAL HIGH (ref 70–99)
Phosphorus: 4.5 mg/dL (ref 2.5–4.6)
Potassium: 3.9 mmol/L (ref 3.5–5.1)
Sodium: 134 mmol/L — ABNORMAL LOW (ref 135–145)

## 2019-12-12 LAB — PROCALCITONIN: Procalcitonin: 0.17 ng/mL

## 2019-12-12 LAB — GLUCOSE, CAPILLARY
Glucose-Capillary: 130 mg/dL — ABNORMAL HIGH (ref 70–99)
Glucose-Capillary: 140 mg/dL — ABNORMAL HIGH (ref 70–99)
Glucose-Capillary: 126 mg/dL — ABNORMAL HIGH (ref 70–99)

## 2019-12-12 LAB — MAGNESIUM: Magnesium: 1.6 mg/dL — ABNORMAL LOW (ref 1.7–2.4)

## 2019-12-12 MED ORDER — TRACE MINERALS CU-MN-SE-ZN 300-55-60-3000 MCG/ML IV SOLN
INTRAVENOUS | Status: AC
  Filled 2019-12-12: qty 468

## 2019-12-12 MED ORDER — CHLORHEXIDINE GLUCONATE CLOTH 2 % EX PADS
6.0000 | MEDICATED_PAD | Freq: Every day | CUTANEOUS | Status: DC
  Administered 2019-12-12 – 2019-12-23 (×11): 6 via TOPICAL

## 2019-12-12 MED ORDER — MAGNESIUM SULFATE 2 GM/50ML IV SOLN
2.0000 g | Freq: Once | INTRAVENOUS | Status: AC
  Administered 2019-12-12: 2 g via INTRAVENOUS
  Filled 2019-12-12: qty 50

## 2019-12-12 NOTE — Progress Notes (Signed)
Subjective:  CC: Amanda Reyes is a 34 y.o. female  Hospital stay day 53, 24 Days Post-Op s/p robotic assisted laparoscopic lysis of adhesion and drainage of intra-abdominal abscess WITHOUT evidence of perforation or frank stoolforperforated diverticulitis, complicated by hypoxic respiratory failure requiring post-operative intubation(since extubated)  HPI: No acute issues overnight.  Tolerating soft, some abd pain on RLQ  ROS:  General: Denies weight loss, weight gain, fatigue, fevers, chills, and night sweats. Heart: Denies chest pain, palpitations, racing heart, irregular heartbeat, leg pain or swelling, and decreased activity tolerance. Respiratory: Denies breathing difficulty, shortness of breath, wheezing, cough, and sputum. GI: Denies change in appetite, heartburn, nausea, vomiting, constipation, diarrhea, and blood in stool. GU: Denies difficulty urinating, pain with urinating, urgency, frequency, blood in urine.   Objective:   Temp:  [98 F (36.7 C)-99.4 F (37.4 C)] 99.4 F (37.4 C) (10/02 1142) Pulse Rate:  [113-119] 113 (10/02 1142) Resp:  [22-28] 24 (10/02 0547) BP: (128-143)/(84-98) 132/98 (10/02 1142) SpO2:  [96 %-100 %] 96 % (10/02 1142)     Height: 5\' 9"  (175.3 cm) Weight: (!) 220.4 kg BMI (Calculated): 71.74   Intake/Output this shift:   Intake/Output Summary (Last 24 hours) at 12/12/2019 1147 Last data filed at 12/12/2019 1022 Gross per 24 hour  Intake 2329.84 ml  Output 2930 ml  Net -600.16 ml    Constitutional :  alert, cooperative, appears stated age and no distress  Respiratory:  clear to auscultation bilaterally  Cardiovascular:  tachy rate  Gastrointestinal: soft, some minor TTP in RLQ.  Right lower quadrant drain with purulent output.  Left lower quadrant drain with feculent output..   Skin: Cool and moist.   Psychiatric: Normal affect, non-agitated, not confused       LABS:  CMP Latest Ref Rng & Units 12/11/2019 12/10/2019 12/09/2019  Glucose  70 - 99 mg/dL 12/11/2019) 782(N) 562(Z)  BUN 6 - 20 mg/dL 308(M) 19 18  Creatinine 0.44 - 1.00 mg/dL 57(Q 4.69 6.29  Sodium 135 - 145 mmol/L 133(L) 134(L) 134(L)  Potassium 3.5 - 5.1 mmol/L 4.1 3.9 3.7  Chloride 98 - 111 mmol/L 98 99 100  CO2 22 - 32 mmol/L 27 27 28   Calcium 8.9 - 10.3 mg/dL 8.1(L) 7.7(L) 7.8(L)  Total Protein 6.5 - 8.1 g/dL - 8.1 -  Total Bilirubin 0.3 - 1.2 mg/dL - 0.4 -  Alkaline Phos 38 - 126 U/L - 193(H) -  AST 15 - 41 U/L - 44(H) -  ALT 0 - 44 U/L - 103(H) -   CBC Latest Ref Rng & Units 12/12/2019 12/11/2019 12/10/2019  WBC 4.0 - 10.5 K/uL 9.3 13.1(H) 12.2(H)  Hemoglobin 12.0 - 15.0 g/dL 7.3(L) 8.1(L) 7.5(L)  Hematocrit 36 - 46 % 23.5(L) 25.6(L) 23.1(L)  Platelets 150 - 400 K/uL 294 325 282    RADS: N/A Assessment:   s/p robotic assisted laparoscopic lysis of adhesion and drainage of intra-abdominal abscess WITHOUT evidence of perforation or frank stoolforperforated diverticulitis, complicated by hypoxic respiratory failure requiring post-operative intubation(since extubated)  Currently remains stable clinically, labs continue to remain reassuring as well.  Leukocytosis resolved today.  Hemoglobin with no significant drop.  Per patient request, will only transfuse as necessary.  Drain output and characteristic remains stable.  Continue current management.  We will continue TPN over the weekend to supplement nutrition.  Appreciate hospitalist management of comorbidities

## 2019-12-12 NOTE — Progress Notes (Signed)
Mobility Specialist - Progress Note   12/12/19 1644  Mobility  Activity Refused mobility  Mobility performed by Mobility specialist    Pt declined mobility for a second time this date d/t feeling tired and out of it. Pt stated it's been due to her medication given recently. Will attempt session at another date.    Filiberto Pinks Mobility Specialist 12/12/19, 4:46 PM

## 2019-12-12 NOTE — Progress Notes (Signed)
PROGRESS NOTE    Amanda Reyes  QAS:[id number] DOB: September 03, 1985 DOA: 11/09/2019 PCP: Patient, No Pcp Per   Brief Narrative: Amanda Reyes is a 34 y.o. female status post operative drainage of intra-abdominal abscesses.  No evidence of perforation or frank stool.  Postoperative course complicated by hypoxic respiratory failure requiring intubation.  Now extubated. Course continues to be complicated by recurrent fever requiring repeat drain placement and continued IV antibiotics. Currently on TPN. Significant physical debility with likely SNF discharge.   Assessment & Plan:   Active Problems:   Diverticulitis of colon with perforation   Diverticulitis of large intestine with perforation and abscess   Intra-abdominal abscesses Patient is status post laparoscopic lysis of adhesions and drainage of abscesses.  Patient also had drain placement on 9/8 upsized on 9/17 in addition to the drain placed on 9/27 for chronic fevers and LLQ abscess.  Patient was previously on ciprofloxacin and flagyl, transitioned to  meropenem and anidulafungin -> fluconazole for treatment of infection/abscess continued E. coli with evidence of few lactobacillus species and rare Candida albicans.  Fluconazole was eventually.  Discontinued. Drain culture (9/28) shows few enterococcus faecium which is sensitive to Vancomycin. LLQ drain continues to drain feculent appearing fluid. -ID recommendations: Meropenem, Linezolid  Recurrent fever Secondary to above.  Resolved. Currently is afebrile but with recurrent high grade temperatures.  Acute respiratory failure with hypoxia and hypercapnia Patient required intubation and mechanical ventilation post surgery. Weaned to room air.  Essential hypertension Better controlled on hydrochlorothiazide. Patient is on losartan-hydrochlorothiazide as an outpatient. -Continue HCTZ 25 mg daily  Anemia Likely iron deficient combined with acute illness. Menstruating female,  however hemoglobin has been normal prior to illness. No iron panel available. She has received at least 1 unit of PRBC to date but it is difficult to see if she got PRBC on 9/8. Patient was up with PT without symptoms of anemia. Currently menstruating, however her bleeding is subsiding.  Tachycardia Patient has had persistent tachycardia this admission.  In setting of infection.  Patient was also amlodipine which may have been contributing but likely very minimally.  Currently not hemodynamically unstable.  No need for tight control at this time.  Malnutrition Morbid obesity Currently on TPN secondary to above. Also with evidence of hypoalbuminemia last albumin of 1.4.  Dietitian is consulted.  Patient is currently advanced to a regular diet. -General surgery recommendations for TPN/diet  Transaminitis Possibly secondary to TPN.  Currently stable.  History of cholecystectomy.  Last CT scan without evidence of biliary obstruction.  Previous fluid collection almost completely resolved on last CT (9/25). AST/ALT continue to climb. -Daily CMP  AKI Resolved.  Pre-diabetes Patient does not appear to have a diagnosis of diabetes.  Last hemoglobin A1c of 5.7%.  Complicated by significant obesity.  Patient will benefit from Metformin use on discharge as she likely has a significant amount of insulin resistance.  Patient is currently managed on sliding scale insulin while inpatient.   DVT prophylaxis: Per primary (Lovenox) Code Status:   Code Status: Full Code Family Communication: Mother at bedside Disposition Plan: Per primary, General Surgery   Antimicrobials:  Meropenem  Anidulafungin  Fluconazole    Subjective: Abdominal pain improved today after foley was repositioned yesterday. Worked with PT and stood up.  Objective: Vitals:   12/11/19 1635 12/11/19 1956 12/11/19 2340 12/12/19 0547  BP:  (!) 143/89 (!) 133/95 (!) 128/98  Pulse:  (!) 118 (!) 113 (!) 113  Resp: (!) 22 (!) 24  (!)  22 (!) 24  Temp:  98.9 F (37.2 C) 99.2 F (37.3 C) 98.9 F (37.2 C)  TempSrc:  Oral Oral Oral  SpO2:  97% 100% 98%  Weight:      Height:        Intake/Output Summary (Last 24 hours) at 12/12/2019 0811 Last data filed at 12/12/2019 0558 Gross per 24 hour  Intake 2307.52 ml  Output 3380 ml  Net -1072.48 ml   Filed Weights   12/03/19 0423 12/05/19 0438 12/07/19 0454  Weight: (!) 198 kg (!) 224.5 kg (!) 220.4 kg    Examination:  General exam: Appears calm and comfortable Respiratory system: Clear to auscultation. Respiratory effort normal. Cardiovascular system: S1 & S2 heard, RRR. No murmurs, rubs, gallops or clicks. Gastrointestinal system: Abdomen is obese, nondistended, soft and tender in RUQ. No organomegaly or masses felt. Normal bowel sounds heard. Central nervous system: Lethargic but easily arouses and is oriented. No focal neurological deficits. Musculoskeletal: LE edema. No calf tenderness Skin: No cyanosis. No rashes Psychiatry: Judgement and insight appear normal. Mood & affect appropriate.     Data Reviewed: I have personally reviewed following labs and imaging studies  CBC Lab Results  Component Value Date   WBC 9.3 12/12/2019   RBC 3.03 (L) 12/12/2019   HGB 7.3 (L) 12/12/2019   HCT 23.5 (L) 12/12/2019   MCV 77.6 (L) 12/12/2019   MCH 24.1 (L) 12/12/2019   PLT 294 12/12/2019   MCHC 31.1 12/12/2019   RDW 20.5 (H) 12/12/2019   LYMPHSABS 1.9 12/10/2019   MONOABS 1.1 (H) 12/10/2019   EOSABS 0.2 12/10/2019   BASOSABS 0.1 12/10/2019     Last metabolic panel Lab Results  Component Value Date   NA 133 (L) 12/11/2019   K 4.1 12/11/2019   CL 98 12/11/2019   CO2 27 12/11/2019   BUN 22 (H) 12/11/2019   CREATININE 0.65 12/11/2019   GLUCOSE 203 (H) 12/11/2019   GFRNONAA >60 12/11/2019   GFRAA >60 12/11/2019   CALCIUM 8.1 (L) 12/11/2019   PHOS 3.6 12/10/2019   PROT 8.1 12/10/2019   ALBUMIN 1.5 (L) 12/10/2019   BILITOT 0.4 12/10/2019   ALKPHOS  193 (H) 12/10/2019   AST 44 (H) 12/10/2019   ALT 103 (H) 12/10/2019   ANIONGAP 8 12/11/2019    CBG (last 3)  Recent Labs    12/11/19 1756 12/11/19 2308 12/12/19 0601  GLUCAP 167* 139* 130*     GFR: Estimated Creatinine Clearance: 202 mL/min (by C-G formula based on SCr of 0.65 mg/dL).  Coagulation Profile: No results for input(s): INR, PROTIME in the last 168 hours.  Recent Results (from the past 240 hour(s))  Body fluid culture     Status: None (Preliminary result)   Collection Time: 12/08/19  9:56 PM   Specimen: JP Drain; Body Fluid  Result Value Ref Range Status   Specimen Description   Final    JP DRAINAGE Performed at [facility], [address], [place], [place] [address]    Special Requests   Final    NONE Performed at [facility], [address] [place], [place] [address]    Gram Stain   Final    ABUNDANT WBC PRESENT, PREDOMINANTLY PMN RARE GRAM POSITIVE COCCI IN PAIRS    Culture   Final    FEW ESCHERICHIA COLI FEW ENTEROCOCCUS FAECIUM SUSCEPTIBILITIES TO FOLLOW Performed at [facility], [address] [place], [place] [address]    Report Status PENDING  Incomplete  Organism ID, Bacteria ESCHERICHIA COLI  Final      Susceptibility   Escherichia coli - MIC*    AMPICILLIN >=32 RESISTANT Resistant     CEFAZOLIN <=4 SENSITIVE Sensitive     CEFEPIME <=0.12 SENSITIVE Sensitive     CEFTAZIDIME <=1 SENSITIVE Sensitive     CEFTRIAXONE <=0.25 SENSITIVE Sensitive     CIPROFLOXACIN >=4 RESISTANT Resistant     GENTAMICIN >=16 RESISTANT Resistant     IMIPENEM <=0.25 SENSITIVE Sensitive     TRIMETH/SULFA >=320 RESISTANT Resistant     AMPICILLIN/SULBACTAM >=32 RESISTANT Resistant     PIP/TAZO <=4 SENSITIVE Sensitive     * FEW ESCHERICHIA COLI        Radiology Studies: No results found.      Scheduled Meds: . sodium chloride   Intravenous Once  . amLODipine  10 mg Oral Daily  . Chlorhexidine Gluconate Cloth  6 each  Topical Q0600  . enoxaparin (LOVENOX) injection  40 mg Subcutaneous Q12H  . feeding supplement (ENSURE ENLIVE)  237 mL Oral TID BM  . hydrochlorothiazide  25 mg Oral Daily  . insulin aspart  0-20 Units Subcutaneous Q6H  . mouth rinse  15 mL Mouth Rinse q12n4p  . pantoprazole (PROTONIX) IV  40 mg Intravenous Q12H  . sodium chloride flush  10-40 mL Intracatheter Q12H  . sodium chloride flush  5 mL Intracatheter Q8H  . sodium chloride flush  5 mL Intracatheter Q8H   Continuous Infusions: . sodium chloride    . sodium chloride 10 mL/hr at 12/12/19 0300  . linezolid (ZYVOX) IV Stopped (12/12/19 0122)  . magnesium sulfate bolus IVPB    . meropenem (MERREM) IV 1 g (12/12/19 0616)  . TPN ADULT (ION) 45 mL/hr at 12/12/19 0300     LOS: 33 days     Jacquelin Hawking, MD [facility] 12/12/2019, 8:11 AM  If 7PM-7AM, please contact night-coverage [email or web address]

## 2019-12-12 NOTE — Consult Note (Addendum)
PHARMACY - TOTAL PARENTERAL NUTRITION CONSULT NOTE   Indication: Prolonged ileus  Patient Measurements: Height: 5\' 9"  (175.3 cm) Weight: (!) 220.4 kg (486 lb) IBW/kg (Calculated) : 66.2 TPN AdjBW (KG): 90.5 Body mass index is 71.77 kg/m.  Assessment: 34 year old female with PMHx of HTN admitted with concern for perforated diverticulitis with contained abscesses. Patient is POD # 23 from laparoscopic lysis of adhesions and drainage of intra-abdominal abscess.   Glucose / Insulin: A1C 5.7, BG range 147 - 182: 15 units resistant SSI required.  Electrolytes: Cl:Ac ratio improved and stable, sodium trending lower,   Magnesium 1.6 Renal: SCr < 1, stable Renal function remains normal, SCr < 1, stable LFTs / TGs: LFTs WNL at baseline, ALT trending up, TG 69-->193->245, bilirubin WNL Prealbumin / albumin: 8.6 / 1.4 Intake / Output; MIVF: Continue to monitor I&Os  GI Imaging: 9/7 CT abdomen pelvis: colonic perforation related to diverticulitis with worsening fluid and gas containing abscesses is throughout the low abdomen and pelvis many with interloop and mesenteric involvement, also with multiple areas of pelvic fluid as well showing generalized inflammation tracking to the mid abdomen. Interval decompression of the abscess in the cul-de-sac following drain placement. Extensive body wall edema.  9/15 CT concerning for perihepatic fluid collection and possible abscess 9/28 CT-guided percutaneous drainage of left lower quadrant abscess containing mostly gas and also yielding some bloody fluid  Surgeries / Procedures:  9/8 Robotic assisted laparoscopic lysis of adhesions and drainage of intra-abdominal abscess  9/17 IR placement of drain in perihepatic fluid collection  9/27 IR drain of LLQ abscess 9/28 CT-guided percutaneous drainage of left lower quadrant abscess  Central access: 11/19/19 TPN start date: 11/19/19  Nutritional Goals  kCal: 2700 - 3000 kCal/day, Protein: >135 g/day, Fluid:  > 2000 mL Goal TPN rate is 90 mL/hr (provides 140 g of protein and 2999 kcals per day)  Current Nutrition:  Advanced to full liquids on 9/22 then advanced to soft diet on 9/23 then back to clear liquids on 9/26, trial of soft diet 10/1  Plan:   Due to elevated LFTs adjust TPN to 45 mL/hr using concentrated amino acids: total volume 1180 mL  Nutritional components: 21% dextrose (226.8g), 65 g/L (70.2g) amino acids, 41.5 g/L (44.8g) lipids, 1499 kCal  Electrolytes in TPN: 11mEq/L of Na, 15 mEq/L of K, 63mEq/L of Ca, 5 mEq/L of Mg, phosphorous 15 mmol/L, continue Cl:Ac ratio at max (currently 1.7 : 1)  Sodium increased to 100 mEq/L 9/28  Magnesium increased to 7 mEq/L 10/2  Add standard MVI and trace elements to TPN  Ordered Magnesium sulfate 2 gm IV x 1 and increased Mag in TPN  Continue resistant SSI to q6h SSI and continue to adjust as needed - 10 units insulin regular in TPN   Monitor TPN labs daily until stable then on Mon/Thurs, next in am 10/2   Jaryn Rosko A 12/12/2019 10:32 AM

## 2019-12-13 DIAGNOSIS — K5720 Diverticulitis of large intestine with perforation and abscess without bleeding: Secondary | ICD-10-CM | POA: Diagnosis not present

## 2019-12-13 DIAGNOSIS — R000 Tachycardia, unspecified: Secondary | ICD-10-CM | POA: Diagnosis not present

## 2019-12-13 DIAGNOSIS — R7303 Prediabetes: Secondary | ICD-10-CM | POA: Diagnosis not present

## 2019-12-13 DIAGNOSIS — D649 Anemia, unspecified: Secondary | ICD-10-CM | POA: Diagnosis not present

## 2019-12-13 LAB — GLUCOSE, CAPILLARY
Glucose-Capillary: 119 mg/dL — ABNORMAL HIGH (ref 70–99)
Glucose-Capillary: 129 mg/dL — ABNORMAL HIGH (ref 70–99)
Glucose-Capillary: 130 mg/dL — ABNORMAL HIGH (ref 70–99)
Glucose-Capillary: 131 mg/dL — ABNORMAL HIGH (ref 70–99)

## 2019-12-13 LAB — MAGNESIUM: Magnesium: 1.6 mg/dL — ABNORMAL LOW (ref 1.7–2.4)

## 2019-12-13 LAB — CBC
HCT: 23 % — ABNORMAL LOW (ref 36.0–46.0)
Hemoglobin: 7.4 g/dL — ABNORMAL LOW (ref 12.0–15.0)
MCH: 24.3 pg — ABNORMAL LOW (ref 26.0–34.0)
MCHC: 32.2 g/dL (ref 30.0–36.0)
MCV: 75.7 fL — ABNORMAL LOW (ref 80.0–100.0)
Platelets: 290 10*3/uL (ref 150–400)
RBC: 3.04 MIL/uL — ABNORMAL LOW (ref 3.87–5.11)
RDW: 20.5 % — ABNORMAL HIGH (ref 11.5–15.5)
WBC: 9 10*3/uL (ref 4.0–10.5)
nRBC: 0.3 % — ABNORMAL HIGH (ref 0.0–0.2)

## 2019-12-13 MED ORDER — TRACE MINERALS CU-MN-SE-ZN 300-55-60-3000 MCG/ML IV SOLN
INTRAVENOUS | Status: AC
  Filled 2019-12-13: qty 468

## 2019-12-13 MED ORDER — MAGNESIUM SULFATE 4 GM/100ML IV SOLN
4.0000 g | Freq: Once | INTRAVENOUS | Status: AC
  Administered 2019-12-13: 4 g via INTRAVENOUS
  Filled 2019-12-13: qty 100

## 2019-12-13 NOTE — Progress Notes (Signed)
Subjective:  CC: Amanda Reyes is a 34 y.o. female  Hospital stay day 16, 25 Days Post-Op s/p robotic assisted laparoscopic lysis of adhesion and drainage of intra-abdominal abscess WITHOUT evidence of perforation or frank stoolforperforated diverticulitis, complicated by hypoxic respiratory failure requiring post-operative intubation(since extubated)  HPI: Right lower quadrant drain sutured noted to be pulled away from skin. Drain itself did not seem to be dislodged significantly so this was resecured to the skin using 3-0 nylon at bedside. Otherwise no other issues overnight.  ROS:  General: Denies weight loss, weight gain, fatigue, fevers, chills, and night sweats. Heart: Denies chest pain, palpitations, racing heart, irregular heartbeat, leg pain or swelling, and decreased activity tolerance. Respiratory: Denies breathing difficulty, shortness of breath, wheezing, cough, and sputum. GI: Denies change in appetite, heartburn, nausea, vomiting, constipation, diarrhea, and blood in stool. GU: Denies difficulty urinating, pain with urinating, urgency, frequency, blood in urine.   Objective:   Temp:  [98.4 F (36.9 C)-99.4 F (37.4 C)] 98.9 F (37.2 C) (10/03 1156) Pulse Rate:  [106-115] 106 (10/03 1156) Resp:  [16-24] 16 (10/03 1156) BP: (112-139)/(71-97) 139/97 (10/03 1156) SpO2:  [93 %-100 %] 93 % (10/03 1156)     Height: 5\' 9"  (175.3 cm) Weight: (!) 220.4 kg BMI (Calculated): 71.74   Intake/Output this shift:   Intake/Output Summary (Last 24 hours) at 12/13/2019 1414 Last data filed at 12/13/2019 1408 Gross per 24 hour  Intake 3757.29 ml  Output 100 ml  Net 3657.29 ml    Constitutional :  alert, cooperative, appears stated age and no distress  Respiratory:  clear to auscultation bilaterally  Cardiovascular:  tachy rate  Gastrointestinal: soft, some minor TTP in RLQ.  Right lower quadrant drain with purulent output.  Left lower quadrant drain with feculent output. No  significant difference in quantity or quality of output.  Skin: Cool and moist.   Psychiatric: Normal affect, non-agitated, not confused       LABS:  CMP Latest Ref Rng & Units 12/12/2019 12/11/2019 12/10/2019  Glucose 70 - 99 mg/dL 12/12/2019) 539(J) 673(A)  BUN 6 - 20 mg/dL 19 193(X) 19  Creatinine 0.44 - 1.00 mg/dL 90(W 4.09 7.35  Sodium 135 - 145 mmol/L 134(L) 133(L) 134(L)  Potassium 3.5 - 5.1 mmol/L 3.9 4.1 3.9  Chloride 98 - 111 mmol/L 98 98 99  CO2 22 - 32 mmol/L 28 27 27   Calcium 8.9 - 10.3 mg/dL 3.29) 8.1(L) 7.7(L)  Total Protein 6.5 - 8.1 g/dL - - 8.1  Total Bilirubin 0.3 - 1.2 mg/dL - - 0.4  Alkaline Phos 38 - 126 U/L - - 193(H)  AST 15 - 41 U/L - - 44(H)  ALT 0 - 44 U/L - - 103(H)   CBC Latest Ref Rng & Units 12/13/2019 12/12/2019 12/11/2019  WBC 4.0 - 10.5 K/uL 9.0 9.3 13.1(H)  Hemoglobin 12.0 - 15.0 g/dL 7.4(L) 7.3(L) 8.1(L)  Hematocrit 36 - 46 % 23.0(L) 23.5(L) 25.6(L)  Platelets 150 - 400 K/uL 290 294 325    RADS: N/A Assessment:   s/p robotic assisted laparoscopic lysis of adhesion and drainage of intra-abdominal abscess WITHOUT evidence of perforation or frank stoolforperforated diverticulitis, complicated by hypoxic respiratory failure requiring post-operative intubation(since extubated)  Currently remains stable clinically, labs continue to remain reassuring as well.  Leukocytosis resolved today.  Hemoglobin with no significant drop.  Per patient request, will only transfuse as necessary.  Drain output and characteristic remains stable.  Continue current management. With finalizing disposition with primary surgeon.  we will continue TPN over the weekend to supplement nutrition.  Appreciate hospitalist management of comorbidities

## 2019-12-13 NOTE — Consult Note (Signed)
PHARMACY - TOTAL PARENTERAL NUTRITION CONSULT NOTE   Indication: Prolonged ileus  Patient Measurements: Height: 5\' 9"  (175.3 cm) Weight: (!) 220.4 kg (486 lb) IBW/kg (Calculated) : 66.2 TPN AdjBW (KG): 90.5 Body mass index is 71.77 kg/m.  Assessment: 34 year old female with PMHx of HTN admitted with concern for perforated diverticulitis with contained abscesses. Patient is POD # 23 from laparoscopic lysis of adhesions and drainage of intra-abdominal abscess.   Glucose / Insulin: A1C 5.7, BG range 126 - 140: 12 units resistant SSI required.  Electrolytes: Cl:Ac ratio improved and stable, sodium trending lower,   Magnesium 1.6 again (got 2 gm Mag IV x 1 10/2 and increased Mag slightly in TPN)- Will give Magnesium 4 gm IV x 1 today 10/3 Renal: SCr < 1, stable Renal function remains normal, SCr < 1, stable LFTs / TGs: LFTs WNL at baseline, ALT trending up, TG 69-->193->245, bilirubin WNL Prealbumin / albumin: 8.6 / 1.4 Intake / Output; MIVF: Continue to monitor I&Os  GI Imaging: 9/7 CT abdomen pelvis: colonic perforation related to diverticulitis with worsening fluid and gas containing abscesses is throughout the low abdomen and pelvis many with interloop and mesenteric involvement, also with multiple areas of pelvic fluid as well showing generalized inflammation tracking to the mid abdomen. Interval decompression of the abscess in the cul-de-sac following drain placement. Extensive body wall edema.  9/15 CT concerning for perihepatic fluid collection and possible abscess 9/28 CT-guided percutaneous drainage of left lower quadrant abscess containing mostly gas and also yielding some bloody fluid  Surgeries / Procedures:  9/8 Robotic assisted laparoscopic lysis of adhesions and drainage of intra-abdominal abscess  9/17 IR placement of drain in perihepatic fluid collection  9/27 IR drain of LLQ abscess 9/28 CT-guided percutaneous drainage of left lower quadrant abscess  Central access:  11/19/19 TPN start date: 11/19/19  Nutritional Goals  kCal: 2700 - 3000 kCal/day, Protein: >135 g/day, Fluid: > 2000 mL Goal TPN rate is 90 mL/hr (provides 140 g of protein and 2999 kcals per day)  Current Nutrition:  Advanced to full liquids on 9/22 then advanced to soft diet on 9/23 then back to clear liquids on 9/26, now soft diet 10/1  Plan:   Due to elevated LFTs adjust TPN to 45 mL/hr using concentrated amino acids: total volume 1180 mL  Nutritional components: 21% dextrose (226.8g), 65 g/L (70.2g) amino acids, 41.5 g/L (44.8g) lipids, 1499 kCal  Electrolytes in TPN: 158mEq/L of Na, 15 mEq/L of K, 70mEq/L of Ca, 5 mEq/L of Mg, phosphorous 15 mmol/L, continue Cl:Ac ratio at max (currently 1.7 : 1)  Sodium increased to 100 mEq/L 9/28  Magnesium increased to 7 mEq/L 10/2  Add standard MVI and trace elements to TPN  Mag 1.6 again today 10/3-will order Magnesium 4 gm IV x 1 bag and  Mag was slightly increased in TPN yesterday 10/2. F/u labs Mon am 10/4  Continue resistant SSI to q6h SSI and continue to adjust as needed - will increase from 10 to12 units insulin regular in TPN   Monitor TPN labs daily until stable then on Mon/Thurs, next in am 10/2   Latamara Melder A 12/13/2019 10:04 AM

## 2019-12-13 NOTE — Progress Notes (Signed)
PROGRESS NOTE    Amanda Reyes  ZOX:[id number] DOB: 07-02-1985 DOA: 11/09/2019 PCP: Patient, No Pcp Per   Brief Narrative: Amanda Reyes is a 33 y.o. female status post operative drainage of intra-abdominal abscesses.  No evidence of perforation or frank stool.  Postoperative course complicated by hypoxic respiratory failure requiring intubation.  Now extubated. Course continues to be complicated by recurrent fever requiring repeat drain placement and continued IV antibiotics. Currently on TPN. Significant physical debility with likely SNF discharge.   Assessment & Plan:   Active Problems:   Diverticulitis of colon with perforation   Diverticulitis of large intestine with perforation and abscess   Intra-abdominal abscesses Patient is status post laparoscopic lysis of adhesions and drainage of abscesses.  Patient also had drain placement on 9/8 upsized on 9/17 in addition to the drain placed on 9/27 for chronic fevers and LLQ abscess.  Patient was previously on ciprofloxacin and flagyl, transitioned to  meropenem and anidulafungin -> fluconazole for treatment of infection/abscess continued E. coli with evidence of few lactobacillus species and rare Candida albicans.  Fluconazole was eventually.  Discontinued. Drain culture (9/28) shows few enterococcus faecium which is sensitive to Vancomycin; patient started on Linezolid. LLQ drain continues to drain feculent appearing fluid. Leukocytosis improving. -ID recommendations: Meropenem, Linezolid  Recurrent fever Secondary to above.  Resolved. Currently is afebrile but with recurrent high grade temperatures.  Acute respiratory failure with hypoxia and hypercapnia Patient required intubation and mechanical ventilation post surgery. Weaned to room air.  Essential hypertension Better controlled on hydrochlorothiazide. Patient is on losartan-hydrochlorothiazide as an outpatient. -Continue HCTZ 25 mg daily  Anemia Likely iron deficient  combined with acute illness. Menstruating female, however hemoglobin has been normal prior to illness. No iron panel available. She has received at least 1 unit of PRBC to date but it is difficult to see if she got PRBC on 9/8. Patient was up with PT without symptoms of anemia. Currently menstruating, however her bleeding is subsiding.  Tachycardia Patient has had persistent tachycardia this admission.  In setting of infection.  Patient was also amlodipine which may have been contributing but likely very minimally.  Currently not hemodynamically unstable.  No need for tight control at this time.  Malnutrition Morbid obesity Currently on TPN secondary to above. Also with evidence of hypoalbuminemia last albumin of 1.4.  Dietitian is consulted.  Patient is currently advanced to a regular diet. -General surgery recommendations for TPN/diet  Hypomagnesemia Patient is on TPN. Management by pharmacy  Transaminitis Possibly secondary to TPN.  Currently stable.  History of cholecystectomy.  Last CT scan without evidence of biliary obstruction.  Previous fluid collection almost completely resolved on last CT (9/25). AST/ALT continue to climb. -Daily CMP  AKI Resolved.  Pre-diabetes Patient does not appear to have a diagnosis of diabetes.  Last hemoglobin A1c of 5.7%.  Complicated by significant obesity.  Patient will benefit from Metformin use on discharge as she likely has a significant amount of insulin resistance.  Patient is currently managed on sliding scale insulin while inpatient.   DVT prophylaxis: Per primary (Lovenox) Code Status:   Code Status: Full Code Family Communication: Mother at bedside Disposition Plan: Per primary, General Surgery   Antimicrobials:  Meropenem  Anidulafungin  Fluconazole    Subjective: Abdominal pain is improved. No issues this morning.  Objective: Vitals:   12/12/19 2055 12/13/19 0020 12/13/19 0642 12/13/19 0831  BP: 117/77 126/71 112/71 125/76   Pulse: (!) 113 (!) 113 (!) 109 Marland Kitchen)  106  Resp: (!) 24 (!) 24 (!) 24 16  Temp: 99.3 F (37.4 C) 99.4 F (37.4 C) 98.4 F (36.9 C) 99.3 F (37.4 C)  TempSrc: Oral Oral Oral Oral  SpO2: 95% 95% 95% 100%  Weight:      Height:        Intake/Output Summary (Last 24 hours) at 12/13/2019 0941 Last data filed at 12/13/2019 0515 Gross per 24 hour  Intake 3054.14 ml  Output 710 ml  Net 2344.14 ml   Filed Weights   12/03/19 0423 12/05/19 0438 12/07/19 0454  Weight: (!) 198 kg (!) 224.5 kg (!) 220.4 kg    Examination:  General exam: Appears calm and comfortable Respiratory system: Clear to auscultation. Respiratory effort normal. Cardiovascular system: S1 & S2 heard, RRR. No murmurs, rubs, gallops or clicks. Gastrointestinal system: Abdomen is obese, nondistended, soft and mildly tender in RUQ. Decreased bowel sounds heard. Central nervous system: Alert and oriented. No focal neurological deficits. Musculoskeletal: BLE edema. No calf tenderness Skin: No cyanosis. No rashes Psychiatry: Judgement and insight appear normal. Mood & affect appropriate.     Data Reviewed: I have personally reviewed following labs and imaging studies  CBC Lab Results  Component Value Date   WBC 9.0 12/13/2019   RBC 3.04 (L) 12/13/2019   HGB 7.4 (L) 12/13/2019   HCT 23.0 (L) 12/13/2019   MCV 75.7 (L) 12/13/2019   MCH 24.3 (L) 12/13/2019   PLT 290 12/13/2019   MCHC 32.2 12/13/2019   RDW 20.5 (H) 12/13/2019   LYMPHSABS 1.9 12/10/2019   MONOABS 1.1 (H) 12/10/2019   EOSABS 0.2 12/10/2019   BASOSABS 0.1 12/10/2019     Last metabolic panel Lab Results  Component Value Date   NA 134 (L) 12/12/2019   K 3.9 12/12/2019   CL 98 12/12/2019   CO2 28 12/12/2019   BUN 19 12/12/2019   CREATININE 0.54 12/12/2019   GLUCOSE 127 (H) 12/12/2019   GFRNONAA >60 12/12/2019   GFRAA >60 12/12/2019   CALCIUM 8.5 (L) 12/12/2019   PHOS 4.5 12/12/2019   PROT 8.1 12/10/2019   ALBUMIN 1.5 (L) 12/12/2019    BILITOT 0.4 12/10/2019   ALKPHOS 193 (H) 12/10/2019   AST 44 (H) 12/10/2019   ALT 103 (H) 12/10/2019   ANIONGAP 8 12/12/2019    CBG (last 3)  Recent Labs    12/12/19 1743 12/13/19 0004 12/13/19 0517  GLUCAP 126* 130* 129*     GFR: Estimated Creatinine Clearance: 202 mL/min (by C-G formula based on SCr of 0.54 mg/dL).  Coagulation Profile: No results for input(s): INR, PROTIME in the last 168 hours.  Recent Results (from the past 240 hour(s))  Body fluid culture     Status: None   Collection Time: 12/08/19  9:56 PM   Specimen: JP Drain; Body Fluid  Result Value Ref Range Status   Specimen Description   Final    JP DRAINAGE Performed at [facility], [address] [place], [place] [address]    Special Requests   Final    NONE Performed at [facility], [address] [place], [place] [address]    Gram Stain   Final    ABUNDANT WBC PRESENT, PREDOMINANTLY PMN RARE GRAM POSITIVE COCCI IN PAIRS Performed at [facility], [address] [place], [place] [address]    Culture FEW ESCHERICHIA COLI FEW ENTEROCOCCUS FAECIUM   Final   Report Status 12/12/2019 FINAL  Final   Organism ID, Bacteria ESCHERICHIA COLI  Final   Organism ID, Bacteria ENTEROCOCCUS FAECIUM  Final      Susceptibility   Escherichia coli - MIC*    AMPICILLIN >=32 RESISTANT Resistant     CEFAZOLIN <=4 SENSITIVE Sensitive     CEFEPIME <=0.12 SENSITIVE Sensitive     CEFTAZIDIME <=1 SENSITIVE Sensitive     CEFTRIAXONE <=0.25 SENSITIVE Sensitive     CIPROFLOXACIN >=4 RESISTANT Resistant     GENTAMICIN >=16 RESISTANT Resistant     IMIPENEM <=0.25 SENSITIVE Sensitive     TRIMETH/SULFA >=320 RESISTANT Resistant     AMPICILLIN/SULBACTAM >=32 RESISTANT Resistant     PIP/TAZO <=4 SENSITIVE Sensitive     * FEW ESCHERICHIA COLI   Enterococcus faecium - MIC*    AMPICILLIN <=2 SENSITIVE Sensitive     VANCOMYCIN <=0.5 SENSITIVE Sensitive     GENTAMICIN SYNERGY SENSITIVE Sensitive      * FEW ENTEROCOCCUS FAECIUM        Radiology Studies: No results found.      Scheduled Meds: . sodium chloride   Intravenous Once  . amLODipine  10 mg Oral Daily  . Chlorhexidine Gluconate Cloth  6 each Topical Q0600  . enoxaparin (LOVENOX) injection  40 mg Subcutaneous Q12H  . feeding supplement (ENSURE ENLIVE)  237 mL Oral TID BM  . hydrochlorothiazide  25 mg Oral Daily  . insulin aspart  0-20 Units Subcutaneous Q6H  . mouth rinse  15 mL Mouth Rinse q12n4p  . pantoprazole (PROTONIX) IV  40 mg Intravenous Q12H  . sodium chloride flush  10-40 mL Intracatheter Q12H  . sodium chloride flush  5 mL Intracatheter Q8H  . sodium chloride flush  5 mL Intracatheter Q8H   Continuous Infusions: . sodium chloride    . sodium chloride Stopped (12/12/19 0945)  . linezolid (ZYVOX) IV 600 mg (12/13/19 0827)  . magnesium sulfate bolus IVPB    . meropenem (MERREM) IV 1 g (12/13/19 0515)  . TPN ADULT (ION) 45 mL/hr at 12/13/19 0515     LOS: 34 days     Jacquelin Hawking, MD [facility] 12/13/2019, 9:41 AM  If 7PM-7AM, please contact night-coverage [email or web address]

## 2019-12-13 NOTE — Plan of Care (Signed)
Continuing with plan of care. 

## 2019-12-13 NOTE — Progress Notes (Signed)
Mobility Specialist - Progress Note   12/13/19 1500  Mobility  Range of Motion/Exercises All extremities (ankle pumps, slr, hip add/abd, crossover reach, arm raises)  Level of Assistance Standby assist, set-up cues, supervision of patient - no hands on  Assistive Device None  Distance Ambulated (ft) 0 ft  Mobility Response Tolerated well  Mobility performed by Mobility specialist  $Mobility charge 1 Mobility    Pre-mobility: 110 HR, 98% SpO2 Post-mobility: 115 HR, 97% SpO2   Pt was lying in bed upon arrival with mother present in room. Pt agreed to session. Pt stated she feels much better than she did yesterday. Pt was able to perform supine exercises: ankle pumps (15x/leg), straight leg raises, hip add/abd (10x/leg), crossover reach, arm raises (10x/arm), and forward stretch (5x/arm). Pt needed steadying assist while performing SLR. Overall, pt tolerated session well. Pt remains in bed with all needs in reach.    Filiberto Pinks Mobility Specialist 12/13/19, 3:16 PM

## 2019-12-14 DIAGNOSIS — R000 Tachycardia, unspecified: Secondary | ICD-10-CM | POA: Diagnosis not present

## 2019-12-14 DIAGNOSIS — K5720 Diverticulitis of large intestine with perforation and abscess without bleeding: Secondary | ICD-10-CM | POA: Diagnosis not present

## 2019-12-14 DIAGNOSIS — K651 Peritoneal abscess: Secondary | ICD-10-CM | POA: Diagnosis not present

## 2019-12-14 DIAGNOSIS — D649 Anemia, unspecified: Secondary | ICD-10-CM | POA: Diagnosis not present

## 2019-12-14 DIAGNOSIS — K5792 Diverticulitis of intestine, part unspecified, without perforation or abscess without bleeding: Secondary | ICD-10-CM | POA: Diagnosis not present

## 2019-12-14 DIAGNOSIS — R7303 Prediabetes: Secondary | ICD-10-CM | POA: Diagnosis not present

## 2019-12-14 DIAGNOSIS — I10 Essential (primary) hypertension: Secondary | ICD-10-CM

## 2019-12-14 DIAGNOSIS — J9602 Acute respiratory failure with hypercapnia: Secondary | ICD-10-CM

## 2019-12-14 LAB — GLUCOSE, CAPILLARY
Glucose-Capillary: 120 mg/dL — ABNORMAL HIGH (ref 70–99)
Glucose-Capillary: 136 mg/dL — ABNORMAL HIGH (ref 70–99)
Glucose-Capillary: 137 mg/dL — ABNORMAL HIGH (ref 70–99)
Glucose-Capillary: 141 mg/dL — ABNORMAL HIGH (ref 70–99)

## 2019-12-14 LAB — COMPREHENSIVE METABOLIC PANEL
ALT: 79 U/L — ABNORMAL HIGH (ref 0–44)
AST: 30 U/L (ref 15–41)
Albumin: 1.6 g/dL — ABNORMAL LOW (ref 3.5–5.0)
Alkaline Phosphatase: 144 U/L — ABNORMAL HIGH (ref 38–126)
Anion gap: 6 (ref 5–15)
BUN: 17 mg/dL (ref 6–20)
CO2: 32 mmol/L (ref 22–32)
Calcium: 8 mg/dL — ABNORMAL LOW (ref 8.9–10.3)
Chloride: 97 mmol/L — ABNORMAL LOW (ref 98–111)
Creatinine, Ser: 0.44 mg/dL (ref 0.44–1.00)
GFR calc Af Amer: >60 mL/min (ref >60)
GFR calc non Af Amer: >60 mL/min (ref >60)
Glucose, Bld: 131 mg/dL — ABNORMAL HIGH (ref 70–99)
Potassium: 4 mmol/L (ref 3.5–5.1)
Sodium: 135 mmol/L (ref 135–145)
Total Bilirubin: 0.5 mg/dL (ref 0.3–1.2)
Total Protein: 7.9 g/dL (ref 6.5–8.1)

## 2019-12-14 LAB — PREALBUMIN: Prealbumin: 15.7 mg/dL — ABNORMAL LOW (ref 18–38)

## 2019-12-14 LAB — DIFFERENTIAL
Abs Immature Granulocytes: 0.16 10*3/uL — ABNORMAL HIGH (ref 0.00–0.07)
Basophils Absolute: 0 10*3/uL (ref 0.0–0.1)
Basophils Relative: 0 %
Eosinophils Absolute: 0.3 10*3/uL (ref 0.0–0.5)
Eosinophils Relative: 3 %
Immature Granulocytes: 2 %
Lymphocytes Relative: 21 %
Lymphs Abs: 1.8 10*3/uL (ref 0.7–4.0)
Monocytes Absolute: 0.5 10*3/uL (ref 0.1–1.0)
Monocytes Relative: 6 %
Neutro Abs: 6 10*3/uL (ref 1.7–7.7)
Neutrophils Relative %: 68 %

## 2019-12-14 LAB — CBC
HCT: 23.9 % — ABNORMAL LOW (ref 36.0–46.0)
Hemoglobin: 7.4 g/dL — ABNORMAL LOW (ref 12.0–15.0)
MCH: 23.9 pg — ABNORMAL LOW (ref 26.0–34.0)
MCHC: 31 g/dL (ref 30.0–36.0)
MCV: 77.3 fL — ABNORMAL LOW (ref 80.0–100.0)
Platelets: 320 10*3/uL (ref 150–400)
RBC: 3.09 MIL/uL — ABNORMAL LOW (ref 3.87–5.11)
RDW: 20.7 % — ABNORMAL HIGH (ref 11.5–15.5)
WBC: 8.8 10*3/uL (ref 4.0–10.5)
nRBC: 0 % (ref 0.0–0.2)

## 2019-12-14 LAB — PHOSPHORUS: Phosphorus: 4.6 mg/dL (ref 2.5–4.6)

## 2019-12-14 LAB — TRIGLYCERIDES: Triglycerides: 244 mg/dL — ABNORMAL HIGH (ref <150)

## 2019-12-14 LAB — MAGNESIUM: Magnesium: 1.6 mg/dL — ABNORMAL LOW (ref 1.7–2.4)

## 2019-12-14 MED ORDER — MAGNESIUM SULFATE 4 GM/100ML IV SOLN
4.0000 g | Freq: Once | INTRAVENOUS | Status: AC
  Administered 2019-12-14: 4 g via INTRAVENOUS
  Filled 2019-12-14: qty 100

## 2019-12-14 MED ORDER — TRAMADOL HCL 50 MG PO TABS
50.0000 mg | ORAL_TABLET | ORAL | Status: DC | PRN
  Administered 2019-12-14 – 2019-12-18 (×9): 50 mg via ORAL
  Filled 2019-12-14 (×9): qty 1

## 2019-12-14 MED ORDER — TRACE MINERALS CU-MN-SE-ZN 300-55-60-3000 MCG/ML IV SOLN
INTRAVENOUS | Status: AC
  Filled 2019-12-14: qty 468

## 2019-12-14 MED ORDER — SODIUM CHLORIDE 0.9 % IV SOLN
2.0000 g | INTRAVENOUS | Status: AC
  Administered 2019-12-15 – 2019-12-18 (×4): 2 g via INTRAVENOUS
  Filled 2019-12-14: qty 20
  Filled 2019-12-14 (×2): qty 2
  Filled 2019-12-14: qty 20

## 2019-12-14 NOTE — Progress Notes (Signed)
°   12/14/19 2000  Assess: MEWS Score  Level of Consciousness Alert  Assess: MEWS Score  MEWS Temp 0  MEWS Systolic 0  MEWS Pulse 2  MEWS RR 0  MEWS LOC 0  MEWS Score 2  MEWS Score Color Yellow  Assess: if the MEWS score is Yellow or Red  Were vital signs taken at a resting state? Yes  Focused Assessment No change from prior assessment  Early Detection of Sepsis Score *See Row Information* Medium  MEWS guidelines implemented *See Row Information* No, previously yellow, continue vital signs every 4 hours  Treat  Pain Scale 0-10  Pain Score 9  Pain Type Chronic pain;Surgical pain  Pain Location Abdomen  Pain Orientation Right;Lower  Pain Descriptors / Indicators Constant;Shooting;Sharp  Pain Intervention(s) Medication (See eMAR)  Complains of Nausea /  Vomiting  Continue to monitor, assess pain

## 2019-12-14 NOTE — Consult Note (Signed)
PHARMACY - TOTAL PARENTERAL NUTRITION CONSULT NOTE   Indication: Prolonged ileus  Patient Measurements: Height: 5\' 9"  (175.3 cm) Weight: (!) 220.4 kg (486 lb) IBW/kg (Calculated) : 66.2 TPN AdjBW (KG): 90.5 Body mass index is 71.77 kg/m.  Assessment: 34 year old female with PMHx of HTN admitted with concern for perforated diverticulitis with contained abscesses. Patient is POD # 26 from laparoscopic lysis of adhesions and drainage of intra-abdominal abscess.   Glucose / Insulin: A1C 5.7, BG range 119 - 131: 12 units SSI required Electrolytes: Cl 97, Co2 32. Sodium at lower limit of normal. Magnesium 1.6 (un-changed) despite replacement with magnesium sulfate 4 g IV x 1 on 10/3. Phosphorous at ULN.  Renal: SCr < 0.8, stable. Patient is on HCTZ 25 mg daily. LFTs / TGs: LFTs WNL at baseline, trending down, TG stable in 200s, bilirubin WNL Prealbumin / albumin: 8.6 / 1.6 Intake / Output; MIVF: Continue to monitor I&Os. Per chart patient is net + 4.2L last two days. GI Imaging: 9/7 CT abdomen pelvis: colonic perforation related to diverticulitis with worsening fluid and gas containing abscesses is throughout the low abdomen and pelvis many with interloop and mesenteric involvement, also with multiple areas of pelvic fluid as well showing generalized inflammation tracking to the mid abdomen. Interval decompression of the abscess in the cul-de-sac following drain placement. Extensive body wall edema.  9/15 CT concerning for perihepatic fluid collection and possible abscess 9/28 CT-guided percutaneous drainage of left lower quadrant abscess containing mostly gas and also yielding some bloody fluid  Surgeries / Procedures:  9/8 Robotic assisted laparoscopic lysis of adhesions and drainage of intra-abdominal abscess  9/17 IR placement of drain in perihepatic fluid collection  9/27 IR drain of LLQ abscess 9/28 CT-guided percutaneous drainage of left lower quadrant abscess  Central access:  11/19/19 TPN start date: 11/19/19  Nutritional Goals  kCal: 2700 - 3000 kCal/day, Protein: >135 g/day, Fluid: > 2000 mL Goal TPN rate is 90 mL/hr (provides 140 g of protein and 2999 kcals per day)  Current Nutrition:  Soft/regular diet + nutritional supplements. Per surgical note 10/4 plan to discontinue TPN in 24-48 hours depending on PO intake  Plan:   Continue TPN at 45 mL/hr using concentrated amino acids: total volume 1180 mL  Nutritional components: 21% dextrose (226.8g), 65 g/L (70.2g) amino acids, 41.5 g/L (44.8g) lipids, 1499 kCal  Electrolytes in TPN: 165mEq/L of Na, 15 mEq/L of K, 17mEq/L of Ca, 15 mEq/L of Mg, phosphorous 15 mmol/L, continue Cl:Ac ratio at max (currently 1.7 : 1)  Sodium increased to 100 mEq/L 9/28  Magnesium increased to 15 mEq/L 10/4  Add standard MVI and trace elements to TPN  Mag 1.6- magnesium sulfate 4 gm IV x 1 and increase magnesium in TPN 10/4  Continue rSSI q6h + 12u regular insulin in TPN and continue to adjust as needed  Monitor TPN labs on Mon/Thurs  BMP 10/5   10/28 12/14/2019 9:37 AM

## 2019-12-14 NOTE — Progress Notes (Signed)
[facility] SURGICAL PROGRESS NOTE  Hospital Day(s): 35.   Post op day(s): 26 Days Post-Op.   Interval History:  Patient seen and examined no acute events or new complaints overnight.  Patient reports she is doing well, intermittent pains mostly at drain sites No nausea, emesis, fever, chills WBC remains normal now x72 hours Hgb stable low at 7.4, refuses transfusion "unless necessary" Renal function remains normal, sCr - 0.44, foley removed overnight, UO - 650 ccs + unmeasured Mild hypomagnesemia to 1.6 o/w no significant electrolyte derangements Drain output as follows:  - RLQ:10ccs,this appears more purulent - LLQ:90 ccs,appears more feculent She continues on Meropenem (Day 25) and Linezolid added on 10/01 (Day 3); ID following Worked with therapies on 10/01; recommendations are SNF; she reports she has been getting to edge of bed and working on getting OOB  Vital signs in last 24 hours: [min-max] current  Temp:  [97.5 F (36.4 C)-99.3 F (37.4 C)] 97.8 F (36.6 C) (10/04 0419) Pulse Rate:  [102-106] 102 (10/04 0419) Resp:  [16-20] 20 (10/04 0419) BP: (120-139)/(68-97) 138/88 (10/04 0419) SpO2:  [93 %-100 %] 98 % (10/04 0419)     Height: 5\' 9"  (175.3 cm) Weight: (!) 220.4 kg BMI (Calculated): 71.74   Intake/Output last 2 shifts:  10/03 0701 - 10/04 0700 In: 2685.5 [P.O.:480; I.V.:1105.4; IV Piggyback:1100.1] Out: 750 [Urine:650; Drains:100]   Physical Exam:  Constitutional: alert, cooperative and no distress Respiratory:Breathing appears to be improving, less tachypneic, still with some intermittent conversational dyspnea but again this has been her baseline herein the hospital Cardiovascular:Tachycardicand sinus rhythm  Gastrointestinal:Obese, soft,she appears to be tender in right mid-abdomen but this is difficult to pinpoint given body habitus, non-distended. Rectal tube in place. RLQ drain with seropurulent output,  LLQ drain with feculent output.  Musculoskeletal: 1+ edema to bilateral LE Integumentary:Laparoscopic incisions are well healed  Labs:  CBC Latest Ref Rng & Units 12/14/2019 12/13/2019 12/12/2019  WBC 4.0 - 10.5 K/uL 8.8 9.0 9.3  Hemoglobin 12.0 - 15.0 g/dL 7.4(L) 7.4(L) 7.3(L)  Hematocrit 36 - 46 % 23.9(L) 23.0(L) 23.5(L)  Platelets 150 - 400 K/uL 320 290 294   CMP Latest Ref Rng & Units 12/14/2019 12/12/2019 12/11/2019  Glucose 70 - 99 mg/dL 02/10/2020) 703(J) 009(F)  BUN 6 - 20 mg/dL 17 19 818(E)  Creatinine 0.44 - 1.00 mg/dL 99(B 7.16 9.67  Sodium 135 - 145 mmol/L 135 134(L) 133(L)  Potassium 3.5 - 5.1 mmol/L 4.0 3.9 4.1  Chloride 98 - 111 mmol/L 97(L) 98 98  CO2 22 - 32 mmol/L 32 28 27  Calcium 8.9 - 10.3 mg/dL 8.0(L) 8.5(L) 8.1(L)  Total Protein 6.5 - 8.1 g/dL 7.9 - -  Total Bilirubin 0.3 - 1.2 mg/dL 0.5 - -  Alkaline Phos 38 - 126 U/L 144(H) - -  AST 15 - 41 U/L 30 - -  ALT 0 - 44 U/L 79(H) - -     Imaging studies: No new pertinent imaging studies   Assessment/Plan:  34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 26 Days Post-Op s/p robotic assisted laparoscopic lysis of adhesion and drainage of intra-abdominal abscess WITHOUT evidence of perforation or frank stoolforperforated diverticulitis, complicated by hypoxic respiratory failure requiring post-operative intubation(since extubated)   - Okay to continue soft/regular diet + nutritional supplementation    - Wean from TPN pending PO intake; if she is having adequate PO intake, I am okay with removing TPN in next 24-48 hours  - Continue IV ABx;  switched to Meropenem(Day 25) + Linezolid (Day 3); d/w ID on 10/01 and if without fever and leukocytosis improving we will holiday from ABx   - Continue RLQ/LLQ drains; monitor and record output - No plans for surgical re-intervention at this time - Monitor abdominal examination; on-going bowel function - Pain control  prn; antiemetics prn -Monitor H&H; s/p transfusion 1 unit pRBC on 09/21 --> only re-transfuse if "absolutely necessary"   - Monitor leukocytosis, renal function  - Consider rectal tube removal today/tomorrow if patient can mobilize to commode --> encouraged practicing transitioning to commode today  - Home medications; continue; appreciate hospitalist consultation -Mobilization encouraged as tolerated; PT/OT following; recommending SNF - DVT prophylaxis   - Discharge Planning; Need to completely wean from TPN, remove rectal tube, ensure leukocytosis remains resolved, continue working with therapies...Marland Kitchenhopefully ready for DC sometime this week to SNF. No rush nor guarantees in this complex case.   All of the above findings and recommendations were discussed with the patient, and the medical team, and all of patient's questions were answered to her expressed satisfaction.  -- Lynden Oxford, PA-C [facility] 12/14/2019, 7:31 AM [phone] M-F: 7am - 4pm

## 2019-12-14 NOTE — Progress Notes (Signed)
Physical Therapy Treatment Patient Details Name: Amanda Reyes MRN: [id number] DOB: 03-Jul-1985 Today's Date: 12/14/2019    History of Present Illness Pt is 34 year old female with past medical history for morbid obesity and hypertension, admitted to ICU due to acute diverticulitis and microperforation.  Patient is s/p of robotic surgery for lysis of adhesions.    PT Comments    Pt was pleasant and motivated to participate during the session. Pt was able to perform all bed level therex with no significant increased fatigue or pain. Pt demonstrated greater trunk mobility and strength than in previous sessions. Pt was able to move legs towards EOB and showed greater trunk mobility for bed mobility but still requires Max +3 to come from supine to sitting. Pt sat EOB for tolerance to upright posistion and was able to maintain sitting balance. Pt noted dizziness and fatigue in this position. Pt was able to perform STS x2 with Max +3 and was able come to fully standing. Pt was not able to move their feet in this position and needed to sit secondary to fatigue and dizziness in less than 1 minute. Pt was Max +3 for Sit to Supine. Pt will benefit from PT services in a SNF setting upon discharge to safely address deficits listed in patient problem list for decreased caregiver assistance and eventual return to PLOF.    Follow Up Recommendations  SNF     Equipment Recommendations  Other (comment) (TBD at next level of care)    Recommendations for Other Services       Precautions / Restrictions Precautions Precautions: Fall Restrictions Weight Bearing Restrictions: No    Mobility  Bed Mobility Overal bed mobility: Needs Assistance Bed Mobility: Rolling Rolling: Max assist (+ 3 for physical assistance and safety) Sidelying to sit: Max assist (+ 3 for physical assistance and safety)   Sit to supine: Max assist (+ 3 for physical assistance and safety) Sit to sidelying: Max assist (+ 3 for  physical assistance and safety) General bed mobility comments: pt required significant effort to come to sitting  Transfers Overall transfer level: Needs assistance Equipment used: Rolling walker (2 wheeled) Transfers: Sit to/from Stand Sit to Stand: Max assist         General transfer comment: pt able to perform x 2 with Max A and significant effort. Pt able to come up to full trunk extension and reports dizziness  Ambulation/Gait             General Gait Details: unsafe to attempt   Stairs             Wheelchair Mobility    Modified Rankin (Stroke Patients Only)       Balance Overall balance assessment: Needs assistance Sitting-balance support: Feet unsupported;Bilateral upper extremity supported Sitting balance-Leahy Scale: Fair Sitting balance - Comments: pt able to maintain balance sitting EOB with feet unsupported and bilateral UE support. able to tolerate some small balance challenges   Standing balance support: Bilateral upper extremity supported Standing balance-Leahy Scale: Fair Standing balance comment: able to maintain posistion with no PT support                            Cognition Arousal/Alertness: Awake/alert Behavior During Therapy: Flat affect;WFL for tasks assessed/performed Overall Cognitive Status: Within Functional Limits for tasks assessed  Exercises Total Joint Exercises Ankle Circles/Pumps: AROM;Both;15 reps Straight Leg Raises: AROM;Both;10 reps Other Exercises Other Exercises: pt sat EOB for tolerence for 5 minutes Other Exercises: pt performed crossbody reach to L x10 and R x10    General Comments General comments (skin integrity, edema, etc.): pt reports significant fatigue and dizziness with standing      Pertinent Vitals/Pain Pain Assessment: No/denies pain    Home Living                      Prior Function            PT Goals  (current goals can now be found in the care plan section) Acute Rehab PT Goals Patient Stated Goal: Go home and be able to swim again PT Goal Formulation: With patient Time For Goal Achievement: 12/08/19 Potential to Achieve Goals: Fair Progress towards PT goals: Progressing toward goals    Frequency    Min 2X/week      PT Plan Current plan remains appropriate    Co-evaluation              AM-PAC PT "6 Clicks" Mobility   Outcome Measure  Help needed turning from your back to your side while in a flat bed without using bedrails?: A Lot Help needed moving from lying on your back to sitting on the side of a flat bed without using bedrails?: Total Help needed moving to and from a bed to a chair (including a wheelchair)?: Total Help needed standing up from a chair using your arms (e.g., wheelchair or bedside chair)?: A Lot Help needed to walk in hospital room?: Total Help needed climbing 3-5 steps with a railing? : Total 6 Click Score: 8    End of Session Equipment Utilized During Treatment: Gait belt Activity Tolerance: Patient tolerated treatment well Patient left: in bed;with call bell/phone within reach;with nursing/sitter in room;with family/visitor present  Nurse Communication: Mobility status;Other (comment) (nursing notified that pr needed new purewick) PT Visit Diagnosis: Repeated falls (R29.6);Muscle weakness (generalized) (M62.81);Difficulty in walking, not elsewhere classified (R26.2)     Time: 7564-3329 PT Time Calculation (min) (ACUTE ONLY): 46 min  Charges:                        Nicolette Bang, SPT 12/14/19. 11:45 AM

## 2019-12-14 NOTE — Progress Notes (Signed)
Nutrition Follow-up  DOCUMENTATION CODES:   Morbid obesity  INTERVENTION:   TPN per pharmacy  Ensure Enlive po TID, each supplement provides 350 kcal and 20 grams of protein  NUTRITION DIAGNOSIS:   Inadequate oral intake related to inability to eat as evidenced by NPO status. -resolved   GOAL:   Patient will meet greater than or equal to 90% of their needs -met with TPN- progressing with oral intake   MONITOR:   PO intake, Supplement acceptance, Labs, Weight trends, Skin, I & O's, TPN  ASSESSMENT:   34 year old female with PMHx of HTN admitted with perforated diverticulitis with contained abscesses.  9/8 s/p robotic lysis of adhesions with irrigation and debridement of multiple intra-abdominal/peritoneal/pelvis abscesses with drain placement  Pt continues to tolerate TPN, now weaning. Refeed labs stable. Pt initiated on soft diet 10/1 with Ensure supplements added; pt is documented to be eating 40-100% of meals and is drinking some Ensure. Pt ate 90% of her breakfast this morning which included scrambled eggs, sausage and a bowl of cereal. Drains with 146m output. RLQ drain with seropurulent output, LLQ drain with feculent output per MD note. Last documented weight on 9/27. UOP 6533m Edema improved. Pt is mobilizing better. At this point would recommend discontinuing TPN as pt with improved appetite and oral intake. At this point pt could have enteral feeds if her oral intake declines. Plan is for SNF at discharge.    Medications reviewed and include: lovenox, insulin, protonix, Mg sulfate, meropenem  Labs reviewed: K 4.0 wnl, P 4.6 wnl, Mg 1.6(L) Triglycerides 244(H)- 10/4 Pre-albumin- 15.7(L) Hgb 7.4(L), Hct 23.9(L) cbgs- 136, 120 x 24 hrs  Diet Order:   Diet Order            DIET SOFT Room service appropriate? Yes; Fluid consistency: Thin  Diet effective now                EDUCATION NEEDS:   No education needs have been identified at this time  Skin:  Skin  Assessment: Skin Integrity Issues: (closed incisions to abdomen and vagina)  Last BM:  10/1- type 7  Height:   Ht Readings from Last 1 Encounters:  11/09/19 '5\' 9"'  (1.753 m)   Weight:   Wt Readings from Last 1 Encounters:  12/07/19 (!) 220.4 kg   Ideal Body Weight:  65.9 kg  BMI:  Body mass index is 71.77 kg/m.  Estimated Nutritional Needs:   Kcal:  2700-3000kcal/day  Protein:  >135g/day  Fluid:  >/= 2 L/day  CaKoleen DistanceS, RD, LDN Please refer to [facility] and/or RD on-call/weekend/after hours pager

## 2019-12-14 NOTE — Progress Notes (Signed)
Pt had a large incontinent void; indwelling catheter is leaking.  On call provider notified; ordered to remove foley.   Foley catheter removed and pt placed on purewick.

## 2019-12-14 NOTE — Progress Notes (Signed)
ID Date of Admission:11/09/2019  11/18/19 laparotomy Antibiotic Meropenem 9/9>10/4 anidulaungin 9/9 >9/17 Fluconazole 11/27/19>>12/07/19 Linezolid 12/11/19> Ceftriaxone 10/5  Culture8/30 BC -NG 9/3 intraabdominal abscess culture e.coli,  Streptococcus group C 11/27/19 -RT Upper quadrant abscess culture- lactobacillus and candida albicans 9/29 JP drain fluid- efeacium, e.coli  Foley -11/18/19>> PICC Rt arm 11/19/19>> Rt upper quadrant drain -11/27/19>> Rt LQ drain 11/18/19>> Left LQ drain 12/07/19>> Rectal tube  Pt participated on PT today and says she sat on the EOB No fever Easily fatigued Patient Vitals for the past 24 hrs:  BP Temp Temp src Pulse Resp SpO2  12/14/19 1137 128/68 97.7 F (36.5 C) Oral (!) 115 18 98 %  12/14/19 0838 126/78 98.5 F (36.9 C) Oral (!) 104 18 100 %  12/14/19 0419 138/88 97.8 F (36.6 C) Oral (!) 102 20 98 %  12/14/19 0016 124/86 99 F (37.2 C) -- (!) 106 -- 97 %  12/13/19 2031 122/68 98.9 F (37.2 C) Oral (!) 106 20 97 %   In bed Chest b/l air entry'decreased bases 3 drains- rt UQ, LTLQ and RT LQ Hss1s2 tachycardia  Abd- obese- wall edema  CNS non focal   Labs CBC Latest Ref Rng & Units 12/14/2019 12/13/2019 12/12/2019  WBC 4.0 - 10.5 K/uL 8.8 9.0 9.3  Hemoglobin 12.0 - 15.0 g/dL 7.4(L) 7.4(L) 7.3(L)  Hematocrit 36 - 46 % 23.9(L) 23.0(L) 23.5(L)  Platelets 150 - 400 K/uL 320 290 294   CMP Latest Ref Rng & Units 12/14/2019 12/12/2019 12/11/2019  Glucose 70 - 99 mg/dL 409(B) 353(G) 992(E)  BUN 6 - 20 mg/dL 17 19 26(S)  Creatinine 0.44 - 1.00 mg/dL 3.41 9.62 2.29  Sodium 135 - 145 mmol/L 135 134(L) 133(L)  Potassium 3.5 - 5.1 mmol/L 4.0 3.9 4.1  Chloride 98 - 111 mmol/L 97(L) 98 98  CO2 22 - 32 mmol/L 32 28 27  Calcium 8.9 - 10.3 mg/dL 8.0(L) 8.5(L) 8.1(L)  Total Protein 6.5 - 8.1 g/dL 7.9 - -  Total Bilirubin 0.3 - 1.2 mg/dL 0.5 - -  Alkaline Phos 38 - 126 U/L 144(H) - -  AST 15 - 41 U/L 30 - -  ALT 0 - 44 U/L 79(H) - -     Impression/recommendation Perforated diverticulitis with extensive intra-abdominal loop abscesses- admitted 11/09/19 Status post laparoscopy and washout.on 11/18/19 CT abdomen done9/16shows an abscess along rtlateral margin liverand organizing abscesses central abdominal mesentry - . IRplaced another drain9/17.Culturelactobacillus and rare candida albicans -on meropenem  CT abdomenwas donea new drain placed on 12/07/19 repeat culture enterococcus faecium and e.coli  added linezolid on 10/1 ( but not VRE) It is not going to be possible to treat the stool organisms which are likely  leaking intrabdominally thru  Perforation/fistula. This is causing ongoing  low level cytokine response inspite of antibiotic.   she has developed MDRO . Tight source control is the key As WBC nromalized with linezolid which was started on 10/1 continue until 12/18/19 Will DC meropenem and give ceftriaxone until 10/8 and then stop both antibiotics and observe  Chronic foley since 9/8- recommend purewick trial as she has no obstructive uropathy  Anemia-Hb 7.3- she may benefit from PRBC especially with her BMI, she has been persistently tachycardic and unable to work with PT for long because of tachycardia and dizziness Discussed with hospitalist regarding need for PRBC  . Pt would like to defer unless absolutely needed   Morbid obesity   She needs to participate in PT, do incentive spirometry, sit up  in bed/chair Discussed with patient and her mother

## 2019-12-14 NOTE — Progress Notes (Signed)
Mobility Specialist - Progress Note   12/14/19 1648  Mobility  Activity  (bed exercises)  Level of Assistance Standby assist, set-up cues, supervision of patient - no hands on  Mobility Response Tolerated fair  Mobility performed by Mobility specialist  $Mobility charge 1 Mobility    Pt laying in bed w/ mom present in room upon arrival. Pt, initally, agreed to session. Pt shares she is having 8/10 abdominal pain , but will try to continue session. Pt able to perform straight arm raises x10, before stating her abdominal pain is getting worst. States pain is mainly on right side of abdomen. Further mobility limited d/t pain. Pt encouraged to participate in bed exercises later today and again b/w sessions. Pt shows understanding. Pt remained in bed w/ mom present in room. All needs placed in reach. Nurse was notified.      Jillene Wehrenberg Mobility Specialist  12/14/19, 4:54 PM

## 2019-12-14 NOTE — Progress Notes (Addendum)
PROGRESS NOTE    Amanda Reyes  TGY:[id number] DOB: [birth year] DOA: 11/09/2019 PCP: Patient, No Pcp Per   Brief Narrative: Amanda Reyes is a 34 y.o. female status post operative drainage of intra-abdominal abscesses.  No evidence of perforation or frank stool.  Postoperative course complicated by hypoxic respiratory failure requiring intubation.  Now extubated. Course continues to be complicated by recurrent fever requiring repeat drain placement and continued IV antibiotics. Currently on TPN. Significant physical debility with likely SNF discharge.   Assessment & Plan:   Active Problems:   Diverticulitis of colon with perforation   Diverticulitis of large intestine with perforation and abscess   Intra-abdominal abscesses Patient is status post laparoscopic lysis of adhesions and drainage of abscesses.  Patient also had drain placement on 9/8 upsized on 9/17 in addition to the drain placed on 9/27 for chronic fevers and LLQ abscess.  Patient was previously on ciprofloxacin and flagyl, transitioned to  meropenem and anidulafungin -> fluconazole for treatment of infection/abscess continued E. coli with evidence of few lactobacillus species and rare Candida albicans.  Fluconazole was eventually.  Discontinued. Drain culture (9/28) shows few enterococcus faecium which is sensitive to Vancomycin; patient started on Linezolid. LLQ drain continues to drain feculent appearing fluid. Leukocytosis improving. -ID recommendations: Meropenem, Linezolid  Recurrent fever Secondary to above.  Resolved. Currently is afebrile but with recurrent high grade temperatures.  Acute respiratory failure with hypoxia and hypercapnia Patient required intubation and mechanical ventilation post surgery. Weaned to room air.  Essential hypertension Better controlled on hydrochlorothiazide. Patient is on losartan-hydrochlorothiazide as an outpatient. -Continue HCTZ 25 mg daily  Anemia Likely iron deficient  combined with acute illness. Menstruating female, however hemoglobin has been normal prior to illness. No iron panel available. She has received at least 1 unit of PRBC to date but it is difficult to see if she got PRBC on 9/8. Patient was up with PT without symptoms of anemia. Currently menstruating, however her bleeding is subsiding.  Tachycardia Patient has had persistent tachycardia this admission.  In setting of infection.  Patient was also amlodipine which may have been contributing but likely very minimally.  Currently not hemodynamically unstable.  No need for tight control at this time.  Malnutrition Morbid obesity Currently on TPN secondary to above. Also with evidence of hypoalbuminemia last albumin of 1.6.  Dietitian is consulted.  Patient is currently advanced to a regular diet. -General surgery recommendations for TPN/diet  Hypomagnesemia Patient is on TPN. Management by pharmacy  Transaminitis Possibly secondary to TPN.  Currently stable.  History of cholecystectomy.  Last CT scan without evidence of biliary obstruction.  Previous hepatic fluid collection almost completely resolved on last CT (9/25). AST/ALT trended down -Daily CMP  AKI Resolved.  Pre-diabetes Patient does not appear to have a diagnosis of diabetes.  Last hemoglobin A1c of 5.7%.  Complicated by significant obesity.  Patient will benefit from Metformin use on discharge as she likely has a significant amount of insulin resistance.  Patient is currently managed on sliding scale insulin while inpatient. -Continue SSI   DVT prophylaxis: Per primary (Lovenox) Code Status:   Code Status: Full Code Family Communication: None at bedside Disposition Plan: Per primary, General Surgery   Antimicrobials:  Meropenem  Anidulafungin  Fluconazole    Subjective: No significant issues this morning. Last night had to have foley catheter removed secondary to discomfort.  Objective: Vitals:   12/13/19 1701  12/13/19 2031 12/14/19 0016 12/14/19 0419  BP: 120/72 122/68 124/86  138/88  Pulse: (!) 103 (!) 106 (!) 106 (!) 102  Resp: 16 20  20   Temp: (!) 97.5 F (36.4 C) 98.9 F (37.2 C) 99 F (37.2 C) 97.8 F (36.6 C)  TempSrc:  Oral  Oral  SpO2: 99% 97% 97% 98%  Weight:      Height:        Intake/Output Summary (Last 24 hours) at 12/14/2019 0823 Last data filed at 12/14/2019 0600 Gross per 24 hour  Intake 2685.51 ml  Output 750 ml  Net 1935.51 ml   Filed Weights   12/03/19 0423 12/05/19 0438 12/07/19 0454  Weight: (!) 198 kg (!) 224.5 kg (!) 220.4 kg    Examination:  General exam: Appears calm and comfortable Respiratory system: Clear to auscultation. Respiratory effort normal. Cardiovascular system: S1 & S2 heard, RRR. No murmurs, rubs, gallops or clicks. Gastrointestinal system: Abdomen is n obese, soft and tender in RUQ. No organomegaly or masses felt. Decreased bowel sounds heard. Central nervous system: Alert and oriented. No focal neurological deficits. Musculoskeletal: BLE edema. No calf tenderness Skin: No cyanosis. No rashes Psychiatry: Judgement and insight appear normal. Mood & affect appropriate.      Data Reviewed: I have personally reviewed following labs and imaging studies  CBC Lab Results  Component Value Date   WBC 8.8 12/14/2019   RBC 3.09 (L) 12/14/2019   HGB 7.4 (L) 12/14/2019   HCT 23.9 (L) 12/14/2019   MCV 77.3 (L) 12/14/2019   MCH 23.9 (L) 12/14/2019   PLT 320 12/14/2019   MCHC 31.0 12/14/2019   RDW 20.7 (H) 12/14/2019   LYMPHSABS 1.8 12/14/2019   MONOABS 0.5 12/14/2019   EOSABS 0.3 12/14/2019   BASOSABS 0.0 12/14/2019     Last metabolic panel Lab Results  Component Value Date   NA 135 12/14/2019   K 4.0 12/14/2019   CL 97 (L) 12/14/2019   CO2 32 12/14/2019   BUN 17 12/14/2019   CREATININE 0.44 12/14/2019   GLUCOSE 131 (H) 12/14/2019   GFRNONAA >60 12/14/2019   GFRAA >60 12/14/2019   CALCIUM 8.0 (L) 12/14/2019   PHOS 4.6  12/14/2019   PROT 7.9 12/14/2019   ALBUMIN 1.6 (L) 12/14/2019   BILITOT 0.5 12/14/2019   ALKPHOS 144 (H) 12/14/2019   AST 30 12/14/2019   ALT 79 (H) 12/14/2019   ANIONGAP 6 12/14/2019    CBG (last 3)  Recent Labs    12/13/19 1706 12/14/19 0018 12/14/19 0455  GLUCAP 119* 136* 120*     GFR: Estimated Creatinine Clearance: 202 mL/min (by C-G formula based on SCr of 0.44 mg/dL).  Coagulation Profile: No results for input(s): INR, PROTIME in the last 168 hours.  Recent Results (from the past 240 hour(s))  Body fluid culture     Status: None   Collection Time: 12/08/19  9:56 PM   Specimen: JP Drain; Body Fluid  Result Value Ref Range Status   Specimen Description   Final    JP DRAINAGE Performed at [facility], [address] [place], [place]    Special Requests   Final    NONE Performed at [facility], [address] [place], [place]    Gram Stain   Final    ABUNDANT WBC PRESENT, PREDOMINANTLY PMN RARE GRAM POSITIVE COCCI IN PAIRS Performed at [facility], [address], [place], [place]    Culture FEW ESCHERICHIA COLI FEW ENTEROCOCCUS FAECIUM   Final   Report Status 12/12/2019  FINAL  Final   Organism ID, Bacteria ESCHERICHIA COLI  Final   Organism ID, Bacteria ENTEROCOCCUS FAECIUM  Final      Susceptibility   Escherichia coli - MIC*    AMPICILLIN >=32 RESISTANT Resistant     CEFAZOLIN <=4 SENSITIVE Sensitive     CEFEPIME <=0.12 SENSITIVE Sensitive     CEFTAZIDIME <=1 SENSITIVE Sensitive     CEFTRIAXONE <=0.25 SENSITIVE Sensitive     CIPROFLOXACIN >=4 RESISTANT Resistant     GENTAMICIN >=16 RESISTANT Resistant     IMIPENEM <=0.25 SENSITIVE Sensitive     TRIMETH/SULFA >=320 RESISTANT Resistant     AMPICILLIN/SULBACTAM >=32 RESISTANT Resistant     PIP/TAZO <=4 SENSITIVE Sensitive     * FEW ESCHERICHIA COLI   Enterococcus faecium - MIC*    AMPICILLIN <=2 SENSITIVE Sensitive     VANCOMYCIN <=0.5  SENSITIVE Sensitive     GENTAMICIN SYNERGY SENSITIVE Sensitive     * FEW ENTEROCOCCUS FAECIUM        Radiology Studies: No results found.      Scheduled Meds: . sodium chloride   Intravenous Once  . amLODipine  10 mg Oral Daily  . Chlorhexidine Gluconate Cloth  6 each Topical Q0600  . enoxaparin (LOVENOX) injection  40 mg Subcutaneous Q12H  . feeding supplement (ENSURE ENLIVE)  237 mL Oral TID BM  . hydrochlorothiazide  25 mg Oral Daily  . insulin aspart  0-20 Units Subcutaneous Q6H  . mouth rinse  15 mL Mouth Rinse q12n4p  . pantoprazole (PROTONIX) IV  40 mg Intravenous Q12H  . sodium chloride flush  10-40 mL Intracatheter Q12H  . sodium chloride flush  5 mL Intracatheter Q8H  . sodium chloride flush  5 mL Intracatheter Q8H   Continuous Infusions: . sodium chloride    . sodium chloride Stopped (12/12/19 0945)  . linezolid (ZYVOX) IV Stopped (12/13/19 2328)  . meropenem (MERREM) IV Stopped (12/14/19 0541)  . TPN ADULT (ION) 45 mL/hr at 12/14/19 0600     LOS: 35 days     Jacquelin Hawking, MD [facility] 12/14/2019, 8:23 AM  If 7PM-7AM, please contact night-coverage [email or web address]

## 2019-12-15 DIAGNOSIS — K651 Peritoneal abscess: Secondary | ICD-10-CM | POA: Diagnosis not present

## 2019-12-15 DIAGNOSIS — R000 Tachycardia, unspecified: Secondary | ICD-10-CM | POA: Diagnosis not present

## 2019-12-15 DIAGNOSIS — K5792 Diverticulitis of intestine, part unspecified, without perforation or abscess without bleeding: Secondary | ICD-10-CM | POA: Diagnosis not present

## 2019-12-15 DIAGNOSIS — D649 Anemia, unspecified: Secondary | ICD-10-CM | POA: Diagnosis not present

## 2019-12-15 DIAGNOSIS — R7303 Prediabetes: Secondary | ICD-10-CM | POA: Diagnosis not present

## 2019-12-15 DIAGNOSIS — K5720 Diverticulitis of large intestine with perforation and abscess without bleeding: Secondary | ICD-10-CM | POA: Diagnosis not present

## 2019-12-15 LAB — CBC
HCT: 24.3 % — ABNORMAL LOW (ref 36.0–46.0)
Hemoglobin: 7.3 g/dL — ABNORMAL LOW (ref 12.0–15.0)
MCH: 23.7 pg — ABNORMAL LOW (ref 26.0–34.0)
MCHC: 30 g/dL (ref 30.0–36.0)
MCV: 78.9 fL — ABNORMAL LOW (ref 80.0–100.0)
Platelets: 299 10*3/uL (ref 150–400)
RBC: 3.08 MIL/uL — ABNORMAL LOW (ref 3.87–5.11)
RDW: 20.8 % — ABNORMAL HIGH (ref 11.5–15.5)
WBC: 8.5 10*3/uL (ref 4.0–10.5)
nRBC: 0.2 % (ref 0.0–0.2)

## 2019-12-15 LAB — GLUCOSE, CAPILLARY
Glucose-Capillary: 117 mg/dL — ABNORMAL HIGH (ref 70–99)
Glucose-Capillary: 132 mg/dL — ABNORMAL HIGH (ref 70–99)
Glucose-Capillary: 133 mg/dL — ABNORMAL HIGH (ref 70–99)
Glucose-Capillary: 137 mg/dL — ABNORMAL HIGH (ref 70–99)

## 2019-12-15 LAB — BASIC METABOLIC PANEL
Anion gap: 8 (ref 5–15)
BUN: 17 mg/dL (ref 6–20)
CO2: 29 mmol/L (ref 22–32)
Calcium: 8 mg/dL — ABNORMAL LOW (ref 8.9–10.3)
Chloride: 96 mmol/L — ABNORMAL LOW (ref 98–111)
Creatinine, Ser: 0.5 mg/dL (ref 0.44–1.00)
GFR calc Af Amer: >60 mL/min (ref >60)
GFR calc non Af Amer: >60 mL/min (ref >60)
Glucose, Bld: 134 mg/dL — ABNORMAL HIGH (ref 70–99)
Potassium: 3.9 mmol/L (ref 3.5–5.1)
Sodium: 133 mmol/L — ABNORMAL LOW (ref 135–145)

## 2019-12-15 LAB — MAGNESIUM: Magnesium: 1.9 mg/dL (ref 1.7–2.4)

## 2019-12-15 LAB — PREPARE RBC (CROSSMATCH)

## 2019-12-15 MED ORDER — SODIUM CHLORIDE 0.9% IV SOLUTION: Freq: Once | INTRAVENOUS | Status: AC

## 2019-12-15 NOTE — Progress Notes (Signed)
Mobility Specialist - Progress Note   12/15/19 1357  Mobility  Activity Dangled on edge of bed;Stood at bedside  Range of Motion/Exercises All extremities (ankle pumps, quad sets, slr, crossover reach, arm raises)  Level of Assistance Maximum assist, patient does 25-49%  Assistive Device Front wheel walker  Distance Ambulated (ft) 0 ft  Mobility Response Tolerated well  Mobility performed by Mobility specialist  $Mobility charge 1 Mobility    Pre-mobility: 108 HR, 98% SpO2 Post-mobility: 111 HR, 93% SpO2   Pt was lying in bed upon arrival. Pt agreed to session. Pt was able to perform supine exercises: ankle pumps, quad sets, slr, hip add/abd, crossover reach, arm raises, elbow flex/ext with no physical assistance. Pt was able to get to EOB with maxA +2 this session. Pt was un-motivated to stand initially, but with encouragement was able to stand to RW with minA. Pt was able to get EOB-supine with maxA +2. Overall, pt tolerated session well. Pt was left in bed with all needs in reach. Nurse was notified.    Filiberto Pinks Mobility Specialist 12/15/19, 2:05 PM

## 2019-12-15 NOTE — Progress Notes (Signed)
ID  Date of Admission:11/09/2019  11/18/19 laparotomy Antibiotic Meropenem 9/9>10/4 anidulaungin 9/9 >9/17 Fluconazole 11/27/19>>12/07/19 Linezolid 12/11/19> Ceftriaxone 10/5  Culture8/30 BC -NG 9/3 intraabdominal abscess culture e.coli,  Streptococcus group C 11/27/19 -RT Upper quadrant abscess culture- lactobacillus and candida albicans 9/29 JP drain fluid- efeacium, e.coli  Foley -11/18/19>>12/14/19 PICC Rt arm 11/19/19>> Rt upper quadrant drain -11/27/19>> Rt LQ drain 11/18/19>> Left LQ drain 12/07/19>> Rectal tube  subjective Pt is feeling better Participated in PT- sat EOB Getting 1 unit PRBC   O/e awake and alert Patient Vitals for the past 24 hrs:  BP Temp Temp src Pulse Resp SpO2  12/15/19 1627 132/69 98.8 F (37.1 C) Oral (!) 110 14 97 %  12/15/19 1623 132/69 98.8 F (37.1 C) Oral (!) 110 14 97 %  12/15/19 1354 116/81 99.2 F (37.3 C) Oral (!) 106 20 98 %  12/15/19 1324 128/90 99.2 F (37.3 C) Oral (!) 108 20 100 %  12/15/19 1150 135/62 99.1 F (37.3 C) Oral (!) 104 16 97 %  12/15/19 0746 116/78 98.6 F (37 C) Oral (!) 104 20 97 %  12/15/19 0506 (!) 136/93 98.2 F (36.8 C) Oral (!) 103 -- 95 %  12/14/19 2334 110/78 98.6 F (37 C) -- (!) 110 18 94 %  12/14/19 2009 115/60 98 F (36.7 C) Oral (!) 108 19 94 %    Chest b/l air entry Decreased bases Hs- tachycardia Abd- obese- 3 drains- 1 rt UQ, RT LQ, LLQ purewick  Labs CBC Latest Ref Rng & Units 12/15/2019 12/14/2019 12/13/2019  WBC 4.0 - 10.5 K/uL 8.5 8.8 9.0  Hemoglobin 12.0 - 15.0 g/dL 7.3(L) 7.4(L) 7.4(L)  Hematocrit 36 - 46 % 24.3(L) 23.9(L) 23.0(L)  Platelets 150 - 400 K/uL 299 320 290    CMP Latest Ref Rng & Units 12/15/2019 12/14/2019 12/12/2019  Glucose 70 - 99 mg/dL 132(G) 401(U) 272(Z)  BUN 6 - 20 mg/dL 17 17 19   Creatinine 0.44 - 1.00 mg/dL 3.66 4.40  Sodium 135 - 145 mmol/L 133(L) 135 134(L)  Potassium 3.5 - 5.1 mmol/L 3.9 4.0 3.9  Chloride 98 - 111 mmol/L 96(L) 97(L) 98  CO2 22  - 32 mmol/L 29 32 28  Calcium 8.9 - 10.3 mg/dL 8.0(L) 8.0(L) 8.5(L)  Total Protein 6.5 - 8.1 g/dL - 7.9 -  Total Bilirubin 0.3 - 1.2 mg/dL - 0.5 -  Alkaline Phos 38 - 126 U/L - 144(H) -  AST 15 - 41 U/L - 30 -  ALT 0 - 44 U/L - 79(H) -    Impression/recommendation Perforated diverticulitis with extensive intra-abdominal loop abscesses-admitted 11/09/19 Status post laparoscopy and washout.on 11/18/19 CT abdomen done9/16shows an abscess along rtlateral margin liverand organizing abscesses central abdominal mesentry - . IRplaced another drain9/17.Culturelactobacillus and rare candida albicans -on meropenem  CT abdomenwas donea new drain placed on 12/07/19 repeat culture enterococcus faecium and e.coli  added linezolid on 10/1 ( but not VRE) It is not going to be possible to treat the stool organisms which are likely  leaking intrabdominally thru Perforation/fistula. This is causing ongoing low level cytokine response inspite of antibiotic. she has developed MDRO . Tight source control is the key As WBC nromalized with linezolid which was started on 10/1 continue until 12/18/19 Will DC meropenem and give ceftriaxone until 10/8 and then stop both antibiotics and observe  Chronic foley since 9/8-removed on 12/14/19- has purewick and passing urine well  Anemia-Hb 7.3-  she has been persistently tachycardic and unable to work with  PT for long because of tachycardia and dizziness Got 1 unit PRBC today  Morbid obesity   She needs to consistently participate in PT, do incentive spirometry, sit up in bed/chair Discussed with patient and her mother

## 2019-12-15 NOTE — Progress Notes (Signed)
[facility] SURGICAL PROGRESS NOTE  Hospital Day(s): 36.   Post op day(s): 27 Days Post-Op.   Interval History:  Patient seen and examined No acute events or new complaints overnight.  Patient reports she continues to feel a little better daily Only pain is intermittent pain at drain sites, PO medications helping No fever, chills, CP, SOB, nausea, emesis Leukocytosis remains resolved x72 hours Hgb low, but stable, at 7.3 Renal function remains normal, sCr - 0.50, UO - 2.1L Drain output as follows:  - RLQ:5ccs,this appears more purulent - LLQ:130ccs,appears morefeculent Meropenem stopped on 10/4, Ceftriaxone started 10/5 (Day 1/4), Linezolid added on 10/01 (Day 4/7); ID following Worked with therapies on 10/04; recommendations are SNF; she reports she has been getting to edge of bed and working on getting OOB/Standing   Vital signs in last 24 hours: [min-max] current  Temp:  [97.7 F (36.5 C)-98.6 F (37 C)] 98.2 F (36.8 C) (10/05 0506) Pulse Rate:  [103-115] 103 (10/05 0506) Resp:  [18-19] 18 (10/04 2334) BP: (110-136)/(60-93) 136/93 (10/05 0506) SpO2:  [94 %-100 %] 95 % (10/05 0506)     Height: 5\' 9"  (175.3 cm) Weight: (!) 220.4 kg BMI (Calculated): 71.74   Intake/Output last 2 shifts:  10/04 0701 - 10/05 0700 In: 1634.4 [P.O.:600; I.V.:624.4; IV Piggyback:400] Out: 2785 [Urine:2150; Drains:135; Stool:500]   Physical Exam:  Constitutional: alert, cooperative and no distress Respiratory:Breathing appears to be improving, on RA, less tachypneic, still with some intermittent conversational dyspnea but again this has been her baseline herein the hospital Cardiovascular:Tachycardicand sinus rhythm  Gastrointestinal:Obese, soft,no appreciable tenderness aside from drains, non-distended. Rectal tube in place. RLQ drain with seropurulent output, LLQ drain with feculent output.  Musculoskeletal: 1+ edema to bilateral  LE Integumentary:Laparoscopic incisions are well healed   Labs:  CBC Latest Ref Rng & Units 12/15/2019 12/14/2019 12/13/2019  WBC 4.0 - 10.5 K/uL 8.5 8.8 9.0  Hemoglobin 12.0 - 15.0 g/dL 7.3(L) 7.4(L) 7.4(L)  Hematocrit 36 - 46 % 24.3(L) 23.9(L) 23.0(L)  Platelets 150 - 400 K/uL 299 320 290   CMP Latest Ref Rng & Units 12/15/2019 12/14/2019 12/12/2019  Glucose 70 - 99 mg/dL 02/11/2020) 322(G) 254(Y)  BUN 6 - 20 mg/dL 17 17 19   Creatinine 0.44 - 1.00 mg/dL 706(C 3.76  Sodium 135 - 145 mmol/L 133(L) 135 134(L)  Potassium 3.5 - 5.1 mmol/L 3.9 4.0 3.9  Chloride 98 - 111 mmol/L 96(L) 97(L) 98  CO2 22 - 32 mmol/L 29 32 28  Calcium 8.9 - 10.3 mg/dL 8.0(L) 8.0(L) 8.5(L)  Total Protein 6.5 - 8.1 g/dL - 7.9 -  Total Bilirubin 0.3 - 1.2 mg/dL - 0.5 -  Alkaline Phos 38 - 126 U/L - 144(H) -  AST 15 - 41 U/L - 30 -  ALT 0 - 44 U/L - 79(H) -     Imaging studies: No new pertinent imaging studies   Assessment/Plan:  34 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 27 Days Post-Op s/p robotic assisted laparoscopic lysis of adhesion and drainage of intra-abdominal abscess WITHOUT evidence of perforation or frank stoolforperforated diverticulitis, complicated by hypoxic respiratory failure requiring post-operative intubation(since extubated)   - Okay to continue soft/regular diet + nutritional supplementation               - We will stop TPN today             - Continue IV ABx; Meropenem stopped on 10/04, started on Ceftriaxone (Day 1/4),  Linezolid (Day 4/7); ID following; plan to stop ABx this week             - Continue RLQ/LLQ drains; monitor and record output - No plans for surgical re-intervention at this time - Monitor abdominal examination; on-going bowel function - Pain control prn; antiemetics prn -Monitor H&H; s/p transfusion 1 unit pRBC on 09/21 --> only re-transfuse if "absolutely necessary"               - Monitor leukocytosis, renal function             - Consider rectal tube removal today/tomorrow if patient can mobilize to commode --> encouraged practicing transitioning to commode today             - Home medications; continue; appreciate hospitalist consultation -Mobilization encouraged as tolerated; PT/OT following; recommending SNF - DVT prophylaxis              - Discharge Planning; Need to completely wean from TPN, remove rectal tube, ensure leukocytosis remains resolved, continue working with therapies...Marland Kitchenhopefully ready for DC sometime in the next week to SNF/Rehab. No rush nor guarantees in this complex case.   All of the above findings and recommendations were discussed with the patient, and the medical team, and all of patient's questions were answered to her expressed satisfaction.  -- Lynden Oxford, PA-C [facility] 12/15/2019, 7:24 AM [phone] M-F: 7am - 4pm

## 2019-12-15 NOTE — Progress Notes (Signed)
PROGRESS NOTE    Amanda Reyes  PJA:[id number] DOB: March 18, 1985 DOA: 11/09/2019 PCP: Patient, No Pcp Per   Brief Narrative: Amanda Reyes is a 34 y.o. female status post operative drainage of intra-abdominal abscesses.  No evidence of perforation or frank stool.  Postoperative course complicated by hypoxic respiratory failure requiring intubation.  Now extubated. Course continues to be complicated by recurrent fever requiring repeat drain placement and continued IV antibiotics. Currently on TPN. Significant physical debility with likely SNF discharge.   Assessment & Plan:   Active Problems:   Diverticulitis of colon with perforation   Diverticulitis of large intestine with perforation and abscess   Acute respiratory failure with hypoxia and hypercapnia (HCC)   Essential hypertension   Intra-abdominal abscesses Patient is status post laparoscopic lysis of adhesions and drainage of abscesses.  Patient also had drain placement on 9/8 upsized on 9/17 in addition to the drain placed on 9/27 for chronic fevers and LLQ abscess.  Patient was previously on ciprofloxacin and flagyl, transitioned to  meropenem and anidulafungin -> fluconazole for treatment of infection/abscess continued E. coli with evidence of few lactobacillus species and rare Candida albicans.  Fluconazole was eventually.  Discontinued. Drain culture (9/28) shows few enterococcus faecium which is sensitive to Vancomycin; patient started on Linezolid. LLQ drain continues to drain feculent appearing fluid. Leukocytosis improving. -ID recommendations: Meropenem, Linezolid -Will discuss with PT if patient is a candidate for [facility]  Recurrent fever Secondary to above.  Resolved. Currently is afebrile but with recurrent high grade temperatures which have no improved.  Acute respiratory failure with hypoxia and hypercapnia Patient required intubation and mechanical ventilation post surgery. Weaned to room  air.  Essential hypertension Better controlled on hydrochlorothiazide. Patient is on losartan-hydrochlorothiazide as an outpatient. -Continue HCTZ 25 mg daily  Anemia Microcytic. Likely iron deficient combined with acute illness. Menstruating female, however hemoglobin has been normal prior to illness. No iron panel available. She has received at least 1 unit of PRBC to date but it is difficult to see if she got PRBC on 9/8. Recently had her period. Patient is symptomatic with therapy on 10/4 and after further discussion is agreeing to transfusion today -Transfuse 1 unit of PRBC  Tachycardia Patient has had persistent tachycardia this admission.  In setting of infection.  Patient was also amlodipine which may have been contributing but likely very minimally.  Currently not hemodynamically unstable.  No need for tight control at this time.  Malnutrition Morbid obesity Currently on TPN secondary to above. Also with evidence of hypoalbuminemia last albumin of 1.6.  Dietitian is consulted.  Patient is currently advanced to a regular diet. -General surgery recommendations for TPN/diet  Hypomagnesemia Patient is on TPN. Management by pharmacy  Transaminitis Possibly secondary to TPN.  Currently stable.  History of cholecystectomy.  Last CT scan without evidence of biliary obstruction.  Previous hepatic fluid collection almost completely resolved on last CT (9/25). AST/ALT trended down -Daily CMP  AKI Resolved.  Pre-diabetes Patient does not appear to have a diagnosis of diabetes.  Last hemoglobin A1c of 5.7%.  Complicated by significant obesity.  Patient will benefit from Metformin use on discharge as she likely has a significant amount of insulin resistance.  Patient is currently managed on sliding scale insulin while inpatient. -Continue SSI   DVT prophylaxis: Per primary (Lovenox) Code Status:   Code Status: Full Code Family Communication: Mother at bedside Disposition Plan: Per  primary, General Surgery   Antimicrobials:  Meropenem  Anidulafungin  Fluconazole    Subjective: Pain is improved.  Objective: Vitals:   12/14/19 2009 12/14/19 2334 12/15/19 0506 12/15/19 0746  BP: 115/60 110/78 (!) 136/93 116/78  Pulse: (!) 108 (!) 110 (!) 103 (!) 104  Resp: 19 18  20   Temp: 98 F (36.7 C) 98.6 F (37 C) 98.2 F (36.8 C) 98.6 F (37 C)  TempSrc: Oral  Oral Oral  SpO2: 94% 94% 95% 97%  Weight:      Height:        Intake/Output Summary (Last 24 hours) at 12/15/2019 0956 Last data filed at 12/15/2019 0529 Gross per 24 hour  Intake 1514.35 ml  Output 2135 ml  Net -620.65 ml   Filed Weights   12/03/19 0423 12/05/19 0438 12/07/19 0454  Weight: (!) 198 kg (!) 224.5 kg (!) 220.4 kg    Examination:  General exam: Appears calm and comfortable Respiratory system: Clear to auscultation. Respiratory effort normal. Cardiovascular system: S1 & S2 heard, RRR. No murmurs, rubs, gallops or clicks. Gastrointestinal system: Abdomen is obese, soft and mildly tender in RUQ. No organomegaly or masses felt. Normal bowel sounds heard. Drains in place. Central nervous system: Alert and oriented. No focal neurological deficits. Musculoskeletal: BLE edema. No calf tenderness Skin: No cyanosis. No rashes Psychiatry: Judgement and insight appear normal. Mood & affect appropriate.       Data Reviewed: I have personally reviewed following labs and imaging studies  CBC Lab Results  Component Value Date   WBC 8.5 12/15/2019   RBC 3.08 (L) 12/15/2019   HGB 7.3 (L) 12/15/2019   HCT 24.3 (L) 12/15/2019   MCV 78.9 (L) 12/15/2019   MCH 23.7 (L) 12/15/2019   PLT 299 12/15/2019   MCHC 30.0 12/15/2019   RDW 20.8 (H) 12/15/2019   LYMPHSABS 1.8 12/14/2019   MONOABS 0.5 12/14/2019   EOSABS 0.3 12/14/2019   BASOSABS 0.0 12/14/2019     Last metabolic panel Lab Results  Component Value Date   NA 133 (L) 12/15/2019   K 3.9 12/15/2019   CL 96 (L) 12/15/2019   CO2 29  12/15/2019   BUN 17 12/15/2019   CREATININE 0.50 12/15/2019   GLUCOSE 134 (H) 12/15/2019   GFRNONAA >60 12/15/2019   GFRAA >60 12/15/2019   CALCIUM 8.0 (L) 12/15/2019   PHOS 4.6 12/14/2019   PROT 7.9 12/14/2019   ALBUMIN 1.6 (L) 12/14/2019   BILITOT 0.5 12/14/2019   ALKPHOS 144 (H) 12/14/2019   AST 30 12/14/2019   ALT 79 (H) 12/14/2019   ANIONGAP 8 12/15/2019    CBG (last 3)  Recent Labs    12/14/19 1641 12/15/19 0026 12/15/19 0609  GLUCAP 141* 132* 137*     GFR: Estimated Creatinine Clearance: 202 mL/min (by C-G formula based on SCr of 0.5 mg/dL).  Coagulation Profile: No results for input(s): INR, PROTIME in the last 168 hours.  Recent Results (from the past 240 hour(s))  Body fluid culture     Status: None   Collection Time: 12/08/19  9:56 PM   Specimen: JP Drain; Body Fluid  Result Value Ref Range Status   Specimen Description   Final    JP DRAINAGE Performed at [facility], [address] [place], [place]    Special Requests   Final    NONE Performed at [facility], [address] [place], [place]    Gram Stain   Final    ABUNDANT WBC PRESENT, PREDOMINANTLY PMN RARE GRAM POSITIVE COCCI  IN PAIRS Performed at [facility], [address], [place], [place] [address]    Culture FEW ESCHERICHIA COLI FEW ENTEROCOCCUS FAECIUM   Final   Report Status 12/12/2019 FINAL  Final   Organism ID, Bacteria ESCHERICHIA COLI  Final   Organism ID, Bacteria ENTEROCOCCUS FAECIUM  Final      Susceptibility   Escherichia coli - MIC*    AMPICILLIN >=32 RESISTANT Resistant     CEFAZOLIN <=4 SENSITIVE Sensitive     CEFEPIME <=0.12 SENSITIVE Sensitive     CEFTAZIDIME <=1 SENSITIVE Sensitive     CEFTRIAXONE <=0.25 SENSITIVE Sensitive     CIPROFLOXACIN >=4 RESISTANT Resistant     GENTAMICIN >=16 RESISTANT Resistant     IMIPENEM <=0.25 SENSITIVE Sensitive     TRIMETH/SULFA >=320 RESISTANT Resistant     AMPICILLIN/SULBACTAM  >=32 RESISTANT Resistant     PIP/TAZO <=4 SENSITIVE Sensitive     * FEW ESCHERICHIA COLI   Enterococcus faecium - MIC*    AMPICILLIN <=2 SENSITIVE Sensitive     VANCOMYCIN <=0.5 SENSITIVE Sensitive     GENTAMICIN SYNERGY SENSITIVE Sensitive     * FEW ENTEROCOCCUS FAECIUM        Radiology Studies: No results found.      Scheduled Meds: . sodium chloride   Intravenous Once  . amLODipine  10 mg Oral Daily  . Chlorhexidine Gluconate Cloth  6 each Topical Q0600  . enoxaparin (LOVENOX) injection  40 mg Subcutaneous Q12H  . feeding supplement (ENSURE ENLIVE)  237 mL Oral TID BM  . hydrochlorothiazide  25 mg Oral Daily  . insulin aspart  0-20 Units Subcutaneous Q6H  . mouth rinse  15 mL Mouth Rinse q12n4p  . pantoprazole (PROTONIX) IV  40 mg Intravenous Q12H  . sodium chloride flush  10-40 mL Intracatheter Q12H  . sodium chloride flush  5 mL Intracatheter Q8H  . sodium chloride flush  5 mL Intracatheter Q8H   Continuous Infusions: . sodium chloride Stopped (12/12/19 0945)  . cefTRIAXone (ROCEPHIN)  IV 2 g (12/15/19 0757)  . linezolid (ZYVOX) IV 600 mg (12/14/19 2224)  . TPN ADULT (ION) 45 mL/hr at 12/14/19 2000     LOS: 36 days     Jacquelin Hawking, MD [facility] 12/15/2019, 9:56 AM  If 7PM-7AM, please contact night-coverage [email or web address]

## 2019-12-16 DIAGNOSIS — D649 Anemia, unspecified: Secondary | ICD-10-CM | POA: Diagnosis not present

## 2019-12-16 DIAGNOSIS — K5720 Diverticulitis of large intestine with perforation and abscess without bleeding: Secondary | ICD-10-CM | POA: Diagnosis not present

## 2019-12-16 DIAGNOSIS — K5792 Diverticulitis of intestine, part unspecified, without perforation or abscess without bleeding: Secondary | ICD-10-CM | POA: Diagnosis not present

## 2019-12-16 DIAGNOSIS — L8990 Pressure ulcer of unspecified site, unspecified stage: Secondary | ICD-10-CM | POA: Insufficient documentation

## 2019-12-16 DIAGNOSIS — K651 Peritoneal abscess: Secondary | ICD-10-CM | POA: Diagnosis not present

## 2019-12-16 LAB — TYPE AND SCREEN
ABO/RH(D): A POS
Antibody Screen: NEGATIVE
Unit division: 0

## 2019-12-16 LAB — CBC
HCT: 26 % — ABNORMAL LOW (ref 36.0–46.0)
Hemoglobin: 8.4 g/dL — ABNORMAL LOW (ref 12.0–15.0)
MCH: 24.6 pg — ABNORMAL LOW (ref 26.0–34.0)
MCHC: 32.3 g/dL (ref 30.0–36.0)
MCV: 76 fL — ABNORMAL LOW (ref 80.0–100.0)
Platelets: 295 10*3/uL (ref 150–400)
RBC: 3.42 MIL/uL — ABNORMAL LOW (ref 3.87–5.11)
RDW: 20.4 % — ABNORMAL HIGH (ref 11.5–15.5)
WBC: 8.1 10*3/uL (ref 4.0–10.5)
nRBC: 0 % (ref 0.0–0.2)

## 2019-12-16 LAB — BPAM RBC
Blood Product Expiration Date: 202110122359
ISSUE DATE / TIME: 202110051331
Unit Type and Rh: 600

## 2019-12-16 NOTE — Progress Notes (Signed)
Mobility Specialist - Progress Note   12/16/19 1702  Mobility  Activity Stood at bedside;Dangled on edge of bed (Standing marches x 15 total and took 3 R steps at bedside)  Level of Assistance Minimal assist, patient does 75% or more (mod. assist supine-sit/sit-supine, +2 assist)  Assistive Device Front wheel walker  Mobility Response Tolerated well  Mobility performed by Mobility specialist  $Mobility charge 1 Mobility     Re-attempted session at this time per request of pt. Pt laying in bed upon arrival w/ mom present in room. Pt very agreeable to session. Pt needed min-mod. Assist transitioning from supine to sit, mainly needing mod. Assist + 3 w/ elevating UE to sit up. Pt able to scoot up to EOB SBA. Pt dangled at EOB for a couple minutes to mentally and physically prepare to stand. Pt able to stand w/ min. Assist, +2 for safety, using a RW. Pt stood at bedside for a couple of minutes. Pt progressed to standing marches x 15. Then, pt took 3 R steps along bedside. Pt transitioned from sit to supine w/ max assist +1. Pt repositioned self in bed w/ SBA. Overall, pt tolerated session very well. Pt motivated t/o session. Pt left laying in bed w/ mom and nurse present in room.    Rumaldo Difatta Mobility Specialist  12/16/19, 5:31 PM

## 2019-12-16 NOTE — Progress Notes (Addendum)
   12/16/19 1030  Clinical Encounter Type  Visited With Patient and family together  Visit Type Follow-up  Referral From Chaplain  Consult/Referral To [facility] along with Chaplain Genesis briefly stopped in to check on Pt. Pt said she is feeling better. Chaplain Ivor Messier said, just have to get you moving around and Pt said yes. Pt's mother said they will try to move her to rehab next week.

## 2019-12-16 NOTE — Progress Notes (Signed)
[facility] SURGICAL PROGRESS NOTE  Hospital Day(s): 37.   Post op day(s): 28 Days Post-Op.   Interval History:  Patient seen and examined No acute events or new complaints overnight.  Patient reports she continues to feel a little better daily Only pain is intermittent pain at drain sites, PO medications helping, mostly Tramadol No fever, chills, CP, SOB, nausea, emesis Leukocytosis remains resolved x96 hours Hgb low, butimproved, at 8.4 Renal function remains normal, UO - 2.7L Drain output as follows:  - RLQ:10ccs,this appears more purulent - LLQ:45ccs,appears morefeculent Meropenem stopped on 10/4, Ceftriaxone started 10/5 (Day 2/4), Linezolid added on 10/01 (Day 5/7); ID following Worked with therapies on 10/04; recommendations are SNF; she reports she has been getting to edge of bed and working on getting OOB/Standing   Vital signs in last 24 hours: [min-max] current  Temp:  [97.7 F (36.5 C)-99.2 F (37.3 C)] 97.7 F (36.5 C) (10/06 0409) Pulse Rate:  [98-110] 98 (10/06 0409) Resp:  [14-20] 18 (10/06 0409) BP: (116-140)/(62-93) 123/93 (10/06 0409) SpO2:  [96 %-100 %] 97 % (10/06 0409)     Height: 5\' 9"  (175.3 cm) Weight: (!) 220.4 kg BMI (Calculated): 71.74   Intake/Output last 2 shifts:  10/05 0701 - 10/06 0700 In: 312 [Blood:312] Out: 2775 [Urine:2720; Drains:55]   Physical Exam:  Constitutional: alert, cooperative and no distress Respiratory:Breathing appears to be improving, on RA, less tachypneic, still with some intermittent conversational dyspnea but again this has been her baseline herein the hospital Cardiovascular:Regular rate and sinus rhythm  Gastrointestinal:Obese, soft,no appreciable tenderness aside from drains, non-distended. Rectal tube in place. RLQ drain with seropurulent output, LLQ drain with feculent output.  Musculoskeletal: 1+ edema to bilateral LE Integumentary:Laparoscopic incisions are  well healed   Labs:  CBC Latest Ref Rng & Units 12/16/2019 12/15/2019 12/14/2019  WBC 4.0 - 10.5 K/uL 8.1 8.5 8.8  Hemoglobin 12.0 - 15.0 g/dL 02/13/2020) 7.3(L) 7.4(L)  Hematocrit 36 - 46 % 26.0(L) 24.3(L) 23.9(L)  Platelets 150 - 400 K/uL 295 299 320   CMP Latest Ref Rng & Units 12/15/2019 12/14/2019 12/12/2019  Glucose 70 - 99 mg/dL 02/11/2020) 858(I) 502(D)  BUN 6 - 20 mg/dL 17 17 19   Creatinine 0.44 - 1.00 mg/dL 741(O 8.78  Sodium 135 - 145 mmol/L 133(L) 135 134(L)  Potassium 3.5 - 5.1 mmol/L 3.9 4.0 3.9  Chloride 98 - 111 mmol/L 96(L) 97(L) 98  CO2 22 - 32 mmol/L 29 32 28  Calcium 8.9 - 10.3 mg/dL 8.0(L) 8.0(L) 8.5(L)  Total Protein 6.5 - 8.1 g/dL - 7.9 -  Total Bilirubin 0.3 - 1.2 mg/dL - 0.5 -  Alkaline Phos 38 - 126 U/L - 144(H) -  AST 15 - 41 U/L - 30 -  ALT 0 - 44 U/L - 79(H) -     Imaging studies: No new pertinent imaging studies   Assessment/Plan:  34 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 28 Days Post-Op s/p robotic assisted laparoscopic lysis of adhesion and drainage of intra-abdominal abscess WITHOUT evidence of perforation or frank stoolforperforated diverticulitis, complicated by hypoxic respiratory failure requiring post-operative intubation(since extubated)   - Goal for today: strict I/Os from rectal tube to determine degree of fecal urgency, if this is reasonable we can try imodium/fiber and remove rectal tube, hopefully in 24/48 hours   - Okay to continue soft/regular diet + nutritional supplementation               - Continue  IV ABx; Meropenem stopped on 10/04, started on Ceftriaxone (Day 1/4), Linezolid (Day 4/7); ID following; plan to stop ABx this week             - Continue RLQ/LLQ drains; monitor and record output - No plans for surgical re-intervention at this time - Monitor abdominal examination; on-going bowel function - Pain control prn; antiemetics prn -Monitor  H&H; s/p transfusion 1 unit pRBC on 09/21 --> only re-transfuse if "absolutely necessary"              - Monitor leukocytosis, renal function             - Consider rectal tube removal today/tomorrow if patient can mobilize to commode --> encouraged practicing transitioning to commode today             - Home medications; continue; appreciate hospitalist consultation -Mobilization encouraged as tolerated; PT/OT following; recommending SNF - DVT prophylaxis              - Discharge Planning; Need to completely wean from TPN, remove rectal tube, ensure leukocytosis remains resolved, continue working with therapies...Marland Kitchenhopefully ready for DC sometime in the next week to SNF/Rehab. No rush nor guarantees in this complex case.   All of the above findings and recommendations were discussed with the patient, and the medical team, and all of patient's questions were answered to her expressed satisfaction.  -- Lynden Oxford, PA-C [facility] 12/16/2019, 7:30 AM [phone] M-F: 7am - 4pm

## 2019-12-16 NOTE — NC FL2 (Signed)
[place] MEDICAID FL2 LEVEL OF CARE SCREENING TOOL     IDENTIFICATION  Patient Name: Amanda Reyes Birthdate: 1986/02/28 Sex: female Admission Date (Current Location): 11/09/2019  [facility] and [place] Number:  Chiropodist and Address:  [facility], [address], [place], [place] [address]      Provider Number: [id number]  Attending Physician Name and Address:  Campbell Lerner, MD  Relative Name and Phone Number:  Diane Rosser-mother [phone]    Current Level of Care: Hospital Recommended Level of Care: Skilled Nursing Facility Prior Approval Number:    Date Approved/Denied:   PASRR Number: [id number]  Discharge Plan: SNF    Current Diagnoses: Patient Active Problem List   Diagnosis Date Noted   Pressure injury of skin 12/16/2019   Acute respiratory failure with hypoxia and hypercapnia (HCC) 12/14/2019   Essential hypertension 12/14/2019   Diverticulitis of large intestine with perforation and abscess 11/23/2019   Diverticulitis of colon with perforation 11/09/2019   Cholelithiasis 08/01/2012   Obesity 08/01/2012    Orientation RESPIRATION BLADDER Height & Weight     Self, Time, Situation, Place  Normal Incontinent, External catheter Weight: (!) 486 lb (220.4 kg) Height:  5\' 9"  (175.3 cm)  BEHAVIORAL SYMPTOMS/MOOD NEUROLOGICAL BOWEL NUTRITION STATUS   (None)  (None) Incontinent (Has a rectal tube but it will be removed prior to discharge.) Diet (Soft)  AMBULATORY STATUS COMMUNICATION OF NEEDS Skin   Extensive Assist Verbally PU Stage and Appropriate Care   PU Stage 2 Dressing:  (Medial: Foam.)    Cath entry/exit               Personal Care Assistance Level of Assistance  Bathing, Feeding, Dressing Bathing Assistance: Maximum assistance Feeding assistance: Maximum assistance Dressing Assistance: Maximum assistance   Functional Limitations Info  Sight, Hearing, Speech Sight Info: Adequate Hearing Info:  Adequate Speech Info: Adequate    SPECIAL CARE FACTORS FREQUENCY  PT (By licensed PT), OT (By licensed OT)     PT Frequency: 5 x week OT Frequency: 5 x week            Contractures Contractures Info: Not present    Additional Factors Info  Code Status, Allergies Code Status Info: Full code Allergies Info: Penicillins, Tomato           Current Medications (12/16/2019):  This is the current hospital active medication list Current Facility-Administered Medications  Medication Dose Route Frequency Provider Last Rate Last Admin   0.9 %  sodium chloride infusion   Intravenous PRN 02/15/2020, PA-C   Stopped at 12/12/19 0945   acetaminophen (TYLENOL) tablet 650 mg  650 mg Oral Q6H PRN 02/11/20 B, MD   650 mg at 12/11/19 0750   alum & mag hydroxide-simeth (MAALOX/MYLANTA) 200-200-20 MG/5ML suspension 30 mL  30 mL Oral Q4H PRN 09-18-2000, MD   30 mL at 11/15/19 2032   amLODipine (NORVASC) tablet 10 mg  10 mg Oral Daily Sreenath, Sudheer B, MD   10 mg at 12/16/19 0908   cefTRIAXone (ROCEPHIN) 2 g in sodium chloride 0.9 % 100 mL IVPB  2 g Intravenous Q24H 02/15/20, MD 200 mL/hr at 12/16/19 0804 2 g at 12/16/19 0804   Chlorhexidine Gluconate Cloth 2 % PADS 6 each  6 each Topical Q0600 02/15/20, MD   6 each at 12/15/19 1028   enoxaparin (LOVENOX) injection 40 mg  40 mg Subcutaneous Q12H 02/14/20, MD   40 mg at  12/16/19 0907   feeding supplement (ENSURE ENLIVE) (ENSURE ENLIVE) liquid 237 mL  237 mL Oral TID BM Campbell Lerner, MD   237 mL at 12/16/19 1041   hydrochlorothiazide (HYDRODIURIL) tablet 25 mg  25 mg Oral Daily Narda Bonds, MD   25 mg at 12/16/19 0907   HYDROmorphone (DILAUDID) injection 0.5-1 mg  0.5-1 mg Intravenous Q3H PRN Lynden Oxford R, PA-C   1 mg at 12/16/19 1401   ipratropium-albuterol (DUONEB) 0.5-2.5 (3) MG/3ML nebulizer solution 3 mL  3 mL Nebulization Q4H PRN Manuela Schwartz, NP   3 mL at 11/24/19 0503    labetalol (NORMODYNE) injection 20 mg  20 mg Intravenous Q2H PRN Lolita Patella B, MD   20 mg at 12/06/19 2002   linezolid (ZYVOX) IVPB 600 mg  600 mg Intravenous Q12H Lynn Ito, MD 300 mL/hr at 12/16/19 0910 600 mg at 12/16/19 0910   lip balm (BLISTEX) ointment   Topical PRN Andreas Ohm, NP   Given at 11/24/19 2154   LORazepam (ATIVAN) injection 2 mg  2 mg Intravenous Q6H PRN Donovan Kail, PA-C   2 mg at 12/11/19 0502   MEDLINE mouth rinse  15 mL Mouth Rinse q12n4p Salena Saner, MD   15 mL at 12/15/19 1312   methocarbamol (ROBAXIN) tablet 500 mg  500 mg Oral Q6H PRN Campbell Lerner, MD   500 mg at 12/15/19 1346   ondansetron (ZOFRAN-ODT) disintegrating tablet 4 mg  4 mg Oral Q6H PRN Campbell Lerner, MD   4 mg at 11/15/19 2028   Or   ondansetron (ZOFRAN) injection 4 mg  4 mg Intravenous Q6H PRN Campbell Lerner, MD   4 mg at 12/14/19 2003   oxyCODONE (Oxy IR/ROXICODONE) immediate release tablet 5-10 mg  5-10 mg Oral Q4H PRN Campbell Lerner, MD   10 mg at 12/15/19 0024   pantoprazole (PROTONIX) injection 40 mg  40 mg Intravenous Q12H Campbell Lerner, MD   40 mg at 12/16/19 4076   polyvinyl alcohol (LIQUIFILM TEARS) 1.4 % ophthalmic solution 1 drop  1 drop Both Eyes PRN Campbell Lerner, MD   1 drop at 11/25/19 0453   promethazine (PHENERGAN) injection 12.5 mg  12.5 mg Intravenous Q6H PRN Campbell Lerner, MD   12.5 mg at 11/22/19 0138   sodium chloride flush (NS) 0.9 % injection 10-40 mL  10-40 mL Intracatheter Q12H Kasa, Wallis Bamberg, MD   10 mL at 12/15/19 2140   sodium chloride flush (NS) 0.9 % injection 5 mL  5 mL Intracatheter Q8H Berdine Dance, MD   5 mL at 12/15/19 1346   sodium chloride flush (NS) 0.9 % injection 5 mL  5 mL Intracatheter Q8H Irish Lack, MD   5 mL at 12/16/19 0415   traMADol (ULTRAM) tablet 50 mg  50 mg Oral Q4H PRN Donovan Kail, PA-C   50 mg at 12/16/19 1317     Discharge Medications: Please see discharge  summary for a list of discharge medications.  Relevant Imaging Results:  Relevant Lab Results:   Additional Information SS#: [id number]. Has two JP drains.  Margarito Liner, LCSW

## 2019-12-16 NOTE — Progress Notes (Signed)
ID  Date of Admission:11/09/2019  11/18/19 laparotomy Antibiotic Meropenem 9/9>10/4 anidulaungin 9/9 >9/17 Fluconazole 11/27/19>>12/07/19 Linezolid 12/11/19> Ceftriaxone 10/5  Culture8/30 BC -NG 9/3 intraabdominal abscess culture e.coli,  Streptococcus group C 11/27/19 -RT Upper quadrant abscess culture- lactobacillus and candida albicans 9/29 JP drain fluid- efeacium, e.coli  Foley -11/18/19>>12/14/19 PICC Rt arm 11/19/19>> Rt upper quadrant drain -11/27/19>>12/09/19 Rt LQ drain 11/18/19>> Left LQ drain 12/07/19>> Rectal tube  subjective Pt says she participated I PT, stood up O/e awake and alert Patient Vitals for the past 24 hrs:  BP Temp Temp src Pulse Resp SpO2  12/16/19 0904 (!) 139/101 (!) 97.5 F (36.4 C) Oral 97 (!) 21 98 %  12/16/19 0409 (!) 123/93 97.7 F (36.5 C) Oral 98 18 97 %  12/15/19 2100 140/85 99 F (37.2 C) Oral (!) 106 20 96 %  12/15/19 1627 132/69 98.8 F (37.1 C) Oral (!) 110 14 97 %  12/15/19 1623 132/69 98.8 F (37.1 C) Oral (!) 110 14 97 %  12/15/19 1354 116/81 99.2 F (37.3 C) Oral (!) 106 20 98 %  12/15/19 1324 128/90 99.2 F (37.3 C) Oral (!) 108 20 100 %  12/15/19 1150 135/62 99.1 F (37.3 C) Oral (!) 104 16 97 %    Chest b/l air entry Decreased bases Hs- tachycardia Abd- obese- -  RT LQ, LLQ purewick  Labs CBC Latest Ref Rng & Units 12/16/2019 12/15/2019 12/14/2019  WBC 4.0 - 10.5 K/uL 8.1 8.5 8.8  Hemoglobin 12.0 - 15.0 g/dL 1.9(F) 7.3(L) 7.4(L)  Hematocrit 36 - 46 % 26.0(L) 24.3(L) 23.9(L)  Platelets 150 - 400 K/uL 295 299 320    CMP Latest Ref Rng & Units 12/15/2019 12/14/2019 12/12/2019  Glucose 70 - 99 mg/dL 790(W) 409(B) 353(G)  BUN 6 - 20 mg/dL 17 17 19   Creatinine 0.44 - 1.00 mg/dL 9.92 4.26  Sodium 135 - 145 mmol/L 133(L) 135 134(L)  Potassium 3.5 - 5.1 mmol/L 3.9 4.0 3.9  Chloride 98 - 111 mmol/L 96(L) 97(L) 98  CO2 22 - 32 mmol/L 29 32 28  Calcium 8.9 - 10.3 mg/dL 8.0(L) 8.0(L) 8.5(L)  Total Protein 6.5 - 8.1  g/dL - 7.9 -  Total Bilirubin 0.3 - 1.2 mg/dL - 0.5 -  Alkaline Phos 38 - 126 U/L - 144(H) -  AST 15 - 41 U/L - 30 -  ALT 0 - 44 U/L - 79(H) -    Impression/recommendation Perforated diverticulitis with extensive intra-abdominal loop abscesses-admitted 11/09/19 Status post laparoscopy and washout.on 11/18/19 CT abdomen done9/16shows an abscess along rtlateral margin liverand organizing abscesses central abdominal mesentry - . IRplaced another drain9/17.Culturelactobacillus and rare candida albicans -on meropenem  CT abdomenwas donea new drain placed on 12/07/19 repeat culture enterococcus faecium and e.coli  added linezolid on 10/1 ( but not VRE) It is not going to be possible to treat the stool organisms which are likely  leaking intrabdominally thru Perforation/fistula. This is causing ongoing low level cytokine response inspite of antibiotic. she has developed MDRO . Tight source control is the key As WBC nromalized with linezolid which was started on 10/1 continue until 12/18/19 On  ceftriaxone until 10/8 and then stop both antibiotics and observe  Chronic foley since 9/8-removed on 12/14/19- has purewick and passing urine well  Anemia-she has been persistently tachycardic and unable to work with PT for long because of tachycardia and dizziness received a unit of PRBC - HB improved to 8.4 from 7.3 HR < 100 at rest   Morbid obesity  She needs to consistently participate in PT, do incentive spirometry, sit up in bed/chair Discussed with patient and her mother

## 2019-12-16 NOTE — Progress Notes (Signed)
Mobility Specialist - Progress Note   12/16/19 1435  Mobility  Activity Refused mobility  Mobility performed by Mobility specialist    Pt laying in bed upon arrival w/ mom present in room. Pt refused session this time d/t the nurse giving her dilaudid. Pt educated on the importance of mobilizing as much as possible. Pt shows understanding. Pt states she prefers to be seen early in the morning, when her pain isn't severe. States she typically feels better in the morning. Mom suggests pt be seen early in the morning and again later in the afternoon. Nurse and PT notified.      Amanda Reyes Mobility Specialist  12/16/19, 2:40 PM

## 2019-12-16 NOTE — Progress Notes (Signed)
PROGRESS NOTE    Amanda Reyes  YIF:[id number] DOB: 1985-08-26 DOA: 11/09/2019 PCP: Patient, No Pcp Per    Brief Narrative:  Amanda Reyes is a 34 y.o. female status post operative drainage of intra-abdominal abscesses. No evidence of perforation or frank stool. Postoperative course complicated by hypoxic respiratory failure requiring intubation. Now extubated. Course continues to be complicated by recurrent fever requiring repeat drain placement and continued IV antibiotics. Currently on TPN. Significant physical debility with likely SNF discharge.    Assessment & Plan:   Active Problems:   Diverticulitis of colon with perforation   Diverticulitis of large intestine with perforation and abscess   Acute respiratory failure with hypoxia and hypercapnia (HCC)   Essential hypertension   Pressure injury of skin   Intra-abdominal abscesses Patient is status post laparoscopic lysis of adhesions and drainage of abscesses.  Patient also had drain placement on 9/8 upsized on 9/17 in addition to the drain placed on 9/27 for chronic fevers and LLQ abscess.  Patient was previously on ciprofloxacin and flagyl, transitioned to  meropenem and anidulafungin -> fluconazole for treatment of infection/abscess continued E. coli with evidence of few lactobacillus species and rare Candida albicans.  Fluconazole was eventually discontinued. Drain culture (9/28) shows few enterococcus faecium which is sensitive to Vancomycin; patient started on Linezolid. LLQ drain continues to drain feculent appearing fluid. Leukocytosis improving. ID is following.She was on Meropenem, Linezolid.  Currently she is just on ceftriaxone and linezolid.  Plan to continue with these till 10/8 and then stop antibiotics and monitor.  Recurrent fever Secondary to above.  Resolved. Currently is afebrile but with recurrent high grade temperatures which have no improved.  Acute respiratory failure with hypoxia and  hypercapnia Patient required intubation and mechanical ventilation post surgery. Weaned to room air.  Essential hypertension Better controlled on hydrochlorothiazide. Patient is on losartan-hydrochlorothiazide as an outpatient. Continue HCTZ 25 mg daily  Microcytic anemia Likely iron deficient combined with acute illness,blood loss from surgery. She was  Transfused with a  1 unit of PRBC on 10/5 /21.  We will continue to monitor H&H.  We will check iron studies.  If low on iron, will transfuse IV iron.  Tachycardia Patient has had persistent tachycardia this admission.  Likely in setting of infection/pain. Currently  hemodynamically unstable.  Continue to monitor   Malnutrition/Morbid obesity Currently on TPN secondary to above. Also with evidence of hypoalbuminemia last albumin of 1.6.  Dietitian following.  Patient is currently advanced to a regular diet. -General surgery recommendations for TPN/diet  Transaminitis Possibly secondary to TPN.  Currently stable.  History of cholecystectomy.  Last CT scan without evidence of biliary obstruction.  Previous hepatic fluid collection almost completely resolved on last CT (9/25). AST/ALT trended down  AKI Resolved.  Pre-diabetes Patient does not appear to have a diagnosis of diabetes.  Last hemoglobin A1c of 5.7%.  Complicated by significant obesity.  Patient will benefit from Metformin use on discharge as she likely has a significant amount of insulin resistance.  Patient is currently managed on sliding scale insulin while inpatient. -Continue SSI   Nutrition Problem: Inadequate oral intake Etiology: inability to eat      DVT prophylaxis: Lovenox Code Status: Full code Family Communication: Mother at bedside Status is: Inpatient     Antimicrobials:  Anti-infectives (From admission, onward)   Start     Dose/Rate Route Frequency Ordered Stop   12/15/19 0800  cefTRIAXone (ROCEPHIN) 2 g in sodium chloride 0.9 % 100 mL IVPB  2 g 200 mL/hr over 30 Minutes Intravenous Every 24 hours 12/14/19 1558     12/11/19 2200  linezolid (ZYVOX) IVPB 600 mg        600 mg 300 mL/hr over 60 Minutes Intravenous Every 12 hours 12/11/19 1609     12/03/19 2000  fluconazole (DIFLUCAN) IVPB 400 mg  Status:  Discontinued       "And" Linked Group Details   400 mg 100 mL/hr over 120 Minutes Intravenous Every 24 hours 12/03/19 1657 12/07/19 1142   12/03/19 1800  fluconazole (DIFLUCAN) IVPB 400 mg  Status:  Discontinued       "And" Linked Group Details   400 mg 100 mL/hr over 120 Minutes Intravenous Every 24 hours 12/03/19 1657 12/07/19 1143   11/30/19 2200  meropenem (MERREM) 1 g in sodium chloride 0.9 % 100 mL IVPB        1 g 200 mL/hr over 30 Minutes Intravenous Every 8 hours 11/30/19 1531 12/15/19 0559   11/28/19 1600  fluconazole (DIFLUCAN) IVPB 800 mg  Status:  Discontinued        800 mg 100 mL/hr over 240 Minutes Intravenous Every 24 hours 11/27/19 1642 12/03/19 1657   11/20/19 1600  anidulafungin (ERAXIS) 100 mg in sodium chloride 0.9 % 100 mL IVPB  Status:  Discontinued        100 mg 78 mL/hr over 100 Minutes Intravenous Every 24 hours 11/20/19 1227 11/27/19 1642   11/19/19 1400  anidulafungin (ERAXIS) 200 mg in sodium chloride 0.9 % 200 mL IVPB       Note to Pharmacy: Please place order for  tomorrow's dose   200 mg 78 mL/hr over 200 Minutes Intravenous  Once 11/19/19 1311 11/19/19 1714   11/19/19 1400  meropenem (MERREM) 1 g in sodium chloride 0.9 % 100 mL IVPB  Status:  Discontinued        1 g 200 mL/hr over 30 Minutes Intravenous Every 8 hours 11/19/19 1320 11/30/19 1325   11/09/19 2200  metroNIDAZOLE (FLAGYL) IVPB 500 mg  Status:  Discontinued       "And" Linked Group Details   500 mg 100 mL/hr over 60 Minutes Intravenous Every 8 hours 11/09/19 1810 11/19/19 1311   11/09/19 2000  ciprofloxacin (CIPRO) IVPB 400 mg  Status:  Discontinued       "And" Linked Group Details   400 mg 200 mL/hr over 60 Minutes  Intravenous Every 12 hours 11/09/19 1810 11/19/19 1311   11/09/19 1815  metroNIDAZOLE (FLAGYL) IVPB 500 mg        500 mg 100 mL/hr over 60 Minutes Intravenous  Once 11/09/19 1800 11/09/19 2317   11/09/19 1815  ciprofloxacin (CIPRO) IVPB 400 mg        400 mg 200 mL/hr over 60 Minutes Intravenous  Once 11/09/19 1800 11/09/19 1946      Subjective: Patient seen and examined the bedside this morning.  Hemodynamically stable.  Comfortable during my evaluation.  Denies any complaints.  Mother was concerned about the pressure ulcer on her buttocks  Objective: Vitals:   12/15/19 2100 12/16/19 0409 12/16/19 0904 12/16/19 1158  BP: 140/85 (!) 123/93 (!) 139/101 131/89  Pulse: (!) 106 98 97 (!) 106  Resp: 20 18 (!) 21 20  Temp: 99 F (37.2 C) 97.7 F (36.5 C) (!) 97.5 F (36.4 C) 98.5 F (36.9 C)  TempSrc: Oral Oral Oral Oral  SpO2: 96% 97% 98% 94%  Weight:      Height:  Intake/Output Summary (Last 24 hours) at 12/16/2019 1336 Last data filed at 12/16/2019 0400 Gross per 24 hour  Intake 312 ml  Output 1975 ml  Net -1663 ml   Filed Weights   12/03/19 0423 12/05/19 0438 12/07/19 0454  Weight: (!) 198 kg (!) 224.5 kg (!) 220.4 kg    Examination:  General exam: Appears calm and comfortable ,Not in distress, morbidly obese HEENT:PERRL,Oral mucosa moist, Ear/Nose normal on gross exam Respiratory system: Bilateral equal air entry, normal vesicular breath sounds, no wheezes or crackles  Cardiovascular system: S1 & S2 heard, RRR. No JVD, murmurs, rubs, gallops or clicks. No pedal edema. Gastrointestinal system: Abdomen is obese, soft, clean surgical scars, right lower quadrant drain with scant collection Central nervous system: Alert and oriented.  Extremities: No edema, no clubbing ,no cyanosis. Skin: No rashes, lesions or ulcers,no icterus ,no pallor   Data Reviewed: I have personally reviewed following labs and imaging studies  CBC: Recent Labs  Lab 12/10/19 0440  12/11/19 0810 12/12/19 0618 12/13/19 0521 12/14/19 0530 12/15/19 0407 12/16/19 0425  WBC 12.2*   < > 9.3 9.0 8.8 8.5 8.1  NEUTROABS 8.7*  --   --   --  6.0  --   --   HGB 7.5*   < > 7.3* 7.4* 7.4* 7.3* 8.4*  HCT 23.1*   < > 23.5* 23.0* 23.9* 24.3* 26.0*  MCV 75.2*   < > 77.6* 75.7* 77.3* 78.9* 76.0*  PLT 282   < > 294 290 320 299 295   < > = values in this interval not displayed.   Basic Metabolic Panel: Recent Labs  Lab 12/10/19 0440 12/10/19 0440 12/11/19 0613 12/12/19 0618 12/13/19 0521 12/14/19 0530 12/15/19 0407  NA 134*  --  133* 134*  --  135 133*  K 3.9  --  4.1 3.9  --  4.0 3.9  CL 99  --  98 98  --  97* 96*  CO2 27  --  27 28  --  32 29  GLUCOSE 147*  --  203* 127*  --  131* 134*  BUN 19  --  22* 19  --  17 17  CREATININE 0.55  --  0.65 0.54  --  0.44 0.50  CALCIUM 7.7*  --  8.1* 8.5*  --  8.0* 8.0*  MG 1.8   < > 1.8 1.6* 1.6* 1.6* 1.9  PHOS 3.6  --   --  4.5  --  4.6  --    < > = values in this interval not displayed.   GFR: Estimated Creatinine Clearance: 202 mL/min (by C-G formula based on SCr of 0.5 mg/dL). Liver Function Tests: Recent Labs  Lab 12/10/19 0440 12/12/19 0618 12/14/19 0530  AST 44*  --  30  ALT 103*  --  79*  ALKPHOS 193*  --  144*  BILITOT 0.4  --  0.5  PROT 8.1  --  7.9  ALBUMIN 1.5* 1.5* 1.6*   No results for input(s): LIPASE, AMYLASE in the last 168 hours. No results for input(s): AMMONIA in the last 168 hours. Coagulation Profile: No results for input(s): INR, PROTIME in the last 168 hours. Cardiac Enzymes: No results for input(s): CKTOTAL, CKMB, CKMBINDEX, TROPONINI in the last 168 hours. BNP (last 3 results) No results for input(s): PROBNP in the last 8760 hours. HbA1C: No results for input(s): HGBA1C in the last 72 hours. CBG: Recent Labs  Lab 12/14/19 1641 12/15/19 0026 12/15/19 [id number] 12/15/19 1146 12/15/19 1625  GLUCAP 141* 132* 137* 133* 117*   Lipid Profile: Recent Labs    12/14/19 0500  TRIG 244*    Thyroid Function Tests: No results for input(s): TSH, T4TOTAL, FREET4, T3FREE, THYROIDAB in the last 72 hours. Anemia Panel: No results for input(s): VITAMINB12, FOLATE, FERRITIN, TIBC, IRON, RETICCTPCT in the last 72 hours. Sepsis Labs: Recent Labs  Lab 12/11/19 0613 12/12/19 0618  PROCALCITON 0.30 0.17    Recent Results (from the past 240 hour(s))  Body fluid culture     Status: None   Collection Time: 12/08/19  9:56 PM   Specimen: JP Drain; Body Fluid  Result Value Ref Range Status   Specimen Description   Final    JP DRAINAGE Performed at [facility], [address], [place], [place] [address]    Special Requests   Final    NONE Performed at [facility], [address] [place], [place] [address]    Gram Stain   Final    ABUNDANT WBC PRESENT, PREDOMINANTLY PMN RARE GRAM POSITIVE COCCI IN PAIRS Performed at [facility], [address], [place], [place] [address]    Culture FEW ESCHERICHIA COLI FEW ENTEROCOCCUS FAECIUM   Final   Report Status 12/12/2019 FINAL  Final   Organism ID, Bacteria ESCHERICHIA COLI  Final   Organism ID, Bacteria ENTEROCOCCUS FAECIUM  Final      Susceptibility   Escherichia coli - MIC*    AMPICILLIN >=32 RESISTANT Resistant     CEFAZOLIN <=4 SENSITIVE Sensitive     CEFEPIME <=0.12 SENSITIVE Sensitive     CEFTAZIDIME <=1 SENSITIVE Sensitive     CEFTRIAXONE <=0.25 SENSITIVE Sensitive     CIPROFLOXACIN >=4 RESISTANT Resistant     GENTAMICIN >=16 RESISTANT Resistant     IMIPENEM <=0.25 SENSITIVE Sensitive     TRIMETH/SULFA >=320 RESISTANT Resistant     AMPICILLIN/SULBACTAM >=32 RESISTANT Resistant     PIP/TAZO <=4 SENSITIVE Sensitive     * FEW ESCHERICHIA COLI   Enterococcus faecium - MIC*    AMPICILLIN <=2 SENSITIVE Sensitive     VANCOMYCIN <=0.5 SENSITIVE Sensitive     GENTAMICIN SYNERGY SENSITIVE Sensitive     * FEW ENTEROCOCCUS FAECIUM         Radiology Studies: No results  found.      Scheduled Meds: . amLODipine  10 mg Oral Daily  . Chlorhexidine Gluconate Cloth  6 each Topical Q0600  . enoxaparin (LOVENOX) injection  40 mg Subcutaneous Q12H  . feeding supplement (ENSURE ENLIVE)  237 mL Oral TID BM  . hydrochlorothiazide  25 mg Oral Daily  . mouth rinse  15 mL Mouth Rinse q12n4p  . pantoprazole (PROTONIX) IV  40 mg Intravenous Q12H  . sodium chloride flush  10-40 mL Intracatheter Q12H  . sodium chloride flush  5 mL Intracatheter Q8H  . sodium chloride flush  5 mL Intracatheter Q8H   Continuous Infusions: . sodium chloride Stopped (12/12/19 0945)  . cefTRIAXone (ROCEPHIN)  IV 2 g (12/16/19 0804)  . linezolid (ZYVOX) IV 600 mg (12/16/19 0910)     LOS: 37 days    Time spent: 25 mins.More than 50% of that time was spent in counseling and/or coordination of care.      Burnadette Pop, MD [facility] P10/08/2019, 1:36 PM

## 2019-12-17 DIAGNOSIS — K5720 Diverticulitis of large intestine with perforation and abscess without bleeding: Secondary | ICD-10-CM | POA: Diagnosis not present

## 2019-12-17 LAB — CBC WITH DIFFERENTIAL/PLATELET
Abs Immature Granulocytes: 0.07 10*3/uL (ref 0.00–0.07)
Basophils Absolute: 0 10*3/uL (ref 0.0–0.1)
Basophils Relative: 0 %
Eosinophils Absolute: 0.2 10*3/uL (ref 0.0–0.5)
Eosinophils Relative: 3 %
HCT: 27.4 % — ABNORMAL LOW (ref 36.0–46.0)
Hemoglobin: 8.6 g/dL — ABNORMAL LOW (ref 12.0–15.0)
Immature Granulocytes: 1 %
Lymphocytes Relative: 22 %
Lymphs Abs: 1.7 10*3/uL (ref 0.7–4.0)
MCH: 24.7 pg — ABNORMAL LOW (ref 26.0–34.0)
MCHC: 31.4 g/dL (ref 30.0–36.0)
MCV: 78.7 fL — ABNORMAL LOW (ref 80.0–100.0)
Monocytes Absolute: 0.5 10*3/uL (ref 0.1–1.0)
Monocytes Relative: 6 %
Neutro Abs: 5.1 10*3/uL (ref 1.7–7.7)
Neutrophils Relative %: 68 %
Platelets: 298 10*3/uL (ref 150–400)
RBC: 3.48 MIL/uL — ABNORMAL LOW (ref 3.87–5.11)
RDW: 20.7 % — ABNORMAL HIGH (ref 11.5–15.5)
WBC: 7.5 10*3/uL (ref 4.0–10.5)
nRBC: 0 % (ref 0.0–0.2)

## 2019-12-17 LAB — BASIC METABOLIC PANEL
Anion gap: 11 (ref 5–15)
BUN: 15 mg/dL (ref 6–20)
CO2: 28 mmol/L (ref 22–32)
Calcium: 8.2 mg/dL — ABNORMAL LOW (ref 8.9–10.3)
Chloride: 97 mmol/L — ABNORMAL LOW (ref 98–111)
Creatinine, Ser: 0.52 mg/dL (ref 0.44–1.00)
GFR calc non Af Amer: >60 mL/min (ref >60)
Glucose, Bld: 104 mg/dL — ABNORMAL HIGH (ref 70–99)
Potassium: 3.9 mmol/L (ref 3.5–5.1)
Sodium: 136 mmol/L (ref 135–145)

## 2019-12-17 LAB — IRON AND TIBC
Iron: 38 ug/dL (ref 28–170)
Saturation Ratios: 15 % (ref 10.4–31.8)
TIBC: 252 ug/dL (ref 250–450)
UIBC: 214 ug/dL

## 2019-12-17 NOTE — Progress Notes (Signed)
Patient got out of bed twice today. She walked to the door and back this am. Mobility team got her out of bed this afternoon and  did some steps to the side of  the bed. Patient agreed to start using the bedside commode tomorrow with urination.We can remove the rectal tube once she's train to use the [facility] hopefully by tomorrow.

## 2019-12-17 NOTE — TOC Progression Note (Signed)
Transition of Care [facility]) - Progression Note    Patient Details  Name: Amanda Reyes MRN: [id number] Date of Birth: 1985/06/16  [facility] CM/SW Contact  Chapman Fitch, RN Phone Number: 12/17/2019, 9:40 AM  Clinical Narrative:     No Bariatric bed offers at this time.  I have reached out to [facility] leadership for assistance        Expected Discharge Plan and Services                                                 Social Determinants of Health (SDOH) Interventions    Readmission Risk Interventions No flowsheet data found.

## 2019-12-17 NOTE — Progress Notes (Signed)
Mobility Specialist - Progress Note   12/17/19 1435  Mobility  Activity Stood at bedside;Dangled on edge of bed  Level of Assistance Minimal assist, patient does 75% or more (+2 assist)  Assistive Device Front wheel walker  Mobility Response Tolerated well  Mobility performed by Mobility specialist  $Mobility charge 1 Mobility    Pt laying in bed w/ mom present in room upon arrival. Pt agreed to session. Pt, initially, hesitant on getting OOB. Pt was hoping to perform bed-level exercises. With lots of encouragement from mobility specialist and mom, pt agreed to get OOB. Pt transitioned from supine to sit EOB w/ min to mod. Assist, +2 for safety, as well as elevating HOB. Pt able to scoot to EOB SBA. Pt dangled EOB for a couple of minutes. Pt progressed to standing at bedside using a RW w/ min assist, +2 for safety. Pt able to stand for ~3 minutes before complaining about abdominal pain. Further mobility limited d/t abdominal pain. Pt took 2 R steps along bedside before sitting down. Pt transitioned from sit to supine w/ max assist +1. Pt states laying down helped resolve some of the pain, requested for pain med. Nurse was notified. Overall, pt tolerated session well. Pt left laying in bed w/ mom present in room. All needs placed in reach.     Sholom Dulude Mobility Specialist  12/17/19, 2:46 PM

## 2019-12-17 NOTE — Progress Notes (Signed)
Referring Physician(s): Dr. Claudine Mouton  Supervising Physician: Gilmer Mor  Patient Status:  [facility] - In-pt  Chief Complaint: Diverticulitis with abscess  Subjective: 8/30: admitted with diverticulitis with abscess.  9/3: Underwent CT-guided drain placement by Dr. Fredia Sorrow (removed 9/22) 9/8: Robotic assisted laparoscopic lysis of adhesion and drainage of intra-abdominal abscess WITHOUT evidence of perforation or frank stoolforperforated diverticulitis. 1 surgical drain left in place.  9/17: RUQ drain placement by Dr. Miles Costain (removed 9/29) 9/27: LLQ drain placement by Dr. Fredia Sorrow  Patient working with PT at time of visit.   States drain site occasionally sore/painful, but otherwise feeling ok.  No complaints at this time.   Allergies: Penicillins and Tomato  Medications: Prior to Admission medications   Medication Sig Start Date End Date Taking? Authorizing Provider  losartan-hydrochlorothiazide (HYZAAR) 100-25 MG tablet Take 1 tablet by mouth daily. 03/26/19 03/25/20 Yes Willy Eddy, MD  azithromycin (ZITHROMAX Z-PAK) 250 MG tablet Take 2 tablets (500 mg) on  Day 1,  followed by 1 tablet (250 mg) once daily on Days 2 through 5. Patient not taking: Reported on 11/09/2019 12/06/17   Bridget Hartshorn L, PA-C  ondansetron (ZOFRAN) 4 MG tablet Take 1 tablet (4 mg total) by mouth daily as needed. Patient not taking: Reported on 11/09/2019 03/26/19 03/25/20  Willy Eddy, MD     Vital Signs: BP (!) 109/59 (BP Location: Left Arm)   Pulse (!) 109   Temp 98.6 F (37 C) (Oral)   Resp 19   Ht 5\' 9"  (1.753 m)   Wt (!) 486 lb (220.4 kg)   LMP 11/11/2019   SpO2 95%   BMI 71.77 kg/m   Physical Exam  Abdomen: soft, non-tender.  Surgical RLQ drain in place with purulent/pink outout.  IR LLQ drain in place with thin, feculent-appearing fluid. Output 40 mL overnight. Dressing clean and dry.   Imaging: No results found.  Labs:  CBC: Recent Labs    12/14/19 0530  12/15/19 0407 12/16/19 0425 12/17/19 0626  WBC 8.8 8.5 8.1 7.5  HGB 7.4* 7.3* 8.4* 8.6*  HCT 23.9* 24.3* 26.0* 27.4*  PLT 320 299 295 298    COAGS: Recent Labs    11/09/19 1758  INR 1.1  APTT 30    BMP: Recent Labs    12/11/19 0613 12/11/19 0613 12/12/19 0618 12/14/19 0530 12/15/19 0407 12/17/19 0626  NA 133*   < > 134* 135 133* 136  K 4.1   < > 3.9 4.0 3.9 3.9  CL 98   < > 98 97* 96* 97*  CO2 27   < > 28 32 29 28  GLUCOSE 203*   < > 127* 131* 134* 104*  BUN 22*   < > 19 17 17 15   CALCIUM 8.1*   < > 8.5* 8.0* 8.0* 8.2*  CREATININE 0.65   < > 0.54 0.44 0.50 0.52  GFRNONAA >60   < > >60 >60 >60 >60  GFRAA >60  --  >60 >60 >60  --    < > = values in this interval not displayed.    LIVER FUNCTION TESTS: Recent Labs    12/04/19 0944 12/04/19 0944 12/07/19 0501 12/07/19 0501 12/09/19 0537 12/10/19 0440 12/12/19 0618 12/14/19 0530  BILITOT 0.5  --  0.5  --   --  0.4  --  0.5  AST 38  --  42*  --   --  44*  --  30  ALT 72*  --  74*  --   --  103*  --  79*  ALKPHOS 164*  --  190*  --   --  193*  --  144*  PROT 7.8  --  8.0  --   --  8.1  --  7.9  ALBUMIN 1.4*   < > 1.4*   < > 1.4* 1.5* 1.5* 1.6*   < > = values in this interval not displayed.    Assessment and Plan: Diverticulitis with abscess s/p laparoscopic lysis of adhesions, intra-operative abscess drainage 9/8, surgical RLQ drain in place S/p drain placement 9/3 (removed), 9/17 (removed), and 9/27 to LLQ RLQ surgical drain and LLQ IR drain remain in place.  RLQ drain with purulent-appearing output.  IR drain with feculent/dark output.   Afebrile. WBC improved to 7.5 Completed several abx courses.   Drains to remain in place at this time.  Working with therapies.  Continue current drain management.   IR following.  Will place order to arrange outpatient follow-up as patient may be nearing discharge.   Electronically Signed: Hoyt Koch, PA 12/17/2019, 12:39 PM   I spent a total  of 15 Minutes at the the patient's bedside AND on the patient's hospital floor or unit, greater than 50% of which was counseling/coordinating care for diverticulitis with abscess.

## 2019-12-17 NOTE — Progress Notes (Signed)
PROGRESS NOTE    Amanda Reyes  ZOX:[id number] DOB: 06/25/1985 DOA: 11/09/2019 PCP: Patient, No Pcp Per    Brief Narrative:  Amanda Reyes is a 34 y.o. female status post operative drainage of intra-abdominal abscesses.  Postoperative course complicated by hypoxic respiratory failure requiring intubation. Now extubated.  Hospital course also remarkable for recurrent fever requiring repeat drain placement and continued IV antibiotics. Currently on TPN. Significant physical debility with likely SNF discharge when stable.  General surgery are the primary and we were consulting.   Assessment & Plan:   Active Problems:   Diverticulitis of colon with perforation   Diverticulitis of large intestine with perforation and abscess   Acute respiratory failure with hypoxia and hypercapnia (HCC)   Essential hypertension   Pressure injury of skin   Intra-abdominal abscesses Patient is status post laparoscopic lysis of adhesions and drainage of abscesses.  Patient also had drain placement on 9/8 upsized on 9/17 in addition to the drain placed on 9/27 for chronic fevers and LLQ abscess.  Patient was previously on ciprofloxacin and flagyl, transitioned to  meropenem and anidulafungin -> fluconazole for treatment of infection/abscess continued E. coli with evidence of few lactobacillus species and rare Candida albicans.  Fluconazole was eventually discontinued. Drain culture (9/28) shows few enterococcus faecium which is sensitive to Vancomycin; patient started on Linezolid.  Patient has drains on right lower quadrant and left lower quadrant.  Leukocytosis resolved. ID is following.She was on Meropenem, Linezolid.  Currently she is just on ceftriaxone and linezolid.  Plan to continue with these till 10/8 and then stop antibiotics and monitor.  Recurrent fever Secondary to above.  Resolved.  Acute respiratory failure with hypoxia and hypercapnia Patient required intubation and mechanical ventilation  post surgery. Weaned to room air.  Respiratory status is currently stable.  Essential hypertension Better controlled on hydrochlorothiazide. Patient is on losartan-hydrochlorothiazide as an outpatient.  Microcytic anemia Likely iron deficient combined with acute illness,blood loss from surgery. She was  Transfused with a  1 unit of PRBC on 10/5 /21.  We will continue to monitor H&H  Tachycardia Patient has had persistent tachycardia this admission.  Likely in setting of infection/pain. Currently  hemodynamically stable.  Continue to monitor   Malnutrition/Morbid obesity Currently on TPN secondary to above. Also with evidence of hypoalbuminemia last albumin of 1.6.  Dietitian following.  Patient is currently advanced to a regular diet.  Transaminitis Possibly secondary to TPN.  Currently stable.  History of cholecystectomy.  Last CT scan without evidence of biliary obstruction.  Previous hepatic fluid collection almost completely resolved on last CT (9/25). AST/ALT trended down  AKI Resolved.  Pre-diabetes Patient does not appear to have a diagnosis of diabetes.  Last hemoglobin A1c of 5.7%.    Patient is currently managed on sliding scale insulin while inpatient. -Continue SSI   Medicine will sign off.  Thank you for providing opportunity to take care of this patient.  Please call anytime without hesitation.   Nutrition Problem: Inadequate oral intake Etiology: inability to eat      DVT prophylaxis: Lovenox Code Status: Full code Family Communication: Mother at bedside Status is: Inpatient     Antimicrobials:  Anti-infectives (From admission, onward)   Start     Dose/Rate Route Frequency Ordered Stop   12/15/19 0800  cefTRIAXone (ROCEPHIN) 2 g in sodium chloride 0.9 % 100 mL IVPB        2 g 200 mL/hr over 30 Minutes Intravenous Every 24 hours 12/14/19 1558  12/11/19 2200  linezolid (ZYVOX) IVPB 600 mg        600 mg 300 mL/hr over 60 Minutes Intravenous  Every 12 hours 12/11/19 1609     12/03/19 2000  fluconazole (DIFLUCAN) IVPB 400 mg  Status:  Discontinued       "And" Linked Group Details   400 mg 100 mL/hr over 120 Minutes Intravenous Every 24 hours 12/03/19 1657 12/07/19 1142   12/03/19 1800  fluconazole (DIFLUCAN) IVPB 400 mg  Status:  Discontinued       "And" Linked Group Details   400 mg 100 mL/hr over 120 Minutes Intravenous Every 24 hours 12/03/19 1657 12/07/19 1143   11/30/19 2200  meropenem (MERREM) 1 g in sodium chloride 0.9 % 100 mL IVPB        1 g 200 mL/hr over 30 Minutes Intravenous Every 8 hours 11/30/19 1531 12/15/19 0559   11/28/19 1600  fluconazole (DIFLUCAN) IVPB 800 mg  Status:  Discontinued        800 mg 100 mL/hr over 240 Minutes Intravenous Every 24 hours 11/27/19 1642 12/03/19 1657   11/20/19 1600  anidulafungin (ERAXIS) 100 mg in sodium chloride 0.9 % 100 mL IVPB  Status:  Discontinued        100 mg 78 mL/hr over 100 Minutes Intravenous Every 24 hours 11/20/19 1227 11/27/19 1642   11/19/19 1400  anidulafungin (ERAXIS) 200 mg in sodium chloride 0.9 % 200 mL IVPB       Note to Pharmacy: Please place order for  tomorrow's dose   200 mg 78 mL/hr over 200 Minutes Intravenous  Once 11/19/19 1311 11/19/19 1714   11/19/19 1400  meropenem (MERREM) 1 g in sodium chloride 0.9 % 100 mL IVPB  Status:  Discontinued        1 g 200 mL/hr over 30 Minutes Intravenous Every 8 hours 11/19/19 1320 11/30/19 1325   11/09/19 2200  metroNIDAZOLE (FLAGYL) IVPB 500 mg  Status:  Discontinued       "And" Linked Group Details   500 mg 100 mL/hr over 60 Minutes Intravenous Every 8 hours 11/09/19 1810 11/19/19 1311   11/09/19 2000  ciprofloxacin (CIPRO) IVPB 400 mg  Status:  Discontinued       "And" Linked Group Details   400 mg 200 mL/hr over 60 Minutes Intravenous Every 12 hours 11/09/19 1810 11/19/19 1311   11/09/19 1815  metroNIDAZOLE (FLAGYL) IVPB 500 mg        500 mg 100 mL/hr over 60 Minutes Intravenous  Once 11/09/19 1800  11/09/19 2317   11/09/19 1815  ciprofloxacin (CIPRO) IVPB 400 mg        400 mg 200 mL/hr over 60 Minutes Intravenous  Once 11/09/19 1800 11/09/19 1946      Subjective: Patient seen and examined at the bedside this morning.  Hemodynamically stable.  Comfortable.  Sitting in the chair today.  Denies any nausea, vomiting or abdominal pain.tolerating oral diet  Objective: Vitals:   12/16/19 1954 12/17/19 0038 12/17/19 0400 12/17/19 0718  BP: (!) 111/52 118/89 122/77 120/86  Pulse: (!) 107 (!) 103 (!) 101 98  Resp: 18 18 18 19   Temp: 99.1 F (37.3 C) 98.3 F (36.8 C) 98.4 F (36.9 C) 98.6 F (37 C)  TempSrc: Oral Oral Oral Oral  SpO2: 94% 95% 95% 94%  Weight:      Height:        Intake/Output Summary (Last 24 hours) at 12/17/2019 0810 Last data filed at 12/17/2019 0358 Gross per  24 hour  Intake 1850 ml  Output 2850 ml  Net -1000 ml   Filed Weights   12/03/19 0423 12/05/19 0438 12/07/19 0454  Weight: (!) 198 kg (!) 224.5 kg (!) 220.4 kg    Examination:  General exam: Appears calm and comfortable ,Not in distress, morbidly obese HEENT:PERRL,Oral mucosa moist, Ear/Nose normal on gross exam Respiratory system: Bilateral equal air entry, normal vesicular breath sounds, no wheezes or crackles  Cardiovascular system: S1 & S2 heard, RRR. No JVD, murmurs, rubs, gallops or clicks. No pedal edema. Gastrointestinal system: Abdomen is obese, soft, clean surgical scars, right and left lower quadrant drain .  Left lower quadrant drain with dark output Central nervous system: Alert and oriented.  Extremities: No edema, no clubbing ,no cyanosis. Skin: No icterus or pallor  Pressure Injury 12/14/19 Medial Stage 2 -  Partial thickness loss of dermis presenting as a shallow open injury with a red, pink wound bed without slough. (Active)  12/14/19 1814  Location:   Location Orientation: Medial  Staging: Stage 2 -  Partial thickness loss of dermis presenting as a shallow open injury with a  red, pink wound bed without slough.  Wound Description (Comments):   Present on Admission: No        Data Reviewed: I have personally reviewed following labs and imaging studies  CBC: Recent Labs  Lab 12/13/19 0521 12/14/19 0530 12/15/19 0407 12/16/19 0425 12/17/19 0626  WBC 9.0 8.8 8.5 8.1 7.5  NEUTROABS  --  6.0  --   --  5.1  HGB 7.4* 7.4* 7.3* 8.4* 8.6*  HCT 23.0* 23.9* 24.3* 26.0* 27.4*  MCV 75.7* 77.3* 78.9* 76.0* 78.7*  PLT 290 320 299 295 298   Basic Metabolic Panel: Recent Labs  Lab 12/11/19 0613 12/12/19 0618 12/13/19 0521 12/14/19 0530 12/15/19 0407 12/17/19 0626  NA 133* 134*  --  135 133* 136  K 4.1 3.9  --  4.0 3.9 3.9  CL 98 98  --  97* 96* 97*  CO2 27 28  --  32 29 28  GLUCOSE 203* 127*  --  131* 134* 104*  BUN 22* 19  --  17 17 15   CREATININE 0.65 0.54  --  0.44 0.50 0.52  CALCIUM 8.1* 8.5*  --  8.0* 8.0* 8.2*  MG 1.8 1.6* 1.6* 1.6* 1.9  --   PHOS  --  4.5  --  4.6  --   --    GFR: Estimated Creatinine Clearance: 202 mL/min (by C-G formula based on SCr of 0.52 mg/dL). Liver Function Tests: Recent Labs  Lab 12/12/19 0618 12/14/19 0530  AST  --  30  ALT  --  79*  ALKPHOS  --  144*  BILITOT  --  0.5  PROT  --  7.9  ALBUMIN 1.5* 1.6*   No results for input(s): LIPASE, AMYLASE in the last 168 hours. No results for input(s): AMMONIA in the last 168 hours. Coagulation Profile: No results for input(s): INR, PROTIME in the last 168 hours. Cardiac Enzymes: No results for input(s): CKTOTAL, CKMB, CKMBINDEX, TROPONINI in the last 168 hours. BNP (last 3 results) No results for input(s): PROBNP in the last 8760 hours. HbA1C: No results for input(s): HGBA1C in the last 72 hours. CBG: Recent Labs  Lab 12/14/19 1641 12/15/19 0026 12/15/19 0609 12/15/19 1146 12/15/19 1625  GLUCAP 141* 132* 137* 133* 117*   Lipid Profile: No results for input(s): CHOL, HDL, LDLCALC, TRIG, CHOLHDL, LDLDIRECT in the last 72 hours. Thyroid  Function  Tests: No results for input(s): TSH, T4TOTAL, FREET4, T3FREE, THYROIDAB in the last 72 hours. Anemia Panel: Recent Labs    12/17/19 0626  TIBC 252  IRON 38   Sepsis Labs: Recent Labs  Lab 12/11/19 0613 12/12/19 0618  PROCALCITON 0.30 0.17    Recent Results (from the past 240 hour(s))  Body fluid culture     Status: None   Collection Time: 12/08/19  9:56 PM   Specimen: JP Drain; Body Fluid  Result Value Ref Range Status   Specimen Description   Final    JP DRAINAGE Performed at [facility], [address] [place], [place] [address]    Special Requests   Final    NONE Performed at [facility], [address] [place], [place] [address]    Gram Stain   Final    ABUNDANT WBC PRESENT, PREDOMINANTLY PMN RARE GRAM POSITIVE COCCI IN PAIRS Performed at [facility], [address] [place], [place] [address]    Culture FEW ESCHERICHIA COLI FEW ENTEROCOCCUS FAECIUM   Final   Report Status 12/12/2019 FINAL  Final   Organism ID, Bacteria ESCHERICHIA COLI  Final   Organism ID, Bacteria ENTEROCOCCUS FAECIUM  Final      Susceptibility   Escherichia coli - MIC*    AMPICILLIN >=32 RESISTANT Resistant     CEFAZOLIN <=4 SENSITIVE Sensitive     CEFEPIME <=0.12 SENSITIVE Sensitive     CEFTAZIDIME <=1 SENSITIVE Sensitive     CEFTRIAXONE <=0.25 SENSITIVE Sensitive     CIPROFLOXACIN >=4 RESISTANT Resistant     GENTAMICIN >=16 RESISTANT Resistant     IMIPENEM <=0.25 SENSITIVE Sensitive     TRIMETH/SULFA >=320 RESISTANT Resistant     AMPICILLIN/SULBACTAM >=32 RESISTANT Resistant     PIP/TAZO <=4 SENSITIVE Sensitive     * FEW ESCHERICHIA COLI   Enterococcus faecium - MIC*    AMPICILLIN <=2 SENSITIVE Sensitive     VANCOMYCIN <=0.5 SENSITIVE Sensitive     GENTAMICIN SYNERGY SENSITIVE Sensitive     * FEW ENTEROCOCCUS FAECIUM         Radiology Studies: No results found.      Scheduled Meds: . amLODipine  10 mg Oral Daily  . Chlorhexidine  Gluconate Cloth  6 each Topical Q0600  . enoxaparin (LOVENOX) injection  40 mg Subcutaneous Q12H  . feeding supplement (ENSURE ENLIVE)  237 mL Oral TID BM  . hydrochlorothiazide  25 mg Oral Daily  . mouth rinse  15 mL Mouth Rinse q12n4p  . pantoprazole (PROTONIX) IV  40 mg Intravenous Q12H  . sodium chloride flush  10-40 mL Intracatheter Q12H  . sodium chloride flush  5 mL Intracatheter Q8H  . sodium chloride flush  5 mL Intracatheter Q8H   Continuous Infusions: . sodium chloride Stopped (12/12/19 0945)  . cefTRIAXone (ROCEPHIN)  IV Stopped (12/16/19 0834)  . linezolid (ZYVOX) IV 600 mg (12/16/19 2150)     LOS: 38 days    Time spent: 15 mins.More than 50% of that time was spent in counseling and/or coordination of care.      Burnadette Pop, MD [facility] P10/09/2019, 8:10 AM

## 2019-12-17 NOTE — Progress Notes (Signed)
[facility] SURGICAL PROGRESS NOTE  Hospital Day(s): 51.   Post op day(s): 29 Days Post-Op.   Interval History:  Patient seen and examined No acute events or new complaints overnight.  Patient reports she continues to feel a little better daily Only pain is intermittent pain at drain sites, PO medications helping, mostly Tramadol No fever, chills, CP, SOB, nausea, emesis Leukocytosis remains resolved x4 days Renal function remains normal, sCr - 0.52, UO - 2.7L Drain output as follows:  - RLQ:10ccs,this appears more purulent - LLQ:40ccs,appears morefeculent Meropenem stopped on 10/4, Ceftriaxone started 10/5 (Day 3/4), Linezolid added on 10/01 (Day 6/7); ID following Working with therapies; recommendations are SNF; she reports she has been getting to edge of bed and working on getting OOB/Standing  Vital signs in last 24 hours: [min-max] current  Temp:  [97.5 F (36.4 C)-99.1 F (37.3 C)] 98.6 F (37 C) (10/07 0718) Pulse Rate:  [97-107] 98 (10/07 0718) Resp:  [18-21] 19 (10/07 0718) BP: (111-139)/(52-101) 120/86 (10/07 0718) SpO2:  [94 %-98 %] 94 % (10/07 0718)     Height: 5\' 9"  (175.3 cm) Weight: (!) 220.4 kg BMI (Calculated): 71.74   Intake/Output last 2 shifts:  10/06 0701 - 10/07 0700 In: 1850 [P.O.:440; IV Piggyback:1400] Out: 2850 [Urine:2800; Drains:50]   Physical Exam:  Constitutional: alert, cooperative and no distress Respiratory:Breathing appears to be improving, on RA, less tachypneic, still with some intermittent conversational dyspnea but again this has been her baseline herein the hospital Cardiovascular:Regular rate and sinus rhythm  Gastrointestinal:Obese, soft,no appreciable tenderness aside from drains, non-distended. Rectal tube in place. RLQ drain with seropurulent output, LLQ drain with feculent output.  Musculoskeletal: 1+ edema to bilateral LE Integumentary:Laparoscopic incisions are well  healed   Labs:  CBC Latest Ref Rng & Units 12/17/2019 12/16/2019 12/15/2019  WBC 4.0 - 10.5 K/uL 7.5 8.1 8.5  Hemoglobin 12.0 - 15.0 g/dL 02/14/2020) 7.6(B) 7.3(L)  Hematocrit 36 - 46 % 27.4(L) 26.0(L) 24.3(L)  Platelets 150 - 400 K/uL 298 295 299   CMP Latest Ref Rng & Units 12/17/2019 12/15/2019 12/14/2019  Glucose 70 - 99 mg/dL 02/13/2020) 937(T) 024(O)  BUN 6 - 20 mg/dL 15 17 17   Creatinine 0.44 - 1.00 mg/dL 973(Z 3.29  Sodium 135 - 145 mmol/L 136 133(L) 135  Potassium 3.5 - 5.1 mmol/L 3.9 3.9 4.0  Chloride 98 - 111 mmol/L 97(L) 96(L) 97(L)  CO2 22 - 32 mmol/L 28 29 32  Calcium 8.9 - 10.3 mg/dL 8.2(L) 8.0(L) 8.0(L)  Total Protein 6.5 - 8.1 g/dL - - 7.9  Total Bilirubin 0.3 - 1.2 mg/dL - - 0.5  Alkaline Phos 38 - 126 U/L - - 144(H)  AST 15 - 41 U/L - - 30  ALT 0 - 44 U/L - - 79(H)     Imaging studies: No new pertinent imaging studies   Assessment/Plan:  34 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 29 Days Post-Op s/p robotic assisted laparoscopic lysis of adhesion and drainage of intra-abdominal abscess WITHOUT evidence of perforation or frank stoolforperforated diverticulitis, complicated by hypoxic respiratory failure requiring post-operative intubation(since extubated)   - Goal for today: Work with PT on mobilization to commode,if tolerates this well,I think we can try and remove fecal management device today   - Okay to continue soft/regular diet + nutritional supplementation               - Continue IV ABx; Meropenem stopped on 10/04, started on Ceftriaxone (Day  3/4), Linezolid (Day 6/7); ID following; plan to stop ABx this week (10/08)             - Continue RLQ/LLQ drains; monitor and record output - No plans for surgical re-intervention at this time - Monitor abdominal examination; on-going bowel function - Pain control prn; antiemetics prn -Monitor H&H; s/p transfusion 1 unit pRBC on  09/21 --> only re-transfuse if "absolutely necessary"              - Monitor leukocytosis, renal function             - Home medications; continue; appreciate hospitalist consultation -Mobilization encouraged as tolerated; PT/OT following; recommending SNF - DVT prophylaxis              - Discharge Planning; remove rectal tube, ensure leukocytosis remains resolved of Abx, continue working with therapies...Marland Kitchenhopefully ready for DC sometime early next week to SNF/Rehab. No rush nor guarantees in this complex case.   All of the above findings and recommendations were discussed with the patient, and the medical team, and all of patient's questions were answered to her expressed satisfaction.  -- Lynden Oxford, PA-C [facility] 12/17/2019, 7:19 AM [phone] M-F: 7am - 4pm

## 2019-12-17 NOTE — Evaluation (Signed)
Occupational Therapy Evaluation Patient Details Name: Amanda Reyes MRN: [id number] DOB: 1985-06-02 Today's Date: 12/17/2019    History of Present Illness Pt is 34 year old female with past medical history for morbid obesity and hypertension, admitted to ICU due to acute diverticulitis and microperforation.  Patient is s/p of robotic surgery for lysis of adhesions.   Clinical Impression   Patient presenting with decreasing I with self care, balance, endurance, safety awareness, and strength.  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 Patient currently functioning at min A of 2 for functional transfer and mobility,set up A for grooming and UB self care, pt likely needs mod- max A for LB self care. She would benefit from energy conservation education and AE education as well to increase self care independence. Patient will benefit from acute OT to increase overall independence in the areas of ADLs, functional mobility, and safety awareness in order to safely discharge home with caregiver.    Follow Up Recommendations  Home health OT;Supervision/Assistance - 24 hour    Equipment Recommendations  Bariatric RW   Recommendations for Other Services Other (comment) (none at this time)     Precautions / Restrictions Precautions Precautions: Fall      Mobility Bed Mobility Overal bed mobility: Needs Assistance Bed Mobility: Rolling;Supine to Sit Rolling: Min assist;+2 for physical assistance   Supine to sit: Min assist;+2 for physical assistance        Transfers Overall transfer level: Needs assistance Equipment used: Rolling walker (2 wheeled) Transfers: Sit to/from [employer] Sit to Stand: Min assist;+2 physical assistance Stand pivot transfers: Min assist;+2 physical assistance            Balance Overall balance assessment: Needs assistance Sitting-balance support: Feet unsupported;Bilateral upper extremity  supported Sitting balance-Leahy Scale: Good     Standing balance support: Bilateral upper extremity supported Standing balance-Leahy Scale: Fair Standing balance comment: reliance on RW        ADL either performed or assessed with clinical judgement   ADL Overall ADL's : Needs assistance/impaired Eating/Feeding: Independent   Grooming: Wash/dry hands;Wash/dry face;Oral care;Set up;Sitting   Upper Body Bathing: Set up;Sitting   Lower Body Bathing: Moderate assistance;Maximal assistance;Sitting/lateral leans   Upper Body Dressing : Set up;Sitting   Lower Body Dressing: Maximal assistance;Total assistance;Sit to/from stand              Vision Baseline Vision/History: No visual deficits Patient Visual Report: No change from baseline              Pertinent Vitals/Pain Pain Assessment: No/denies pain     Hand Dominance Right   Extremity/Trunk Assessment Upper Extremity Assessment Upper Extremity Assessment: Generalized weakness   Lower Extremity Assessment Lower Extremity Assessment: Generalized weakness       Communication Communication Communication: No difficulties   Cognition Arousal/Alertness: Awake/alert Behavior During Therapy: WFL for tasks assessed/performed Overall Cognitive Status: Within Functional Limits for tasks assessed                     Home Living Family/patient expects to be discharged to:: Private residence Living Arrangements: Alone Available Help at Discharge: Family Type of Home: Apartment Home Access: Level entry     Home Layout: One level     Bathroom Shower/Tub: Chief Strategy Officer: Standard Bathroom Accessibility: Yes   Home Equipment: None          Prior Functioning/Environment Level of Independence: Independent  OT Problem List: Decreased strength;Decreased activity tolerance;Decreased safety awareness;Impaired balance (sitting and/or standing);Decreased knowledge of  use of DME or AE;Decreased knowledge of precautions      OT Treatment/Interventions: Self-care/ADL training;Therapeutic exercise;Therapeutic activities;Energy conservation;Patient/family education;DME and/or AE instruction;Balance training    OT Goals(Current goals can be found in the care plan section) Acute Rehab OT Goals Patient Stated Goal: to walk OT Goal Formulation: With patient Time For Goal Achievement: 12/31/19 Potential to Achieve Goals: Good ADL Goals Pt Will Perform Grooming: with supervision;sitting Pt Will Transfer to Toilet: with supervision;ambulating Pt Will Perform Toileting - Clothing Manipulation and hygiene: with supervision;sitting/lateral leans  OT Frequency: Min 1X/week   Barriers to D/C:    none at this time       Co-evaluation PT/OT/SLP Co-Evaluation/Treatment: Yes Reason for Co-Treatment: Complexity of the patient's impairments (multi-system involvement);To address functional/ADL transfers;For patient/therapist safety   OT goals addressed during session: ADL's and self-care;Other (comment) (functional mobility)      AM-PAC OT "6 Clicks" Daily Activity     Outcome Measure Help from another person eating meals?: None Help from another person taking care of personal grooming?: None Help from another person toileting, which includes using toliet, bedpan, or urinal?: A Lot Help from another person bathing (including washing, rinsing, drying)?: A Lot Help from another person to put on and taking off regular upper body clothing?: A Little Help from another person to put on and taking off regular lower body clothing?: A Lot 6 Click Score: 17   End of Session Equipment Utilized During Treatment: Rolling walker Nurse Communication: Mobility status;Precautions  Activity Tolerance: Patient tolerated treatment well Patient left: in chair;with chair alarm set  OT Visit Diagnosis: Unsteadiness on feet (R26.81);Muscle weakness (generalized) (M62.81)                 Time: 4332-9518 OT Time Calculation (min): 46 min Charges:  OT General Charges $OT Visit: 1 Visit OT Evaluation $OT Eval Low Complexity: 1 Low OT Treatments $Self Care/Home Management : 8-22 mins  Jackquline Denmark, MS, OTR/L , CBIS ascom [phone]  12/17/19, 11:01 AM

## 2019-12-17 NOTE — TOC Progression Note (Signed)
Transition of Care [facility]) - Progression Note    Patient Details  Name: TAMEIA RAFFERTY MRN: [id number] Date of Birth: [birth year]  Transition of Care [facility]) CM/SW Contact  Chapman Fitch, RN Phone Number: 12/17/2019, 2:44 PM  Clinical Narrative:      Patient has worked with PT recommendations have been upgraded to home health PT Patient edger to get home at discharge.  Patient will be discharging to her home and her mother will be staying with her at discharge  Patient does not have a PCP.  Agreeable to new patient appointment to be made.  States she does not have preference of practice  Referral made to [facility] for bariatric RW and [facility] Patient agreeable to home health services.  States that she does not have a preference of home health agency.  Referral made to [facility] with [facility]   Expected Discharge Plan: Home w Home Health Services    Expected Discharge Plan and Services Expected Discharge Plan: Home w Home Health Services                         DME Arranged: Walker rolling, 3-N-1 DME Agency: [facility]       HH Arranged: RN, PT, OT, Nurse's Aide HH Agency: [facility] Date [facility] Agency Contacted: 12/17/19   Representative spoke with at [facility] Agency: Barbara Cower   Social Determinants of Health (SDOH) Interventions    Readmission Risk Interventions No flowsheet data found.

## 2019-12-17 NOTE — Progress Notes (Signed)
Physical Therapy Treatment Patient Details Name: Amanda Reyes MRN: [id number] DOB: 1986-02-20 Today's Date: 12/17/2019    History of Present Illness Pt is 34 year old female with past medical history for morbid obesity and hypertension, admitted to ICU due to acute diverticulitis and microperforation.  Patient is s/p of robotic surgery for lysis of adhesions.    PT Comments    Patient received in bed, mother present in room. Patient agrees to PT session. She requires min +2 assist for supine to sit. Able to sit unsupported at edge of bed. She has some dizziness with initial sitting. She is able to stand from bed with +2 min assist and ambulated 4 feet to recliner with bariatric RW. After seated rest, patient is able to ambulate another 6 feet with bariatric RW and min +2 assist. She will continue to benefit from skilled PT to improve functional independence and strength for return home with help from mother.           Follow Up Recommendations  Home health PT;Supervision/Assistance - 24 hour;Supervision for mobility/OOB     Equipment Recommendations  Rolling walker with 5" wheels;Other (comment) (Bariatric)    Recommendations for Other Services       Precautions / Restrictions Precautions Precautions: Fall Restrictions Weight Bearing Restrictions: No    Mobility  Bed Mobility Overal bed mobility: Needs Assistance Bed Mobility: Supine to Sit Rolling: Min assist;+2 for physical assistance   Supine to sit: Min assist;+2 for physical assistance     General bed mobility comments: patient demonstrates improved ability to perform supine to sit this visit.  Transfers Overall transfer level: Needs assistance Equipment used: Rolling walker (2 wheeled) Transfers: Sit to/from [employer] Sit to Stand: Min assist;+2 physical assistance Stand pivot transfers: Min assist;+2 physical assistance       General transfer comment: patient able to stand from bed and  recliner with min A +2. Cues for hand placement and postitoning.  Ambulation/Gait Ambulation/Gait assistance: Min guard;+2 physical assistance Gait Distance (Feet): 10 Feet Assistive device: Rolling walker (2 wheeled) Gait Pattern/deviations: Step-to pattern;Wide base of support;Decreased stride length;Shuffle Gait velocity: decreased   General Gait Details: patient able to ambulate 4 feet then 6 feet with RW and +2 min assist. Seated rest between, chari following.   Stairs             Wheelchair Mobility    Modified Rankin (Stroke Patients Only)       Balance Overall balance assessment: Needs assistance Sitting-balance support: Feet supported;Bilateral upper extremity supported Sitting balance-Leahy Scale: Good Sitting balance - Comments: pt able to maintain balance sitting EOB with feet unsupported and bilateral UE support. able to tolerate some small balance challenges   Standing balance support: Bilateral upper extremity supported;During functional activity Standing balance-Leahy Scale: Fair Standing balance comment: reliance on RW                            Cognition Arousal/Alertness: Awake/alert Behavior During Therapy: WFL for tasks assessed/performed Overall Cognitive Status: Within Functional Limits for tasks assessed                                        Exercises      General Comments        Pertinent Vitals/Pain Pain Assessment: No/denies pain    Home Living Family/patient expects to be discharged to::  Private residence Living Arrangements: Alone Available Help at Discharge: Family Type of Home: Apartment Home Access: Level entry   Home Layout: One level Home Equipment: None      Prior Function Level of Independence: Independent          PT Goals (current goals can now be found in the care plan section) Acute Rehab PT Goals Patient Stated Goal: to walk PT Goal Formulation: With patient Time For Goal  Achievement: 12/31/19 Potential to Achieve Goals: Fair Progress towards PT goals: Progressing toward goals    Frequency    Min 2X/week      PT Plan Discharge plan needs to be updated    Co-evaluation PT/OT/SLP Co-Evaluation/Treatment: Yes Reason for Co-Treatment: For patient/therapist safety;To address functional/ADL transfers PT goals addressed during session: Mobility/safety with mobility;Balance OT goals addressed during session: ADL's and self-care;Other (comment) (functional mobility)      AM-PAC PT "6 Clicks" Mobility   Outcome Measure  Help needed turning from your back to your side while in a flat bed without using bedrails?: A Little Help needed moving from lying on your back to sitting on the side of a flat bed without using bedrails?: A Little Help needed moving to and from a bed to a chair (including a wheelchair)?: A Little Help needed standing up from a chair using your arms (e.g., wheelchair or bedside chair)?: A Little Help needed to walk in hospital room?: A Little Help needed climbing 3-5 steps with a railing? : Total 6 Click Score: 16    End of Session   Activity Tolerance: Patient tolerated treatment well Patient left: in chair;with family/visitor present Nurse Communication: Mobility status;Need for lift equipment PT Visit Diagnosis: Muscle weakness (generalized) (M62.81);Difficulty in walking, not elsewhere classified (R26.2)     Time: 3382-5053 PT Time Calculation (min) (ACUTE ONLY): 26 min  Charges:  $Gait Training: 8-22 mins $Therapeutic Activity: 8-22 mins                     Ayvah Caroll, PT, GCS 12/17/19,11:27 AM

## 2019-12-18 DIAGNOSIS — K5792 Diverticulitis of intestine, part unspecified, without perforation or abscess without bleeding: Secondary | ICD-10-CM | POA: Diagnosis not present

## 2019-12-18 DIAGNOSIS — K651 Peritoneal abscess: Secondary | ICD-10-CM | POA: Diagnosis not present

## 2019-12-18 DIAGNOSIS — D649 Anemia, unspecified: Secondary | ICD-10-CM | POA: Diagnosis not present

## 2019-12-18 NOTE — Plan of Care (Signed)
Continuing with plan of care. 

## 2019-12-18 NOTE — Progress Notes (Signed)
Date of Admission:  11/09/2019   T 11/18/19 robotic assisted  laparoscopy Antibiotic Meropenem 9/9>10/4 anidulaungin 9/9 >9/17 Fluconazole 11/27/19>>12/07/19 Linezolid 12/11/19> Ceftriaxone 10/5  Culture8/30 BC -NG 9/3 intraabdominal abscess culture e.coli,  Streptococcus group C 11/27/19 -RT Upper quadrant abscess culture- lactobacillus and candida albicans 9/29 JP drain fluid- efeacium, e.coli  Foley -11/18/19>>12/14/19 PICC Rt arm 11/19/19>> Rt upper quadrant drain -11/27/19>>12/09/19 Rt LQ drain 11/18/19>> Left LQ drain 12/07/19>> Rectal tube    Subjective: Doing better she says Walked with PT in the hallway using RW Sat in recliner for 2 hours Eating solid food   Medications:  . amLODipine  10 mg Oral Daily  . Chlorhexidine Gluconate Cloth  6 each Topical Q0600  . enoxaparin (LOVENOX) injection  40 mg Subcutaneous Q12H  . feeding supplement (ENSURE ENLIVE)  237 mL Oral TID BM  . hydrochlorothiazide  25 mg Oral Daily  . mouth rinse  15 mL Mouth Rinse q12n4p  . pantoprazole (PROTONIX) IV  40 mg Intravenous Q12H  . sodium chloride flush  10-40 mL Intracatheter Q12H  . sodium chloride flush  5 mL Intracatheter Q8H  . sodium chloride flush  5 mL Intracatheter Q8H    Objective: Vital signs in last 24 hours: Temp:  [97.8 F (36.6 C)-98.4 F (36.9 C)] 97.8 F (36.6 C) (10/08 0439) Pulse Rate:  [104-110] 107 (10/08 0439) Resp:  [18-20] 18 (10/08 0439) BP: (112-139)/(72-99) 126/96 (10/08 0439) SpO2:  [95 %-98 %] 95 % (10/08 0439)   Patient Vitals for the past 24 hrs:  BP Temp Temp src Pulse Resp SpO2  12/18/19 0439 (!) 126/96 97.8 F (36.6 C) Oral (!) 107 18 95 %  12/17/19 2311 112/72 98 F (36.7 C) Oral (!) 104 20 95 %  12/17/19 2107 (!) 139/99 98.3 F (36.8 C) Oral (!) 110 20 95 %  12/17/19 1630 118/73 98.4 F (36.9 C) Oral (!) 105 -- 98 %    PHYSICAL EXAM:  General: Alert, cooperative, no distress, appears stated age.pale  Head: Normocephalic, without  obvious abnormality, atraumatic. Eyes: Conjunctivae clear, anicteric sclerae. Pupils are equal ENT Nares normal. No drainage or sinus tenderness. Lips, mucosa, and tongue normal. No Thrush Lungs: b/l air entry- incentive spirometry 750 cc Heart: Regular rate and rhythm, no murmur, rub or gallop. Abdomen: Soft, non-tender,not distended. 2 drains- LLQ- feculent drainage Rt LLQ-minimal  Extremities: atraumatic, no cyanosis. No edema. No clubbing Skin: No rashes or lesions. Or bruising Lymph: Cervical, supraclavicular normal. Neurologic: Grossly non-focal  Lab Results Recent Labs    12/16/19 0425 12/17/19 0626  WBC 8.1 7.5  HGB 8.4* 8.6*  HCT 26.0* 27.4*  NA  --  136  K  --  3.9  CL  --  97*  CO2  --  28  BUN  --  15  CREATININE  --  0.52   Assessment/Plan: Impression/recommendation Perforated diverticulitis with extensive intra-abdominal loop abscesses-admitted 11/09/19 Status post laparoscopy and washout.on 11/18/19 CT abdomen done9/16shows an abscess along rtlateral margin liverand organizing abscesses central abdominal mesentry - . IRplaced another drain9/17.Culturelactobacillus and rare candida albicans -was on meropenem which was changed to ceftriaxone 4 days ago CT abdomenwas donea new drain placed on 12/07/19 repeat culture enterococcus faecium and e.coli addedlinezolid on 10/1 ( but not VRE) It is not going to be possible to treat the stool organisms which arelikelyleaking intrabdominally thru Perforation/fistula. This is causing ongoing low level cytokine response inspite of antibiotic. she has developed MDRO .Tight source control is the key Linezolid  10/1 >>12/18/19 Will DC  ceftriaxone 10/8 and wathc off antibiotics  antibiotics and observe   Anemia-received a unit of PRBC - HB improved to 8.4 patient participating with PT and walked today  Morbid obesity   She needs to consistently participate in PT, do incentive spirometry, sit up  in bed/chair Discussedwith patient and her mother  ID will follow her peripherally this weekend

## 2019-12-18 NOTE — TOC Progression Note (Signed)
Transition of Care [facility]) - Progression Note    Patient Details  Name: Amanda Reyes MRN: [id number] Date of Birth: 10-22-1985  [facility]) CM/SW Contact  Chapman Fitch, RN Phone Number: 12/18/2019, 9:14 AM  Clinical Narrative:     Referral accepted by Barbara Cower from [facility] for RN, PT, OT, aide Per Elease Hashimoto with [facility] bariatric RW and bsc have been delivered to room  [facility] has left message with [facility] to set up new patient PCP appointment.  Awaiting return call.  Per Barbara Cower with [facility] surgery can sign home health orders in the interim   Expected Discharge Plan: Home w Home Health Services    Expected Discharge Plan and Services Expected Discharge Plan: Home w Home Health Services                         DME Arranged: Walker rolling, 3-N-1 DME Agency: [facility]       HH Arranged: RN, PT, OT, Nurse's Aide HH Agency: [facility] Date HH Agency Contacted: 12/17/19   Representative spoke with at [facility] Agency: Barbara Cower   Social Determinants of Health (SDOH) Interventions    Readmission Risk Interventions Readmission Risk Prevention Plan 12/18/2019  Transportation Screening Complete  HRI or Home Care Consult Complete  Palliative Care Screening Not Applicable  Medication Review (RN Care Manager) Complete  Some recent data might be hidden

## 2019-12-18 NOTE — Progress Notes (Signed)
[facility] SURGICAL PROGRESS NOTE  Hospital Day(s): 38.   Post op day(s): 30 Days Post-Op.   Interval History:  Patient seen and examined, I finally caught her up in a chair.  She looks great, smiling. No acute events or new complaints overnight.  Patient reports she continues to feel a little better daily No complaints of other abdominal pain, only drain sites.   PO medications helping, mostly Tramadol She reports her appetite is improving. No fever, chills, CP, SOB, nausea, emesis Leukocytosis remains resolved x4 days, not evaluated today. Renal function remains normal, last sCr - 0.52, current UO - 1.4L Drain output as follows:  - RLQ:95ccs,this appears more purulent - LLQ:210ccs,appears morefeculent Meropenem stopped on 10/4, Ceftriaxone started 10/5 (Day 4/4), Linezolid added on 10/01 (Day 7/7); ID following Working with therapies; recommendations are SNF; she reports she has been getting to edge of bed and working on getting OOB/Standing  Vital signs in last 24 hours: [min-max] current  Temp:  [97.8 F (36.6 C)-98.4 F (36.9 C)] 98.3 F (36.8 C) (10/08 1237) Pulse Rate:  [104-121] 109 (10/08 1237) Resp:  [18-20] 18 (10/08 1237) BP: (112-143)/(72-103) 143/97 (10/08 1237) SpO2:  [95 %-99 %] 98 % (10/08 1237)     Height: 5\' 9"  (175.3 cm) Weight: (!) 220.4 kg BMI (Calculated): 71.74   Intake/Output last 2 shifts:  10/07 0701 - 10/08 0700 In: 1358.3 [P.O.:600; I.V.:13.3; IV Piggyback:730] Out: 1810 [Urine:1405; Drains:305; Stool:100]   Physical Exam:  Constitutional: alert, cooperative and no distress Respiratory:Breathing appears to be improving, on RA, still with some intermittent conversational dyspnea but again this has been her baseline herein the hospital Cardiovascular:Regular rate and sinus rhythm  Gastrointestinal:Obese, soft,no appreciable tenderness aside from drains, non-distended. Rectal tube in place. RLQ  drain with seropurulent output, LLQ drain with feculent output.  Musculoskeletal: 1+ edema to bilateral LE Integumentary:Laparoscopic incisions are well healed   Labs:  CBC Latest Ref Rng & Units 12/17/2019 12/16/2019 12/15/2019  WBC 4.0 - 10.5 K/uL 7.5 8.1 8.5  Hemoglobin 12.0 - 15.0 g/dL 02/14/2020) 4.6(E) 7.3(L)  Hematocrit 36 - 46 % 27.4(L) 26.0(L) 24.3(L)  Platelets 150 - 400 K/uL 298 295 299   CMP Latest Ref Rng & Units 12/17/2019 12/15/2019 12/14/2019  Glucose 70 - 99 mg/dL 02/13/2020) 500(X) 381(W)  BUN 6 - 20 mg/dL 15 17 17   Creatinine 0.44 - 1.00 mg/dL 299(B 7.16  Sodium 135 - 145 mmol/L 136 133(L) 135  Potassium 3.5 - 5.1 mmol/L 3.9 3.9 4.0  Chloride 98 - 111 mmol/L 97(L) 96(L) 97(L)  CO2 22 - 32 mmol/L 28 29 32  Calcium 8.9 - 10.3 mg/dL 8.2(L) 8.0(L) 8.0(L)  Total Protein 6.5 - 8.1 g/dL - - 7.9  Total Bilirubin 0.3 - 1.2 mg/dL - - 0.5  Alkaline Phos 38 - 126 U/L - - 144(H)  AST 15 - 41 U/L - - 30  ALT 0 - 44 U/L - - 79(H)     Imaging studies: No new pertinent imaging studies   Assessment/Plan:  34 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 30 Days Post-Op s/p robotic assisted laparoscopic lysis of adhesion and drainage of intra-abdominal abscess WITHOUT evidence of perforation or frank stoolforperforated diverticulitis, complicated by hypoxic respiratory failure requiring post-operative intubation(since extubated)   -Encouraged to continue her work with PT on mobilization to commode,if tolerates this well, we can remove fecal management device   - Okay to continue soft/regular diet + nutritional supplementation               -  Continue IV ABx; Meropenem stopped on 10/04, started on Ceftriaxone (Day 4/4), Linezolid (Day 7/7); ID following; plan to stop ABx this week (10/08)             - Continue RLQ/LLQ drains; monitor and record output - No plans for surgical re-intervention at this time - Monitor abdominal  examination; on-going bowel function - Pain control prn; antiemetics prn -Monitor H&H; s/p transfusion 1 unit pRBC on 09/21 --> only re-transfuse if "absolutely necessary" per patient request.             - Monitor leukocytosis, renal function             - Home medications; continue; appreciate hospitalist consultation -Mobilization encouraged as tolerated; PT/OT following; recommending SNF - DVT prophylaxis              - Discharge Planning; remove rectal tube, ensure leukocytosis remains resolved of Abx, continue working with therapies...Marland Kitchenhopefully ready for DC sometime early next week to SNF/Rehab. No rush nor guarantees in this complex case.   All of the above findings and recommendations were discussed with the patient, and the medical team, and all of patient's questions were answered to her expressed satisfaction.  -- Campbell Lerner, M.D.  [facility] 12/18/2019, 4:20 PM

## 2019-12-18 NOTE — Progress Notes (Signed)
Physical Therapy Treatment Patient Details Name: Amanda Reyes MRN: [id number] DOB: 1985-08-29 Today's Date: 12/18/2019    History of Present Illness Pt is 34 year old female with past medical history for morbid obesity and hypertension, admitted to ICU due to acute diverticulitis and microperforation.  Patient is s/p of robotic surgery for lysis of adhesions.    PT Comments    Participated in exercises as described below.  To EOB with min a x 2.  Mom in and jumps in to help.  She sits EOB extended period of time and needs encouragement to stand and walk.  She is able to progress gait into hallway with RW and min a x 2 with recliner follow.  Pt and mom report increased speed and confidence today with mobility.  She is limited by general fatigue but does appear to push herself to walk further.  Remains in recliner after session.   Follow Up Recommendations  Home health PT;Supervision/Assistance - 24 hour;Supervision for mobility/OOB     Equipment Recommendations  Rolling walker with 5" wheels;3in1 (PT)    Recommendations for Other Services       Precautions / Restrictions Precautions Precautions: Fall Restrictions Weight Bearing Restrictions: No    Mobility  Bed Mobility Overal bed mobility: Needs Assistance Bed Mobility: Supine to Sit       Sit to supine: Min assist;+2 for physical assistance   General bed mobility comments: mom in and helps with transitions  Transfers Overall transfer level: Needs assistance Equipment used: Rolling walker (2 wheeled)   Sit to Stand: Min assist;+2 physical assistance;Min guard            Ambulation/Gait Ambulation/Gait assistance: Min guard;+2 physical assistance Gait Distance (Feet): 25 Feet Assistive device: Rolling walker (2 wheeled) Gait Pattern/deviations: Step-to pattern;Wide base of support;Decreased stride length;Shuffle Gait velocity: decreased       Stairs             Wheelchair Mobility    Modified  Rankin (Stroke Patients Only)       Balance Overall balance assessment: Needs assistance Sitting-balance support: Feet supported;Bilateral upper extremity supported Sitting balance-Leahy Scale: Good Sitting balance - Comments: pt able to maintain balance sitting EOB with feet unsupported and bilateral UE support. able to tolerate some small balance challenges   Standing balance support: Bilateral upper extremity supported;During functional activity Standing balance-Leahy Scale: Fair Standing balance comment: reliance on RW                            Cognition Arousal/Alertness: Awake/alert Behavior During Therapy: WFL for tasks assessed/performed Overall Cognitive Status: Within Functional Limits for tasks assessed                                        Exercises Total Joint Exercises Ankle Circles/Pumps: AROM;Strengthening;Both;Supine;20 reps Quad Sets: AROM;Strengthening;Both;Supine;20 reps Gluteal Sets: AROM;Strengthening;Both;Supine;20 reps Heel Slides: AROM;Strengthening;Both;Supine;20 reps Hip ABduction/ADduction: AROM;Strengthening;Both;Supine;20 reps Straight Leg Raises: Other (comment) (Attempted to perform, but unable to complete.) Knee Flexion: AROM;Strengthening;Both;20 reps;Seated    General Comments        Pertinent Vitals/Pain Pain Assessment: No/denies pain    Home Living                      Prior Function            PT Goals (current goals can now be  found in the care plan section) Progress towards PT goals: Progressing toward goals    Frequency    Min 2X/week      PT Plan Current plan remains appropriate    Co-evaluation              AM-PAC PT "6 Clicks" Mobility   Outcome Measure  Help needed turning from your back to your side while in a flat bed without using bedrails?: A Little Help needed moving from lying on your back to sitting on the side of a flat bed without using bedrails?: A  Little Help needed moving to and from a bed to a chair (including a wheelchair)?: A Little Help needed standing up from a chair using your arms (e.g., wheelchair or bedside chair)?: A Little Help needed to walk in hospital room?: A Little Help needed climbing 3-5 steps with a railing? : A Lot 6 Click Score: 17    End of Session Equipment Utilized During Treatment: Gait belt Activity Tolerance: Patient tolerated treatment well Patient left: in chair;with family/visitor present Nurse Communication: Mobility status Pain - Right/Left: Right     Time: 1000-1038 PT Time Calculation (min) (ACUTE ONLY): 38 min  Charges:  $Gait Training: 8-22 mins $Therapeutic Exercise: 8-22 mins $Therapeutic Activity: 8-22 mins                    Danielle Dess, PTA 12/18/19, 12:37 PM

## 2019-12-19 NOTE — Progress Notes (Signed)
[facility] SURGICAL PROGRESS NOTE  Hospital Day(s): 40.   Post op day(s): 31 Days Post-Op.   Interval History:  Patient seen and examined, up in a chair, again today.  She looks great, smiling. No acute events or new complaints overnight.  Patient reports she continues to feel better daily No complaints of other abdominal pain, only drain sites.   PO medications helping, mostly Tramadol She reports her appetite is improving. No fever, chills, CP, SOB, nausea, emesis Leukocytosis remains resolved x4 days, not evaluated today. Renal function remains normal, last sCr - 0.52, current UO - 2.1L Drain output as follows:              - RLQ: 0 mLs, recorded             - LLQ: 210 ccs, appears more feculent  Meropenem stopped on 10/4, Ceftriaxone started 10/5 (Day 4/4), Linezolid added on 10/01 (Day 7/7); ID following, now off all abx for observation.   Working with therapies; recommendations are SNF; she reports she has been getting to edge of bed and working on getting OOB/Standing  Vital signs in last 24 hours: [min-max] current  Temp:  [98 F (36.7 C)-98.6 F (37 C)] 98.2 F (36.8 C) (10/09 0448) Pulse Rate:  [98-121] 101 (10/09 0448) Resp:  [16-20] 20 (10/08 2343) BP: (125-147)/(87-103) 147/94 (10/09 0448) SpO2:  [98 %-100 %] 99 % (10/09 0448)     Height: 5\' 9"  (175.3 cm) Weight: (!) 220.4 kg BMI (Calculated): 71.74   Intake/Output last 2 shifts:  10/08 0701 - 10/09 0700 In: 844.1 [I.V.:238.3; IV Piggyback:595.8] Out: 2310 [Urine:2100; Drains:210]   Physical Exam:  Constitutional: alert, cooperative and no distress  Respiratory: Breathing appears to be improving, on RA, still with some intermittent conversational dyspnea but again this has been her baseline here in the hospital Cardiovascular: Regular rate and sinus rhythm  Gastrointestinal: Obese, soft, no appreciable tenderness aside from drains, non-distended. Rectal tube in place. RLQ drain with  seropurulent output, LLQ drain with feculent output.  Musculoskeletal: 1+ edema to bilateral LE Integumentary: Laparoscopic incisions are well healed   Labs:  CBC Latest Ref Rng & Units 12/17/2019 12/16/2019 12/15/2019  WBC 4.0 - 10.5 K/uL 7.5 8.1 8.5  Hemoglobin 12.0 - 15.0 g/dL 02/14/2020) 9.3(A) 7.3(L)  Hematocrit 36 - 46 % 27.4(L) 26.0(L) 24.3(L)  Platelets 150 - 400 K/uL 298 295 299   CMP Latest Ref Rng & Units 12/17/2019 12/15/2019 12/14/2019  Glucose 70 - 99 mg/dL 02/13/2020) 732(K) 025(K)  BUN 6 - 20 mg/dL 15 17 17   Creatinine 0.44 - 1.00 mg/dL 270(W 2.37  Sodium 135 - 145 mmol/L 136 133(L) 135  Potassium 3.5 - 5.1 mmol/L 3.9 3.9 4.0  Chloride 98 - 111 mmol/L 97(L) 96(L) 97(L)  CO2 22 - 32 mmol/L 28 29 32  Calcium 8.9 - 10.3 mg/dL 8.2(L) 8.0(L) 8.0(L)  Total Protein 6.5 - 8.1 g/dL - - 7.9  Total Bilirubin 0.3 - 1.2 mg/dL - - 0.5  Alkaline Phos 38 - 126 U/L - - 144(H)  AST 15 - 41 U/L - - 30  ALT 0 - 44 U/L - - 79(H)     Imaging studies: No new pertinent imaging studies   Assessment/Plan:  34 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 31 Days Post-Op s/p robotic assisted laparoscopic lysis of adhesion and drainage of intra-abdominal abscess WITHOUT evidence of perforation or frank stool for perforated diverticulitis, complicated by hypoxic respiratory failure  requiring post-operative intubation (since extubated)   -Encouraged to continue her work with PT on mobilization to commode,if tolerates this well, we can remove fecal management device   - Okay to continue soft/regular diet + nutritional supplementation               - Continue IV ABx; Meropenem stopped on 10/04, started on Ceftriaxone (Day 4/4), Linezolid (Day 7/7); ID following; plan observe off all ABx as last dose on (10/08)             - Continue RLQ/LLQ drains; monitor and record output             - No plans for surgical re-intervention at this time             - Monitor abdominal  examination; on-going bowel function             - Pain control prn; antiemetics prn             - Monitor leukocytosis, renal function             - Home medications; continue; appreciate hospitalist consultation             - Mobilization encouraged as tolerated; PT/OT following; recommending SNF             - DVT prophylaxis               - Discharge Planning; ensure leukocytosis remains after interval of no Abx, continue working with therapies...Marland Kitchenhopefully ready for DC sometime early next week to SNF/Rehab. No rush nor guarantees in this complex case.    All of the above findings and recommendations were discussed with the patient, and the medical team, and all of patient's questions were answered to her expressed satisfaction.  -- Campbell Lerner, M.D.  [facility] 12/19/2019, 9:49 AM

## 2019-12-19 NOTE — Progress Notes (Signed)
Mobility Specialist - Progress Note   12/19/19 1438  Mobility  Activity Transferred:  Chair to bed  Level of Assistance Minimal assist, patient does 75% or more (+2 for safety)  Assistive Device Front wheel walker  Distance Ambulated (ft) 5 ft  Mobility Response Tolerated well  Mobility performed by Mobility specialist  $Mobility charge 1 Mobility    Per pt request, pt wanted assistance w/ transferring from chair to bed. Pt transferred from chair to bed w/ min. Assist, +2 for safety. No LOB noted. Pt transitioned from sit to supine w/ mod. Assist +2. Pt able to reposition herself in the bed w/ SBA. Overall, pt tolerated session well. Pt left in the bed w/ mom present in room. All needs placed in reach.      [name] Mobility Specialist  12/19/19, 2:40 PM

## 2019-12-19 NOTE — Plan of Care (Signed)
Continuing with plan of care. 

## 2019-12-19 NOTE — Progress Notes (Signed)
Mobility Specialist - Progress Note   12/19/19 1205  Mobility  Activity Ambulated in room  Level of Assistance Contact guard assist, steadying assist (min. assist S2S, +2 for safety)  Assistive Device Front wheel walker  Distance Ambulated (ft) 15 ft  Mobility Response Tolerated well  Mobility performed by Mobility specialist  $Mobility charge 1 Mobility    Pt laying in bed w/ mom present in room upon arrival. Pt agreed to session. Pt transitioned supine to sit w/ SBA. Pt did require extra time to rest after sitting EOB d/t fatigue. After ~5-10 mins, pt able to S2S w/ min. Assist, +2 for safety. Pt ambulated ~15' total in room CGA, +2 for safety. Pt ambulated to chair. Overall, pt tolerated session very well. No LOB noted. Steady and slow gait. Noted improvement on overall strength and stability while ambulating. Pt encouraged to continue seated exercises in chair and bed exercises in b/w session. Pt left sitting on recliner w/ mom and nurse present in room.      Dellar Traber Mobility Specialist  12/19/19, 12:12 PM

## 2019-12-20 MED ORDER — OXYCODONE HCL 5 MG PO TABS
10.0000 mg | ORAL_TABLET | Freq: Four times a day (QID) | ORAL | Status: DC | PRN
  Administered 2019-12-20: 10 mg via ORAL
  Filled 2019-12-20: qty 2

## 2019-12-20 MED ORDER — KETOROLAC TROMETHAMINE 30 MG/ML IJ SOLN
30.0000 mg | Freq: Four times a day (QID) | INTRAMUSCULAR | Status: DC | PRN
  Administered 2019-12-20 – 2019-12-21 (×3): 30 mg via INTRAVENOUS
  Filled 2019-12-20 (×3): qty 1

## 2019-12-20 MED ORDER — TRAMADOL HCL 50 MG PO TABS
50.0000 mg | ORAL_TABLET | Freq: Four times a day (QID) | ORAL | Status: DC | PRN
  Administered 2019-12-20: 50 mg via ORAL
  Filled 2019-12-20: qty 1

## 2019-12-20 NOTE — Plan of Care (Signed)
Continuing with plan of care. 

## 2019-12-20 NOTE — Progress Notes (Signed)
[facility] SUR[facility]ICAL PROGRESS NOTE  Hospital Day(s): 41.   Post op day(s): 32 Days Post-Op.   Interval History:  Patient seen and examined, up in a chair, again today.  She looks great, smiling. No acute events or new complaints overnight.  Patient reports she continues to feel better daily No complaints of other abdominal pain, only drain sites.   PO medications helping, mostly Tramadol She reports her appetite is improving. No fever, chills, CP, SOB, nausea, emesis Leukocytosis remains resolved x4 days, not evaluated today. Renal function remains normal, last sCr - 0.52, current UO - 1.4L Drain output as follows:              - RLQ: 5 mLs, recorded             - LLQ: 90 mLs, appears more feculent  Meropenem stopped on 10/4, Ceftriaxone started 10/5 (Day 4/4), Linezolid added on 10/01 (Day 7/7); ID following, now off all abx for observation.   Working with therapies; recommendations are SNF; she reports she has been getting to edge of bed and working on getting OOB/Standing  Vital signs in last 24 hours: [min-max] current  Temp:  [98.5 F (36.9 C)-99.1 F (37.3 C)] 98.9 F (37.2 C) (10/10 0413) Pulse Rate:  [102-108] 105 (10/10 0413) Resp:  [16-20] 18 (10/10 0413) BP: (111-127)/(65-87) 127/65 (10/10 0413) SpO2:  [94 %-98 %] 94 % (10/10 0413)     Height: 5\' 9"  (175.3 cm) Weight: (!) 220.4 kg BMI (Calculated): 71.74   Intake/Output last 2 shifts:  10/09 0701 - 10/10 0700 In: 615 [P.O.:600; I.V.:5] Out: 1620 [Urine:1425; Drains:95; Stool:100]   Physical Exam:  Constitutional: alert, cooperative and no distress  Respiratory: Breathing appears to be improving, on RA, still with some intermittent conversational dyspnea but again this has been her baseline here in the hospital Cardiovascular: Regular rate and sinus rhythm  Gastrointestinal: Obese, soft, no appreciable tenderness aside from drains, non-distended. Rectal tube in place. RLQ drain with seropurulent  output, LLQ drain with feculent output.  Musculoskeletal: 1+ edema to bilateral LE Integumentary: Laparoscopic incisions are well healed   Labs:  CBC Latest Ref Rng & Units 12/17/2019 12/16/2019 12/15/2019  WBC 4.0 - 10.5 K/uL 7.5 8.1 8.5  Hemoglobin 12.0 - 15.0 g/dL 02/14/2020) 2.6(Z) 7.3(L)  Hematocrit 36 - 46 % 27.4(L) 26.0(L) 24.3(L)  Platelets 150 - 400 K/uL 298 295 299   CMP Latest Ref Rng & Units 12/17/2019 12/15/2019 12/14/2019  Glucose 70 - 99 mg/dL 02/13/2020) 580(D) 983(J)  BUN 6 - 20 mg/dL 15 17 17   Creatinine 0.44 - 1.00 mg/dL 825(K 5.39  Sodium 135 - 145 mmol/L 136 133(L) 135  Potassium 3.5 - 5.1 mmol/L 3.9 3.9 4.0  Chloride 98 - 111 mmol/L 97(L) 96(L) 97(L)  CO2 22 - 32 mmol/L 28 29 32  Calcium 8.9 - 10.3 mg/dL 8.2(L) 8.0(L) 8.0(L)  Total Protein 6.5 - 8.1 g/dL - - 7.9  Total Bilirubin 0.3 - 1.2 mg/dL - - 0.5  Alkaline Phos 38 - 126 U/L - - 144(H)  AST 15 - 41 U/L - - 30  ALT 0 - 44 U/L - - 79(H)     Imaging studies: No new pertinent imaging studies   Assessment/Plan:  34 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 32 Days Post-Op s/p robotic assisted laparoscopic lysis of adhesion and drainage of intra-abdominal abscess WITHOUT evidence of perforation or frank stool for perforated diverticulitis, complicated by hypoxic respiratory failure  requiring post-operative intubation (since extubated)   -Encouraged to continue her work with PT on mobilization to commode,if tolerates this well, we can remove fecal management device   - Okay to continue soft/regular diet + nutritional supplementation               - Continue IV ABx; Meropenem stopped on 10/04, started on Ceftriaxone (Day 4/4), Linezolid (Day 7/7); ID following; plan observe off all ABx as last dose on (10/08)             - Continue RLQ/LLQ drains; monitor and record output             - No plans for surgical re-intervention at this time             - Monitor abdominal examination;  on-going bowel function             - Pain control prn; antiemetics prn             - Monitor leukocytosis, renal function             - Home medications; continue; appreciate hospitalist consultation             - Mobilization encouraged as tolerated; PT/OT following; recommending SNF             - DVT prophylaxis               - Discharge Planning; ensure leukocytosis remains after interval of no Abx, continue working with therapies...Marland Kitchenhopefully ready for DC sometime early next week to SNF/Rehab. No rush nor guarantees in this complex case.    All of the above findings and recommendations were discussed with the patient, and the medical team, and all of patient's questions were answered to her expressed satisfaction.  -- Campbell Lerner, M.D.  [facility] 12/20/2019, 7:40 AM

## 2019-12-20 NOTE — Progress Notes (Signed)
Patient has order for rectal tube to be removed, patient updated and is uncomfortable with rectal tube coming out at this time and declined.  Will endorse to oncoming shift.

## 2019-12-21 ENCOUNTER — Inpatient Hospital Stay: Payer: BC Managed Care – PPO

## 2019-12-21 DIAGNOSIS — K651 Peritoneal abscess: Secondary | ICD-10-CM | POA: Diagnosis not present

## 2019-12-21 DIAGNOSIS — D649 Anemia, unspecified: Secondary | ICD-10-CM | POA: Diagnosis not present

## 2019-12-21 DIAGNOSIS — K5792 Diverticulitis of intestine, part unspecified, without perforation or abscess without bleeding: Secondary | ICD-10-CM | POA: Diagnosis not present

## 2019-12-21 MED ORDER — ENOXAPARIN SODIUM 120 MG/0.8ML ~~LOC~~ SOLN
110.0000 mg | SUBCUTANEOUS | Status: DC
  Administered 2019-12-22 – 2019-12-23 (×2): 110 mg via SUBCUTANEOUS
  Filled 2019-12-21 (×2): qty 0.8

## 2019-12-21 MED ORDER — IBUPROFEN 400 MG PO TABS: 800.0000 mg | ORAL_TABLET | Freq: Four times a day (QID) | ORAL | Status: DC | PRN

## 2019-12-21 MED ORDER — OXYCODONE HCL 5 MG PO TABS
10.0000 mg | ORAL_TABLET | Freq: Four times a day (QID) | ORAL | Status: DC | PRN
  Administered 2019-12-21: 10 mg via ORAL
  Filled 2019-12-21: qty 2

## 2019-12-21 MED ORDER — TRAMADOL HCL 50 MG PO TABS
100.0000 mg | ORAL_TABLET | Freq: Four times a day (QID) | ORAL | Status: DC | PRN
  Administered 2019-12-21 – 2019-12-23 (×9): 100 mg via ORAL
  Filled 2019-12-21 (×9): qty 2

## 2019-12-21 MED ORDER — IOPAMIDOL (ISOVUE-300) INJECTION 61%
10.0000 mL | Freq: Once | INTRAVENOUS | Status: AC | PRN
  Administered 2019-12-21: 10 mL

## 2019-12-21 NOTE — Progress Notes (Signed)
OT Cancellation Note  Patient Details Name: Amanda Reyes MRN: [id number] DOB: [birth year]   Cancelled Treatment:    Reason Eval/Treat Not Completed: Patient at procedure or test/ unavailable. Pt off the unit for x-ray. OT will attempt to follow up when pt is available.   Jackquline Denmark, MS, OTR/L , CBIS ascom [phone]  12/21/19, 3:12 PM   12/21/2019, 3:12 PM

## 2019-12-21 NOTE — Progress Notes (Signed)
Nutrition Follow-up  DOCUMENTATION CODES:   Morbid obesity  INTERVENTION:   Ensure Enlive po TID, each supplement provides 350 kcal and 20 grams of protein  NUTRITION DIAGNOSIS:   Inadequate oral intake related to inability to eat as evidenced by NPO status. -Resolved   GOAL:   Patient will meet greater than or equal to 90% of their needs -progressing   MONITOR:   PO intake, Supplement acceptance, Labs, Weight trends, Skin, I & O's  ASSESSMENT:   34 year old female with PMHx of HTN admitted with perforated diverticulitis with contained abscesses.  9/8 s/p robotic lysis of adhesions with irrigation and debridement of multiple intra-abdominal/peritoneal/pelvis abscesses with drain placement  Pt with improved appetite and oral intake; pt eating 100% of most meals and drinking most of her Ensure supplements. TPN discontinued last week. Drains with output. Rectal tube removed today. Pt is working with therapy. No new weight since 9/27. Plan is for possible discharge this week per MD note.   Medications reviewed and include: lovenox, protonix  Labs reviewed: Hgb 8.6(L), Hct 27.4(L), MCV 78.7(L), MCH 24.7(L)  Diet Order:   Diet Order            DIET SOFT Room service appropriate? Yes; Fluid consistency: Thin  Diet effective now                EDUCATION NEEDS:   No education needs have been identified at this time  Skin:  Skin Assessment: Skin Integrity Issues: (closed incisions to abdomen and vagina)  Last BM:  10/11- TYPE 6  Height:   Ht Readings from Last 1 Encounters:  11/09/19 5\' 9"  (1.753 m)   Weight:   Wt Readings from Last 1 Encounters:  12/07/19 (!) 220.4 kg   Ideal Body Weight:  65.9 kg  BMI:  Body mass index is 71.77 kg/m.  Estimated Nutritional Needs:   Kcal:  2700-3000kcal/day  Protein:  >135g/day  Fluid:  >/= 2 L/day  12/09/19 MS, RD, LDN Please refer to [facility] for RD and/or RD on-call/weekend/after hours pager

## 2019-12-21 NOTE — Progress Notes (Signed)
Mobility Specialist - Progress Note   12/21/19 1100  Mobility  Range of Motion/Exercises All extremities (Heel slides, SLR, ankle pumps, hip iso, arm raises)  Level of Assistance Independent  Assistive Device None  Distance Ambulated (ft) 0 ft  Mobility Response Tolerated well  Mobility performed by Mobility specialist  $Mobility charge 1 Mobility    Pre-mobility: 123 HR, 98% SpO2 Post-mobility: 128 HR, 94% SpO2   Pt was sitting in recliner upon arrival with mother present in room. Pt agreed to session. Pt denied any pain, nausea, or fatigue. Pt was able to perform seated exercises: ankle pumps (20x/leg), heel slides, straight leg raises, hip isometrics (10x/leg), arm raises, and crossover reach (10x/arm). Overall, pt tolerated session well. Pt was left in recliner with all needs in reach.    Filiberto Pinks Mobility Specialist 12/21/19, 12:01 PM

## 2019-12-21 NOTE — Procedures (Signed)
Interventional Radiology Procedure Note  Procedure: Fluoroscopic drain injection  Findings: Indwelling RLQ surgical drain with approximately 10 cm external to skin.  Unable to inject beyond external portion.  Complications: None immeidate  Estimated Blood Loss: None  Recommendations: -Decreased drainage due to malpositioning of drain. -Recommend drain removal. -IR will follow along with management of LLQ drain   Marliss Coots, MD Pager: [phone]

## 2019-12-21 NOTE — Progress Notes (Signed)
Date of Admission:  11/09/2019   T 11/18/19 robotic assisted  laparoscopy Antibiotic Meropenem 9/9>10/4 anidulaungin 9/9 >9/17 Fluconazole 11/27/19>>12/07/19 Linezolid 12/11/19>10/8 Ceftriaxone 10/5>>10/8  Culture8/30 BC -NG 9/3 intraabdominal abscess culture e.coli,  Streptococcus group C 11/27/19 -RT Upper quadrant abscess culture- lactobacillus and candida albicans 9/29 JP drain fluid- efeacium, e.coli  Foley -11/18/19>>12/14/19 PICC Rt arm 11/19/19>> Rt upper quadrant drain -11/27/19>>12/09/19 Rt LQ drain 11/18/19>> 12/21/19 Left LQ drain 12/07/19>> Rectal tube removed 10/11    Subjective: Pt sat in chair the whole day Walked with PT Says she is doing better tired now    Medications:  . amLODipine  10 mg Oral Daily  . Chlorhexidine Gluconate Cloth  6 each Topical Q0600  . [START ON 12/22/2019] enoxaparin (LOVENOX) injection  110 mg Subcutaneous Q24H  . feeding supplement (ENSURE ENLIVE)  237 mL Oral TID BM  . hydrochlorothiazide  25 mg Oral Daily  . mouth rinse  15 mL Mouth Rinse q12n4p  . pantoprazole (PROTONIX) IV  40 mg Intravenous Q12H  . sodium chloride flush  10-40 mL Intracatheter Q12H  . sodium chloride flush  5 mL Intracatheter Q8H  . sodium chloride flush  5 mL Intracatheter Q8H    Objective: Vital signs in last 24 hours: Temp:  [98.5 F (36.9 C)-98.9 F (37.2 C)] 98.5 F (36.9 C) (10/11 0918) Pulse Rate:  [96-110] 101 (10/11 0918) Resp:  [16-20] 20 (10/11 0918) BP: (117-149)/(77-95) 149/95 (10/11 0918) SpO2:  [95 %-100 %] 100 % (10/11 0918)   Patient Vitals for the past 24 hrs:  BP Temp Temp src Pulse Resp SpO2  12/21/19 0918 (!) 149/95 98.5 F (36.9 C) Oral (!) 101 20 100 %  12/21/19 0511 (!) 135/95 98.6 F (37 C) Oral 96 20 98 %  12/21/19 0002 137/77 98.9 F (37.2 C) Oral 100 20 96 %  12/20/19 2002 117/80 98.7 F (37.1 C) Oral (!) 105 20 95 %  12/20/19 1559 (!) 130/95 98.6 F (37 C) Oral (!) 110 16 100 %    PHYSICAL EXAM:  General:  Alert, cooperative, no distress, appears stated age.pale in chair Head: Normocephalic, without obvious abnormality, atraumatic. Eyes: Conjunctivae clear, anicteric sclerae. Pupils are equal ENT Nares normal. No drainage or sinus tenderness. Lips, mucosa, and tongue normal. No Thrush Lungs: b/l air entry-  Heart: Regular rate and rhythm, no murmur, rub or gallop. Abdomen: Soft, non-tender,not distended.  Left lower quadrant drain  Extremities: atraumatic, no cyanosis. No edema. No clubbing Skin: No rashes or lesions. Or bruising Lymph: Cervical, supraclavicular normal. Neurologic: Grossly non-focal  Lab Results     CBC Latest Ref Rng & Units 12/17/2019 12/16/2019 12/15/2019  WBC 4.0 - 10.5 K/uL 7.5 8.1 8.5  Hemoglobin 12.0 - 15.0 g/dL 6.9(G) 2.9(B) 7.3(L)  Hematocrit 36 - 46 % 27.4(L) 26.0(L) 24.3(L)  Platelets 150 - 400 K/uL 298 295 299   CMP Latest Ref Rng & Units 12/17/2019 12/15/2019 12/14/2019  Glucose 70 - 99 mg/dL 284(X) 324(M) 010(U)  BUN 6 - 20 mg/dL 15 17 17   Creatinine 0.44 - 1.00 mg/dL 7.25 3.66  Sodium 135 - 145 mmol/L 136 133(L) 135  Potassium 3.5 - 5.1 mmol/L 3.9 3.9 4.0  Chloride 98 - 111 mmol/L 97(L) 96(L) 97(L)  CO2 22 - 32 mmol/L 28 29 32  Calcium 8.9 - 10.3 mg/dL 8.2(L) 8.0(L) 8.0(L)  Total Protein 6.5 - 8.1 g/dL - - 7.9  Total Bilirubin 0.3 - 1.2 mg/dL - - 0.5  Alkaline Phos 38 - 126  U/L - - 144(H)  AST 15 - 41 U/L - - 30  ALT 0 - 44 U/L - - 79(H)    Assessment/Plan: Impression/recommendation Perforated diverticulitis with extensive intra-abdominal loop abscesses-admitted 11/09/19 Status post laparoscopy and washout.on 11/18/19 CT abdomen done9/16shows an abscess along rtlateral margin liverand organizing abscesses central abdominal mesentry - . IRplaced another drain9/17.Culturelactobacillus and rare candida albicans -was on meropenem which was changed to ceftriaxone 4 days ago CT abdomenwas donea new drain placed on 12/07/19 repeat  culture enterococcus faecium and e.coli addedlinezolid on 10/1 ( but not VRE) Leucocytosis resolved All antibiotics discontinued on 12/18/19 as there is good source control from the leaking Perforation/fistulawith one drain in the left lower quadrant  She currently has one drain in the LLQ   Anemia-received a unit of PRBC -   Morbid obesity  Patient has shown significant improvement in the past 7 days and now participating in PT, doing  incentive spirometry, sitting in chair  Discussedwith patient and her mother  ID will sign off- call if needed

## 2019-12-21 NOTE — Progress Notes (Signed)
[facility] SURGICAL PROGRESS NOTE  Hospital Day(s): 42.   Post op day(s): 33 Days Post-Op.   Interval History:  Patient seen and examined no acute events or new complaints overnight.  Patient biggest concerns this morning were confusion over pain medications, and plan for what she will get at home No nausea, emesis She remains afebrile No new labs this morning Drain output as follows:  - RLQ:not recorded - LLQ:150 mLs,appears morefeculent Off ABx since 10/08; ID following Working with therapies; recommendations are SNF; she reports she has been getting to edge of bed and working on getting OOB/Standing She is more agreeable with rectal tube removal today  Vital signs in last 24 hours: [min-max] current  Temp:  [98.6 F (37 C)-98.9 F (37.2 C)] 98.6 F (37 C) (10/11 0511) Pulse Rate:  [96-110] 96 (10/11 0511) Resp:  [16-20] 20 (10/11 0511) BP: (117-137)/(77-95) 135/95 (10/11 0511) SpO2:  [95 %-100 %] 98 % (10/11 0511)     Height: 5\' 9"  (175.3 cm) Weight: (!) 220.4 kg BMI (Calculated): 71.74   Intake/Output last 2 shifts:  10/10 0701 - 10/11 0700 In: 10  Out: 1350 [Urine:1200; Drains:150]   Physical Exam:  Constitutional: alert, cooperative and no distress Respiratory:Breathing appears to be improving, on RA, still with some intermittent conversational dyspnea but again this has been her baseline herein the hospital Cardiovascular:Regular rate and sinus rhythm  Gastrointestinal:Obese, soft,no appreciable tenderness aside from drains, non-distended. Rectal tube in place.RLQ drain without output, LLQ drain withfeculent output. Musculoskeletal: 1+ edema to bilateral LE Integumentary:Laparoscopic incisions arewell healed  Labs:  CBC Latest Ref Rng & Units 12/17/2019 12/16/2019 12/15/2019  WBC 4.0 - 10.5 K/uL 7.5 8.1 8.5  Hemoglobin 12.0 - 15.0 g/dL 02/14/2020) 9.9(M) 7.3(L)  Hematocrit 36 - 46 % 27.4(L) 26.0(L) 24.3(L)   Platelets 150 - 400 K/uL 298 295 299   CMP Latest Ref Rng & Units 12/17/2019 12/15/2019 12/14/2019  Glucose 70 - 99 mg/dL 02/13/2020) 834(H) 962(I)  BUN 6 - 20 mg/dL 15 17 17   Creatinine 0.44 - 1.00 mg/dL 297(L 8.92  Sodium 135 - 145 mmol/L 136 133(L) 135  Potassium 3.5 - 5.1 mmol/L 3.9 3.9 4.0  Chloride 98 - 111 mmol/L 97(L) 96(L) 97(L)  CO2 22 - 32 mmol/L 28 29 32  Calcium 8.9 - 10.3 mg/dL 8.2(L) 8.0(L) 8.0(L)  Total Protein 6.5 - 8.1 g/dL - - 7.9  Total Bilirubin 0.3 - 1.2 mg/dL - - 0.5  Alkaline Phos 38 - 126 U/L - - 144(H)  AST 15 - 41 U/L - - 30  ALT 0 - 44 U/L - - 79(H)     Imaging studies: No new pertinent imaging studies   Assessment/Plan:  34 y.o. 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 33 Days Post-Op s/p robotic assisted laparoscopic lysis of adhesion and drainage of intra-abdominal abscess WITHOUT evidence of perforation or frank stoolforperforated diverticulitis, complicated by hypoxic respiratory failure requiring post-operative intubation(since extubated)   - Changes today 10/11: Okay with rectal tube removal today, we will try to stop IV pain medications, continue with therapies              - Okay to continue soft/regular diet + nutritional supplementation - Off ABx since 10/08; no fevers - Continue RLQ/LLQ drains; monitor and record output --> RLQ output slowly, certainly would recommend drain study to rule out fistula/undrained abscess prior to removal - No plans for surgical re-intervention at this time - Monitor abdominal examination;  on-going bowel function - Pain control prn; antiemetics prn -Monitor H&H; s/p transfusion 1 unit pRBC on 09/21-->only re-transfuse if "absolutely necessary" per patient request - Home medications; continue; appreciate hospitalist consultation -Mobilization encouraged as tolerated;  PT/OT following; recommending HH - DVT prophylaxis  - Discharge Planning; remove rectal tube today, ensure she remains afebrile of Abx, continue working with therapies.Marland KitchenMarland KitchenMarland KitchenMy goal would be for discharge in the middle of this week (ex: 10/13)  All of the above findings and recommendations were discussed with the patient, and the medical team, and all of patient's questions were answered toherexpressed satisfaction.  -- Lynden Oxford, PA-C [facility] 12/21/2019, 7:49 AM [phone] M-F: 7am - 4pm

## 2019-12-21 NOTE — Progress Notes (Signed)
PHARMACIST - PHYSICIAN COMMUNICATION  CONCERNING:  Enoxaparin (Lovenox) for DVT Prophylaxis    RECOMMENDATION: Patient was prescribed enoxaprin 40mg  q24 hours for VTE prophylaxis.   Filed Weights   12/03/19 0423 12/05/19 0438 12/07/19 0454  Weight: (!) 198 kg (436 lb 8.2 oz) (!) 224.5 kg (495 lb) (!) 220.4 kg (486 lb)    Body mass index is 71.77 kg/m.  Estimated Creatinine Clearance: 202 mL/min (by C-G formula based on SCr of 0.52 mg/dL).    Based on [facility] policy patient is candidate for enoxaparin 0.5mg /kg TBW SQ every 24 hours based on BMI being >30.   DESCRIPTION: Pharmacy has adjusted enoxaparin dose per [facility] policy.  Patient is now receiving enoxaparin __110__mg every _24__ hours    [facility], PharmD, BCPS Clinical Pharmacist 12/21/2019 2:47 PM

## 2019-12-21 NOTE — Progress Notes (Signed)
°   12/21/19 1100  Clinical Encounter Type  Visited With Patient and family together;Health care provider  Visit Type Follow-up  Referral From Chaplain  Consult/Referral To Chaplain  While rounding Chaplain checked in on Pt. When chaplain arrived, Pt was sitting in chair, smiling. Chaplain told her to keep doing whatever it is that she is doing because she looks great. Chaplain asked Pt how she feels and she said good. AWESOME!!! Chaplain will follow up on PT later.

## 2019-12-22 NOTE — Progress Notes (Signed)
Physical Therapy Treatment Patient Details Name: Amanda Reyes MRN: [id number] DOB: 01/06/1986 Today's Date: 12/22/2019    History of Present Illness Pt is 34 year old female with past medical history for morbid obesity and hypertension, admitted to ICU due to acute diverticulitis and microperforation.  Patient is s/p of robotic surgery for lysis of adhesions.    PT Comments    Pt in chair upon entry. Exercises to improve leg strength, independence, safety with transfers. Commenced static standing balance training with pt reporting wooziness, hot flash. Vitals show elevated HR (130s) and elevated BP (DBP 90s seated, 130s standing). Session terminated, RN notified. Pt has no LOB in session during hands free standing, but RW is maintain nearby for ad lib UE assist in righting.    Follow Up Recommendations  Home health PT;Supervision for mobility/OOB;Supervision - Intermittent     Equipment Recommendations  None recommended by PT    Recommendations for Other Services       Precautions / Restrictions Precautions Precautions: Fall Restrictions Weight Bearing Restrictions: No    Mobility  Bed Mobility Overal bed mobility: Needs Assistance Bed Mobility: Supine to Sit     Supine to sit: Min guard     General bed mobility comments: in chair at entry  Transfers Overall transfer level: Needs assistance Equipment used: Rolling walker (2 wheeled) Transfers: Sit to/from Stand Sit to Stand: Supervision Stand pivot transfers: Min guard       General transfer comment: Uses arms of chair, BRW in place for safety use prn  Ambulation/Gait Ambulation/Gait assistance:  (deferred due to vitals, hot flash, woozieness,)               Stairs             Wheelchair Mobility    Modified Rankin (Stroke Patients Only)       Balance Overall balance assessment: Needs assistance Sitting-balance support: Feet supported;No upper extremity supported Sitting balance-Leahy  Scale: Normal Sitting balance - Comments: no LOB   Standing balance support: Bilateral upper extremity supported;During functional activity Standing balance-Leahy Scale: Good Standing balance comment: reliance on RW                            Cognition Arousal/Alertness: Awake/alert Behavior During Therapy: WFL for tasks assessed/performed Overall Cognitive Status: Within Functional Limits for tasks assessed                                 General Comments: Pt very pleasant and cooperative throughout      Exercises Other Exercises Other Exercises: 2x5 STS from chair + pillow+blanket, BUE push off from chair rest between sets Other Exercises: Standing balance activity x2 minutes    General Comments        Pertinent Vitals/Pain Pain Assessment: No/denies pain    Home Living Family/patient expects to be discharged to:: Private residence                    Prior Function            PT Goals (current goals can now be found in the care plan section) Acute Rehab PT Goals Patient Stated Goal: to go home tomorrow PT Goal Formulation: With patient Time For Goal Achievement: 12/31/19 Potential to Achieve Goals: Fair Progress towards PT goals: Not progressing toward goals - comment    Frequency    Min 2X/week  PT Plan Current plan remains appropriate    Co-evaluation              AM-PAC PT "6 Clicks" Mobility   Outcome Measure  Help needed turning from your back to your side while in a flat bed without using bedrails?: A Little Help needed moving from lying on your back to sitting on the side of a flat bed without using bedrails?: A Little Help needed moving to and from a bed to a chair (including a wheelchair)?: A Little Help needed standing up from a chair using your arms (e.g., wheelchair or bedside chair)?: A Little Help needed to walk in hospital room?: A Little Help needed climbing 3-5 steps with a railing? : A  Lot 6 Click Score: 17    End of Session Equipment Utilized During Treatment: Gait belt Activity Tolerance: No increased pain;Treatment limited secondary to medical complications (Comment) Patient left: in chair;with family/visitor present Nurse Communication: Mobility status PT Visit Diagnosis: Muscle weakness (generalized) (M62.81);Difficulty in walking, not elsewhere classified (R26.2) Pain - Right/Left: Right Pain - part of body:  (Rt ABD)     Time: 6433-2951 PT Time Calculation (min) (ACUTE ONLY): 24 min  Charges:  $Therapeutic Exercise: 23-37 mins                     1:07 PM, 12/22/19 Rosamaria Lints, PT, DPT Physical Therapist - [facility]  [phone] (ASCOM)    Edilson Vital C 12/22/2019, 1:05 PM

## 2019-12-22 NOTE — Progress Notes (Signed)
[facility] SURGICAL PROGRESS NOTE  Hospital Day(s): 62.   Post op day(s): 34 Days Post-Op.   Interval History:  Patient seen and examined no acute events or new complaints overnight, RLQ drain removed late in day following drain study  Patient reports she is ding well this morning, good pain management No nausea, emesis She remains afebrile No new labs this morning Drain output as follows:  - LLQ:150 mLs,appears morefeculent Off ABx since 10/08; ID signed off Working with therapies; recommendations are now [facility]; she has been more motivated in the last few days Tentative plan for discharge tomorrow (10/13)  Vital signs in last 24 hours: [min-max] current  Temp:  [98.2 F (36.8 C)-98.8 F (37.1 C)] 98.2 F (36.8 C) (10/12 0603) Pulse Rate:  [100-110] 100 (10/12 0603) Resp:  [16-20] 18 (10/12 0603) BP: (104-149)/(58-95) 121/69 (10/12 0603) SpO2:  [94 %-100 %] 100 % (10/12 0603)     Height: 5\' 9"  (175.3 cm) Weight: (!) 220.4 kg BMI (Calculated): 71.74   Intake/Output last 2 shifts:  10/11 0701 - 10/12 0700 In: 135 [P.O.:120] Out: 300 [Drains:300]   Physical Exam:  Constitutional: alert, cooperative and no distress Respiratory:Breathing appears to be improving, on RA, no respiratory distress Cardiovascular:Regular rate and sinus rhythm  Gastrointestinal:Obese, soft,no appreciable tenderness aside from drains, non-distended, LLQ drain withfeculent output. Integumentary:Laparoscopic incisions arewell healed   Labs:  CBC Latest Ref Rng & Units 12/17/2019 12/16/2019 12/15/2019  WBC 4.0 - 10.5 K/uL 7.5 8.1 8.5  Hemoglobin 12.0 - 15.0 g/dL 02/14/2020) 8.6(V) 7.3(L)  Hematocrit 36 - 46 % 27.4(L) 26.0(L) 24.3(L)  Platelets 150 - 400 K/uL 298 295 299   CMP Latest Ref Rng & Units 12/17/2019 12/15/2019 12/14/2019  Glucose 70 - 99 mg/dL 02/13/2020) 696(E) 952(W)  BUN 6 - 20 mg/dL 15 17 17   Creatinine 0.44 - 1.00 mg/dL 413(K 4.40  Sodium 135 - 145  mmol/L 136 133(L) 135  Potassium 3.5 - 5.1 mmol/L 3.9 3.9 4.0  Chloride 98 - 111 mmol/L 97(L) 96(L) 97(L)  CO2 22 - 32 mmol/L 28 29 32  Calcium 8.9 - 10.3 mg/dL 8.2(L) 8.0(L) 8.0(L)  Total Protein 6.5 - 8.1 g/dL - - 7.9  Total Bilirubin 0.3 - 1.2 mg/dL - - 0.5  Alkaline Phos 38 - 126 U/L - - 144(H)  AST 15 - 41 U/L - - 30  ALT 0 - 44 U/L - - 79(H)     Imaging studies: No new pertinent imaging studies   Assessment/Plan:  34 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 33 Days Post-Ops/p robotic assisted laparoscopic lysis of adhesion and drainage of intra-abdominal abscess WITHOUT evidence of perforation or frank stoolforperforated diverticulitis, complicated by hypoxic respiratory failure requiring post-operative intubation(since extubated)   - Changes today 10/12: Continue to work with therapies today and prepare for discharge tomorrow   - Okay to continue soft/regular diet + nutritional supplementation - Off ABx since 10/08; no fevers - Continue LLQ drain; monitor and record output - No plans for surgical re-intervention at this time - Monitor abdominal examination; on-going bowel function - Pain control prn; antiemetics prn - Home medications; continue; appreciate hospitalist consultation -Mobilization encouraged as tolerated; PT/OT following; recommending HH - DVT prophylaxis  - Discharge Planning; Plan for discharge home with home health tomorrow, 10/13  All of the above findings and recommendations were discussed with the patient, and the medical team, and all of patient's questions were answered toherexpressed satisfaction.  -- 12/08,  PA-C [facility] 12/22/2019, 7:23 AM [phone] M-F: 7am - 4pm

## 2019-12-22 NOTE — Progress Notes (Signed)
Occupational Therapy Treatment Patient Details Name: MACLAINE AHOLA MRN: [id number] DOB: 1985-06-14 Today's Date: 12/22/2019    History of present illness Pt is 34 year old female with past medical history for morbid obesity and hypertension, admitted to ICU due to acute diverticulitis and microperforation.  Patient is s/p of robotic surgery for lysis of adhesions.   OT comments  Upon entering the room, pt supine in bed with mother present. Pt with no c/o pain and motivated for OT intervention. Pt performing supine >sit with use of bed rail and min guard. Pt sitting on EOB and standing with min A and min cuing for hand placement. Pt taking several steps and pivoting to sit in recliner chair with min guard. Pt demonstrated controlled descent into recliner chair as well. OT provided paper handout for B UE strengthening exercises. Pt returning demonstrations with use of level 2 band and min cuing for proper technique and hand placement. Pt performing 2 sets of 10 chest pulls, shoulder diagonals, shoulder elevation, bicep curls, and alternating punches with cuing for pursed lip breathing. Theraband and handout left with pt for her to work on in her room. OT discussed recommendation and expectation for [facility] services and discharge and pt was agreeable. Pt remained in recliner chair with all needs within reach.   Follow Up Recommendations  Home health OT;Supervision/Assistance - 24 hour    Equipment Recommendations  None recommended by OT       Precautions / Restrictions Precautions Precautions: Fall       Mobility Bed Mobility Overal bed mobility: Needs Assistance Bed Mobility: Supine to Sit     Supine to sit: Min guard        Transfers Overall transfer level: Needs assistance Equipment used: Rolling walker (2 wheeled) Transfers: Sit to/from [employer] Sit to Stand: Min guard Stand pivot transfers: Min guard       General transfer comment: min cuing for hand  placement with sit <>stand from bed    Balance Overall balance assessment: Needs assistance Sitting-balance support: Feet supported;Bilateral upper extremity supported Sitting balance-Leahy Scale: Good Sitting balance - Comments: no LOB   Standing balance support: Bilateral upper extremity supported;During functional activity Standing balance-Leahy Scale: Fair Standing balance comment: reliance on RW          ADL either performed or assessed with clinical judgement        Vision Patient Visual Report: No change from baseline            Cognition Arousal/Alertness: Awake/alert Behavior During Therapy: WFL for tasks assessed/performed Overall Cognitive Status: Within Functional Limits for tasks assessed       General Comments: Pt very pleasant and cooperative throughout                   Pertinent Vitals/ Pain       Pain Assessment: No/denies pain  Home Living Family/patient expects to be discharged to:: Private residence              Frequency  Min 1X/week        Progress Toward Goals  OT Goals(current goals can now be found in the care plan section)  Progress towards OT goals: Progressing toward goals  Acute Rehab OT Goals Patient Stated Goal: to go home tomorrow OT Goal Formulation: With patient/family Time For Goal Achievement: 12/31/19 Potential to Achieve Goals: Good  Plan Discharge plan remains appropriate       AM-PAC OT "6 Clicks" Daily Activity     Outcome  Measure   Help from another person eating meals?: None Help from another person taking care of personal grooming?: None Help from another person toileting, which includes using toliet, bedpan, or urinal?: A Lot Help from another person bathing (including washing, rinsing, drying)?: A Lot Help from another person to put on and taking off regular upper body clothing?: A Little Help from another person to put on and taking off regular lower body clothing?: A Lot 6 Click Score:  17    End of Session Equipment Utilized During Treatment: Rolling walker  OT Visit Diagnosis: Unsteadiness on feet (R26.81);Muscle weakness (generalized) (M62.81)   Activity Tolerance Patient tolerated treatment well   Patient Left in chair;with call bell/phone within reach;with family/visitor present   Nurse Communication Mobility status        Time: 0998-3382 OT Time Calculation (min): 38 min  Charges: OT General Charges $OT Visit: 1 Visit OT Treatments $Therapeutic Activity: 8-22 mins $Therapeutic Exercise: 23-37 mins  Jackquline Denmark, MS, OTR/L , CBIS ascom [phone]  12/22/19, 11:08 AM

## 2019-12-22 NOTE — Progress Notes (Signed)
   12/22/19 1218  Therapy Vitals  Patient Position (if appropriate) Orthostatic Vitals  Orthostatic Sitting  BP- Sitting (!) 148/96  Pulse- Sitting 125  Orthostatic Standing at 0 minutes  BP- Standing at 0 minutes (!) 155/123  Pulse- Standing at 0 minutes 138   Attempted to perform standing balance training this date during PT session. Pt reported becoming hot, feeling 'woozie' both times. Sitting/standing BP assessed. Session terminated.   12:56 PM, 12/22/19 Rosamaria Lints, PT, DPT Physical Therapist - [facility]  [phone] [facility])

## 2019-12-22 NOTE — Progress Notes (Signed)
   12/22/19 1250  Clinical Encounter Type  Visited With Patient and family together  Visit Type Follow-up  Referral From Chaplain  Consult/Referral To Chaplain  Chaplain briefly stopped and visited with Pt. Pt's mother said she is going home tomorrow and therapy will be done at home. Pt was laying down, trying to situate herself in the bed because she is just getting back in the bed. Pt was smiling and talking. Chaplain will follow up with Pt tomorrow morning.

## 2019-12-23 MED ORDER — HEPARIN SOD (PORK) LOCK FLUSH 100 UNIT/ML IV SOLN
500.0000 [IU] | Freq: Once | INTRAVENOUS | Status: DC
  Filled 2019-12-23: qty 5

## 2019-12-23 MED ORDER — OXYCODONE HCL 10 MG PO TABS
10.0000 mg | ORAL_TABLET | Freq: Four times a day (QID) | ORAL | 0 refills | Status: DC | PRN
Start: 2019-12-23 — End: 2020-01-05

## 2019-12-23 MED ORDER — TRAMADOL HCL 50 MG PO TABS
100.0000 mg | ORAL_TABLET | Freq: Four times a day (QID) | ORAL | 0 refills | Status: DC | PRN
Start: 2019-12-23 — End: 2019-12-30

## 2019-12-23 MED ORDER — METHOCARBAMOL 500 MG PO TABS: 500.0000 mg | ORAL_TABLET | Freq: Four times a day (QID) | ORAL | 0 refills | Status: AC | PRN

## 2019-12-23 MED ORDER — IBUPROFEN 800 MG PO TABS: 800.0000 mg | ORAL_TABLET | Freq: Four times a day (QID) | ORAL | 0 refills | Status: DC | PRN

## 2019-12-23 NOTE — TOC Transition Note (Signed)
Transition of Care [facility]) - CM/SW Discharge Note   Patient Details  Name: TASNIM BALENTINE MRN: [id number] Date of Birth: 1985/08/07  Transition of Care [facility]) CM/SW Contact:  Chapman Fitch, RN Phone Number: 12/23/2019, 12:42 PM   Clinical Narrative:     Patient to discharge home today Barbara Cower with [facility] aware of discharge today and that patient will discharge with drain in place  Patient's mother has already taken DME home   Final next level of care: Home w Home Health Services Barriers to Discharge: No Barriers Identified   Patient Goals and CMS Choice        Discharge Placement                       Discharge Plan and Services                DME Arranged: Walker rolling, 3-N-1 DME Agency: [employer]       HH Arranged: RN, PT, OT, Nurse's Aide HH Agency: [facility] Date HH Agency Contacted: 12/23/19   Representative spoke with at [facility] Agency: Barbara Cower  Social Determinants of Health (SDOH) Interventions     Readmission Risk Interventions Readmission Risk Prevention Plan 12/18/2019  Transportation Screening Complete  HRI or Home Care Consult Complete  Palliative Care Screening Not Applicable  Medication Review (RN Care Manager) Complete  Some recent data might be hidden

## 2019-12-23 NOTE — Progress Notes (Addendum)
   12/23/19 1300  Clinical Encounter Type  Visited With Patient and family together  Visit Type Follow-up  Referral From Chaplain  Consult/Referral To Chaplain  Chaplain stopped in to check on Pt. Pt's mother was on her phone, therefore chaplain talked to only talked to Chaplain asked if she was being discharged and Pt said she is waiting on her discharge papers. Chaplain  Wished her well.

## 2019-12-23 NOTE — Progress Notes (Signed)
Mobility Specialist - Progress Note   12/23/19 1148  Mobility  Activity Refused mobility  Mobility performed by Mobility specialist    Pt refused mobility session d/t being discharged today. Pt feels confident and has no concerns about mobilizing at home.      Ida Milbrath Mobility Specialist  12/23/19, 11:48 AM

## 2019-12-23 NOTE — Discharge Summary (Signed)
[facility] SURGICAL DISCHARGE SUMMARY  Patient ID: Amanda Reyes MRN: [id number] DOB/AGE: 34-Mar-1987 34 y.o.  Admit date: 11/09/2019 Discharge date: 12/23/2019  Discharge Diagnoses Patient Active Problem List   Diagnosis Date Noted  . Pressure injury of skin 12/16/2019  . Acute respiratory failure with hypoxia and hypercapnia (HCC) 12/14/2019  . Essential hypertension 12/14/2019  . Diverticulitis of large intestine with perforation and abscess 11/23/2019  . Diverticulitis of colon with perforation 11/09/2019  . Obesity 08/01/2012    Consultants Interventional Radiology Urology PCCM Hospitalist Infectious disease   Procedures 11/18/2019 - Dr Apolinar Junes Cystoscopy Bilateral retrograde pyelogram of ICG for the purpose of ureteral identification  11/18/2019 - Dr Claudine Mouton:  Robotic lysis of adhesions with irrigation and debridement of multiple intra-abdominal/peritoneal/pelvic abscesses with drain placement.   HPI: 34 y.o. female presented to [facility] ED yesterday for abdominal pain. Patient reports she initially noticed some lower abdominal pain around 2-3 weeks ago however this was not severe and only intermittent in nature. In the last 2-3 days, her pain has become more severe and constant. Nothing seems to make this better and movement/palpation exacerbate her pain. She has been febrile here with T-Max 101.2 and nauseous. She denied any associated  chills, emesis, cough, congestion, SOB, CP, or bowel changes. She denied any history of similar presentations in the past. No previous colonoscopy. Previous abdominal surgeries include cholecystectomy for choledocholithiasis in 2014. Work up in the ED was concerning for leukocytosis to 13.5K, AKI with sCr - 1.75, lactic acidosis (3.1 --> 2.7), and CT Abdomen/Pelvis was concerning for sigmoid diverticulitis with foci of extraluminal air concerning for perforation.   Hospital Course:  Patient was admitted to general  surgery service and conservative management with IV ABX, NPO was attempted. She had issues with fevers intermittently the first few days but made some clinical progress. She underwent repeat CT Abdomen/Pelvis on 09/02 and this was concerning for intra-abdominal fluid collections. Ultimately, she had IR drainage 09/03. Unfortunately, she failed to make any further clinical improvements and continued to have tachycardia, fevers, and leukocytosis. On 09/08, she underwent robotic assisted laparoscopic exploration which revealed multiple loculated abscesses and fibrinous coating to the bowels. No feculence appreciate intra-operatively. She was admitted to the ICU post-operative and required intubation. ID was brought on board on HD10. She was able to be extubated on 09/10. She had NGT placed intra-operatively and this was removed on 09/14. She had follow up CT post-operatively which was removed on 09/16, which was concerning for fluid near the liver in the RUQ. This was percutaneously drained on 09/17 with interventional radiology. She additionally required TPN post-operatively. She ultimately developed a LLQ colonic fistula and had drain placement on 09/27 which ultimately provided control. She continued on ABx until 10/08 and they were stopped at that time. Her leukocytosis completely resolved on 10/01 and she remained afebrile. Her remain other three drains were sequentially removed as output cleared and slowed. At time of discharge, she only had LLQ drain. Major issue while hospitalized as deconditioning. She progressed well with physical therapy and ultimately was able to go home with home health.   At the time of discharge, her pain was managed well with PO pain medications, she remained without fever or leukocytosis, her drain was well managed, and she had progressed to home health with therapies. She understands that she will need to continue to work with therapies at home. She and her family were educated on  drain management. We will be happy to see her as  an outpatient however I believe ultimately she will best be served at a tertiary center for definitive management and any potential surgical intervention given insufficient resources at our hospital for her very complex case.    Discharge Condition: Good   Physical Examination:  Constitutional: alert, cooperative and no distress Respiratory:Breathing appears to be improving, on RA, no respiratory distress Cardiovascular:Regular rate and sinus rhythm  Gastrointestinal:Obese, soft,no appreciable tenderness aside from drains, non-distended, LLQ drain withfeculent output. Integumentary:Laparoscopic incisions arewell healed   Allergies as of 12/23/2019      Reactions   Penicillins Hives   Did it involve swelling of the face/tongue/throat, SOB, or low BP? No Did it involve sudden or severe rash/hives, skin peeling, or any reaction on the inside of your mouth or nose? Yes Did you need to seek medical attention at a hospital or doctor's office? No When did it last happen? If all above answers are "NO", may proceed with cephalosporin use..3   Tomato Rash      Medication List    STOP taking these medications   azithromycin 250 MG tablet Commonly known as: Zithromax Z-Pak     TAKE these medications   ibuprofen 800 MG tablet Commonly known as: ADVIL Take 1 tablet (800 mg total) by mouth every 6 (six) hours as needed for fever, headache, mild pain or moderate pain.   losartan-hydrochlorothiazide 100-25 MG tablet Commonly known as: Hyzaar Take 1 tablet by mouth daily.   methocarbamol 500 MG tablet Commonly known as: ROBAXIN Take 1 tablet (500 mg total) by mouth every 6 (six) hours as needed for muscle spasms.   ondansetron 4 MG tablet Commonly known as: Zofran Take 1 tablet (4 mg total) by mouth daily as needed.   Oxycodone HCl 10 MG Tabs Take 1 tablet (10 mg total) by mouth every 6 (six) hours as needed for  breakthrough pain.   traMADol 50 MG tablet Commonly known as: ULTRAM Take 2 tablets (100 mg total) by mouth every 6 (six) hours as needed for moderate pain or severe pain.            Durable Medical Equipment  (From admission, onward)         Start     Ordered   12/17/19 1447  For home use only DME Walker rolling  Once       Comments: Bariatric  Question Answer Comment  Walker: With 5 Inch Wheels   Patient needs a walker to treat with the following condition Weakness      12/17/19 1447   12/17/19 1447  For home use only DME Bedside commode  Once       Comments: Bariatric  Question:  Patient needs a bedside commode to treat with the following condition  Answer:  Weakness   12/17/19 1447            Follow-up Information    Campbell Lerner, MD. Schedule an appointment as soon as possible for a visit in 2 week(s).   Specialty: General Surgery Why: Hospital follow up, complicated diverticulitis, has drain Contact information: [address] [place] [address] [phone]                Time spent on discharge management including discussion of hospital course, clinical condition, outpatient instructions, prescriptions, and follow up with the patient and members of the medical team: >30 minutes  -- Lynden Oxford , PA-C [facility]  12/23/2019, 11:45 AM [phone] M-F: 7am - 4pm

## 2019-12-28 ENCOUNTER — Telehealth: Payer: Self-pay | Admitting: Surgery

## 2019-12-28 NOTE — Telephone Encounter (Signed)
Spoke with the patient and she is wanting a refill on her Tramadol. I sent a message to Lynden Oxford PA-C and he will refill this tomorrow when he gets to the hospital. Patient notified.

## 2019-12-28 NOTE — Telephone Encounter (Signed)
Patient is calling in regards to a medication that was sent into the pharmacy said that she would need a refill on it, please call patient and advise.

## 2019-12-30 ENCOUNTER — Telehealth: Payer: Self-pay | Admitting: Surgery

## 2019-12-30 ENCOUNTER — Other Ambulatory Visit: Payer: Self-pay | Admitting: Physician Assistant

## 2019-12-30 MED ORDER — TRAMADOL HCL 50 MG PO TABS
100.0000 mg | ORAL_TABLET | Freq: Four times a day (QID) | ORAL | 0 refills | Status: DC | PRN
Start: 2019-12-30 — End: 2020-01-05

## 2019-12-30 NOTE — Telephone Encounter (Signed)
Patient is calling and said she was still waiting on an rx to be sent into the pharmacy for her tramadol patient is using [facility] [address] of [place]. Please call patient and advise.

## 2019-12-30 NOTE — Telephone Encounter (Signed)
Rx sent to pharmacy. Pt made aware and voiced understanding.

## 2020-01-04 ENCOUNTER — Telehealth: Payer: Self-pay | Admitting: Surgery

## 2020-01-04 MED ORDER — IBUPROFEN 800 MG PO TABS: 800.0000 mg | ORAL_TABLET | Freq: Four times a day (QID) | ORAL | 0 refills | Status: DC | PRN

## 2020-01-04 NOTE — Telephone Encounter (Signed)
A refill of her Ibuprofen has been sent into the pharmacy. The patient has been made aware.

## 2020-01-04 NOTE — Telephone Encounter (Signed)
Patients mother came by the office to drop some paper, patient said her daughter was needing a refill on her ibuprofen 800mg . Please call patient and advise.

## 2020-01-05 ENCOUNTER — Telehealth: Payer: Self-pay | Admitting: Surgery

## 2020-01-05 ENCOUNTER — Other Ambulatory Visit: Payer: Self-pay | Admitting: Surgery

## 2020-01-05 MED ORDER — TRAMADOL HCL 50 MG PO TABS
100.0000 mg | ORAL_TABLET | Freq: Four times a day (QID) | ORAL | 0 refills | Status: DC | PRN
Start: 2020-01-05 — End: 2020-01-06

## 2020-01-05 MED ORDER — OXYCODONE HCL 10 MG PO TABS
10.0000 mg | ORAL_TABLET | Freq: Four times a day (QID) | ORAL | 0 refills | Status: DC | PRN
Start: 2020-01-05 — End: 2020-01-06

## 2020-01-05 NOTE — Telephone Encounter (Signed)
Mother calls for her daughter Amanda Reyes stating that they now need a refill on her Oxycodone and Tramadol.  Please call.  Thank you.

## 2020-01-06 ENCOUNTER — Other Ambulatory Visit: Payer: Self-pay | Admitting: Surgery

## 2020-01-06 ENCOUNTER — Telehealth: Payer: Self-pay | Admitting: *Deleted

## 2020-01-06 MED ORDER — TRAMADOL HCL 50 MG PO TABS
100.0000 mg | ORAL_TABLET | Freq: Four times a day (QID) | ORAL | 0 refills | Status: DC | PRN
Start: 2020-01-06 — End: 2020-01-18

## 2020-01-06 MED ORDER — OXYCODONE HCL 10 MG PO TABS
10.0000 mg | ORAL_TABLET | Freq: Four times a day (QID) | ORAL | 0 refills | Status: DC | PRN
Start: 2020-01-06 — End: 2020-01-18

## 2020-01-06 NOTE — Telephone Encounter (Signed)
I have sent a message to Dr Claudine Mouton about this. The prescriptions sent to [facility] in [place] have been cancelled by the pharmacist.

## 2020-01-06 NOTE — Telephone Encounter (Signed)
Faxed FMLA to [employer] at [phone] 

## 2020-01-06 NOTE — Telephone Encounter (Signed)
Patient called back this morning in regards to tramadol. She stated that it was called in to the wrong pharmacy. It was called into [facility] in [facility] and its suppose to be called into [facility] on [address] in [place]. Please call patient once this is fixed.

## 2020-01-07 ENCOUNTER — Ambulatory Visit (INDEPENDENT_AMBULATORY_CARE_PROVIDER_SITE_OTHER): Payer: BC Managed Care – PPO | Admitting: Surgery

## 2020-01-07 ENCOUNTER — Encounter: Payer: Self-pay | Admitting: Surgery

## 2020-01-07 ENCOUNTER — Other Ambulatory Visit: Payer: Self-pay

## 2020-01-07 VITALS — BP 170/114 | HR 134 | Temp 98.3°F | Resp 14 | Ht 69.0 in | Wt 350.0 lb

## 2020-01-07 DIAGNOSIS — K5720 Diverticulitis of large intestine with perforation and abscess without bleeding: Secondary | ICD-10-CM

## 2020-01-07 NOTE — Progress Notes (Signed)
[facility] POST-OP OFFICE VISIT  01/07/2020  HPI: Amanda Reyes is a 34 y.o. female 50 days s/p a robotic lysis of adhesions and drainage of pelvic abscesses.  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  The output continues to be relatively low, and somewhat inconsistent from day to day.  The staining remains on the tubing and the bulb which is quite consistent with enteric contents, however the output itself appears quite watery.  She denies fevers and chills.  She denies any cough, vomiting or chest pain.  He still have some abdominal pain discomfort primarily limited to her drain sites.  She reports she cannot taste her food as she used to and so has to force herself to get the protein intake that is been recommended.  But she is able to do that through drinking Ensure and other supplements.  She has not had any catheter or drain change, it was last evaluated on October 11.  Vital signs: BP (!) 170/114    Pulse (!) 134    Temp 98.3 F (36.8 C) (Oral)    Resp 14    Ht 5\' 9"  (1.753 m)    Wt (!) 350 lb (158.8 kg)    BMI 51.69 kg/m    Physical Exam: Constitutional: She appears quite well and bright today. Abdomen: Obese, her incisions have all healed nicely.  Right lower quadrant drain site just had a spot of drainage on the dressing, but otherwise no induration and no palpable scar tract.  The drain is well secured in the left lower quadrant and appears to be functioning well. Skin: No areas of erythema, or induration, or infection.  Assessment/Plan: This is a 34 y.o. female 50 days s/p robotic lysis of adhesions and drainage of peritoneal abscesses.  Patient Active Problem List   Diagnosis Date Noted   Pressure injury of skin 12/16/2019   Acute respiratory failure with hypoxia and hypercapnia (HCC) 12/14/2019   Essential  hypertension 12/14/2019   Diverticulitis of large intestine with perforation and abscess 11/23/2019   Diverticulitis of colon with perforation 11/09/2019   Cholelithiasis 08/01/2012   Obesity 08/01/2012    -She underwent some many exams in the hospital by CT that I feel the drain is secure and functioning well, she is tolerating a reasonable appetite and time is on her side as she is not taking any antibiotics.  Will defer repeat CT evaluation for another month, and anticipate follow-up in the office at that time on a routine basis.  I am well aware that her clinical status could change and asked her to ensure that she keep 08/03/2012 in the loop if there are any changes in her drain function or the security of it.  I believe she and her mother both understand, will be following up with [place] as planned.   [place] M.D., FACS 01/07/2020, 11:44 AM

## 2020-01-07 NOTE — Patient Instructions (Addendum)
  CT scheduled [facility]/14/20 [facility] Please pick up your prep kit 2 days prior to having the CT scan. Nothing to eat/drink 4 hours prior.  Please  see your follow up appointment listed below.  [place] [address]     Diverticulitis  Diverticulitis is when small pockets in your large intestine (colon) get infected or swollen. This causes stomach pain and watery poop (diarrhea). These pouches are called diverticula. They form in people who have a condition called diverticulosis. Follow these instructions at home: Medicines  Take over-the-counter and prescription medicines only as told by your doctor. These include: ? Antibiotics. ? Pain medicines. ? Fiber pills. ? Probiotics. ? Stool softeners.  Do not drive or use heavy machinery while taking prescription pain medicine.  If you were prescribed an antibiotic, take it as told. Do not stop taking it even if you feel better. General instructions   Follow a diet as told by your doctor.  When you feel better, your doctor may tell you to change your diet. You may need to eat a lot of fiber. Fiber makes it easier to poop (have bowel movements). Healthy foods with fiber include: ? Berries. ? Beans. ? Lentils. ? Green vegetables.  Exercise 3 or more times a week. Aim for 30 minutes each time. Exercise enough to sweat and make your heart beat faster.  Keep all follow-up visits as told. This is important. You may need to have an exam of the large intestine. This is called a colonoscopy. Contact a doctor if:  Your pain does not get better.  You have a hard time eating or drinking.  You are not pooping like normal. Get help right away if:  Your pain gets worse.  Your problems do not get better.  Your problems get worse very fast.  You have a fever.  You throw up (vomit) more than one time.  You have poop that is: ? Bloody. ? Black. ? Tarry. Summary  Diverticulitis is when  small pockets in your large intestine (colon) get infected or swollen.  Take medicines only as told by your doctor.  Follow a diet as told by your doctor. This information is not intended to replace advice given to you by your health care provider. Make sure you discuss any questions you have with your health care provider. Document Revised: 02/08/2017 Document Reviewed: 03/15/2016 Elsevier Patient Education  [place].

## 2020-01-15 ENCOUNTER — Ambulatory Visit
Admission: RE | Admit: 2020-01-15 | Discharge: 2020-01-15 | Disposition: A | Discharge status: Home / Self Care | Payer: BC Managed Care – PPO | Source: Ambulatory Visit | Attending: Surgery | Admitting: Surgery

## 2020-01-15 ENCOUNTER — Other Ambulatory Visit: Payer: Self-pay

## 2020-01-15 DIAGNOSIS — K5720 Diverticulitis of large intestine with perforation and abscess without bleeding: Secondary | ICD-10-CM | POA: Insufficient documentation

## 2020-01-15 MED ORDER — IOHEXOL 350 MG/ML SOLN
125.0000 mL | Freq: Once | INTRAVENOUS | Status: AC | PRN
  Administered 2020-01-15: 125 mL via INTRAVENOUS

## 2020-01-18 ENCOUNTER — Other Ambulatory Visit: Payer: Self-pay | Admitting: Surgery

## 2020-01-18 ENCOUNTER — Telehealth: Payer: Self-pay | Admitting: *Deleted

## 2020-01-18 ENCOUNTER — Encounter: Payer: Self-pay | Admitting: Surgery

## 2020-01-18 MED ORDER — TRAMADOL HCL 50 MG PO TABS
100.0000 mg | ORAL_TABLET | Freq: Four times a day (QID) | ORAL | 0 refills | Status: DC | PRN
Start: 2020-01-18 — End: 2020-02-23

## 2020-01-18 MED ORDER — OXYCODONE HCL 10 MG PO TABS
10.0000 mg | ORAL_TABLET | Freq: Four times a day (QID) | ORAL | 0 refills | Status: DC | PRN
Start: 2020-01-18 — End: 2020-02-23

## 2020-01-18 NOTE — Telephone Encounter (Signed)
Patient called and wants to see if we can give her another refill on oxycodone and tramadol. Please call and advise

## 2020-01-18 NOTE — Telephone Encounter (Signed)
Spoke with patient regarding pain she is still experiencing from surgery (Cystoscopy) she had back in September with Dr.Rodenberg. Patient was requesting a refill on her prescription for Oxycodone and Tramadol. Patient was asked if she had been taking Ibuprofen or Tylenol, patient states she has Ibuprofen that was prescribed to her after being discharge from the hospital. Patient denies anyone informing her that she could take Tylenol (up to 650 mg) and Ibuprofen (up to 600 mg) every 3 hours alternating between the two. Per Dr.Rodenberg state he would refill prescriptions for patient this one last time, but suggest patient to try the Tylenol and Ibuprofen regimen to help with not needing the narcotic prescription as much. Patient verbalized understanding and has no further questions at this time.

## 2020-01-28 ENCOUNTER — Telehealth: Payer: Self-pay

## 2020-01-28 NOTE — Telephone Encounter (Signed)
Patient is still not able to return to work and is requesting a note stating when she can return to work patient states she may return to work full time12/13/21Please fax to [employer]. Her last PT home visit was 01/21/20 and she is still having to do exercises daily to regain her strength and better control when walking with a walker. Please fax note to [phone] Attention Heather. I let patient know we would call her back if unable to do at her home number.

## 2020-01-28 NOTE — Telephone Encounter (Signed)
Return to work note faxed to [employer] at this time with return to work full time 12/13/2. Patient notified and confirmation received.

## 2020-02-02 ENCOUNTER — Other Ambulatory Visit: Payer: Self-pay

## 2020-02-02 ENCOUNTER — Telehealth: Payer: Self-pay | Admitting: Surgery

## 2020-02-02 MED ORDER — IBUPROFEN 800 MG PO TABS: 800.0000 mg | ORAL_TABLET | Freq: Four times a day (QID) | ORAL | 0 refills | Status: DC | PRN

## 2020-02-02 NOTE — Telephone Encounter (Signed)
Patient notified that we have sent in a refill of her Ibuprofen.

## 2020-02-02 NOTE — Telephone Encounter (Signed)
Patient calling requesting a refill on her Ibuprofen.  Please call her.  Thank you.

## 2020-02-18 ENCOUNTER — Ambulatory Visit: Payer: Self-pay | Admitting: Surgery

## 2020-02-23 ENCOUNTER — Other Ambulatory Visit: Payer: Self-pay | Admitting: Surgery

## 2020-02-23 ENCOUNTER — Ambulatory Visit (INDEPENDENT_AMBULATORY_CARE_PROVIDER_SITE_OTHER): Payer: BC Managed Care – PPO | Admitting: Surgery

## 2020-02-23 ENCOUNTER — Encounter: Payer: Self-pay | Admitting: Surgery

## 2020-02-23 ENCOUNTER — Other Ambulatory Visit
Admission: RE | Admit: 2020-02-23 | Discharge: 2020-02-23 | Disposition: A | Discharge status: Home / Self Care | Payer: BC Managed Care – PPO | Source: Ambulatory Visit | Attending: Surgery | Admitting: Surgery

## 2020-02-23 ENCOUNTER — Other Ambulatory Visit: Payer: Self-pay

## 2020-02-23 VITALS — BP 171/110 | HR 114 | Temp 97.9°F | Ht 69.0 in | Wt 329.4 lb

## 2020-02-23 DIAGNOSIS — K5720 Diverticulitis of large intestine with perforation and abscess without bleeding: Secondary | ICD-10-CM

## 2020-02-23 DIAGNOSIS — R109 Unspecified abdominal pain: Secondary | ICD-10-CM

## 2020-02-23 LAB — CBC WITH DIFFERENTIAL/PLATELET
Abs Immature Granulocytes: 0.02 10*3/uL (ref 0.00–0.07)
Basophils Absolute: 0 10*3/uL (ref 0.0–0.1)
Basophils Relative: 0 %
Eosinophils Absolute: 0.1 10*3/uL (ref 0.0–0.5)
Eosinophils Relative: 1 %
HCT: 35.2 % — ABNORMAL LOW (ref 36.0–46.0)
Hemoglobin: 11.4 g/dL — ABNORMAL LOW (ref 12.0–15.0)
Immature Granulocytes: 0 %
Lymphocytes Relative: 29 %
Lymphs Abs: 2.3 10*3/uL (ref 0.7–4.0)
MCH: 25.5 pg — ABNORMAL LOW (ref 26.0–34.0)
MCHC: 32.4 g/dL (ref 30.0–36.0)
MCV: 78.7 fL — ABNORMAL LOW (ref 80.0–100.0)
Monocytes Absolute: 0.5 10*3/uL (ref 0.1–1.0)
Monocytes Relative: 6 %
Neutro Abs: 4.9 10*3/uL (ref 1.7–7.7)
Neutrophils Relative %: 64 %
Platelets: 411 10*3/uL — ABNORMAL HIGH (ref 150–400)
RBC: 4.47 MIL/uL (ref 3.87–5.11)
RDW: 19 % — ABNORMAL HIGH (ref 11.5–15.5)
WBC: 7.7 10*3/uL (ref 4.0–10.5)
nRBC: 0 % (ref 0.0–0.2)

## 2020-02-23 LAB — COMPREHENSIVE METABOLIC PANEL
ALT: 13 U/L (ref 0–44)
AST: 23 U/L (ref 15–41)
Albumin: 2.6 g/dL — ABNORMAL LOW (ref 3.5–5.0)
Alkaline Phosphatase: 77 U/L (ref 38–126)
Anion gap: 13 (ref 5–15)
BUN: <5 mg/dL — ABNORMAL LOW (ref 6–20)
CO2: 25 mmol/L (ref 22–32)
Calcium: 7.8 mg/dL — ABNORMAL LOW (ref 8.9–10.3)
Chloride: 101 mmol/L (ref 98–111)
Creatinine, Ser: 0.8 mg/dL (ref 0.44–1.00)
GFR, Estimated: >60 mL/min (ref >60)
Glucose, Bld: 138 mg/dL — ABNORMAL HIGH (ref 70–99)
Potassium: 2.7 mmol/L — CL (ref 3.5–5.1)
Sodium: 139 mmol/L (ref 135–145)
Total Bilirubin: 1.1 mg/dL (ref 0.3–1.2)
Total Protein: 7.1 g/dL (ref 6.5–8.1)

## 2020-02-23 NOTE — Patient Instructions (Addendum)
We will put in a referral to have your fistula checked. They will call you with an appointment. A CT has been ordered and lab work. Your CT scan is scheduled for Jan 4th, 2022 at 9 am, arrive at 8:30 am, nothing to eat or drink 4 hours prior to CT. Pick up contrast between now and Jan 3rd, 2022. If you have any concerns or questions, please feel free to call our office.

## 2020-02-23 NOTE — Progress Notes (Signed)
Patient ID: Amanda Reyes, female   DOB: Sep 02, 1985, 34 y.o.   MRN: [id number]  Chief Complaint: Follow-up visit  History of Present Illness Amanda Reyes is a 34 y.o. female with presentation for follow-up.  She still has a left lower quadrant drain which seems to be diminishing insignificance.  Minimal output reported, but varies at times.  There is such staining of the bulb and tubing itself its difficult to tell what type of drainage is coming.  It still appears to be consistent with a enteric or colonic fistula.  They are having no trouble keeping the bulb functioning, not seeing any immediate egress of air or gas.  Her mom is maintaining the drain site well. She complains of abdominal pain still but it is midline, upper abdomen and associated back pain.  She reports it is an 8 out of 10.  She had some fevers and chills about 3 weeks ago, not recently.  She does report gagging on liquids at times and vomiting.  She denies any bilious emesis.  No known fevers or chills.  She was inquiring about additional or increased pain medication. She has been having increasing frequency and her loose stools over the last week.  She denies any blood in the stools. She is also very concerned about demands from her workplace, apparently wanting her to work on site again.  She seems to be functioning to some degree at home.  She feels that navigating the worksite is too much for her physically at this time. 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 She also has concerns about hair loss and this seems to be occurring since Thanksgiving.  Past Medical History Past Medical History:  Diagnosis Date  . Hypertension       Past Surgical History:  Procedure Laterality Date  . CHOLECYSTECTOMY N/A 08/08/2012   Procedure: LAPAROSCOPIC CHOLECYSTECTOMY;  Surgeon: Dalia Heading, MD;  Location: [facility];  Service: General;  Laterality: N/A;  . CYSTOSCOPY  11/18/2019    Procedure: CYSTOSCOPY;  Surgeon: Campbell Lerner, MD;  Location: [facility];  Service: General;;  . ERCP N/A 08/02/2012   Procedure: ENDOSCOPIC RETROGRADE CHOLANGIOPANCREATOGRAPHY (ERCP);  Surgeon: Malissa Hippo, MD;  Location: [facility];  Service: Gastroenterology;  Laterality: N/A;  . IRRIGATION AND DEBRIDEMENT ABDOMEN  11/18/2019   Procedure: IRRIGATION AND DEBRIDEMENT ABDOMEN; INTRA-ABDOMINAL/PELVIC ABCESSES AND DRAIN PLACEMENT.;  Surgeon: Campbell Lerner, MD;  Location: [facility];  Service: General;;  . PANCREATIC STENT PLACEMENT N/A 08/02/2012   Procedure: PANCREATIC STENT PLACEMENT;  Surgeon: Malissa Hippo, MD;  Location: [facility];  Service: Gastroenterology;  Laterality: N/A;  . ROBOTIC ASSISTED LAPAROSCOPIC LYSIS OF ADHESION  11/18/2019   Procedure: XI ROBOTIC ASSISTED LAPAROSCOPIC LYSIS OF ADHESION;;  Surgeon: Campbell Lerner, MD;  Location: [facility];  Service: General;;  . SPHINCTEROTOMY N/A 08/02/2012   Procedure: SPHINCTEROTOMY;  Surgeon: Malissa Hippo, MD;  Location: [facility];  Service: Gastroenterology;  Laterality: N/A;  . STONE EXTRACTION WITH BASKET N/A 08/02/2012   Procedure: STONE EXTRACTION WITH BASKET;  Surgeon: Malissa Hippo, MD;  Location: [facility];  Service: Gastroenterology;  Laterality: N/A;    Allergies  Allergen Reactions  . Penicillins Hives    Did it involve swelling of the face/tongue/throat, SOB, or low BP? No Did it involve sudden or severe rash/hives, skin peeling, or any reaction on the inside of your mouth or nose? Yes Did you need to seek medical attention at  a hospital or doctor's office? No When did it last happen? If all above answers are "NO", may proceed with cephalosporin use..3  . Tomato Rash    Current Outpatient Medications  Medication Sig Dispense Refill  . ibuprofen (ADVIL) 800 MG tablet Take 1 tablet (800 mg total) by mouth every 6 (six) hours as needed for fever, headache, mild pain or moderate pain. 30 tablet 0  .  losartan-hydrochlorothiazide (HYZAAR) 100-25 MG tablet Take 1 tablet by mouth daily. 30 tablet 2  . methocarbamol (ROBAXIN) 500 MG tablet Take 1 tablet (500 mg total) by mouth every 6 (six) hours as needed for muscle spasms. 30 tablet 0  . ondansetron (ZOFRAN) 4 MG tablet Take 1 tablet (4 mg total) by mouth daily as needed. 14 tablet 0   No current facility-administered medications for this visit.    Family History History reviewed. No pertinent family history.    Social History Social History   Tobacco Use  . Smoking status: Never Smoker  . Smokeless tobacco: Never Used  Substance Use Topics  . Alcohol use: No  . Drug use: No        Review of Systems  Constitutional: Positive for weight loss.  HENT: Negative.   Eyes: Positive for blurred vision.  Respiratory: Positive for shortness of breath.   Cardiovascular: Negative.   Gastrointestinal: Positive for abdominal pain, diarrhea, nausea and vomiting.  Skin: Positive for rash.  Neurological: Positive for speech change.  Psychiatric/Behavioral: Positive for depression.      Physical Exam Blood pressure (!) 171/110, pulse (!) 114, temperature 97.9 F (36.6 C), temperature source Oral, height 5\' 9"  (1.753 m), weight (!) 329 lb 6.4 oz (149.4 kg), SpO2 96 %. Last Weight  Most recent update: 02/23/2020 11:12 AM   Weight  149.4 kg (329 lb 6.4 oz)              CONSTITUTIONAL: Well developed, morbidly obese, appropriately responsive and aware without distress.   EYES: Sclera non-icteric.   EARS, NOSE, MOUTH AND THROAT: Mask worn.    Hearing is intact to voice.  NECK: Trachea is midline, and there is no appreciated jugular venous distension.  LYMPH NODES:  Lymph nodes in the neck are not noted. RESPIRATORY:  Lungs are clear, and breath sounds are equal bilaterally. Normal respiratory effort without pathologic use of accessory muscles. CARDIOVASCULAR: Heart is regular in rate and rhythm. GI: The abdomen is soft, nontender,  and nondistended.  There is a left lower quadrant drain which is nicely dressed.  The tubing is stained dark brown to black.  The bulb is compressed without appreciable drainage present.  There were no palpable masses.  MUSCULOSKELETAL:  Symmetrical muscle tone appreciated.  SKIN: Skin turgor is normal. No pathologic skin lesions appreciated.  NEUROLOGIC:  Motor and sensation appear grossly normal.  Cranial nerves are grossly without defect. PSYCH:  Alert and oriented to person, place and time. Affect is appropriate for situation.  Data Reviewed I have personally reviewed what is currently available of the patient's imaging, recent labs and medical records.   Labs:  Nothing recent   Imaging:  Within last 24 hrs: No results found.  Assessment    Enteric/colonic fistula, well controlled with drain.  Seemingly improving with diminished output. Persisting abdominal complaints, needing further evaluation and follow-up.   Patient Active Problem List   Diagnosis Date Noted  . Pressure injury of skin 12/16/2019  . Acute respiratory failure with hypoxia and hypercapnia (HCC) 12/14/2019  . Essential hypertension  12/14/2019  . Diverticulitis of large intestine with perforation and abscess 11/23/2019  . Diverticulitis of colon with perforation 11/09/2019  . Cholelithiasis 08/01/2012  . Obesity 08/01/2012    Plan    We will obtain labs CBC, CMP.  Obtain CT abdominal pelvis.  Referral to fistula center in [facility] for catheter check, exchange of catheter as indicated.  Patient to submit stool sample for C. difficile check.  Referral to colorectal surgeon at [facility] for assumption of care in this challenging patient. Follow-up in 2 to 3 weeks or after the above are completed.     Campbell Lerner M.D., FACS 02/23/2020, 12:16 PM

## 2020-02-24 ENCOUNTER — Other Ambulatory Visit: Payer: Self-pay | Admitting: Surgery

## 2020-02-24 ENCOUNTER — Other Ambulatory Visit: Payer: Self-pay

## 2020-02-24 ENCOUNTER — Telehealth: Payer: Self-pay

## 2020-02-24 DIAGNOSIS — E876 Hypokalemia: Secondary | ICD-10-CM

## 2020-02-24 MED ORDER — POTASSIUM CHLORIDE ER 20 MEQ PO TBCR: 2.0000 | EXTENDED_RELEASE_TABLET | Freq: Two times a day (BID) | ORAL | 0 refills | Status: AC

## 2020-02-24 MED ORDER — TRAMADOL HCL 50 MG PO TABS: 50.0000 mg | ORAL_TABLET | Freq: Two times a day (BID) | ORAL | 0 refills | Status: AC | PRN

## 2020-02-24 MED ORDER — POTASSIUM CHLORIDE ER 10 MEQ PO TBCR: 10.0000 meq | EXTENDED_RELEASE_TABLET | Freq: Every day | ORAL | 0 refills | Status: DC

## 2020-02-24 NOTE — Progress Notes (Signed)
CMP yesterday with K+ at 2.7.   Pt also requested additional pain meds.   Rx for KCl 40 mEq PO bid for 7 days, and Tramadol #20, sent to [facility] [id number].  Ordered repeat BMP for one week.

## 2020-02-24 NOTE — Telephone Encounter (Signed)
Critical Potassium level 2.7-spoke with Dr.Rodenberg and he will send RX for potassium- I spoke with patient and she will pick up RX and begin taking today-also advised to eat potassium rich foods. Patient verbalized understanding.

## 2020-03-10 ENCOUNTER — Telehealth: Payer: Self-pay

## 2020-03-10 NOTE — Telephone Encounter (Signed)
Patient wanted to know if there was a smaller Potassium pill that could be called in and I suggested crushing the pill and mixing with applesauce so she could swallow it. She will try this and call back if unsuccessful.

## 2020-03-15 ENCOUNTER — Ambulatory Visit: Payer: BC Managed Care – PPO

## 2020-03-15 ENCOUNTER — Telehealth: Payer: Self-pay | Admitting: *Deleted

## 2020-03-15 NOTE — Telephone Encounter (Signed)
Faxed FMLA to [employer] at [phone]

## 2020-03-16 ENCOUNTER — Other Ambulatory Visit: Payer: Self-pay | Admitting: Surgery

## 2020-03-16 ENCOUNTER — Encounter: Payer: Self-pay | Admitting: Radiology

## 2020-03-16 ENCOUNTER — Ambulatory Visit
Admission: RE | Admit: 2020-03-16 | Discharge: 2020-03-16 | Disposition: A | Discharge status: Home / Self Care | Payer: BC Managed Care – PPO | Source: Ambulatory Visit | Attending: Surgery | Admitting: Surgery

## 2020-03-16 DIAGNOSIS — R109 Unspecified abdominal pain: Secondary | ICD-10-CM

## 2020-03-16 HISTORY — PX: IR RADIOLOGIST EVAL & MGMT: IMG5224

## 2020-03-16 NOTE — Progress Notes (Signed)
Referring Physician(s): Rodenberg,Denny  Chief Complaint: The patient is seen in follow up today s/p   History of present illness:  Onset Abd pain for weeks late August CT 11/12/19 revealing sigmoid diverticulitis Admitted [facility] and Gen Surgery recommended conservative management initially 9/3 IR drain placement Continued pain and fever and high white count Laparoscopy exploration 9/8 showing many loculated abcesses Admitted then to ICU and intubated New IR drain RUQ 11/27/19 Then developed LLQ colonic fistula with another IR drain placed 9/27. Drain injection of Rt lower abd drain 12/21/19: Partially retracted indwelling right lower quadrant surgical drain. Unable to assess for undrained fluid collection fluoroscopically. Has since been removed.  Discharged from [facility] 10/13  OP CT 01/15/20:  Improved but persistent acute mid sigmoid diverticulitis within enterocolonic fistula not excluded. A left lower quadrant surgical drain terminates adjacent to the sigmoid colon and a couple loops of small bowel with a possible pericentimeter residual organized fluid and gas collection that is difficult to measure. No new abdominal or pelvic abscess. No findings of bowel perforation. Recommend colonoscopy status post treatment and complete resolution of inflammatory changes to exclude underlying malignancy.  Was scheduled for CT as OP 1/4 at [facility]-- pt rescheduled to 1/13 Appointment today 03/16/20 at [facility] IR Clinic Injection LLQ drain today shows communication to bowel. OP is brown thick fluid Pt is flushing 2x/week. Denies fever; pain; chills   Past Medical History:  Diagnosis Date  . Hypertension     Past Surgical History:  Procedure Laterality Date  . CHOLECYSTECTOMY N/A 08/08/2012   Procedure: LAPAROSCOPIC CHOLECYSTECTOMY;  Surgeon: Jamesetta So, MD;  Location: [facility];  Service: General;  Laterality: N/A;  . CYSTOSCOPY  11/18/2019   Procedure: CYSTOSCOPY;  Surgeon: Ronny Bacon, MD;  Location: [facility];  Service: General;;  . ERCP N/A 08/02/2012   Procedure: ENDOSCOPIC RETROGRADE CHOLANGIOPANCREATOGRAPHY (ERCP);  Surgeon: Rogene Houston, MD;  Location: [facility];  Service: Gastroenterology;  Laterality: N/A;  . IRRIGATION AND DEBRIDEMENT ABDOMEN  11/18/2019   Procedure: IRRIGATION AND DEBRIDEMENT ABDOMEN; INTRA-ABDOMINAL/PELVIC ABCESSES AND DRAIN PLACEMENT.;  Surgeon: Ronny Bacon, MD;  Location: [facility];  Service: General;;  . PANCREATIC STENT PLACEMENT N/A 08/02/2012   Procedure: PANCREATIC STENT PLACEMENT;  Surgeon: Rogene Houston, MD;  Location: [facility];  Service: Gastroenterology;  Laterality: N/A;  . ROBOTIC ASSISTED LAPAROSCOPIC LYSIS OF ADHESION  11/18/2019   Procedure: XI ROBOTIC ASSISTED LAPAROSCOPIC LYSIS OF ADHESION;;  Surgeon: Ronny Bacon, MD;  Location: [facility];  Service: General;;  . SPHINCTEROTOMY N/A 08/02/2012   Procedure: SPHINCTEROTOMY;  Surgeon: Rogene Houston, MD;  Location: [facility];  Service: Gastroenterology;  Laterality: N/A;  . STONE EXTRACTION WITH BASKET N/A 08/02/2012   Procedure: STONE EXTRACTION WITH BASKET;  Surgeon: Rogene Houston, MD;  Location: [facility];  Service: Gastroenterology;  Laterality: N/A;    Allergies: Penicillins and Tomato  Medications: Prior to Admission medications   Medication Sig Start Date End Date Taking? Authorizing Provider  ibuprofen (ADVIL) 800 MG tablet Take 1 tablet (800 mg total) by mouth every 6 (six) hours as needed for fever, headache, mild pain or moderate pain. 02/02/20   Ronny Bacon, MD  losartan-hydrochlorothiazide (HYZAAR) 100-25 MG tablet Take 1 tablet by mouth daily. 03/26/19 03/25/20  Merlyn Lot, MD  methocarbamol (ROBAXIN) 500 MG tablet Take 1 tablet (500 mg total) by mouth every 6 (six) hours as needed for muscle spasms. 12/23/19   Tylene Fantasia, PA-C  ondansetron (ZOFRAN) 4 MG tablet Take 1 tablet (4  mg total) by mouth daily as needed. 03/26/19 03/25/20  Willy Eddy,  MD  Potassium Chloride ER 20 MEQ TBCR Take 2 capsules by mouth 2 times daily at 12 noon and 4 pm for 7 days. 02/24/20 03/02/20  Campbell Lerner, MD  traMADol (ULTRAM) 50 MG tablet Take 1 tablet (50 mg total) by mouth 2 (two) times daily as needed. 02/24/20 02/23/21  Campbell Lerner, MD     No family history on file.  Social History   Socioeconomic History  . Marital status: Single    Spouse name: Not on file  . Number of children: Not on file  . Years of education: Not on file  . Highest education level: Not on file  Occupational History  . Not on file  Tobacco Use  . Smoking status: Never Smoker  . Smokeless tobacco: Never Used  Substance and Sexual Activity  . Alcohol use: No  . Drug use: No  . Sexual activity: Never    Birth control/protection: Abstinence  Other Topics Concern  . Not on file  Social History Narrative  . Not on file   Social Determinants of Health   Financial Resource Strain: Not on file  Food Insecurity: Not on file  Transportation Needs: Not on file  Physical Activity: Not on file  Stress: Not on file  Social Connections: Not on file     Vital Signs: There were no vitals taken for this visit.  Physical Exam Skin:    General: Skin is warm.     Comments: Site is clean and dry NT no infection seen No bleeding OP brown thick fluid into JP bulb  Injection revealing communication to bowel per Dr Lowella Dandy     Imaging: No results found.  Labs:  CBC: Recent Labs    12/15/19 0407 12/16/19 0425 12/17/19 0626 02/23/20 1634  WBC 8.5 8.1 7.5 7.7  HGB 7.3* 8.4* 8.6* 11.4*  HCT 24.3* 26.0* 27.4* 35.2*  PLT 299 295 298 411*    COAGS: Recent Labs    11/09/19 1758  INR 1.1  APTT 30    BMP: Recent Labs    12/11/19 0613 12/12/19 0618 12/14/19 0530 12/15/19 0407 12/17/19 0626 02/23/20 1634  NA 133* 134* 135 133* 136 139  K 4.1 3.9 4.0 3.9 3.9 2.7*  CL 98 98 97* 96* 97* 101  CO2 27 28 32 29 28 25   GLUCOSE 203* 127* 131* 134*  104* 138*  BUN 22* 19 17 17 15  <5*  CALCIUM 8.1* 8.5* 8.0* 8.0* 8.2* 7.8*  CREATININE 0.65 0.54 0.44 0.50 0.52 0.80  GFRNONAA >60 >60 >60 >60 >60 >60  GFRAA >60 >60 >60 >60  --   --     LIVER FUNCTION TESTS: Recent Labs    12/07/19 0501 12/09/19 0537 12/10/19 0440 12/12/19 0618 12/14/19 0530 02/23/20 1634  BILITOT 0.5  --  0.4  --  0.5 1.1  AST 42*  --  44*  --  30 23  ALT 74*  --  103*  --  79* 13  ALKPHOS 190*  --  193*  --  144* 77  PROT 8.0  --  8.1  --  7.9 7.1  ALBUMIN 1.4*   < > 1.5* 1.5* 1.6* 2.6*   < > = values in this interval not displayed.    Assessment:  LLQ abscess drain; +communication to bowel per injection Will keep for now Change JP to gravity bag Continue flush 2x/week Pt is to see Dr 02/13/20 end of  month (she thinks) She is to keep appt for CT 03/24/20 To return to IR OP Clinic 2-3 weeks for re injection She has good understanding of plan Dr Lowella Dandy will discuss with Dr Claudine Mouton   Signed: Robet Leu, PA-C 03/16/2020, 1:35 PM   Please refer to Dr. Lowella Dandy attestation of this note for management and plan.

## 2020-03-17 ENCOUNTER — Telehealth: Payer: Self-pay | Admitting: Surgery

## 2020-03-17 NOTE — Telephone Encounter (Signed)
Patient had concerns of being seen at [facility] for drain function-while being seen in [place]- I explained that the Referral to [facility] was for the Colorectal surgeon. Patient verbalized understanding.

## 2020-03-17 NOTE — Telephone Encounter (Signed)
Patient calls.  She is confused about some imaging appointments that we have scheduled for her from here.  She further states also going to [facility] and feels like these may be duplicate appointments.  Please call her to help her figure this out.  Thank you.

## 2020-03-22 ENCOUNTER — Other Ambulatory Visit: Payer: Self-pay

## 2020-03-22 ENCOUNTER — Telehealth: Payer: Self-pay | Admitting: *Deleted

## 2020-03-22 MED ORDER — IBUPROFEN 800 MG PO TABS: 800.0000 mg | ORAL_TABLET | Freq: Four times a day (QID) | ORAL | 1 refills | Status: AC | PRN

## 2020-03-22 NOTE — Telephone Encounter (Signed)
Patient states that she has not been able to take any of her Potassium prescription with out throwing it up. She also did not get her repeat lab work as ordered. Patient notified that she needs to have her repeat lab work so the doctor knows where her Potassium levels are now prior to changing her treatment. Patient notified that Dr Claudine Mouton will not refill any narcotic pain medications. We will send in a refill of Ibuprofen. She is scheduled for her CT scan tomorrow and will have her lab work completed prior to this tomorrow.

## 2020-03-22 NOTE — Telephone Encounter (Signed)
Patient called and wanted to see if she can get another refill on Ibuprofen 800 mg and oxycodone. She is still having off and on stomach pain- feels like something is squeezing her. She has tried tylenol and ibuprofen which does not help. She has been experiencing vomiting after taking the potassium pills and wondering if that could be the cause of her stomach pain. Please call and advise

## 2020-03-24 ENCOUNTER — Telehealth: Payer: Self-pay

## 2020-03-24 ENCOUNTER — Other Ambulatory Visit: Payer: Self-pay

## 2020-03-24 ENCOUNTER — Ambulatory Visit
Admission: RE | Admit: 2020-03-24 | Discharge: 2020-03-24 | Disposition: A | Discharge status: Home / Self Care | Payer: BC Managed Care – PPO | Source: Ambulatory Visit | Attending: Surgery | Admitting: Surgery

## 2020-03-24 DIAGNOSIS — E876 Hypokalemia: Secondary | ICD-10-CM

## 2020-03-24 DIAGNOSIS — R109 Unspecified abdominal pain: Secondary | ICD-10-CM | POA: Insufficient documentation

## 2020-03-24 MED ORDER — IOHEXOL 300 MG/ML  SOLN
125.0000 mL | Freq: Once | INTRAMUSCULAR | Status: AC | PRN
  Administered 2020-03-24: 125 mL via INTRAVENOUS

## 2020-03-24 NOTE — Telephone Encounter (Signed)
Spoke with patient -she stated she cancelled the appointment with the Colorectal surgeon at [facility] and has not rescheduled. Patient also stated she did not stay to have labs drawn after having CT scan done today.   I received critical report from [facility] from Radiology and spoke with Dr.Rodenberg and patient was instructed to go straight to [facility] emergency room due to the results of her CT scan and the referral placed to Colorectal surgeon. Patient stated she would go to [facility] emergency room to be seen today. Dr.Rodenberg notified of patient stating she would go to [facility] emergency room today.

## 2020-03-24 NOTE — Telephone Encounter (Signed)
[facility]-Radiology- stat report- apparent anterior portion of pelvis. Measuring 3.8x 3.1 x2.9 cm. Associated with adjacent bowel. Spoke with Dr.Rodenberg-he will call the office

## 2020-04-06 ENCOUNTER — Other Ambulatory Visit: Payer: BC Managed Care – PPO

## 2020-09-07 ENCOUNTER — Other Ambulatory Visit: Payer: Self-pay | Admitting: Surgery

## 2020-09-07 DIAGNOSIS — K651 Peritoneal abscess: Secondary | ICD-10-CM

## 2020-10-03 ENCOUNTER — Ambulatory Visit
Admission: RE | Admit: 2020-10-03 | Discharge: 2020-10-03 | Disposition: A | Discharge status: Home / Self Care | Payer: BC Managed Care – PPO | Source: Ambulatory Visit | Attending: Surgery | Admitting: Surgery

## 2020-10-03 DIAGNOSIS — K651 Peritoneal abscess: Secondary | ICD-10-CM

## 2020-10-03 MED ORDER — IOPAMIDOL (ISOVUE-300) INJECTION 61%
100.0000 mL | Freq: Once | INTRAVENOUS | Status: AC | PRN
  Administered 2020-10-03: 100 mL via INTRAVENOUS

## 2020-12-02 ENCOUNTER — Encounter: Payer: Self-pay | Admitting: General Surgery

## 2021-06-09 IMAGING — CT CT IMAGE GUIDED DRAINAGE BY PERCUTANEOUS CATHETER
1 of 2 series · 15 of 32 positions shown, 19 images · non-contrast
Comparison: none

INDICATION: Right abdominal abscess

[Series 2: i-spiral 5.0 b30f · axial · 0.98mm/px · z∈[-244,-94]mm · 15 of 47 slices shown, 19 images]
[im 2/47  soft-tissue]
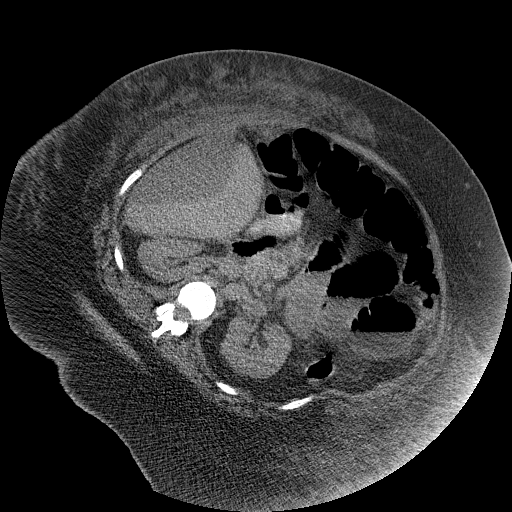
[im 2/47  bone]
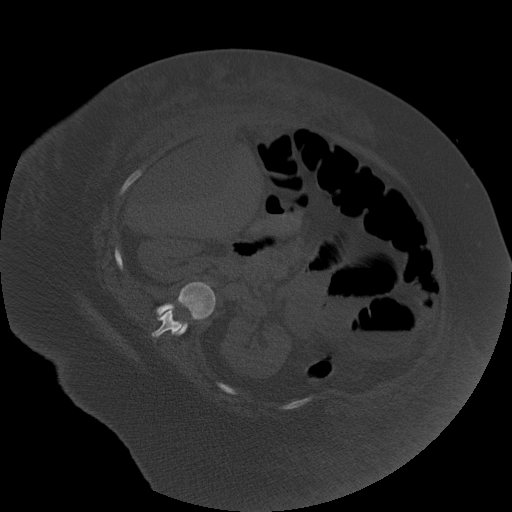
[im 6/47  soft-tissue]
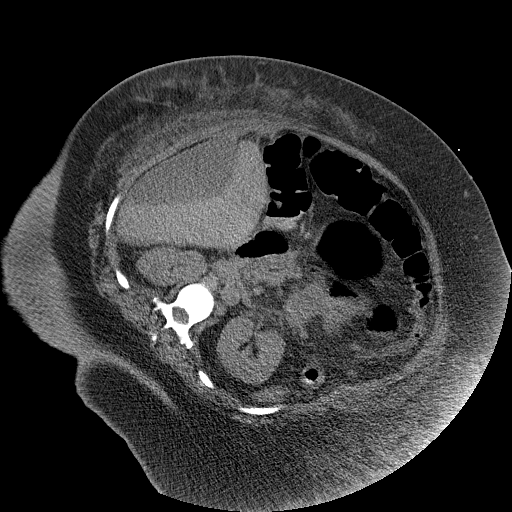
[im 10/47  soft-tissue]
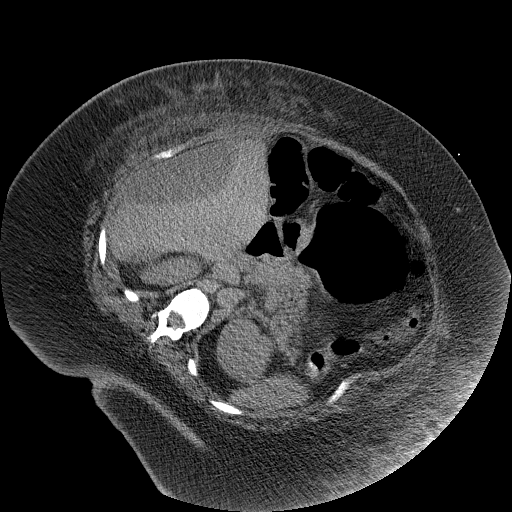
[im 14/47  soft-tissue]
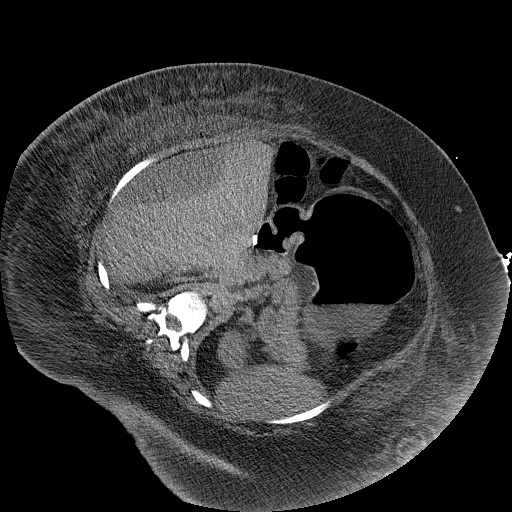
[im 16/47  soft-tissue]
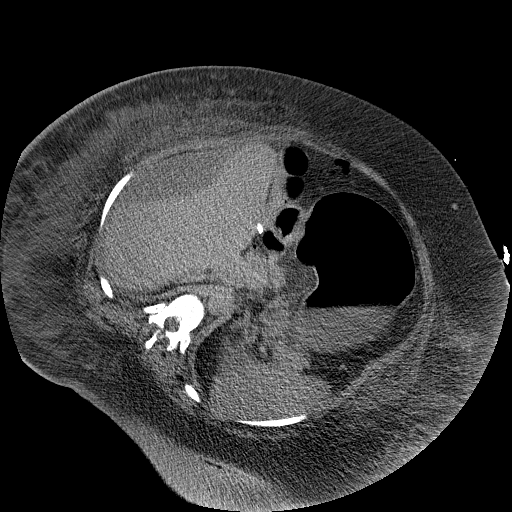
[im 20/47  soft-tissue]
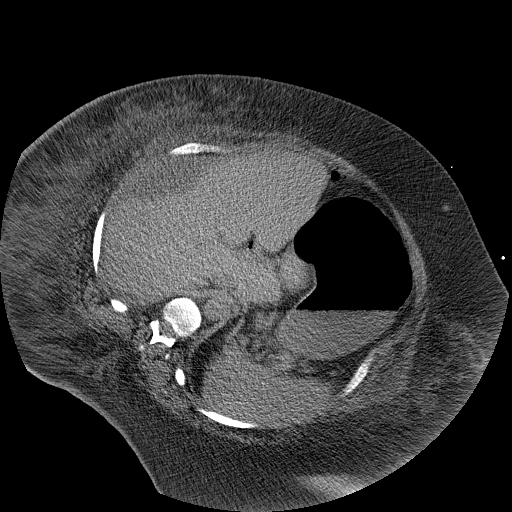
[im 24/47  soft-tissue]
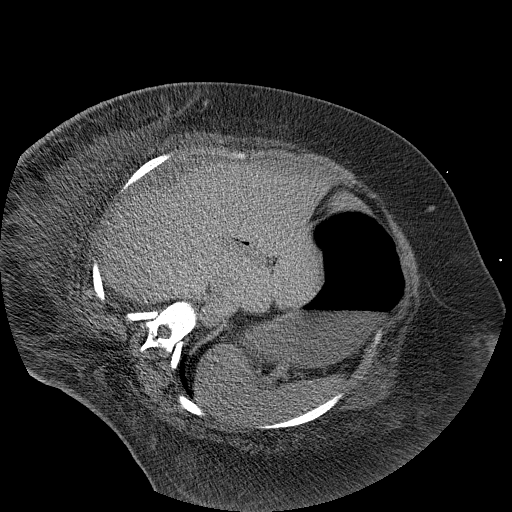
[im 27/47  soft-tissue]
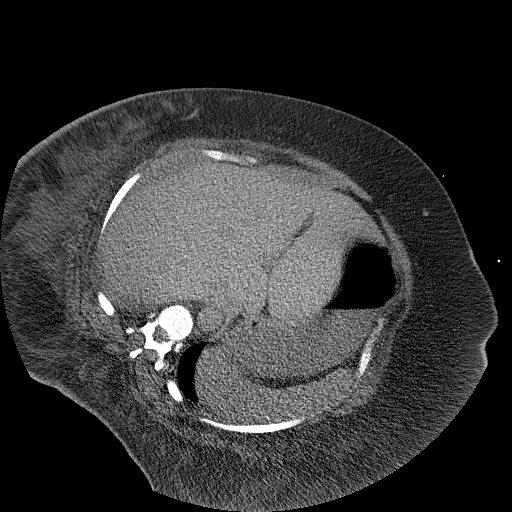
[im 31/47  soft-tissue]
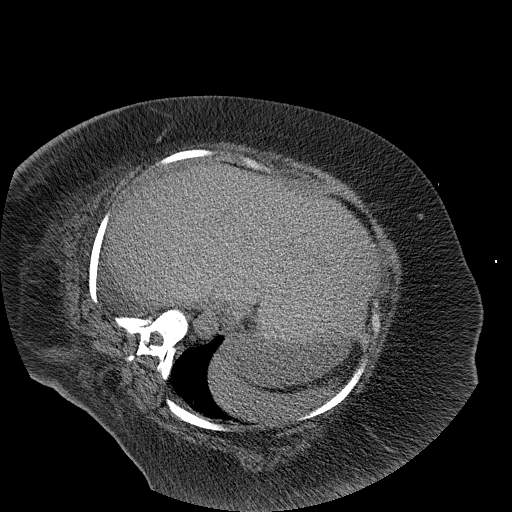
[im 31/47  bone]
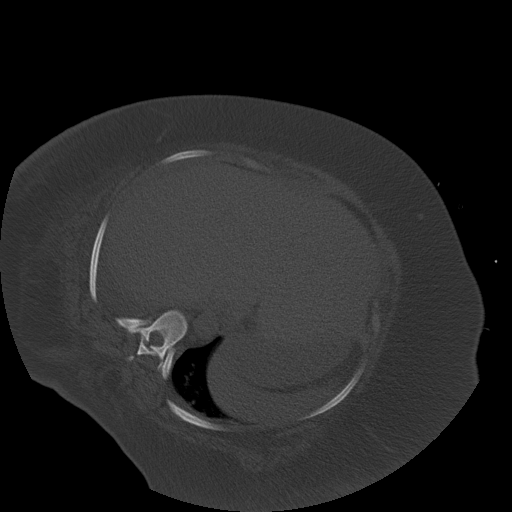
[im 33/47  soft-tissue]
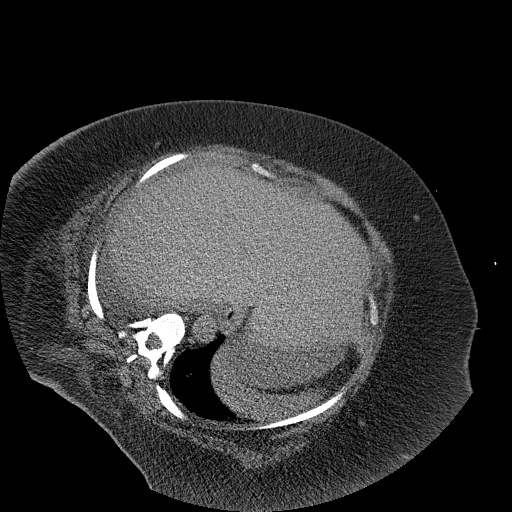
[im 37/47  soft-tissue]
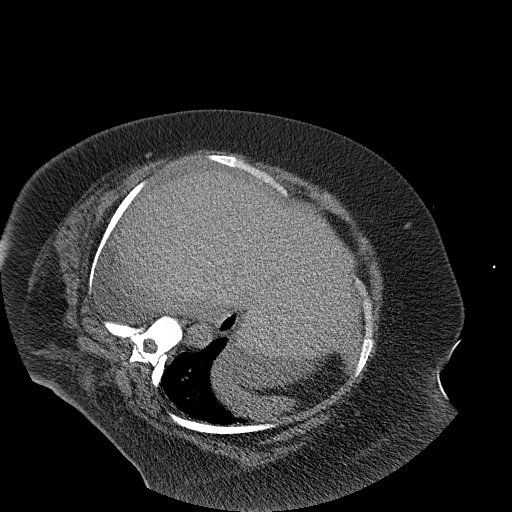
[im 39/47  lung]
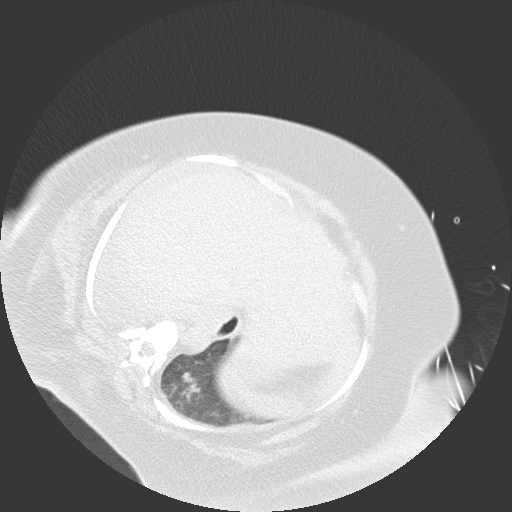
[im 41/47  soft-tissue]
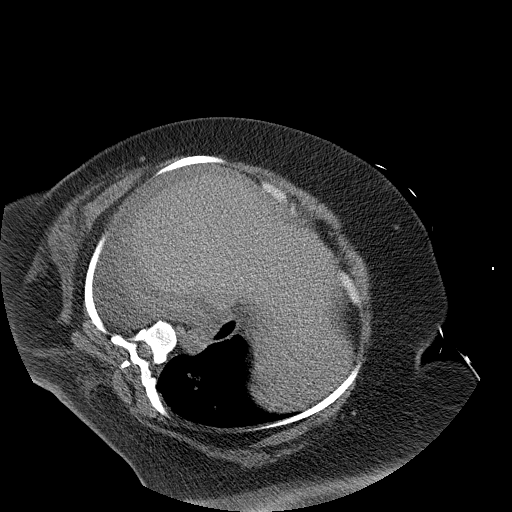
[im 41/47  lung]
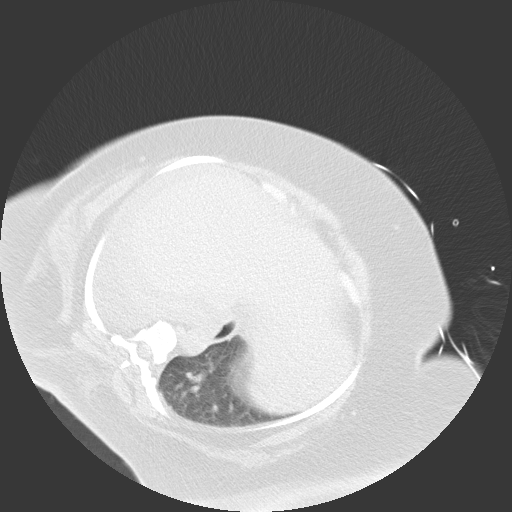
[im 43/47  lung]
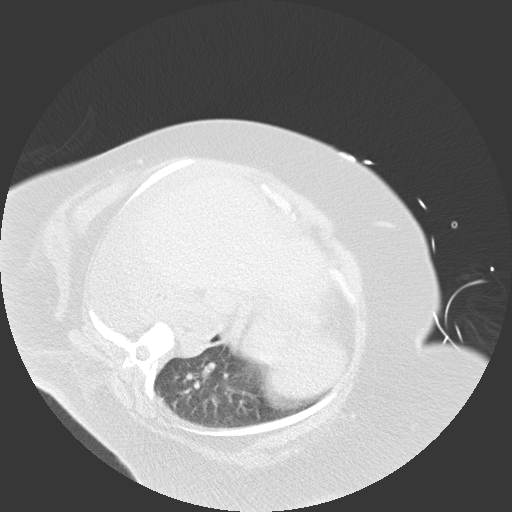
[im 45/47  soft-tissue]
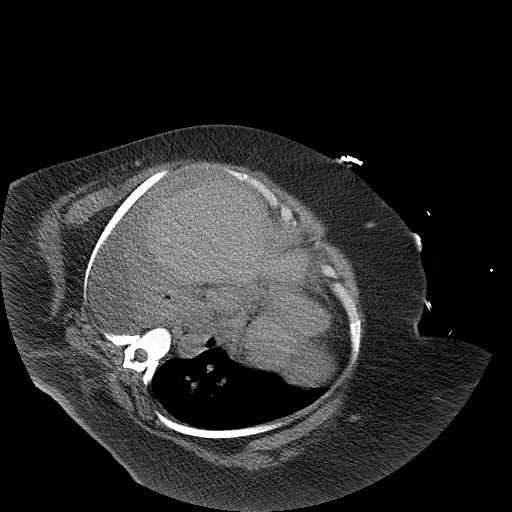
[im 45/47  lung]
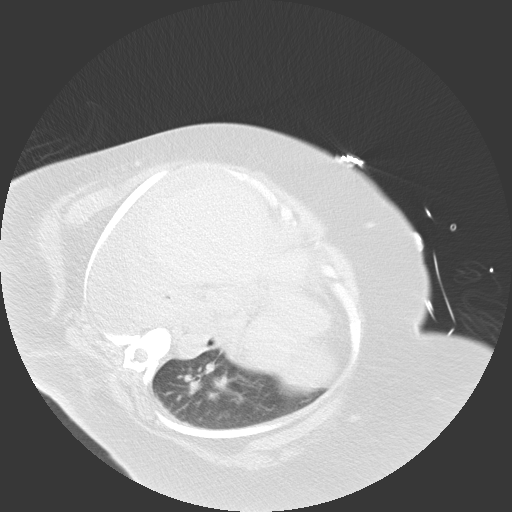

[15 of 32 positions shown; findings below may reference images not displayed]

EXAM:
CT-guided 12 French drainage right abdominal abscess

MEDICATIONS:
The patient is currently admitted to the hospital and receiving
intravenous antibiotics. The antibiotics were administered within an
appropriate time frame prior to the initiation of the procedure.

ANESTHESIA/SEDATION:
Fentanyl 1.0 mcg IV; Versed 50 mg IV

Moderate Sedation Time:  12 minutes

The patient was continuously monitored during the procedure by the
interventional radiology nurse under my direct supervision.

COMPLICATIONS:
None immediate.

PROCEDURE:
Informed written consent was obtained from the patient after a
thorough discussion of the procedural risks, benefits and
alternatives. All questions were addressed. Maximal Sterile Barrier
Technique was utilized including caps, mask, sterile gowns, sterile
gloves, sterile drape, hand hygiene and skin antiseptic. A timeout
was performed prior to the initiation of the procedure.

Previous imaging reviewed. Patient positioned right anterior
oblique. Noncontrast localization CT performed. The right abdominal
abscess was localized and marked.

Under sterile conditions and local anesthesia, an 18 gauge 15 cm
access needle was advanced into the abscess. Needle position
confirmed with CT. Syringe aspiration yielded purulent fluid. Sample
sent for culture. Guidewire inserted followed by tract dilatation to
insert a 12 French drain. Drain catheter position confirmed CT. 80
cc purulent fluid aspirated. Catheter secured with Prolene suture
and connected to external suction bulb. Sterile dressing applied. No
immediate complication. Patient tolerated the procedure well.
IMPRESSION: Successful CT-guided 12 French right abdominal abscess drain
placement

## 2022-07-09 ENCOUNTER — Emergency Department (HOSPITAL_BASED_OUTPATIENT_CLINIC_OR_DEPARTMENT_OTHER): Payer: BC Managed Care – PPO

## 2022-07-09 ENCOUNTER — Emergency Department (HOSPITAL_BASED_OUTPATIENT_CLINIC_OR_DEPARTMENT_OTHER)
Admission: EM | Admit: 2022-07-09 | Discharge: 2022-07-09 | Disposition: A | Discharge status: Home / Self Care | Payer: BC Managed Care – PPO | Attending: Emergency Medicine | Admitting: Emergency Medicine

## 2022-07-09 ENCOUNTER — Other Ambulatory Visit: Payer: Self-pay

## 2022-07-09 ENCOUNTER — Encounter (HOSPITAL_BASED_OUTPATIENT_CLINIC_OR_DEPARTMENT_OTHER): Payer: Self-pay

## 2022-07-09 DIAGNOSIS — R0789 Other chest pain: Secondary | ICD-10-CM | POA: Insufficient documentation

## 2022-07-09 DIAGNOSIS — R0602 Shortness of breath: Secondary | ICD-10-CM

## 2022-07-09 LAB — CBC WITH DIFFERENTIAL/PLATELET
Abs Immature Granulocytes: 0.03 10*3/uL (ref 0.00–0.07)
Basophils Absolute: 0 10*3/uL (ref 0.0–0.1)
Basophils Relative: 0 %
Eosinophils Absolute: 0.1 10*3/uL (ref 0.0–0.5)
Eosinophils Relative: 1 %
HCT: 37.4 % (ref 36.0–46.0)
Hemoglobin: 12.1 g/dL (ref 12.0–15.0)
Immature Granulocytes: 0 %
Lymphocytes Relative: 31 %
Lymphs Abs: 2.3 10*3/uL (ref 0.7–4.0)
MCH: 25.6 pg — ABNORMAL LOW (ref 26.0–34.0)
MCHC: 32.4 g/dL (ref 30.0–36.0)
MCV: 79.2 fL — ABNORMAL LOW (ref 80.0–100.0)
Monocytes Absolute: 0.4 10*3/uL (ref 0.1–1.0)
Monocytes Relative: 6 %
Neutro Abs: 4.6 10*3/uL (ref 1.7–7.7)
Neutrophils Relative %: 62 %
Platelets: 309 10*3/uL (ref 150–400)
RBC: 4.72 MIL/uL (ref 3.87–5.11)
RDW: 15.4 % (ref 11.5–15.5)
WBC: 7.5 10*3/uL (ref 4.0–10.5)
nRBC: 0 % (ref 0.0–0.2)

## 2022-07-09 LAB — BASIC METABOLIC PANEL
Anion gap: 10 (ref 5–15)
BUN: 16 mg/dL (ref 6–20)
CO2: 27 mmol/L (ref 22–32)
Calcium: 9.2 mg/dL (ref 8.9–10.3)
Chloride: 100 mmol/L (ref 98–111)
Creatinine, Ser: 0.86 mg/dL (ref 0.44–1.00)
GFR, Estimated: >60 mL/min (ref >60)
Glucose, Bld: 183 mg/dL — ABNORMAL HIGH (ref 70–99)
Potassium: 3.8 mmol/L (ref 3.5–5.1)
Sodium: 137 mmol/L (ref 135–145)

## 2022-07-09 LAB — BRAIN NATRIURETIC PEPTIDE: B Natriuretic Peptide: 30.5 pg/mL (ref 0.0–100.0)

## 2022-07-09 LAB — TROPONIN I (HIGH SENSITIVITY): Troponin I (High Sensitivity): 4 ng/L (ref <18)

## 2022-07-09 LAB — D-DIMER, QUANTITATIVE: D-Dimer, Quant: 0.53 ug/mL-FEU — ABNORMAL HIGH (ref 0.00–0.50)

## 2022-07-09 MED ORDER — AEROCHAMBER PLUS FLO-VU MISC
1.0000 | Freq: Once | Status: AC
  Administered 2022-07-09: 1
  Filled 2022-07-09: qty 1

## 2022-07-09 MED ORDER — DEXAMETHASONE 4 MG PO TABS
10.0000 mg | ORAL_TABLET | Freq: Once | ORAL | Status: AC
  Administered 2022-07-09: 10 mg via ORAL
  Filled 2022-07-09: qty 3

## 2022-07-09 MED ORDER — IOHEXOL 350 MG/ML SOLN
100.0000 mL | Freq: Once | INTRAVENOUS | Status: AC | PRN
  Administered 2022-07-09: 100 mL via INTRAVENOUS

## 2022-07-09 MED ORDER — IPRATROPIUM-ALBUTEROL 0.5-2.5 (3) MG/3ML IN SOLN
3.0000 mL | Freq: Once | RESPIRATORY_TRACT | Status: AC
  Administered 2022-07-09: 3 mL via RESPIRATORY_TRACT
  Filled 2022-07-09: qty 3

## 2022-07-09 MED ORDER — ALBUTEROL SULFATE HFA 108 (90 BASE) MCG/ACT IN AERS
2.0000 | INHALATION_SPRAY | Freq: Once | RESPIRATORY_TRACT | Status: AC
  Administered 2022-07-09: 2 via RESPIRATORY_TRACT
  Filled 2022-07-09: qty 6.7

## 2022-07-09 NOTE — ED Provider Notes (Signed)
[facility]ERGENC[facility] DEPARTMENT AT [facility] Provider Note   CSN: [id number] Arrival date & time: 07/09/22  1850     History  Chief Complaint  Patient presents with   Shortness of Breath    Amanda Reyes is a 37 y.o. female.  37 yo F with a chief complaint of difficulty breathing.  She tells me this been going on since she had COVID in January.  Felt it was worse this morning when she woke up.  Has had trouble catching her breath especially when she is talking or up moving around.  Has some chest discomfort with it off and on as well.  Denies history of PE or DVT.  Denies recent surgery denies mobilization denies hemoptysis.   Shortness of Breath      Home Medications Prior to Admission medications   Medication Sig Start Date End Date Taking? Authorizing Provider  ibuprofen (ADVIL) 800 MG tablet Take 1 tablet (800 mg total) by mouth every 6 (six) hours as needed for fever, headache, mild pain or moderate pain. 03/22/20   Campbell Lerner, MD  losartan-hydrochlorothiazide (HYZAAR) 100-25 MG tablet Take 1 tablet by mouth daily. 03/26/19 03/25/20  Willy Eddy, MD  methocarbamol (ROBAXIN) 500 MG tablet Take 1 tablet (500 mg total) by mouth every 6 (six) hours as needed for muscle spasms. 12/23/19   Donovan Kail, PA-C  Potassium Chloride ER 20 MEQ TBCR Take 2 capsules by mouth 2 times daily at 12 noon and 4 pm for 7 days. 02/24/20 03/02/20  Campbell Lerner, MD      Allergies    Penicillins and Tomato    Review of Systems   Review of Systems  Respiratory:  Positive for shortness of breath.     Physical Exam Updated Vital Signs BP (!) 163/94 (BP Location: Right Wrist)   Pulse (!) 101   Temp 98.8 F (37.1 C) (Oral)   Resp 20   Ht 5\' 9"  (1.753 m)   Wt (!) 149.4 kg   SpO2 96%   BMI 48.64 kg/m  Physical Exam Vitals and nursing note reviewed.  Constitutional:      General: She is not in acute distress.    Appearance: She is well-developed. She is  not diaphoretic.     Comments: BMI 48  HENT:     Head: Normocephalic and atraumatic.  Eyes:     Pupils: Pupils are equal, round, and reactive to light.  Cardiovascular:     Rate and Rhythm: Normal rate and regular rhythm.     Heart sounds: No murmur heard.    No friction rub. No gallop.  Pulmonary:     Effort: Pulmonary effort is normal.     Breath sounds: No wheezing or rales.     Comments: Exam limited due to body habitus.  No obvious wheezes.  No tachypnea. Abdominal:     General: There is no distension.     Palpations: Abdomen is soft.     Tenderness: There is no abdominal tenderness.  Musculoskeletal:        General: No tenderness.     Cervical back: Normal range of motion and neck supple.  Skin:    General: Skin is warm and dry.  Neurological:     Mental Status: She is alert and oriented to person, place, and time.  Psychiatric:        Behavior: Behavior normal.     ED Results / Procedures / Treatments   Labs (all labs ordered are listed,  but only abnormal results are displayed) Labs Reviewed  CBC WITH DIFFERENTIAL/PLATELET - Abnormal; Notable for the following components:      Result Value   MCV 79.2 (*)    MCH 25.6 (*)    All other components within normal limits  BASIC METABOLIC PANEL - Abnormal; Notable for the following components:   Glucose, Bld 183 (*)    All other components within normal limits  D-DIMER, QUANTITATIVE - Abnormal; Notable for the following components:   D-Dimer, Quant 0.53 (*)    All other components within normal limits  BRAIN NATRIURETIC PEPTIDE  TROPONIN I (HIGH SENSITIVITY)    EKG EKG Interpretation  Date/Time:  Monday July 09 2022 19:10:39 EDT Ventricular Rate:  109 PR Interval:  146 QRS Duration: 95 QT Interval:  337 QTC Calculation: 454 R Axis:   59 Text Interpretation: Sinus tachycardia Consider right atrial enlargement Low voltage, precordial leads Borderline T abnormalities, diffuse leads No significant change since  last tracing Confirmed by Melene Plan [phone]) on 07/09/2022 7:23:58 PM  Radiology CT Angio Chest PE W and/or Wo Contrast  Result Date: 07/09/2022 CLINICAL DATA:  Pulmonary embolism (PE) suspected, low to intermediate prob, positive D-dimer EXAM: CT ANGIOGRAPHY CHEST WITH CONTRAST TECHNIQUE: Multidetector CT imaging of the chest was performed using the standard protocol during bolus administration of intravenous contrast. Multiplanar CT image reconstructions and MIPs were obtained to evaluate the vascular anatomy. RADIATION DOSE REDUCTION: This exam was performed according to the departmental dose-optimization program which includes automated exposure control, adjustment of the mA and/or kV according to patient size and/or use of iterative reconstruction technique. CONTRAST:  OMNIPAQUE IOHEXOL 350 MG/ML SOLN COMPARISON:  12/07/2019 FINDINGS: Cardiovascular: Evaluation is limited by artifact related to body habitus which degrades the images satisfactory opacification of the pulmonary arteries to the segmental level. No evidence of pulmonary embolism. Normal heart size. No pericardial effusion. Mediastinum/Nodes: No enlarged mediastinal, hilar, or axillary lymph nodes. Thyroid gland, trachea, and esophagus demonstrate no significant findings. Lungs/Pleura: Lungs are clear. No pleural effusion or pneumothorax. Upper Abdomen: No acute abnormality. Musculoskeletal: No chest wall abnormality. No acute or significant osseous findings. Review of the MIP images confirms the above findings. IMPRESSION: No evidence of PE, aneurysm or dissection. Evaluation was limited by artifact related to body habitus. Electronically Signed   By: Layla Maw M.D.   On: 07/09/2022 21:06   DG Chest Port 1 View  Result Date: 07/09/2022 CLINICAL DATA:  sob EXAM: PORTABLE CHEST - 1 VIEW COMPARISON:  12/06/2019 FINDINGS: Cardiac silhouette is prominent. There is pulmonary interstitial prominence with vascular congestion. No focal  consolidation. No pneumothorax or pleural effusion identified. IMPRESSION: Findings suggest CHF. Electronically Signed   By: Layla Maw M.D.   On: 07/09/2022 19:45    Procedures Procedures    Medications Ordered in ED Medications  ipratropium-albuterol (DUONEB) 0.5-2.5 (3) MG/3ML nebulizer solution 3 mL (3 mLs Nebulization Given 07/09/22 1918)  iohexol (OMNIPAQUE) 350 MG/ML injection 100 mL (100 mLs Intravenous Contrast Given 07/09/22 2055)    ED Course/ Medical Decision Making/ A&P                             Medical Decision Making Amount and/or Complexity of Data Reviewed Labs: ordered. Radiology: ordered.  Risk Prescription drug management.   37 yo F with a chief complaint of difficulty breathing.  This been going on for months.  She felt worsened this morning.  Exam benign.  Tachycardic  otherwise PERC negative.  Will obtain labs, ddimer, cxr.   Chest x-ray independently interpreted by me without focal infiltrate.  Radiology read as possible CHF.  BNP negative, no anemia, no electrolyte abnormality. Trop negative.  Ddimer +, CTA chest negative for PE.    9:16 PM:  I have discussed the diagnosis/risks/treatment options with the patient.  Evaluation and diagnostic testing in the emergency department does not suggest an emergent condition requiring admission or immediate intervention beyond what has been performed at this time.  They will follow up with PCP. We also discussed returning to the ED immediately if new or worsening sx occur. We discussed the sx which are most concerning (e.g., sudden worsening pain, fever, inability to tolerate by mouth) that necessitate immediate return. Medications administered to the patient during their visit and any new prescriptions provided to the patient are listed below.  Medications given during this visit Medications  ipratropium-albuterol (DUONEB) 0.5-2.5 (3) MG/3ML nebulizer solution 3 mL (3 mLs Nebulization Given 07/09/22 1918)   iohexol (OMNIPAQUE) 350 MG/ML injection 100 mL (100 mLs Intravenous Contrast Given 07/09/22 2055)     The patient appears reasonably screen and/or stabilized for discharge and I doubt any other medical condition or other [facility] requiring further screening, evaluation, or treatment in the ED at this time prior to discharge.          Final Clinical Impression(s) / ED Diagnoses Final diagnoses:  SOB (shortness of breath)    Rx / DC Orders ED Discharge Orders     None         Melene Plan, DO 07/09/22 2116

## 2022-07-09 NOTE — ED Triage Notes (Signed)
Patient here POV from Home.  Endorses SOB that was noted early this AM that awoke the patient. Worsened with Exertion throughout the day.   Some Chest Tightness. No Fever. Cough is mostly non-productive.   NAD Noted during Triage. A&Ox4. GCS 15. Ambulatory.

## 2022-07-09 NOTE — Discharge Instructions (Signed)
Follow up with your doctor in the office.  

## 2022-07-09 NOTE — ED Notes (Signed)
Inhaler and spacer given to patient. Education given. Patient able to demonstrate skill.

## 2022-07-30 ENCOUNTER — Ambulatory Visit (INDEPENDENT_AMBULATORY_CARE_PROVIDER_SITE_OTHER): Payer: BC Managed Care – PPO | Admitting: Pulmonary Disease

## 2022-07-30 ENCOUNTER — Encounter: Payer: Self-pay | Admitting: Pulmonary Disease

## 2022-07-30 VITALS — BP 136/84 | HR 111 | Ht 69.0 in | Wt >= 6400 oz

## 2022-07-30 DIAGNOSIS — R0602 Shortness of breath: Secondary | ICD-10-CM

## 2022-07-30 MED ORDER — FLUTICASONE FUROATE-VILANTEROL 100-25 MCG/ACT IN AEPB: 1.0000 | INHALATION_SPRAY | Freq: Every day | RESPIRATORY_TRACT | 2 refills | Status: AC

## 2022-07-30 NOTE — Progress Notes (Signed)
Amanda Reyes    [id number]    June 05, 1985  Primary Care Physician:Patient, No Pcp Per  Referring Physician: Melene Plan, DO [address] [place],  [place] [address]  Chief complaint:   Shortness of breath, difficulty breathing since having COVID in January  HPI:  Recent ED visit with a CT and chest x-ray showing no acute infiltrate, no evidence of PE  Problems with her breathing Mostly at night  Occasional wheezing  No significant cough  No significant history of asthma or allergies  Social smoking, social vape use  No occupational predisposition  Outpatient Encounter Medications as of 07/30/2022  Medication Sig   gabapentin (NEURONTIN) 300 MG capsule Take by mouth.   ibuprofen (ADVIL) 800 MG tablet Take 1 tablet (800 mg total) by mouth every 6 (six) hours as needed for fever, headache, mild pain or moderate pain.   methocarbamol (ROBAXIN) 500 MG tablet Take 1 tablet (500 mg total) by mouth every 6 (six) hours as needed for muscle spasms.   naproxen (NAPROSYN) 500 MG tablet TAKE 1 TABLET (500MG  TOTAL) BY MOUTH IN THE MORNING AND TAKE 1 TABLET IN THE EVENING WITH MEALS   losartan-hydrochlorothiazide (HYZAAR) 100-25 MG tablet Take 1 tablet by mouth daily.   Potassium Chloride ER 20 MEQ TBCR Take 2 capsules by mouth 2 times daily at 12 noon and 4 pm for 7 days.   No facility-administered encounter medications on file as of 07/30/2022.    Allergies as of 07/30/2022 - Review Complete 07/30/2022  Allergen Reaction Noted   Penicillins Hives 08/14/2011   Tomato Rash 10/28/2017    Past Medical History:  Diagnosis Date   Hypertension     Past Surgical History:  Procedure Laterality Date   CHOLECYSTECTOMY N/A 08/08/2012   Procedure: LAPAROSCOPIC CHOLECYSTECTOMY;  Surgeon: Dalia Heading, MD;  Location: [facility];  Service: General;  Laterality: N/A;   CYSTOSCOPY  11/18/2019   Procedure: Bluford Kaufmann;  Surgeon: Campbell Lerner, MD;  Location: [facility];  Service:  General;;   ERCP N/A 08/02/2012   Procedure: ENDOSCOPIC RETROGRADE CHOLANGIOPANCREATOGRAPHY (ERCP);  Surgeon: Malissa Hippo, MD;  Location: [facility];  Service: Gastroenterology;  Laterality: N/A;   IR RADIOLOGIST EVAL & MGMT  03/16/2020   IRRIGATION AND DEBRIDEMENT ABDOMEN  11/18/2019   Procedure: IRRIGATION AND DEBRIDEMENT ABDOMEN; INTRA-ABDOMINAL/PELVIC ABCESSES AND DRAIN PLACEMENT.;  Surgeon: Campbell Lerner, MD;  Location: [facility];  Service: General;;   PANCREATIC STENT PLACEMENT N/A 08/02/2012   Procedure: PANCREATIC STENT PLACEMENT;  Surgeon: Malissa Hippo, MD;  Location: [facility];  Service: Gastroenterology;  Laterality: N/A;   ROBOTIC ASSISTED LAPAROSCOPIC LYSIS OF ADHESION  11/18/2019   Procedure: XI ROBOTIC ASSISTED LAPAROSCOPIC LYSIS OF ADHESION;;  Surgeon: Campbell Lerner, MD;  Location: [facility];  Service: General;;   SPHINCTEROTOMY N/A 08/02/2012   Procedure: Dennison Mascot;  Surgeon: Malissa Hippo, MD;  Location: [facility];  Service: Gastroenterology;  Laterality: N/A;   STONE EXTRACTION WITH BASKET N/A 08/02/2012   Procedure: STONE EXTRACTION WITH BASKET;  Surgeon: Malissa Hippo, MD;  Location: [facility];  Service: Gastroenterology;  Laterality: N/A;    History reviewed. No pertinent family history.  Social History   Socioeconomic History   Marital status: Single    Spouse name: Not on file   Number of children: Not on file   Years of education: Not on file   Highest education level: Not on file  Occupational History   Not on file  Tobacco  Use   Smoking status: Never   Smokeless tobacco: Never  Substance and Sexual Activity   Alcohol use: No   Drug use: No   Sexual activity: Never    Birth control/protection: Abstinence  Other Topics Concern   Not on file  Social History Narrative   Not on file   Social Determinants of Health   Financial Resource Strain: Not on file  Food Insecurity: Not on file  Transportation Needs: Not on file  Physical Activity: Not on file   Stress: Not on file  Social Connections: Not on file  Intimate Partner Violence: Not on file    Review of Systems  Constitutional:  Negative for fatigue.  Respiratory:  Positive for shortness of breath and stridor.     Vitals:   07/30/22 1512  BP: 136/84  Pulse: (!) 111  SpO2: 97%     Physical Exam Constitutional:      Appearance: She is obese.  HENT:     Head: Normocephalic.     Nose: Nose normal.     Mouth/Throat:     Mouth: Mucous membranes are moist.  Eyes:     General: No scleral icterus.    Pupils: Pupils are equal, round, and reactive to light.  Cardiovascular:     Rate and Rhythm: Normal rate and regular rhythm.     Heart sounds: No murmur heard.    No friction rub.  Pulmonary:     Effort: No respiratory distress.     Breath sounds: No stridor. No wheezing or rhonchi.  Musculoskeletal:     Cervical back: No rigidity or tenderness.  Neurological:     Mental Status: She is alert.  Psychiatric:        Mood and Affect: Mood normal.    Data Reviewed: CT scan of the chest 07/09/2022 reviewed by myself showing no acute infiltrate, no evidence of PE  Chest x-ray 07/09/2022-within normal limits  Blood work was within normal limits, no elevation in the white blood cell count  Assessment:  Airway hyperresponsiveness/airway hyperactivity following recent COVID infection -She was not hospitalized, did not need oxygen supplementation  Recent ED visit for shortness of breath  Recurrent wheezes and shortness of breath with activity  Class III obesity  Plan/Recommendations: Prescription for Breo sent to pharmacy  Continue using albuterol as needed  Tentative follow-up in about 8 weeks  Encouraged to participate in graded activities as tolerated  Call with significant concerns   Virl Diamond MD [facility] 07/30/2022, 3:20 PM  CC: Melene Plan, DO

## 2022-07-30 NOTE — Patient Instructions (Signed)
Prescription for Breo sent to pharmacy for you it is 1 inhalation a day -Ensure you rinse your mouth afterwards  Continue using albuterol as needed, you can use albuterol up to 4 times a day  Graded exercise as tolerated  Call us with significant concerns  Likely means that having COVID made you here with more hyperactive, expectation is that this will settle with time

## 2022-11-21 ENCOUNTER — Encounter (HOSPITAL_BASED_OUTPATIENT_CLINIC_OR_DEPARTMENT_OTHER): Payer: Self-pay

## 2022-11-21 DIAGNOSIS — R0683 Snoring: Secondary | ICD-10-CM

## 2022-11-21 DIAGNOSIS — G4719 Other hypersomnia: Secondary | ICD-10-CM

## 2022-11-21 DIAGNOSIS — I10 Essential (primary) hypertension: Secondary | ICD-10-CM

## 2022-11-26 ENCOUNTER — Ambulatory Visit: Payer: BC Managed Care – PPO | Admitting: Pulmonary Disease
